# Patient Record
Sex: Female | Born: 1945
Health system: Southern US, Community
[De-identification: ages and names within clinical notes are randomized; demographics above are authoritative.]

## PROBLEM LIST (undated history)

## (undated) DIAGNOSIS — E7800 Pure hypercholesterolemia, unspecified: Secondary | ICD-10-CM

## (undated) HISTORY — DX: Pure hypercholesterolemia, unspecified: E78.00

## (undated) HISTORY — PX: APPENDECTOMY: SHX54

---

## 2013-08-31 HISTORY — PX: NECK SURGERY: SHX720

## 2014-07-11 DIAGNOSIS — Z8719 Personal history of other diseases of the digestive system: Secondary | ICD-10-CM

## 2014-07-11 HISTORY — DX: Personal history of other diseases of the digestive system: Z87.19

## 2015-05-29 HISTORY — PX: SPINE SURGERY: SHX786

## 2015-10-08 DIAGNOSIS — R5382 Chronic fatigue, unspecified: Secondary | ICD-10-CM

## 2015-10-08 DIAGNOSIS — I10 Essential (primary) hypertension: Secondary | ICD-10-CM

## 2015-10-08 DIAGNOSIS — E119 Type 2 diabetes mellitus without complications: Secondary | ICD-10-CM

## 2015-10-08 DIAGNOSIS — E559 Vitamin D deficiency, unspecified: Secondary | ICD-10-CM | POA: Insufficient documentation

## 2015-10-08 DIAGNOSIS — H409 Unspecified glaucoma: Secondary | ICD-10-CM

## 2015-10-08 DIAGNOSIS — F324 Major depressive disorder, single episode, in partial remission: Secondary | ICD-10-CM

## 2015-10-08 DIAGNOSIS — G4730 Sleep apnea, unspecified: Secondary | ICD-10-CM | POA: Insufficient documentation

## 2015-10-08 HISTORY — DX: Essential (primary) hypertension: I10

## 2015-10-08 HISTORY — DX: Major depressive disorder, single episode, in partial remission: F32.4

## 2015-10-08 HISTORY — DX: Unspecified glaucoma: H40.9

## 2015-10-08 HISTORY — DX: Vitamin D deficiency, unspecified: E55.9

## 2015-10-08 HISTORY — DX: Sleep apnea, unspecified: G47.30

## 2015-10-08 HISTORY — DX: Type 2 diabetes mellitus without complications: E11.9

## 2015-10-08 HISTORY — DX: Chronic fatigue, unspecified: R53.82

## 2016-10-23 DIAGNOSIS — R768 Other specified abnormal immunological findings in serum: Secondary | ICD-10-CM

## 2016-10-23 DIAGNOSIS — R002 Palpitations: Secondary | ICD-10-CM

## 2016-10-23 DIAGNOSIS — R9431 Abnormal electrocardiogram [ECG] [EKG]: Secondary | ICD-10-CM

## 2016-10-23 DIAGNOSIS — R7689 Other specified abnormal immunological findings in serum: Secondary | ICD-10-CM | POA: Insufficient documentation

## 2016-10-23 HISTORY — DX: Abnormal electrocardiogram (ECG) (EKG): R94.31

## 2016-10-23 HISTORY — DX: Other specified abnormal immunological findings in serum: R76.8

## 2016-10-23 HISTORY — DX: Palpitations: R00.2

## 2016-10-23 HISTORY — DX: Morbid (severe) obesity due to excess calories: E66.01

## 2016-11-10 DIAGNOSIS — E669 Obesity, unspecified: Secondary | ICD-10-CM

## 2016-11-10 HISTORY — DX: Obesity, unspecified: E66.9

## 2016-11-27 DIAGNOSIS — M170 Bilateral primary osteoarthritis of knee: Secondary | ICD-10-CM | POA: Insufficient documentation

## 2016-11-27 DIAGNOSIS — R700 Elevated erythrocyte sedimentation rate: Secondary | ICD-10-CM

## 2016-11-27 HISTORY — DX: Elevated erythrocyte sedimentation rate: R70.0

## 2016-11-27 HISTORY — DX: Bilateral primary osteoarthritis of knee: M17.0

## 2016-12-22 DIAGNOSIS — M25562 Pain in left knee: Secondary | ICD-10-CM

## 2016-12-22 HISTORY — DX: Pain in left knee: M25.562

## 2017-03-05 DIAGNOSIS — G4700 Insomnia, unspecified: Secondary | ICD-10-CM | POA: Insufficient documentation

## 2017-03-05 DIAGNOSIS — G4709 Other insomnia: Secondary | ICD-10-CM

## 2017-03-05 HISTORY — DX: Other insomnia: G47.09

## 2017-11-30 DIAGNOSIS — R339 Retention of urine, unspecified: Secondary | ICD-10-CM

## 2017-11-30 HISTORY — DX: Retention of urine, unspecified: R33.9

## 2018-12-14 ENCOUNTER — Telehealth: Payer: Self-pay | Admitting: Cardiology

## 2018-12-14 NOTE — Telephone Encounter (Signed)
Virtual Visit Pre-Appointment Phone Call    TELEPHONE CALL NOTE  Brenda NiemannCynthia Mcfarland has been deemed a candidate for a follow-up tele-health visit to limit community exposure during the Covid-19 pandemic. I spoke with the patient via phone to ensure availability of phone/video source, confirm preferred email & phone number, and discuss instructions and expectations.  I reminded Brenda NiemannCynthia Esbenshade to be prepared with any vital sign and/or heart rhythm information that could potentially be obtained via home monitoring, at the time of her visit. I reminded Brenda NiemannCynthia Gledhill to expect a phone call at the time of her visit if her visit.  Samella ParrDenise Wilson 12/14/2018 4:55 PM        FULL LENGTH CONSENT FOR TELE-HEALTH VISIT   I hereby voluntarily request, consent and authorize [facility] and its employed or contracted physicians, physician assistants, nurse practitioners or other licensed health care professionals (the Practitioner), to provide me with telemedicine health care services (the Services") as deemed necessary by the treating Practitioner. I acknowledge and consent to receive the Services by the Practitioner via telemedicine. I understand that the telemedicine visit will involve communicating with the Practitioner through live audiovisual communication technology and the disclosure of certain medical information by electronic transmission. I acknowledge that I have been given the opportunity to request an in-person assessment or other available alternative prior to the telemedicine visit and am voluntarily participating in the telemedicine visit.  I understand that I have the right to withhold or withdraw my consent to the use of telemedicine in the course of my care at any time, without affecting my right to future care or treatment, and that the Practitioner or I may terminate the telemedicine visit at any time. I understand that I have the right to inspect all information obtained and/or recorded  in the course of the telemedicine visit and may receive copies of available information for a reasonable fee.  I understand that some of the potential risks of receiving the Services via telemedicine include:   Delay or interruption in medical evaluation due to technological equipment failure or disruption;  Information transmitted may not be sufficient (e.g. poor resolution of images) to allow for appropriate medical decision making by the Practitioner; and/or   In rare instances, security protocols could fail, causing a breach of personal health information.  Furthermore, I acknowledge that it is my responsibility to provide information about my medical history, conditions and care that is complete and accurate to the best of my ability. I acknowledge that Practitioner's advice, recommendations, and/or decision may be based on factors not within their control, such as incomplete or inaccurate data provided by me or distortions of diagnostic images or specimens that may result from electronic transmissions. I understand that the practice of medicine is not an exact science and that Practitioner makes no warranties or guarantees regarding treatment outcomes. I acknowledge that I will receive a copy of this consent concurrently upon execution via email to the email address I last provided but may also request a printed copy by calling the office of [facility].    I understand that my insurance will be billed for this visit.   I have read or had this consent read to me.  I understand the contents of this consent, which adequately explains the benefits and risks of the Services being provided via telemedicine.   I have been provided ample opportunity to ask questions regarding this consent and the Services and have had my questions answered to my satisfaction.  I give my informed consent for the services to be provided through the use of telemedicine in my medical care  By participating in this  telemedicine visit I agree to the above.  YES      Cardiac Questionnaire:    Since your last visit or hospitalization:    1. Have you been having new or worsening chest pain? NO   2. Have you been having new or worsening shortness of breath? NO 3. Have you been having new or worsening leg swelling, wt gain, or increase in abdominal girth (pants fitting more tightly)? NO   4. Have you had any passing out spells? NO   *A YES to any of these questions would result in the appointment being kept. *If all the answers to these questions are NO, we should indicate that given the current situation regarding the worldwide coronarvirus pandemic, at the recommendation of the CDC, we are looking to limit gatherings in our waiting area, and thus will reschedule their appointment beyond four weeks from today.   _____________   COVID-19 Pre-Screening Questions:   Do you currently have a fever? NO (yes = cancel and refer to pcp for e-visit)  Have you recently travelled on a cruise, internationally, or to [place], [place], [place], [place], [place], or [place], [place] [employer]) ?NO(yes = cancel, stay home, monitor symptoms, and contact pcp or initiate e-visit if symptoms develop)  Have you been in contact with someone that is currently pending confirmation of Covid19 testing or has been confirmed to have the Covid19 virus?  NO (yes = cancel, stay home, away from tested individual, monitor symptoms, and contact pcp or initiate e-visit if symptoms develop)  Are you currently experiencing fatigue or cough? NO (yes = pt should be prepared to have a mask placed at the time of their visit).

## 2018-12-16 ENCOUNTER — Other Ambulatory Visit: Payer: Self-pay

## 2018-12-16 ENCOUNTER — Encounter: Payer: Self-pay | Admitting: Cardiology

## 2018-12-16 ENCOUNTER — Telehealth (INDEPENDENT_AMBULATORY_CARE_PROVIDER_SITE_OTHER): Payer: Medicare Other | Admitting: Cardiology

## 2018-12-16 DIAGNOSIS — R350 Frequency of micturition: Secondary | ICD-10-CM

## 2018-12-16 DIAGNOSIS — G4733 Obstructive sleep apnea (adult) (pediatric): Secondary | ICD-10-CM

## 2018-12-16 DIAGNOSIS — I10 Essential (primary) hypertension: Secondary | ICD-10-CM | POA: Diagnosis not present

## 2018-12-16 DIAGNOSIS — E119 Type 2 diabetes mellitus without complications: Secondary | ICD-10-CM | POA: Insufficient documentation

## 2018-12-16 HISTORY — DX: Obstructive sleep apnea (adult) (pediatric): G47.33

## 2018-12-16 HISTORY — DX: Frequency of micturition: R35.0

## 2018-12-16 HISTORY — DX: Type 2 diabetes mellitus without complications: E11.9

## 2018-12-16 NOTE — Progress Notes (Signed)
Virtual Visit via Video Note   This visit type was conducted due to national recommendations for restrictions regarding the COVID-19 Pandemic (e.g. social distancing) in an effort to limit this patient's exposure and mitigate transmission in our community.  Due to her co-morbid illnesses, this patient is at least at moderate risk for complications without adequate follow up.  This format is felt to be most appropriate for this patient at this time.  All issues noted in this document were discussed and addressed.  A limited physical exam was performed with this format.  Please refer to the patient's chart for her consent to telehealth for [facility].  Evaluation Performed:  Follow-up visit  This visit type was conducted due to national recommendations for restrictions regarding the COVID-19 Pandemic (e.g. social distancing).  This format is felt to be most appropriate for this patient at this time.  All issues noted in this document were discussed and addressed.  No physical exam was performed (except for noted visual exam findings with Video Visits).  Please refer to the patient's chart (MyChart message for video visits and phone note for telephone visits) for the patient's consent to telehealth for [facility]  Date:  12/16/2018  ID: Brenda Mcfarland, DOB 1946/07/05, MRN [id number]   Patient Location: [address] [place] [address]   Provider location:   [facility] [place] [facility]  PCP:  Laqueta Due., MD  Cardiologist:  Gypsy Balsam, MD     Chief Complaint: I have problem with my high blood pressure  History of Present Illness:    Brenda Mcfarland is a 73 y.o. female  who presents via audio/video conferencing for a telehealth visit today.  She has history of hypertension for years initially fairly easy controlled she was able to take some medication with some difficulties but blood pressure was controlled, however, lately blood pressure became difficult to control.  She  tried different kind of medication she was taking olmesartan with relatively good response however this medication started being difficult to obtain therefore she was switched to losartan and since that time she started having some issues she complained of having frequent urination and she thinks is related to losartan.  Then she was switched to lisinopril and similar situation continue she described to have frequent need to urinate.  That bothers her a lot she has to get up many times during the night cannot get good sleep.  She complained of feeling weak tired and exhausted and partially she thinks related to her blood pressure medications.  She reads a lot about this medication she read all side effects and she is worried about potential side effects of those medications.  She tried to do some essential oil and she said it works much better than medications. Had any heart trouble.  She used to be athletic however because of some surgeries that she suffer she cannot exercise on the regular basis now. Quit smoking many years ago.  Does have family history of premature coronary artery disease.   The patient does not have symptoms concerning for COVID-19 infection (fever, chills, cough, or new SHORTNESS OF BREATH).    Prior CV studies:   The following studies were reviewed today:       No past medical history on file.     No outpatient medications have been marked as taking for the 12/16/18 encounter (Telemedicine) with Georgeanna Lea, MD.      Family History: The patient's family history is not on  file.   ROS:   Please see the history of present illness.     All other systems reviewed and are negative.   Labs/Other Tests and Data Reviewed:     Recent Labs: No results found for requested labs within last 8760 hours.  Recent Lipid Panel No results found for: CHOL, TRIG, HDL, CHOLHDL, VLDL, LDLCALC, LDLDIRECT    Exam:    Vital Signs:  BP (!) 148/87   Pulse 92   Ht 5'  7" (1.702 m)   Wt 215 lb (97.5 kg)   BMI 33.67 kg/m     Wt Readings from Last 3 Encounters:  12/16/18 215 lb (97.5 kg)     Well nourished, well developed in no acute distress. On talking to her to the video link she is alert awake oriented x3 sitting in her living room no JVD no swelling of lower extremities quite happy to be able to talk to me.  Diagnosis for this visit:   1. Essential hypertension   2. Frequent urination   3. Obstructive sleep apnea   4. Type 2 diabetes mellitus without complication, without long-term current use of insulin (HCC)      ASSESSMENT & PLAN:    1.  Essential hypertension which appears to be somewhat difficult to control I suspect there was significant psychological factors there as well.  I told her simply to check her blood pressure for the next few days I asked her to do it 3 times a day.  I want her to sit down and relax and then check her blood pressure also told her not to check blood pressure first thing in the morning.  I we also spent a great deal of time talking about nonpharmacological ways to reduce her blood pressure.  We were talking about low-salt diet we talked about exercise we talked about weight loss with talking about taking care of her sleep apnea.  She is excited about that she is willing to try all those measures.  For now I asked her to to hold on lisinopril and she already stopped 2 days ago anyway check her blood pressure on regular basis and will continue this conversation at the beginning of next week. 2.  Frequent urination I doubt very much that this related to losartan or lisinopril however she is convinced that is what it is.  Again the plan will be to simply stop those medications and see how she does and see if she still have some problems. 3.  Obstructive sleep apnea she does not use mask she said she lost about 50 pounds in since that time she does not use mask however now she gained some of this weight back we will talk about  potentially starting back using CPAP mask she said the fact that she have to wake up so frequently during the night and urinate make her difficult to put the mask on.  Again this is discussion that we will continue 4.  Type 2 diabetes.  She is controlling this with essential oil. Difficult situation will try to help her the best we can.  I talked to her again next week.  COVID-19 Education: The signs and symptoms of COVID-19 were discussed with the patient and how to seek care for testing (follow up with PCP or arrange E-visit).  The importance of social distancing was discussed today.  Patient Risk:   After full review of this patients clinical status, I feel that they are at least moderate risk at  this time.  Time:   Today, I have spent 27 minutes with the patient with telehealth technology discussing pt health issues.  I spent reviewing her chart before the visit.  Visit was finished at 1:47 PM.    Medication Adjustments/Labs and Tests Ordered: Current medicines are reviewed at length with the patient today.  Concerns regarding medicines are outlined above.  No orders of the defined types were placed in this encounter.  Medication changes: No orders of the defined types were placed in this encounter.    Disposition: Follow-up next week  Signed, Georgeanna Lea, MD, [facility] 12/16/2018 3:06 PM    [facility]

## 2018-12-16 NOTE — Patient Instructions (Signed)
Medication Instructions:  Your physician recommends that you continue on your current medications as directed. Please refer to the Current Medication list given to you today.  If you need a refill on your cardiac medications before your next appointment, please call your pharmacy.   Lab work: None.  If you have labs (blood work) drawn today and your tests are completely normal, you will receive your results only by: Marland Kitchen MyChart Message (if you have MyChart) OR . A paper copy in the mail If you have any lab test that is abnormal or we need to change your treatment, we will call you to review the results.  Testing/Procedures: None.   Follow-Up: At [facility], you and your health needs are our priority.  As part of our continuing mission to provide you with exceptional heart care, we have created designated Provider Care Teams.  These Care Teams include your primary Cardiologist (physician) and Advanced Practice Providers (APPs -  Physician Assistants and Nurse Practitioners) who all work together to provide you with the care you need, when you need it. You will need a follow up appointment in 4 days.  Please call our office 2 months in advance to schedule this appointment.  You may see No primary care provider on file. or another member of our [employer] Provider Team in [place]: Norman Herrlich, MD . Belva Crome, MD  Any Other Special Instructions Will Be Listed Below (If Applicable).

## 2018-12-22 ENCOUNTER — Other Ambulatory Visit: Payer: Self-pay

## 2018-12-22 ENCOUNTER — Telehealth (INDEPENDENT_AMBULATORY_CARE_PROVIDER_SITE_OTHER): Payer: Medicare Other | Admitting: Cardiology

## 2018-12-22 ENCOUNTER — Encounter: Payer: Self-pay | Admitting: Cardiology

## 2018-12-22 VITALS — BP 153/87 | HR 94 | Wt 213.0 lb

## 2018-12-22 DIAGNOSIS — I10 Essential (primary) hypertension: Secondary | ICD-10-CM

## 2018-12-22 DIAGNOSIS — G4733 Obstructive sleep apnea (adult) (pediatric): Secondary | ICD-10-CM

## 2018-12-22 DIAGNOSIS — E119 Type 2 diabetes mellitus without complications: Secondary | ICD-10-CM

## 2018-12-22 DIAGNOSIS — R350 Frequency of micturition: Secondary | ICD-10-CM

## 2018-12-22 NOTE — Patient Instructions (Signed)
Medication Instructions: Your physician recommends that you continue on your current medications as directed. Please refer to the Current Medication list given to you today.  If you need a refill on your cardiac medications before your next appointment, please call your pharmacy.   Lab work: None If you have labs (blood work) drawn today and your tests are completely normal, you will receive your results only by: . MyChart Message (if you have MyChart) OR . A paper copy in the mail If you have any lab test that is abnormal or we need to change your treatment, we will call you to review the results.  Testing/Procedures: None  Follow-Up: At [facility], you and your health needs are our priority.  As part of our continuing mission to provide you with exceptional heart care, we have created designated Provider Care Teams.  These Care Teams include your primary Cardiologist (physician) and Advanced Practice Providers (APPs -  Physician Assistants and Nurse Practitioners) who all work together to provide you with the care you need, when you need it. You will need a follow up appointment in 2 weeks.   Any Other Special Instructions Will Be Listed Below (If Applicable).    

## 2018-12-22 NOTE — Progress Notes (Signed)
Virtual Visit via Video Note   This visit type was conducted due to national recommendations for restrictions regarding the COVID-19 Pandemic (e.g. social distancing) in an effort to limit this patient's exposure and mitigate transmission in our community.  Due to her co-morbid illnesses, this patient is at least at moderate risk for complications without adequate follow up.  This format is felt to be most appropriate for this patient at this time.  All issues noted in this document were discussed and addressed.  A limited physical exam was performed with this format.  Please refer to the patient's chart for her consent to telehealth for [facility].  Evaluation Performed:  Follow-up visit  This visit type was conducted due to national recommendations for restrictions regarding the COVID-19 Pandemic (e.g. social distancing).  This format is felt to be most appropriate for this patient at this time.  All issues noted in this document were discussed and addressed.  No physical exam was performed (except for noted visual exam findings with Video Visits).  Please refer to the patient's chart (MyChart message for video visits and phone note for telephone visits) for the patient's consent to telehealth for [facility].  Date:  12/22/2018  ID: Brenda Mcfarland, DOB December 18, 1945, MRN [id number]   Patient Location: [address] [place] [address]   Provider location:   [facility] [place] [facility]  PCP:  Laqueta Due., MD  Cardiologist:  Gypsy Balsam, MD     Chief Complaint: Doing fair  History of Present Illness:    Brenda Mcfarland is a 73 y.o. female  who presents via audio/video conferencing for a telehealth visit today.  Who was referred to me because of hypertension that appears to be difficult to control the issue however is much deeper I suspect there is some significant psychological factor here.  Last time what I did I asked her to stop lisinopril.  She told that lisinopril make  her to get up many times during the night and urinate she was convinced that this is what caused the trouble so I simply asked her to stop lisinopril and asked her to check her blood pressure.  She check her blood pressure on the regular basis and usual numbers are between 145 systolic 155.  1 time she got some strange mitigating her blood pressure was 109/98 I told her this is impossible number and I told her in the future if she has numbers like this she need to simply recheck it.  Last time we spent also a lot of time talking about nonpharmacological measures that can be used to reduce her blood pressure.  We were talking about low-salt we talked about exercises we talked about weight loss.  We also talked about relaxation technique.  She was very eager to try it.  And overall she said she feeling good however few days ago she ordered pizza and ate pizza for next 3 days since that time she is not feeling well.  She thinks this is infection of Candida that she had before that is acting up.  Complaining of being weak tired and exhausted.  Also initially when she stopped lisinopril she said she got very good nights and she could sleep all through the night with no difficulties she did not have to get up in the middle of night to urinate which confirmed her.  That her frequent urination was related to lisinopril.  Patient returned.  She thinks this is because of pizza as well  as the fact that she drank more fluids.  She also blamed the situation the fact that she does not use essential oils the way she should.  We beginning conversation about potentially starting some medication to reducing blood pressure.  She does not want to do it right now she said she will not stick more to the diet as well as using her essential oil to see if it helps with her blood pressure.  I am fine with this.  However I told her that we will contact again in about 2 weeks and continue to this conversation I am afraid at that time will have  to initiate some medication.   The patient does not have symptoms concerning for COVID-19 infection (fever, chills, cough, or new SHORTNESS OF BREATH).    Prior CV studies:   The following studies were reviewed today:       No past medical history on file.     Current Meds  Medication Sig  . lisinopril (ZESTRIL) 10 MG tablet Take 10 mg by mouth daily.  . temazepam (RESTORIL) 15 MG capsule Take 1 capsule by mouth at bedtime as needed for sleep.      Family History: The patient's family history is not on file.   ROS:   Please see the history of present illness.     All other systems reviewed and are negative.   Labs/Other Tests and Data Reviewed:     Recent Labs: No results found for requested labs within last 8760 hours.  Recent Lipid Panel No results found for: CHOL, TRIG, HDL, CHOLHDL, VLDL, LDLCALC, LDLDIRECT    Exam:    Vital Signs:  BP (!) 153/87   Pulse 94   Wt 213 lb (96.6 kg)   BMI 33.36 kg/m     Wt Readings from Last 3 Encounters:  12/22/18 213 lb (96.6 kg)  12/16/18 215 lb (97.5 kg)     Well nourished, well developed in no acute distress. Alert awake and at the time 3.  We talked to the video link.  She is happy to be able to talk to me she said she does not feel well today and she blames it on pizza that she ate.  There is no JVD there is no swelling of lower extremities  Diagnosis for this visit:   1. Essential hypertension   2. Type 2 diabetes mellitus without complication, without long-term current use of insulin (HCC)   3. Frequent urination   4. Obstructive sleep apnea      ASSESSMENT & PLAN:    1.  Essential hypertension: Discussion as above. 2.  Type 2 diabetes followed by primary care physician apparently lately better control. 3.  Frequent urination she thinks is related to lisinopril I am not sure if this is the reason for it.  Lisinopril has been discontinued and she said that she is feeling better from that aspect. 4.   Obstructive sleep apnea we had a very conversation about this and I will try to convince her that she will use or her CPAP mask the way she should.  COVID-19 Education: The signs and symptoms of COVID-19 were discussed with the patient and how to seek care for testing (follow up with PCP or arrange E-visit).  The importance of social distancing was discussed today.  Patient Risk:   After full review of this patients clinical status, I feel that they are at least moderate risk at this time.  Time:   Today, I have  spent 16 minutes with the patient with telehealth technology discussing pt health issues.  I spent 5 minutes reviewing her chart before the visit.  Visit was finished at 11:24 AM.    Medication Adjustments/Labs and Tests Ordered: Current medicines are reviewed at length with the patient today.  Concerns regarding medicines are outlined above.  No orders of the defined types were placed in this encounter.  Medication changes: No orders of the defined types were placed in this encounter.    Disposition: Follow-up in 2 weeks  Signed, Georgeanna Lea, MD, [facility] 12/22/2018 11:26 AM    [facility]

## 2019-01-06 ENCOUNTER — Other Ambulatory Visit: Payer: Self-pay

## 2019-01-06 ENCOUNTER — Encounter: Payer: Self-pay | Admitting: Cardiology

## 2019-01-06 ENCOUNTER — Telehealth (INDEPENDENT_AMBULATORY_CARE_PROVIDER_SITE_OTHER): Payer: Medicare Other | Admitting: Cardiology

## 2019-01-06 VITALS — BP 155/92 | HR 92

## 2019-01-06 DIAGNOSIS — I10 Essential (primary) hypertension: Secondary | ICD-10-CM | POA: Diagnosis not present

## 2019-01-06 DIAGNOSIS — R350 Frequency of micturition: Secondary | ICD-10-CM

## 2019-01-06 DIAGNOSIS — E119 Type 2 diabetes mellitus without complications: Secondary | ICD-10-CM | POA: Diagnosis not present

## 2019-01-06 MED ORDER — AMLODIPINE BESYLATE 2.5 MG PO TABS: 2.5000 mg | ORAL_TABLET | Freq: Every day | ORAL | 1 refills | Status: DC

## 2019-01-06 NOTE — Progress Notes (Signed)
Virtual Visit via Video Note   This visit type was conducted due to national recommendations for restrictions regarding the COVID-19 Pandemic (e.g. social distancing) in an effort to limit this patient's exposure and mitigate transmission in our community.  Due to her co-morbid illnesses, this patient is at least at moderate risk for complications without adequate follow up.  This format is felt to be most appropriate for this patient at this time.  All issues noted in this document were discussed and addressed.  A limited physical exam was performed with this format.  Please refer to the patient's chart for her consent to telehealth for [facility].  Evaluation Performed:  Follow-up visit  This visit type was conducted due to national recommendations for restrictions regarding the COVID-19 Pandemic (e.g. social distancing).  This format is felt to be most appropriate for this patient at this time.  All issues noted in this document were discussed and addressed.  No physical exam was performed (except for noted visual exam findings with Video Visits).  Please refer to the patient's chart (MyChart message for video visits and phone note for telephone visits) for the patient's consent to telehealth for [facility].  Date:  01/06/2019  ID: Brenda Mcfarland, DOB 30-May-1946, MRN [id number]   Patient Location: [address] [place] [address]   Provider location:   [facility] [place] [facility]  PCP:  Laqueta Due., MD  Cardiologist:  Gypsy Balsam, MD     Chief Complaint: I am weak and tired  History of Present Illness:    Brenda Mcfarland is a 73 y.o. female  who presents via audio/video conferencing for a telehealth visit today.  With hypertension which is difficult to control she does have difficult time to tolerating medications.  Denies having any chest pain, tightness, pressure, burning, squeezing in the chest just fatigue and tired.  She said she wakes up in the morning tired but  force herself to get up and work.  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  Yesterday she said she did not do much.  Still blood pressure is elevated systolic typically 150 there were a few times that she measured with her high blood pressure.  Clearly she needs to be on medications.  Finally she today agreed to take some medication I put her on Norvasc amlodipine 2.5 mg daily and see how she can tolerate it.   The patient does not have symptoms concerning for COVID-19 infection (fever, chills, cough, or new SHORTNESS OF BREATH).    Prior CV studies:   The following studies were reviewed today:       No past medical history on file.     Current Meds  Medication Sig  . lisinopril (ZESTRIL) 10 MG tablet Take 10 mg by mouth daily.  . temazepam (RESTORIL) 15 MG capsule Take 1 capsule by mouth at bedtime as needed for sleep.      Family History: The patient's family history is not on file.   ROS:   Please see the history of present illness.     All other systems reviewed and are negative.   Labs/Other Tests and Data Reviewed:     Recent Labs: No results found for requested labs within last 8760 hours.  Recent Lipid Panel No results found for: CHOL, TRIG, HDL, CHOLHDL, VLDL, LDLCALC, LDLDIRECT    Exam:    Vital Signs:  BP (!) 155/92   Pulse 92  Wt Readings from Last 3 Encounters:  12/22/18 213 lb (96.6 kg)  12/16/18 215 lb (97.5 kg)     Well nourished, well developed in no acute distress. Alert awake oriented x3 and again we had a long conversation over the video link.  I am worried that she may be somewhat depressed and that is why she got lack of energy.  She will be scheduled to have echocardiogram to assess left ventricular ejection fraction.  Diagnosis for this visit:   1. Essential hypertension   2. Type 2 diabetes mellitus without complication, without long-term current use of insulin (HCC)   3. Frequent urination       ASSESSMENT & PLAN:    1.  Essential hypertension she agreed to try small dose of amlodipine which will be only 2.5 mg daily. 2.  Fatigue and tiredness.  Echocardiogram will be done to assess left ventricular ejection fraction. 3.  Type 2 diabetes apparently stable. 4.  Frequent urination.  Doing much better from that point of view still have difficulty sleeping at night which is a chronic problem she does have chronic insomnia.  COVID-19 Education: The signs and symptoms of COVID-19 were discussed with the patient and how to seek care for testing (follow up with PCP or arrange E-visit).  The importance of social distancing was discussed today.  Patient Risk:   After full review of this patients clinical status, I feel that they are at least moderate risk at this time.  Time:   Today, I have spent 20 minutes with the patient with telehealth technology discussing pt health issues.  I spent 5 minutes reviewing her chart before the visit.  Visit was finished at 2:31 PM.    Medication Adjustments/Labs and Tests Ordered: Current medicines are reviewed at length with the patient today.  Concerns regarding medicines are outlined above.  No orders of the defined types were placed in this encounter.  Medication changes: No orders of the defined types were placed in this encounter.    Disposition: Follow-up in 3 weeks.  Will do echocardiogram  Signed, Georgeanna Leaobert J. Cooper Moroney, MD, [facility] 01/06/2019 2:31 PM    [facility]

## 2019-01-06 NOTE — Patient Instructions (Signed)
Medication Instructions:  Your physician has recommended you make the following change in your medication:   START: Amlodipine 2.5 mg daily  If you need a refill on your cardiac medications before your next appointment, please call your pharmacy.   Lab work: None. If you have labs (blood work) drawn today and your tests are completely normal, you will receive your results only by: Marland Kitchen MyChart Message (if you have MyChart) OR . A paper copy in the mail If you have any lab test that is abnormal or we need to change your treatment, we will call you to review the results.  Testing/Procedures: Your physician has requested that you have an echocardiogram. Echocardiography is a painless test that uses sound waves to create images of your heart. It provides your doctor with information about the size and shape of your heart and how well your heart's chambers and valves are working. This procedure takes approximately one hour. There are no restrictions for this procedure.    Follow-Up: At [facility], you and your health needs are our priority.  As part of our continuing mission to provide you with exceptional heart care, we have created designated Provider Care Teams.  These Care Teams include your primary Cardiologist (physician) and Advanced Practice Providers (APPs -  Physician Assistants and Nurse Practitioners) who all work together to provide you with the care you need, when you need it. You will need a follow up appointment in 3 weeks.  Please call our office 2 months in advance to schedule this appointment.  You may see No primary care provider on file. or another member of our [employer] Provider Team in [place]: Norman Herrlich, MD . Belva Crome, MD  Any Other Special Instructions Will Be Listed Below (If Applicable).  Amlodipine tablets What is this medicine? AMLODIPINE (am LOE di peen) is a calcium-channel blocker. It affects the amount of calcium found in your heart and  muscle cells. This relaxes your blood vessels, which can reduce the amount of work the heart has to do. This medicine is used to lower high blood pressure. It is also used to prevent chest pain. This medicine may be used for other purposes; ask your health care provider or pharmacist if you have questions. COMMON BRAND NAME(S): Norvasc What should I tell my health care provider before I take this medicine? They need to know if you have any of these conditions: -heart disease -liver disease -an unusual or allergic reaction to amlodipine, other medicines, foods, dyes, or preservatives -pregnant or trying to get pregnant -breast-feeding How should I use this medicine? Take this medicine by mouth with a glass of water. Follow the directions on the prescription label. You can take it with or without food. If it upsets your stomach, take it with food. Take your medicine at regular intervals. Do not take it more often than directed. Do not stop taking except on your doctor's advice. Talk to your pediatrician regarding the use of this medicine in children. While this drug may be prescribed for children as young as 6 years for selected conditions, precautions do apply. Patients over 69 years of age may have a stronger reaction and need a smaller dose. Overdosage: If you think you have taken too much of this medicine contact a poison control center or emergency room at once. NOTE: This medicine is only for you. Do not share this medicine with others. What if I miss a dose? If you miss a dose, take it as soon  as you can. If it is almost time for your next dose, take only that dose. Do not take double or extra doses. What may interact with this medicine? Do not take this medicine with any of the following medications: -tranylcypromine This medicine may also interact with the following medications: -clarithromycin -cyclosporine -diltiazem -itraconazole -simvastatin -tacrolimus This list may not  describe all possible interactions. Give your health care provider a list of all the medicines, herbs, non-prescription drugs, or dietary supplements you use. Also tell them if you smoke, drink alcohol, or use illegal drugs. Some items may interact with your medicine. What should I watch for while using this medicine? Visit your healthcare professional for regular checks on your progress. Check your blood pressure as directed. Ask your healthcare professional what your blood pressure should be and when you should contact him or her. Do not treat yourself for coughs, colds, or pain while you are using this medicine without asking your healthcare professional for advice. Some medicines may increase your blood pressure. You may get dizzy. Do not drive, use machinery, or do anything that needs mental alertness until you know how this medicine affects you. Do not stand or sit up quickly, especially if you are an older patient. This reduces the risk of dizzy or fainting spells. Avoid alcoholic drinks; they can make you dizzier. What side effects may I notice from receiving this medicine? Side effects that you should report to your doctor or health care professional as soon as possible: -allergic reactions like skin rash, itching or hives; swelling of the face, lips, or tongue -fast, irregular heartbeat -signs and symptoms of low blood pressure like dizziness; feeling faint or lightheaded, falls; unusually weak or tired -swelling of ankles, feet, hands Side effects that usually do not require medical attention (report these to your doctor or health care professional if they continue or are bothersome): -dry mouth -facial flushing -headache -stomach pain -tiredness This list may not describe all possible side effects. Call your doctor for medical advice about side effects. You may report side effects to FDA at 1-800-FDA-1088. Where should I keep my medicine? Keep out of the reach of children. Store at  room temperature between 59 and 86 degrees F (15 and 30 degrees C). Throw away any unused medicine after the expiration date. NOTE: This sheet is a summary. It may not cover all possible information. If you have questions about this medicine, talk to your doctor, pharmacist, or health care provider.  2019 Elsevier/Gold Standard (2018-03-11 15:07:10)   Echocardiogram An echocardiogram is a procedure that uses painless sound waves (ultrasound) to produce an image of the heart. Images from an echocardiogram can provide important information about:  Signs of coronary artery disease (CAD).  Aneurysm detection. An aneurysm is a weak or damaged part of an artery wall that bulges out from the normal force of blood pumping through the body.  Heart size and shape. Changes in the size or shape of the heart can be associated with certain conditions, including heart failure, aneurysm, and CAD.  Heart muscle function.  Heart valve function.  Signs of a past heart attack.  Fluid buildup around the heart.  Thickening of the heart muscle.  A tumor or infectious growth around the heart valves. Tell a health care provider about:  Any allergies you have.  All medicines you are taking, including vitamins, herbs, eye drops, creams, and over-the-counter medicines.  Any blood disorders you have.  Any surgeries you have had.  Any medical conditions  you have.  Whether you are pregnant or may be pregnant. What are the risks? Generally, this is a safe procedure. However, problems may occur, including:  Allergic reaction to dye (contrast) that may be used during the procedure. What happens before the procedure? No specific preparation is needed. You may eat and drink normally. What happens during the procedure?   An IV tube may be inserted into one of your veins.  You may receive contrast through this tube. A contrast is an injection that improves the quality of the pictures from your heart.   A gel will be applied to your chest.  A wand-like tool (transducer) will be moved over your chest. The gel will help to transmit the sound waves from the transducer.  The sound waves will harmlessly bounce off of your heart to allow the heart images to be captured in real-time motion. The images will be recorded on a computer. The procedure may vary among health care providers and hospitals. What happens after the procedure?  You may return to your normal, everyday life, including diet, activities, and medicines, unless your health care provider tells you not to do that. Summary  An echocardiogram is a procedure that uses painless sound waves (ultrasound) to produce an image of the heart.  Images from an echocardiogram can provide important information about the size and shape of your heart, heart muscle function, heart valve function, and fluid buildup around your heart.  You do not need to do anything to prepare before this procedure. You may eat and drink normally.  After the echocardiogram is completed, you may return to your normal, everyday life, unless your health care provider tells you not to do that. This information is not intended to replace advice given to you by your health care provider. Make sure you discuss any questions you have with your health care provider. Document Released: 08/14/2000 Document Revised: 09/19/2016 Document Reviewed: 09/19/2016 Elsevier Interactive Patient Education  2019 Elsevier Inc.   Amlodipine tablets What is this medicine? AMLODIPINE (am LOE di peen) is a calcium-channel blocker. It affects the amount of calcium found in your heart and muscle cells. This relaxes your blood vessels, which can reduce the amount of work the heart has to do. This medicine is used to lower high blood pressure. It is also used to prevent chest pain. This medicine may be used for other purposes; ask your health care provider or pharmacist if you have questions. COMMON  BRAND NAME(S): Norvasc What should I tell my health care provider before I take this medicine? They need to know if you have any of these conditions: -heart disease -liver disease -an unusual or allergic reaction to amlodipine, other medicines, foods, dyes, or preservatives -pregnant or trying to get pregnant -breast-feeding How should I use this medicine? Take this medicine by mouth with a glass of water. Follow the directions on the prescription label. You can take it with or without food. If it upsets your stomach, take it with food. Take your medicine at regular intervals. Do not take it more often than directed. Do not stop taking except on your doctor's advice. Talk to your pediatrician regarding the use of this medicine in children. While this drug may be prescribed for children as young as 6 years for selected conditions, precautions do apply. Patients over 74 years of age may have a stronger reaction and need a smaller dose. Overdosage: If you think you have taken too much of this medicine contact a poison control  center or emergency room at once. NOTE: This medicine is only for you. Do not share this medicine with others. What if I miss a dose? If you miss a dose, take it as soon as you can. If it is almost time for your next dose, take only that dose. Do not take double or extra doses. What may interact with this medicine? Do not take this medicine with any of the following medications: -tranylcypromine This medicine may also interact with the following medications: -clarithromycin -cyclosporine -diltiazem -itraconazole -simvastatin -tacrolimus This list may not describe all possible interactions. Give your health care provider a list of all the medicines, herbs, non-prescription drugs, or dietary supplements you use. Also tell them if you smoke, drink alcohol, or use illegal drugs. Some items may interact with your medicine. What should I watch for while using this medicine?  Visit your healthcare professional for regular checks on your progress. Check your blood pressure as directed. Ask your healthcare professional what your blood pressure should be and when you should contact him or her. Do not treat yourself for coughs, colds, or pain while you are using this medicine without asking your healthcare professional for advice. Some medicines may increase your blood pressure. You may get dizzy. Do not drive, use machinery, or do anything that needs mental alertness until you know how this medicine affects you. Do not stand or sit up quickly, especially if you are an older patient. This reduces the risk of dizzy or fainting spells. Avoid alcoholic drinks; they can make you dizzier. What side effects may I notice from receiving this medicine? Side effects that you should report to your doctor or health care professional as soon as possible: -allergic reactions like skin rash, itching or hives; swelling of the face, lips, or tongue -fast, irregular heartbeat -signs and symptoms of low blood pressure like dizziness; feeling faint or lightheaded, falls; unusually weak or tired -swelling of ankles, feet, hands Side effects that usually do not require medical attention (report these to your doctor or health care professional if they continue or are bothersome): -dry mouth -facial flushing -headache -stomach pain -tiredness This list may not describe all possible side effects. Call your doctor for medical advice about side effects. You may report side effects to FDA at 1-800-FDA-1088. Where should I keep my medicine? Keep out of the reach of children. Store at room temperature between 59 and 86 degrees F (15 and 30 degrees C). Throw away any unused medicine after the expiration date. NOTE: This sheet is a summary. It may not cover all possible information. If you have questions about this medicine, talk to your doctor, pharmacist, or health care provider.  2019  Elsevier/Gold Standard (2018-03-11 15:07:10)

## 2019-01-06 NOTE — Addendum Note (Signed)
Addended by: Lita Mains on: 01/06/2019 02:49 PM   Modules accepted: Orders

## 2019-01-10 ENCOUNTER — Ambulatory Visit: Payer: BC Managed Care – PPO | Admitting: Cardiology

## 2019-01-11 ENCOUNTER — Telehealth: Payer: Self-pay | Admitting: Cardiology

## 2019-01-11 NOTE — Telephone Encounter (Signed)
Patient reports feeling strange today. She feels dizzy, trembly on the inside, she can feel her heart beating in her face and  like she can't get a deep breath. Her neck and chest feel tight for 1 1/2  hours. She denies edema, BP now 143/89, HR 96, weight 217#. BP earlier today 151/90, HR 98.     She resumed norvasc 2.5mg  3 days ago, otherwise is on no other cardiac meds.     Recommended that she go to Urgent Care for EKG but she stated this might be anxiety.   pls advise, tx

## 2019-01-11 NOTE — Telephone Encounter (Signed)
Please stay on norvasc if she can. Try relaxation techniques, walk around, listen to music and see if that will continue. Please ask her to keep [place] updated

## 2019-01-11 NOTE — Telephone Encounter (Signed)
Phoned patient, informed that Dr. Bing Matter would like her to stay on the norvasc, he'd like her to try relaxation and stress reduction techniques as mentioned above. Patient agrees and will call us to update how she's doing as needed.

## 2019-01-11 NOTE — Telephone Encounter (Signed)
Brenda Mcfarland 1946-03-17 -- is not feeling well, feels shaky, unstable, chest pressure, and dizzy

## 2019-01-24 ENCOUNTER — Encounter: Payer: Self-pay | Admitting: Cardiology

## 2019-01-24 ENCOUNTER — Other Ambulatory Visit: Payer: Self-pay

## 2019-01-24 ENCOUNTER — Telehealth (INDEPENDENT_AMBULATORY_CARE_PROVIDER_SITE_OTHER): Payer: Medicare Other | Admitting: Cardiology

## 2019-01-24 VITALS — BP 157/81 | HR 88 | Wt 213.0 lb

## 2019-01-24 DIAGNOSIS — E119 Type 2 diabetes mellitus without complications: Secondary | ICD-10-CM | POA: Diagnosis not present

## 2019-01-24 DIAGNOSIS — I10 Essential (primary) hypertension: Secondary | ICD-10-CM | POA: Diagnosis not present

## 2019-01-24 DIAGNOSIS — N951 Menopausal and female climacteric states: Secondary | ICD-10-CM

## 2019-01-24 DIAGNOSIS — R350 Frequency of micturition: Secondary | ICD-10-CM

## 2019-01-24 DIAGNOSIS — R002 Palpitations: Secondary | ICD-10-CM

## 2019-01-24 MED ORDER — AMLODIPINE BESYLATE 5 MG PO TABS: 5.0000 mg | ORAL_TABLET | Freq: Every day | ORAL | 1 refills | Status: DC

## 2019-01-24 NOTE — Patient Instructions (Signed)
Medication Instructions:  Your physician has recommended you make the following change in your medication:   Increase: amlodipine to 5 mg daily   If you need a refill on your cardiac medications before your next appointment, please call your pharmacy.   Lab work: None.  If you have labs (blood work) drawn today and your tests are completely normal, you will receive your results only by: Marland Kitchen MyChart Message (if you have MyChart) OR . A paper copy in the mail If you have any lab test that is abnormal or we need to change your treatment, we will call you to review the results.  Testing/Procedures: Your physician has recommended that you wear a holter monitor. Holter monitors are medical devices that record the heart's electrical activity. Doctors most often use these monitors to diagnose arrhythmias. Arrhythmias are problems with the speed or rhythm of the heartbeat. The monitor is a small, portable device. You can wear one while you do your normal daily activities. This is usually used to diagnose what is causing palpitations/syncope (passing out). Wear for 7 days.    Follow-Up: At [facility], you and your health needs are our priority.  As part of our continuing mission to provide you with exceptional heart care, we have created designated Provider Care Teams.  These Care Teams include your primary Cardiologist (physician) and Advanced Practice Providers (APPs -  Physician Assistants and Nurse Practitioners) who all work together to provide you with the care you need, when you need it. You will need a follow up appointment in 1 months.  Please call our office 2 months in advance to schedule this appointment.  You may see No primary care provider on file. or another member of our [employer] Provider Team in [place]: Norman Herrlich, MD . Belva Crome, MD  Any Other Special Instructions Will Be Listed Below (If Applicable).

## 2019-01-24 NOTE — Progress Notes (Signed)
Virtual Visit via Video Note   This visit type was conducted due to national recommendations for restrictions regarding the COVID-19 Pandemic (e.g. social distancing) in an effort to limit this patient's exposure and mitigate transmission in our community.  Due to her co-morbid illnesses, this patient is at least at moderate risk for complications without adequate follow up.  This format is felt to be most appropriate for this patient at this time.  All issues noted in this document were discussed and addressed.  A limited physical exam was performed with this format.  Please refer to the patient's chart for her consent to telehealth for [facility].  Evaluation Performed:  Follow-up visit  This visit type was conducted due to national recommendations for restrictions regarding the COVID-19 Pandemic (e.g. social distancing).  This format is felt to be most appropriate for this patient at this time.  All issues noted in this document were discussed and addressed.  No physical exam was performed (except for noted visual exam findings with Video Visits).  Please refer to the patient's chart (MyChart message for video visits and phone note for telephone visits) for the patient's consent to telehealth for [facility].  Date:  01/24/2019  ID: Brenda Mcfarland, DOB 1946/02/24, MRN [id number]   Patient Location: [address] [place] [address]   Provider location:   [facility] [place] [facility]  PCP:  Laqueta Due., MD  Cardiologist:  Gypsy Balsam, MD     Chief Complaint: Cardiomegaly high blood pressure,  History of Present Illness:    Brenda Mcfarland is a 73 y.o. female  who presents via audio/video conferencing for a telehealth visit today.  She does have hypertension, obstructive sleep apnea, complain of being fatigued tired and exhausted.  She blames a lot of her symptoms on medication however likely I was able to perform 2.5 mg of amlodipine and she tolerated this well there is no  significant changes in her blood pressure and I told her after 2.5 mg amlodipine unlikely to have significant change in her blood pressure she understand this.  We had a long discussion today about a lot of issues with talk about healthy lifestyle need to exercise on a regular basis and she promised me that she start walking on a regular basis.  I told her to walk 10 minutes in the morning 10 minutes afternoon and gradually increase the distance and duration.  She also has significant increased dose of amlodipine.  We will go from 2.5-5.  We agree H need to keep good diet with low-salt diet she understands she will try to do that.  She also described to have episodes of hot flashes and she is questioning me about that I did have no good answer she said she got postmenopausal at age of 73 and never had a big problem with this sadly she still having those hot flashes.  I told her there is a role probably for her to see gynecologist she said she does not like to go to the doctor however she would like me to find somebody that I recommend.  We will try to investigate. Also describes situation that she will feel her heart speeding up with no particular reason I will ask her to wear monitor to clarify that.  She is also due to have echocardiogram  The patient does not have symptoms concerning for COVID-19 infection (fever, chills, cough, or new SHORTNESS OF BREATH).    Prior CV studies:   The  following studies were reviewed today:       No past medical history on file.     Current Meds  Medication Sig  . amLODipine (NORVASC) 2.5 MG tablet Take 1 tablet (2.5 mg total) by mouth daily.  Marland Kitchen. lisinopril (ZESTRIL) 10 MG tablet Take 10 mg by mouth daily.  . temazepam (RESTORIL) 15 MG capsule Take 1 capsule by mouth at bedtime as needed for sleep.      Family History: The patient's family history is not on file.   ROS:   Please see the history of present illness.     All other systems reviewed and  are negative.   Labs/Other Tests and Data Reviewed:     Recent Labs: No results found for requested labs within last 8760 hours.  Recent Lipid Panel No results found for: CHOL, TRIG, HDL, CHOLHDL, VLDL, LDLCALC, LDLDIRECT    Exam:    Vital Signs:  BP (!) 157/81   Pulse 88   Wt 213 lb (96.6 kg)   BMI 33.36 kg/m     Wt Readings from Last 3 Encounters:  01/24/19 213 lb (96.6 kg)  12/22/18 213 lb (96.6 kg)  12/16/18 215 lb (97.5 kg)     Well nourished, well developed in no acute distress. Alert awake oriented x3 long conversation through the video link.  She looks not in any distress she is with her dog named Corporate investment bankerCody.  Diagnosis for this visit:   1. Essential hypertension   2. Type 2 diabetes mellitus without complication, without long-term current use of insulin (HCC)   3. Frequent urination      ASSESSMENT & PLAN:    1.  Essential hypertension is not well controlled yet will increase dose of amlodipine from 2.5 to 5 mg daily.  We will continue checking her blood pressure. 2.  Diabetes followed by anti-medicine team 3.  Frequent elevation still describe that phenomenon. 4.  Hot flashes I recommend to see gynecologist. 5.  Palpitations we will schedule her to have monitor.  COVID-19 Education: The signs and symptoms of COVID-19 were discussed with the patient and how to seek care for testing (follow up with PCP or arrange E-visit).  The importance of social distancing was discussed today.  Patient Risk:   After full review of this patients clinical status, I feel that they are at least moderate risk at this time.  Time:   Today, I have spent 26 minutes with the patient with telehealth technology discussing pt health issues.  I spent 5 minutes reviewing her chart before the visit.  Visit was finished at 11:46 AM.    Medication Adjustments/Labs and Tests Ordered: Current medicines are reviewed at length with the patient today.  Concerns regarding medicines are  outlined above.  No orders of the defined types were placed in this encounter.  Medication changes: No orders of the defined types were placed in this encounter.    Disposition: Follow-up in 1 month  Signed, Georgeanna Leaobert J. Janean Eischen, MD, [facility] 01/24/2019 11:45 AM    [facility]

## 2019-01-31 ENCOUNTER — Ambulatory Visit (HOSPITAL_BASED_OUTPATIENT_CLINIC_OR_DEPARTMENT_OTHER)
Admission: RE | Admit: 2019-01-31 | Discharge: 2019-01-31 | Disposition: A | Discharge status: Home / Self Care | Payer: Medicare Other | Source: Ambulatory Visit | Attending: Cardiology | Admitting: Cardiology

## 2019-01-31 ENCOUNTER — Other Ambulatory Visit: Payer: Self-pay

## 2019-01-31 DIAGNOSIS — I10 Essential (primary) hypertension: Secondary | ICD-10-CM | POA: Insufficient documentation

## 2019-01-31 NOTE — Progress Notes (Signed)
*  PRELIMINARY RESULTS* 2D Echocardiogram has been performed.  Brenda Mcfarland 01/31/2019, 11:58 AM

## 2019-02-02 ENCOUNTER — Other Ambulatory Visit (INDEPENDENT_AMBULATORY_CARE_PROVIDER_SITE_OTHER): Payer: Medicare Other

## 2019-02-02 DIAGNOSIS — R002 Palpitations: Secondary | ICD-10-CM | POA: Diagnosis not present

## 2019-02-08 ENCOUNTER — Telehealth: Payer: Self-pay | Admitting: *Deleted

## 2019-02-08 NOTE — Telephone Encounter (Signed)
Pt called to find out how long to wear monitor. Let her know it is for 7 days. She says it is itchy and irritating and has been 6 or 7 days so I let her know to go ahead and mail it back.

## 2019-02-28 ENCOUNTER — Telehealth (INDEPENDENT_AMBULATORY_CARE_PROVIDER_SITE_OTHER): Payer: Medicare Other | Admitting: Cardiology

## 2019-02-28 ENCOUNTER — Other Ambulatory Visit: Payer: Self-pay

## 2019-02-28 ENCOUNTER — Encounter: Payer: Self-pay | Admitting: Cardiology

## 2019-02-28 VITALS — BP 130/87 | HR 85

## 2019-02-28 DIAGNOSIS — E119 Type 2 diabetes mellitus without complications: Secondary | ICD-10-CM | POA: Diagnosis not present

## 2019-02-28 DIAGNOSIS — I10 Essential (primary) hypertension: Secondary | ICD-10-CM | POA: Diagnosis not present

## 2019-02-28 DIAGNOSIS — G4733 Obstructive sleep apnea (adult) (pediatric): Secondary | ICD-10-CM | POA: Diagnosis not present

## 2019-02-28 MED ORDER — DILTIAZEM HCL ER COATED BEADS 120 MG PO CP24: 120.0000 mg | ORAL_CAPSULE | Freq: Every day | ORAL | 1 refills | Status: DC

## 2019-02-28 NOTE — Progress Notes (Signed)
Virtual Visit via Video Note   This visit type was conducted due to national recommendations for restrictions regarding the COVID-19 Pandemic (e.g. social distancing) in an effort to limit this patient's exposure and mitigate transmission in our community.  Due to her co-morbid illnesses, this patient is at least at moderate risk for complications without adequate follow up.  This format is felt to be most appropriate for this patient at this time.  All issues noted in this document were discussed and addressed.  A limited physical exam was performed with this format.  Please refer to the patient's chart for her consent to telehealth for [facility].  Evaluation Performed:  Follow-up visit  This visit type was conducted due to national recommendations for restrictions regarding the COVID-19 Pandemic (e.g. social distancing).  This format is felt to be most appropriate for this patient at this time.  All issues noted in this document were discussed and addressed.  No physical exam was performed (except for noted visual exam findings with Video Visits).  Please refer to the patient's chart (MyChart message for video visits and phone note for telephone visits) for the patient's consent to telehealth for [facility].  Date:  02/28/2019  ID: Brenda Mcfarland, DOB [birth year], MRN [id number]   Patient Location: [address] [place] [address]   Provider location:   [place] Office  PCP:  Karleen Hampshire., MD  Cardiologist:  Jenne Campus, MD     Chief Complaint: I am doing fine  History of Present Illness:    Brenda Mcfarland is a 73 y.o. female  who presents via audio/video conferencing for a telehealth visit today.  With hypertension which appears to be difficult to control because of intolerance to multiple medications.  Last time I put him very small dose of amlodipine only 2.5 mg daily just to check make sure she tolerated medication and everything was fine however blood  pressure was still elevated, therefore, we increased dose up to 5 mg daily I warned her about potential side effect of swelling of lower extremities she is unsure when she developed this and she decided to stop that medication.  She reports blood pressure in the morning 130/80 however in the afternoon 150-160/90.  I did echocardiogram try to see if there are any sequela of hypertension and sure enough she does have left ventricle hypertrophy with intraventricular septum and posterior wall measuring 1.4 cm.  Therefore I think we need to really control blood pressure better.  She also wear event recorder which showed some narrow complex tachycardia.  We talked again in length about nonconventional way to manage blood pressure that she wants to do.  She admits that she does not do much because of chronic back problem.  She agreed to try different calcium channel blocker, I will ask her to start taking Cardizem CD 120 to see if we can control narrow complex tachycardia as well as improve her blood pressure control.  She described to have some palpitations that she feels and hopefully with Cardizem will be able to control it better. Overall she is very happy and cheerful today.  Recently she participated in a wedding on her grandchild.  It was big event that she enjoyed this tremendously.   The patient does not have symptoms concerning for COVID-19 infection (fever, chills, cough, or new SHORTNESS OF BREATH).    Prior CV studies:   The following studies were reviewed today:  Echocardiogram done on 01/31/2019 showed:  1. The left ventricle has normal systolic function with an ejection fraction of 60-65%. The cavity size was normal. There is moderate concentric left ventricular hypertrophy. Left ventricular diastolic Doppler parameters are consistent with impaired  relaxation.  2. The right ventricle has normal systolic function. The cavity was normal. There is no increase in right ventricular wall thickness.   3. No evidence of mitral valve stenosis.  4. The aortic valve is tricuspid. No stenosis of the aortic valve.  5. The ascending aorta, aortic root and aortic arch are normal in size and structure.  Event monitor done on 02/18/2019 showed:  Conclusion:  Narrow complex tachycardias the longest episode lasting 32 seconds.  Asymptomatic    No past medical history on file.     Current Meds  Medication Sig  . temazepam (RESTORIL) 15 MG capsule Take 1 capsule by mouth at bedtime as needed for sleep.      Family History: The patient's family history is not on file.   ROS:   Please see the history of present illness.     All other systems reviewed and are negative.   Labs/Other Tests and Data Reviewed:     Recent Labs: No results found for requested labs within last 8760 hours.  Recent Lipid Panel No results found for: CHOL, TRIG, HDL, CHOLHDL, VLDL, LDLCALC, LDLDIRECT    Exam:    Vital Signs:  BP 130/87   Pulse 85     Wt Readings from Last 3 Encounters:  01/24/19 213 lb (96.6 kg)  12/22/18 213 lb (96.6 kg)  12/16/18 215 lb (97.5 kg)     Well nourished, well developed in no acute distress. Alert awake and attentive 3 quite cheerful on the phone we had a long conversation she was without any distress.  Diagnosis for this visit:   1. Essential hypertension   2. Type 2 diabetes mellitus without complication, without long-term current use of insulin (HCC)   3. Obstructive sleep apnea      ASSESSMENT & PLAN:    1.  Essential hypertension plan as outlined above we will try Cardizem 120 daily. 2.  Type 2 diabetes followed by anti-medicine team apparently stable. 3.  Obstructive sleep apnea.  Noted  COVID-19 Education: The signs and symptoms of COVID-19 were discussed with the patient and how to seek care for testing (follow up with PCP or arrange E-visit).  The importance of social distancing was discussed today.  Patient Risk:   After full review of this  patients clinical status, I feel that they are at least moderate risk at this time.  Time:   Today, I have spent 22 minutes with the patient with telehealth technology discussing pt health issues.  I spent 5 minutes reviewing her chart before the visit.  Visit was finished at 11 AM.    Medication Adjustments/Labs and Tests Ordered: Current medicines are reviewed at length with the patient today.  Concerns regarding medicines are outlined above.  No orders of the defined types were placed in this encounter.  Medication changes: No orders of the defined types were placed in this encounter.    Disposition: Follow-up in 6 weeks  Signed, Georgeanna Leaobert J. , MD, [facility] 02/28/2019 11:18 AM    [facility]

## 2019-02-28 NOTE — Patient Instructions (Signed)
Medication Instructions:  Your physician has recommended you make the following change in your medication:   Start: Cardizem 120 mg daily   If you need a refill on your cardiac medications before your next appointment, please call your pharmacy.   Lab work: None.  If you have labs (blood work) drawn today and your tests are completely normal, you will receive your results only by: Marland Kitchen MyChart Message (if you have MyChart) OR . A paper copy in the mail If you have any lab test that is abnormal or we need to change your treatment, we will call you to review the results.  Testing/Procedures: None.   Follow-Up: At [facility], you and your health needs are our priority.  As part of our continuing mission to provide you with exceptional heart care, we have created designated Provider Care Teams.  These Care Teams include your primary Cardiologist (physician) and Advanced Practice Providers (APPs -  Physician Assistants and Nurse Practitioners) who all work together to provide you with the care you need, when you need it. You will need a follow up appointment in 3 weeks.  Please call our office 2 months in advance to schedule this appointment.  You may see No primary care provider on file. or another member of our [employer] Provider Team in [place]: Shirlee More, MD . Jyl Heinz, MD  Any Other Special Instructions Will Be Listed Below (If Applicable).  Diltiazem tablets What is this medicine? DILTIAZEM (dil TYE a zem) is a calcium-channel blocker. It affects the amount of calcium found in your heart and muscle cells. This relaxes your blood vessels, which can reduce the amount of work the heart has to do. This medicine is used to treat chest pain caused by angina. This medicine may be used for other purposes; ask your health care provider or pharmacist if you have questions. COMMON BRAND NAME(S): Cardizem What should I tell my health care provider before I take this  medicine? They need to know if you have any of these conditions:  heart problems, low blood pressure, irregular heartbeat  liver disease  previous heart attack  an unusual or allergic reaction to diltiazem, other medicines, foods, dyes, or preservatives  pregnant or trying to get pregnant  breast-feeding How should I use this medicine? Take this medicine by mouth with a glass of water. Follow the directions on the prescription label. Do not cut, crush or chew this medicine. This medicine is usually taken before meals and at bedtime. Take your doses at regular intervals. Do not take your medicine more often then directed. Do not stop taking except on the advice of your doctor or health care professional. Talk to your pediatrician regarding the use of this medicine in children. Special care may be needed. Overdosage: If you think you have taken too much of this medicine contact a poison control center or emergency room at once. NOTE: This medicine is only for you. Do not share this medicine with others. What if I miss a dose? If you miss a dose, take it as soon as you can. If it is almost time for your next dose, take only that dose. Do not take double or extra doses. What may interact with this medicine? Do not take this medicine with any of the following:  cisapride  hawthorn  pimozide  ranolazine  red yeast rice This medicine may also interact with the following medications:  buspirone  carbamazepine  cimetidine  cyclosporine  digoxin  local anesthetics or  general anesthetics  lovastatin  medicines for anxiety or difficulty sleeping like midazolam and triazolam  medicines for high blood pressure or heart problems  quinidine  rifampin, rifabutin, or rifapentine This list may not describe all possible interactions. Give your health care provider a list of all the medicines, herbs, non-prescription drugs, or dietary supplements you use. Also tell them if you  smoke, drink alcohol, or use illegal drugs. Some items may interact with your medicine. What should I watch for while using this medicine? Check your blood pressure and pulse rate regularly. Ask your doctor or health care professional what your blood pressure and pulse rate should be and when you should contact him or her. You may feel dizzy or lightheaded. Do not drive, use machinery, or do anything that needs mental alertness until you know how this medicine affects you. To reduce the risk of dizzy or fainting spells, do not sit or stand up quickly, especially if you are an older patient. Alcohol can make you more dizzy or increase flushing and rapid heartbeats. Avoid alcoholic drinks. What side effects may I notice from receiving this medicine? Side effects that you should report to your doctor or health care professional as soon as possible:  allergic reactions like skin rash, itching or hives, swelling of the face, lips, or tongue  confusion, mental depression  feeling faint or lightheaded, falls  pinpoint red spots on the skin  redness, blistering, peeling or loosening of the skin, including inside the mouth  slow, irregular heartbeat  swelling of the ankles, feet  unusual bleeding or bruising Side effects that usually do not require medical attention (report to your doctor or health care professional if they continue or are bothersome):  change in sex drive or performance  constipation or diarrhea  flushing of the face  headache  nausea, vomiting  tired or weak  trouble sleeping This list may not describe all possible side effects. Call your doctor for medical advice about side effects. You may report side effects to FDA at 1-800-FDA-1088. Where should I keep my medicine? Keep out of the reach of children. Store at room temperature between 20 and 25 degrees C (68 and 77 degrees F). Protect from light. Keep container tightly closed. Throw away any unused medicine after  the expiration date. NOTE: This sheet is a summary. It may not cover all possible information. If you have questions about this medicine, talk to your doctor, pharmacist, or health care provider.  2020 Elsevier/Gold Standard (2013-07-31 10:54:31)

## 2019-03-07 ENCOUNTER — Telehealth: Payer: Self-pay | Admitting: *Deleted

## 2019-03-07 NOTE — Telephone Encounter (Signed)
Telephone call to patient. Left message to return call. 

## 2019-03-07 NOTE — Telephone Encounter (Signed)
-----   Message from Park Liter, MD sent at 03/07/2019  8:30 AM EDT ----- Asymptomatic SVT, will discuss during the visit

## 2019-04-07 ENCOUNTER — Ambulatory Visit: Payer: Medicare Other | Admitting: Cardiology

## 2019-04-17 ENCOUNTER — Ambulatory Visit: Payer: Medicare Other | Admitting: Cardiology

## 2019-05-08 ENCOUNTER — Telehealth: Payer: Self-pay | Admitting: Medical

## 2019-05-08 MED ORDER — AMLODIPINE BESYLATE 5 MG PO TABS: 5.0000 mg | ORAL_TABLET | Freq: Every day | ORAL | 3 refills | Status: DC

## 2019-05-08 NOTE — Telephone Encounter (Signed)
   Patient called the after hours line to report elevated blood pressure of 190/95 last night. She has been taking amlodipine 2.5mg  daily recently due to elevated blood pressures not responsive to homeopathic essential oil therapy. She took additional amlodipine 2.5mg  last night with improvement in blood pressure. This morning BP still elevated with SBP int he 160s. No complaints of blurry vision or stroke like symptoms, though she does reports a mild headache which has occurred intermittently for the past several weeks. Will increase her amlodipine to 5mg  daily. She reports that she has not been taking diltiazem and she was encouraged to avoid this medication while taking amlodipine. She will continue to keep a log of her blood pressures and notify the office if SBP is persistently above 130s in 1 week as additional medication adjustments may need to be made. Patient in agreement with the plan. She will contact the office to arrange an outpatient visit with Dr. Agustin Cree as her August 2020 appointment was cancelled.   Abigail Butts, PA-C 05/08/19; 12:11 PM

## 2019-08-07 ENCOUNTER — Telehealth: Payer: Self-pay | Admitting: Cardiology

## 2019-08-07 NOTE — Telephone Encounter (Signed)
Patient states her Amlodipine is 5mg  and she is still having issues. There is a script for her to pick up, can she start taking 10mg ? Please advise

## 2019-08-07 NOTE — Telephone Encounter (Signed)
Called patient. She reports that she is taking amlodipine 5 mg daily and for the past couple of days she has been checking her blood pressure and it stays around 160/90. She wants to know if this is ok or not or if she should increase her amlodipine. Patient informed I will check with Dr. Agustin Cree. During the call patient reports she hasn't been exercising as normal due to covid and has a headache today but that just came today. She denies any blurred vision.

## 2019-08-08 MED ORDER — AMLODIPINE BESYLATE 10 MG PO TABS: 10.0000 mg | ORAL_TABLET | Freq: Every day | ORAL | 2 refills | Status: DC

## 2019-08-08 NOTE — Telephone Encounter (Signed)
Called patient informed her to increase amlodipine to 10 mg daily per Dr. Agustin Cree. She verbally understood. No further questions.

## 2019-08-08 NOTE — Telephone Encounter (Signed)
Increase amlodipine 10 mg po qd

## 2019-08-21 ENCOUNTER — Telehealth: Payer: Self-pay | Admitting: *Deleted

## 2019-08-21 ENCOUNTER — Other Ambulatory Visit: Payer: Self-pay | Admitting: Cardiology

## 2019-08-21 DIAGNOSIS — I10 Essential (primary) hypertension: Secondary | ICD-10-CM

## 2019-08-21 NOTE — Telephone Encounter (Signed)
*  STAT* If patient is at the pharmacy, call can be transferred to refill team.   1. Which medications need to be refilled? (please list name of each medication and dose if known) Amlodipine 5 mg  2. Which pharmacy/location (including street and city if local pharmacy) is medication to be sent to?[facility]  3. Do they need a 30 day or 90 day supply? 90   Pt had really bad ankle swelling with the 10 mg Amlodipine and so wants to go back on the 5 mg. Would it be okay to break the 10 mg in half until she can get the 5 mg refilled again. Please advise, pt wants 90 day supply please

## 2019-08-22 MED ORDER — AMLODIPINE BESYLATE 5 MG PO TABS: 5.0000 mg | ORAL_TABLET | Freq: Every day | ORAL | 2 refills | Status: DC

## 2019-08-22 NOTE — Telephone Encounter (Signed)
This is a well-known side effects of amlodipine.  I recommend to check Chem-7 if Chem-7 is fine we may add some diuretic.  Continue 5 mg of amlodipine for now.  And please ask her what the blood pressures

## 2019-08-22 NOTE — Telephone Encounter (Signed)
Called patient. She reports that we recently increased amlodipine to 10 mg daily, but when we did she had a lot of swelling in her ankles. She denied shortness of breath and she began taking some of her old 5 mg tablets again and the swelling has went down. She wants to be prescribed a 5 mg tablet again and go back to that dose if that's ok. Will check with Dr. Agustin Cree.

## 2019-08-22 NOTE — Telephone Encounter (Signed)
Called patient informed her to continue amlodipine 5 mg daily, advised her we will check labs and decide on starting a diuretic. She doesn't know if she will need diuretic or not because she said since decreasing the amlodipine for the past two days the swelling is better. But she will still get labs and we will see. Patient gave me her bp reading today of 145/85, she will continue to monitor. No further questions at this time.

## 2019-09-05 LAB — BASIC METABOLIC PANEL
BUN/Creatinine Ratio: 22 (ref 12–28)
BUN: 17 mg/dL (ref 8–27)
CO2: 22 mmol/L (ref 20–29)
Calcium: 9 mg/dL (ref 8.7–10.3)
Chloride: 104 mmol/L (ref 96–106)
Creatinine, Ser: 0.77 mg/dL (ref 0.57–1.00)
GFR calc Af Amer: 88 mL/min/{1.73_m2} (ref >59)
GFR calc non Af Amer: 76 mL/min/{1.73_m2} (ref >59)
Glucose: 121 mg/dL — ABNORMAL HIGH (ref 65–99)
Potassium: 4 mmol/L (ref 3.5–5.2)
Sodium: 140 mmol/L (ref 134–144)

## 2019-09-07 ENCOUNTER — Telehealth: Payer: Self-pay

## 2019-09-07 MED ORDER — AMLODIPINE BESYLATE 5 MG PO TABS: 5.0000 mg | ORAL_TABLET | Freq: Every day | ORAL | 1 refills | Status: DC

## 2019-09-07 MED ORDER — HYDROCHLOROTHIAZIDE 25 MG PO TABS: 25.0000 mg | ORAL_TABLET | Freq: Every day | ORAL | 1 refills | Status: DC

## 2019-09-07 NOTE — Telephone Encounter (Signed)
Called patient back informed her to start hydrochlorothiazide 25 mg daily per Dr. Bing Matter. She was also advised to come back in 1 week for lab work. She verbally understood, no further questions.

## 2019-09-07 NOTE — Telephone Encounter (Signed)
Please see previous telephone encounter.

## 2019-09-07 NOTE — Telephone Encounter (Signed)
Called patient. Her lab results are back. Will inform Dr. Sanjuana Mae that patient reported her blood pressure was 170/95 this morning and 164/84 later. She is still taking amlodipine daily but wants to know if he wants to add something else. She has been taking 10 mg of amlodipine the past couple of days due to her blood pressure be high but she is unsure to continue since that dose is what caused her swelling initially. Will consult with Dr. Bing Matter. She also wants to know if he advises her to drive and go out due to her blood pressure she didn't know if he would think she should stay home.

## 2019-09-07 NOTE — Addendum Note (Signed)
Addended by: Lita Mains on: 09/07/2019 05:14 PM   Modules accepted: Orders

## 2019-09-07 NOTE — Telephone Encounter (Signed)
Called pt to give her lab results, pt said her blood pressure is high and she is not happy taking her amlodipine. She would like to speak with the nurse and see what she needs to do next. Please advise. Thank you.

## 2019-09-07 NOTE — Telephone Encounter (Signed)
Add HCTZ 25 po qd Chem7 in 1 week

## 2019-10-24 DIAGNOSIS — M542 Cervicalgia: Secondary | ICD-10-CM

## 2019-10-24 DIAGNOSIS — R7301 Impaired fasting glucose: Secondary | ICD-10-CM

## 2019-10-24 HISTORY — DX: Impaired fasting glucose: R73.01

## 2019-10-24 HISTORY — DX: Cervicalgia: M54.2

## 2019-11-04 ENCOUNTER — Other Ambulatory Visit: Payer: Self-pay | Admitting: Cardiology

## 2019-12-15 ENCOUNTER — Other Ambulatory Visit: Payer: Self-pay | Admitting: Cardiology

## 2020-01-12 ENCOUNTER — Telehealth: Payer: Self-pay | Admitting: Cardiology

## 2020-01-12 NOTE — Telephone Encounter (Signed)
New message   Patient has side effects with this medication amLODipine (NORVASC) 5 MG tablet(Expired). Patient states that she has swelling, negative thoughts , severe muscle cramps. Please advise.

## 2020-01-12 NOTE — Telephone Encounter (Signed)
Spoke to the patient just now. She let me know that her swelling has been mostly in her ankles and her muscle cramps are in her legs bilaterally. She said that the negative thoughts she has been having only started after she started taking this medication. She denies any thought of harming herself or others at this time.  She also states that she has been on the Internet researching this medication and she now realizes that "It is one of the top four worst medications that you can take". She would like to stop this medication and be started on something else at this time.   I let her know that I would route this to Dr. Bing Matter for further advise and recommendations.    Encouraged patient to call back with any questions or concerns.

## 2020-01-15 NOTE — Telephone Encounter (Signed)
I have no idea where she got information that amlodipine is one of the fourth worse medication ???!!!.  Clearly Internet is not a source of reliable information.  Please tell her all that I am taking amlodipine for my high blood pressure and I be taking this medication for last 15 years without any side effects.  So if this is such a horrible medication how can I take it.   Anyway regardless of stop amlodipine and schedule her to have a follow-up appointment so we can discuss this.

## 2020-01-15 NOTE — Telephone Encounter (Signed)
Spoke to the patient just now. I let her know Dr. Pearson Forster recommendations. She states that she does not want to schedule a follow up visit at this time due to personal issues that are going on in her life right now but she will call our office when she is ready to schedule. She also states that for now she will continue to take the amlodipine.   Encouraged patient to call back with any questions or concerns.

## 2020-01-17 ENCOUNTER — Telehealth: Payer: Self-pay | Admitting: Cardiology

## 2020-01-17 NOTE — Telephone Encounter (Signed)
Called patient. She would like to be seen due to trying to stop her amlodipine and it causing her blood pressure to go up to 170/76. But she still feels terrible on the medication with swelling, dizziness, and palpitations. Patient scheduled with Dr. Servando Salina due to no openings in [place] with Dr. Bing Matter. Patient advised to continue medication for now to prevent increase in blood pressure. Patient also advised to call us if things changed or got worse. Patient verbally understood no further questions.

## 2020-01-17 NOTE — Telephone Encounter (Signed)
New Message:     Pt called to be seen next week by Dr Bing Matter. She is having side effects from her Amlodipine. The first appt Dr Bing Matter in [facility] is  02-12-20 and Rosalita Levan it is 02-15-20. She says she needs to be seen next week please.

## 2020-01-18 ENCOUNTER — Other Ambulatory Visit: Payer: Self-pay

## 2020-01-18 DIAGNOSIS — M4712 Other spondylosis with myelopathy, cervical region: Secondary | ICD-10-CM

## 2020-01-18 DIAGNOSIS — F32A Depression, unspecified: Secondary | ICD-10-CM

## 2020-01-18 DIAGNOSIS — M1990 Unspecified osteoarthritis, unspecified site: Secondary | ICD-10-CM

## 2020-01-18 DIAGNOSIS — R413 Other amnesia: Secondary | ICD-10-CM

## 2020-01-18 HISTORY — DX: Other amnesia: R41.3

## 2020-01-18 HISTORY — DX: Other spondylosis with myelopathy, cervical region: M47.12

## 2020-01-18 HISTORY — DX: Depression, unspecified: F32.A

## 2020-01-18 HISTORY — DX: Unspecified osteoarthritis, unspecified site: M19.90

## 2020-01-22 ENCOUNTER — Encounter: Payer: Self-pay | Admitting: Cardiology

## 2020-01-22 ENCOUNTER — Other Ambulatory Visit: Payer: Self-pay

## 2020-01-22 ENCOUNTER — Ambulatory Visit (INDEPENDENT_AMBULATORY_CARE_PROVIDER_SITE_OTHER): Payer: Medicare HMO | Admitting: Cardiology

## 2020-01-22 VITALS — BP 164/92 | HR 98 | Ht 67.0 in | Wt 239.0 lb

## 2020-01-22 DIAGNOSIS — I10 Essential (primary) hypertension: Secondary | ICD-10-CM

## 2020-01-22 DIAGNOSIS — I471 Supraventricular tachycardia, unspecified: Secondary | ICD-10-CM | POA: Insufficient documentation

## 2020-01-22 MED ORDER — DILTIAZEM HCL ER COATED BEADS 120 MG PO CP24
120.0000 mg | ORAL_CAPSULE | Freq: Every day | ORAL | 3 refills | Status: DC
Start: 2020-01-22 — End: 2020-03-13

## 2020-01-22 NOTE — Patient Instructions (Signed)
Medication Instructions:  Your physician has recommended you make the following change in your medication:  1-STOP amlodipine  2-START Cardizem 120 mg by mouth daily.  *If you need a refill on your cardiac medications before your next appointment, please call your pharmacy*  Lab Work: If you have labs (blood work) drawn today and your tests are completely normal, you will receive your results only by: Marland Kitchen MyChart Message (if you have MyChart) OR . A paper copy in the mail If you have any lab test that is abnormal or we need to change your treatment, we will call you to review the results.  Testing/Procedures: None ordered today.  Follow-Up: At [facility], you and your health needs are our priority.  As part of our continuing mission to provide you with exceptional heart care, we have created designated Provider Care Teams.  These Care Teams include your primary Cardiologist (physician) and Advanced Practice Providers (APPs -  Physician Assistants and Nurse Practitioners) who all work together to provide you with the care you need, when you need it.  We recommend signing up for the patient portal called "MyChart".  Sign up information is provided on this After Visit Summary.  MyChart is used to connect with patients for Virtual Visits (Telemedicine).  Patients are able to view lab/test results, encounter notes, upcoming appointments, etc.  Non-urgent messages can be sent to your provider as well.   To learn more about what you can do with MyChart, go to [email or web address].    Your next appointment:   2 month(s)  The format for your next appointment:   In Person  Provider:   Thomasene Ripple, DO

## 2020-01-22 NOTE — Progress Notes (Signed)
Cardiology Office Note:    Date:  01/22/2020   ID:  Brenda Mcfarland, DOB August 15, 1946, MRN [id number]  PCP:  Karleen Hampshire., MD  Cardiologist:  No primary care provider on file.  Electrophysiologist:  None   Referring MD: Karleen Hampshire., MD   I have been having trouble with my blood pressue  History of Present Illness:    Brenda Mcfarland is a 74 y.o. female with a hx of hypertension, diabetes, hyperlipidemia, presents today to be evaluated for increase in blood pressure and potation's.  Patient tells me that she had been taking amlodipine 5 mg daily that seem to have been helping.  But recently her blood pressure has been in the 492E systolics.  She notes that of recent she also has been experiencing significant palpitations.  She did call our office and was asked to come in to be evaluated.  Past Medical History:  Diagnosis Date  . Abnormal EKG 10/23/2016  . Arthritis 01/18/2020  . Chronic fatigue 10/08/2015  . Depression 01/18/2020  . Diabetes mellitus ([place]) 10/08/2015   Formatting of this note might be different from the original. Overview:  last Hgb A1C 7.2/no blood sugars at home Formatting of this note might be different from the original. last Hgb A1C 7.2/no blood sugars at home  . ESR raised 11/27/2016  . Frequent urination 12/16/2018  . Glaucoma 10/08/2015  . History of pancreatitis 07/11/2014  . 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/23/2016  . Hypertension 10/08/2015  . IFG (impaired fasting glucose) 10/24/2019  . Incomplete emptying of bladder 11/30/2017  . Knee pain, left 12/22/2016  . Loss of memory 01/18/2020  . Major depressive disorder in partial remission ([place]) 10/08/2015  . Morbid obesity ([place]) 10/23/2016  . Obesity (BMI 30-39.9) 11/10/2016  . Obstructive sleep apnea 12/16/2018  . Osteoarthritis of both knees 11/27/2016  . Other insomnia 03/05/2017  . Palpitations 10/23/2016  . Positive ANA (antinuclear antibody) 10/23/2016  . Sleep apnea 10/08/2015   Formatting of this note might be different from the  original. Overview:  does not use CPAP Formatting of this note might be different from the original. does not use CPAP  . Sore neck 10/24/2019  . Spondylosis of cervical spine with myelopathy 01/18/2020  . Type 2 diabetes mellitus without complication, without long-term current use of insulin ([place]) 12/16/2018  . Vitamin D deficiency 10/08/2015    History reviewed. No pertinent surgical history.  Current Medications: Current Meds  Medication Sig  . latanoprost (XALATAN) 0.005 % ophthalmic solution Place 1 drop into both eyes at bedtime.  . Multiple Vitamin (ONE DAILY) tablet Take 1 tablet by mouth daily.  . temazepam (RESTORIL) 15 MG capsule Take 1-2 capsules by mouth at bedtime as needed. For sleep  . [DISCONTINUED] amLODipine (NORVASC) 5 MG tablet Take 1 tablet (5 mg total) by mouth daily.     Allergies:   Aripiprazole, Carvedilol, Paroxetine hcl, Tizanidine, Tramadol, and Zolpidem   Social History   Socioeconomic History  . Marital status: Unknown    Spouse name: Not on file  . Number of children: Not on file  . Years of education: Not on file  . Highest education level: Not on file  Occupational History  . Not on file  Tobacco Use  . Smoking status: Former Research scientist (life sciences)  . Smokeless tobacco: Never Used  Substance and Sexual Activity  . Alcohol use: Not Currently  . Drug use: Not Currently  . Sexual activity: Not on file  Other Topics Concern  . Not on file  Social History Narrative  . Not on file   Social Determinants of Health   Financial Resource Strain:   . Difficulty of Paying Living Expenses:   Food Insecurity:   . Worried About Charity fundraiser in the Last Year:   . Arboriculturist in the Last Year:   Transportation Needs:   . Film/video editor (Medical):   Marland Kitchen Lack of Transportation (Non-Medical):   Physical Activity:   . Days of Exercise per Week:   . Minutes of Exercise per Session:   Stress:   . Feeling of Stress :   Social Connections:   . Frequency  of Communication with Friends and Family:   . Frequency of Social Gatherings with Friends and Family:   . Attends Religious Services:   . Active Member of Clubs or Organizations:   . Attends Archivist Meetings:   Marland Kitchen Marital Status:      Family History: The patient's family history is not on file.  ROS:   Review of Systems  Constitution: Negative for decreased appetite, fever and weight gain.  HENT: Negative for congestion, ear discharge, hoarse voice and sore throat.   Eyes: Negative for discharge, redness, vision loss in right eye and visual halos.  Cardiovascular: Report palpitations and leg swelling.  Negative for chest pain, dyspnea on exertion, orthopnea.  Respiratory: Negative for cough, hemoptysis, shortness of breath and snoring.   Endocrine: Negative for heat intolerance and polyphagia.  Hematologic/Lymphatic: Negative for bleeding problem. Does not bruise/bleed easily.  Skin: Negative for flushing, nail changes, rash and suspicious lesions.  Musculoskeletal: Negative for arthritis, joint pain, muscle cramps, myalgias, neck pain and stiffness.  Gastrointestinal: Negative for abdominal pain, bowel incontinence, diarrhea and excessive appetite.  Genitourinary: Negative for decreased libido, genital sores and incomplete emptying.  Neurological: Negative for brief paralysis, focal weakness, headaches and loss of balance.  Psychiatric/Behavioral: Negative for altered mental status, depression and suicidal ideas.  Allergic/Immunologic: Negative for HIV exposure and persistent infections.    EKGs/Labs/Other Studies Reviewed:    The following studies were reviewed today:   EKG:  The ekg ordered today demonstrates sinus rhythm, heart rate 98 bpm poor R wave progression in the precordial leads, cannot rule anterior infarction of age indeterminate.  Echocardiogram done on 01/31/2019 showed: 1. The left ventricle has normal systolic function with an ejection fraction of  60-65%. The cavity size was normal. There is moderate concentric left ventricular hypertrophy. Left ventricular diastolic Doppler parameters are consistent with impaired  relaxation. 2. The right ventricle has normal systolic function. The cavity was normal. There is no increase in right ventricular wall thickness. 3. No evidence of mitral valve stenosis. 4. The aortic valve is tricuspid. No stenosis of the aortic valve. 5. The ascending aorta, aortic root and aortic arch are normal in size and structure.  Event monitor done on 02/18/2019 showed:  Conclusion: Narrow complex tachycardias the longest episode lasting 32 seconds. Asymptomatic     Recent Labs: 09/04/2019: BUN 17; Creatinine, Ser 0.77; Potassium 4.0; Sodium 140  Recent Lipid Panel No results found for: CHOL, TRIG, HDL, CHOLHDL, VLDL, LDLCALC, LDLDIRECT  Physical Exam:    VS:  BP (!) 164/92   Pulse 98   Ht 5' 7" (1.702 m)   Wt 239 lb (108.4 kg)   SpO2 96%   BMI 37.43 kg/m     Wt Readings from Last 3 Encounters:  01/22/20 239 lb (108.4 kg)  01/24/19 213 lb (96.6 kg)  12/22/18 213  lb (96.6 kg)     GEN: Well nourished, well developed in no acute distress HEENT: Normal NECK: No JVD; No carotid bruits LYMPHATICS: No lymphadenopathy CARDIAC: S1S2 noted,RRR, no murmurs, rubs, gallops RESPIRATORY:  Clear to auscultation without rales, wheezing or rhonchi  ABDOMEN: Soft, non-tender, non-distended, +bowel sounds, no guarding. EXTREMITIES: No edema, No cyanosis, no clubbing MUSCULOSKELETAL:  No deformity  SKIN: Warm and dry NEUROLOGIC:  Alert and oriented x 3, non-focal PSYCHIATRIC:  Normal affect, good insight  ASSESSMENT:    1. Essential hypertension   2. Paroxysmal SVT (supraventricular tachycardia) (HCC)    PLAN:    1.  I have discussed with the patient in great detail.  At this time we will going to stop her amlodipine.  And start Cardizem 120 mg daily.  She tells me she had not been able to try  this medication before.  She had questions about side effects of this medication which was also discussed with her and all her questions were answered to her satisfaction.  2.  Palpitations-she has a history of paroxysmal SVT on the monitor that she wore in June 2020.  I am hoping that the addition of Cardizem 120 mg will help with her symptoms.  The patient is in agreement with the above plan. The patient left the office in stable condition.  The patient will follow up in 2 months   Medication Adjustments/Labs and Tests Ordered: Current medicines are reviewed at length with the patient today.  Concerns regarding medicines are outlined above.  Orders Placed This Encounter  Procedures  . EKG 12-Lead   Meds ordered this encounter  Medications  . diltiazem (CARDIZEM CD) 120 MG 24 hr capsule    Sig: Take 1 capsule (120 mg total) by mouth daily.    Dispense:  90 capsule    Refill:  3    Patient Instructions  Medication Instructions:  Your physician has recommended you make the following change in your medication:  1-STOP amlodipine  2-START Cardizem 120 mg by mouth daily.  *If you need a refill on your cardiac medications before your next appointment, please call your pharmacy*  Lab Work: If you have labs (blood work) drawn today and your tests are completely normal, you will receive your results only by: Marland Kitchen MyChart Message (if you have MyChart) OR . A paper copy in the mail If you have any lab test that is abnormal or we need to change your treatment, we will call you to review the results.  Testing/Procedures: None ordered today.  Follow-Up: At [facility], you and your health needs are our priority.  As part of our continuing mission to provide you with exceptional heart care, we have created designated Provider Care Teams.  These Care Teams include your primary Cardiologist (physician) and Advanced Practice Providers (APPs -  Physician Assistants and Nurse Practitioners) who  all work together to provide you with the care you need, when you need it.  We recommend signing up for the patient portal called "MyChart".  Sign up information is provided on this After Visit Summary.  MyChart is used to connect with patients for Virtual Visits (Telemedicine).  Patients are able to view lab/test results, encounter notes, upcoming appointments, etc.  Non-urgent messages can be sent to your provider as well.   To learn more about what you can do with MyChart, go to [email or web address].    Your next appointment:   2 month(s)  The format for your next appointment:   In Person  Provider:   Berniece Salines, DO       Adopting a Healthy Lifestyle.  Know what a healthy weight is for you (roughly BMI <25) and aim to maintain this   Aim for 7+ servings of fruits and vegetables daily   65-80+ fluid ounces of water or unsweet tea for healthy kidneys   Limit to max 1 drink of alcohol per day; avoid smoking/tobacco   Limit animal fats in diet for cholesterol and heart health - choose grass fed whenever available   Avoid highly processed foods, and foods high in saturated/trans fats   Aim for low stress - take time to unwind and care for your mental health   Aim for 150 min of moderate intensity exercise weekly for heart health, and weights twice weekly for bone health   Aim for 7-9 hours of sleep daily   When it comes to diets, agreement about the perfect plan isnt easy to find, even among the experts. Experts at the [employer] developed an idea known as the Healthy Eating Plate. Just imagine a plate divided into logical, healthy portions.   The emphasis is on diet quality:   Load up on vegetables and fruits - one-half of your plate: Aim for color and variety, and remember that potatoes dont count.   Go for whole grains - one-quarter of your plate: Whole wheat, barley, wheat berries, quinoa, oats, brown rice, and foods made with them. If you  want pasta, go with whole wheat pasta.   Protein power - one-quarter of your plate: Fish, chicken, beans, and nuts are all healthy, versatile protein sources. Limit red meat.   The diet, however, does go beyond the plate, offering a few other suggestions.   Use healthy plant oils, such as olive, canola, soy, corn, sunflower and peanut. Check the labels, and avoid partially hydrogenated oil, which have unhealthy trans fats.   If youre thirsty, drink water. Coffee and tea are good in moderation, but skip sugary drinks and limit milk and dairy products to one or two daily servings.   The type of carbohydrate in the diet is more important than the amount. Some sources of carbohydrates, such as vegetables, fruits, whole grains, and beans-are healthier than others.   Finally, stay active  Signed, Berniece Salines, DO  01/22/2020 9:01 AM    [place]

## 2020-02-15 ENCOUNTER — Other Ambulatory Visit: Payer: Self-pay | Admitting: Cardiology

## 2020-03-11 ENCOUNTER — Telehealth: Payer: Self-pay | Admitting: Cardiology

## 2020-03-11 NOTE — Telephone Encounter (Signed)
Called patient back. She reports that she was told to stop amlodipine in may and start cardizem. This made her feel bad so she stopped to cardizem and went back to the amlodipine. She continues to have ankle swelling that she has always had, palpitations, abnormal breathing at times (feeling like she has to take a deep breath), and 10 days ago she had a vertigo episode that made her feel so bad she was having to lay in the bed for days. This happened twice. She also reports feeling nervous and trembly. She thinks it is from the amlodipine but isn't sure what she should do. I advised patient to go to the emergency department if one of these episodes return, furthermore I will consult with Dr. Servando Salina for further recommendation. Patient aware we will call her back tomorrow.

## 2020-03-11 NOTE — Telephone Encounter (Signed)
Pt c/o medication issue:  1. Name of Medication: Amlodipine  2. How are you currently taking this medication (dosage and times per day)? 5mg , takes it every day, at the same time.   3. Are you having a reaction (difficulty breathing--STAT)? No  4. What is your medication issue? Was more than just dizziness, she said it was vertigo. She states she had to go to bed for 3 days due to the severity, it had happened 2 different times within a month. She states she could not function. Swelling in ankles. She said she can feel her heart beating all the time.      Patient also wanted to advise that she has changed pharmacies, no longer uses [facility].  [facility] [address], [place], [place] [id number]

## 2020-03-12 NOTE — Telephone Encounter (Signed)
Patient returning [employer] phone call

## 2020-03-12 NOTE — Telephone Encounter (Signed)
She is not supposed to be on amlodipine at all.  I suggest she stop the amlodipine.  See if she can move up her follow-up appointment from July 23 to this week or next week.

## 2020-03-12 NOTE — Telephone Encounter (Signed)
Called patient back informed her per Dr. Servando Salina to stop amlodipine and she was scheduled for a appointment with Dr. Servando Salina tomorrow. No further questions at this timee.

## 2020-03-12 NOTE — Telephone Encounter (Signed)
Called patient back she wondered if we could just change medications over the phone instead of an appointment I advised her with her complaints it is best she be seen and that it is probable that lab work would need to be completed before changing medications anyway. She was asking if Dr. Bing Matter could see her sooner in [place] I let her know that he is not in [place] for the next couple weeks. She verbally understood no further questions.

## 2020-03-13 ENCOUNTER — Ambulatory Visit: Payer: Medicare HMO | Admitting: Cardiology

## 2020-03-13 ENCOUNTER — Other Ambulatory Visit: Payer: Self-pay

## 2020-03-13 ENCOUNTER — Other Ambulatory Visit: Payer: Self-pay | Admitting: Cardiology

## 2020-03-13 ENCOUNTER — Encounter: Payer: Self-pay | Admitting: Cardiology

## 2020-03-13 VITALS — BP 150/92 | HR 80 | Ht 67.0 in | Wt 244.0 lb

## 2020-03-13 DIAGNOSIS — I471 Supraventricular tachycardia: Secondary | ICD-10-CM

## 2020-03-13 DIAGNOSIS — I10 Essential (primary) hypertension: Secondary | ICD-10-CM | POA: Diagnosis not present

## 2020-03-13 MED ORDER — PROPRANOLOL HCL 20 MG PO TABS: 20.0000 mg | ORAL_TABLET | Freq: Two times a day (BID) | ORAL | 3 refills | Status: DC

## 2020-03-13 NOTE — Patient Instructions (Signed)
Medication Instructions:  Your physician has recommended you make the following change in your medication:   Start Propranolol 20 mg twice daily.  *If you need a refill on your cardiac medications before your next appointment, please call your pharmacy*   Lab Work: None ordered If you have labs (blood work) drawn today and your tests are completely normal, you will receive your results only by: . MyChart Message (if you have MyChart) OR . A paper copy in the mail If you have any lab test that is abnormal or we need to change your treatment, we will call you to review the results.   Testing/Procedures: None ordered   Follow-Up: At [facility], you and your health needs are our priority.  As part of our continuing mission to provide you with exceptional heart care, we have created designated Provider Care Teams.  These Care Teams include your primary Cardiologist (physician) and Advanced Practice Providers (APPs -  Physician Assistants and Nurse Practitioners) who all work together to provide you with the care you need, when you need it.  We recommend signing up for the patient portal called "MyChart".  Sign up information is provided on this After Visit Summary.  MyChart is used to connect with patients for Virtual Visits (Telemedicine).  Patients are able to view lab/test results, encounter notes, upcoming appointments, etc.  Non-urgent messages can be sent to your provider as well.   To learn more about what you can do with MyChart, go to [email or web address].    Your next appointment:   1 month(s)  The format for your next appointment:   In Person  Provider:   Kardie Tobb, DO   Other Instructions Propranolol Extended-Release Capsules What is this medicine? PROPRANOLOL (proe PRAN oh lole) is a beta blocker. It decreases the amount of work your heart has to do and helps your heart beat regularly. It treats high blood pressure and/or prevent chest pain (also called  angina). This medicine may be used for other purposes; ask your health care provider or pharmacist if you have questions. COMMON BRAND NAME(S): Inderal LA, Inderal XL, InnoPran XL What should I tell my health care provider before I take this medicine? They need to know if you have any of these conditions:  circulation problems, or blood vessel disease  diabetes  history of heart attack or heart disease, vasospastic angina  kidney disease  liver disease  lung or breathing disease, like asthma or emphysema  pheochromocytoma  slow heart rate  thyroid disease  an unusual or allergic reaction to propranolol, other beta-blockers, medicines, foods, dyes, or preservatives  pregnant or trying to get pregnant  breast-feeding How should I use this medicine? Take this drug by mouth. Take it as directed on the prescription label at the same time every day. Do not cut, crush or chew this drug. Swallow the capsules whole. You can take it with or without food. If it upsets your stomach, take it with food. Keep taking it unless your health care provider tells you to stop. Talk to your health care provider about the use of this drug in children. Special care may be needed. Overdosage: If you think you have taken too much of this medicine contact a poison control center or emergency room at once. NOTE: This medicine is only for you. Do not share this medicine with others. What if I miss a dose? If you miss a dose, take it as soon as you can. If it is almost time for your   next dose, take only that dose. Do not take double or extra doses. What may interact with this medicine? Do not take this medicine with any of the following medications:  feverfew  phenothiazines like chlorpromazine, mesoridazine, prochlorperazine, thioridazine This medicine may also interact with the following medications:  aluminum hydroxide gel  antipyrine  antiviral medicines for HIV or AIDS  barbiturates like  phenobarbital  certain medicines for blood pressure, heart disease, irregular heart beat  cimetidine  ciprofloxacin  diazepam  fluconazole  haloperidol  isoniazid  medicines for cholesterol like cholestyramine or colestipol  medicines for mental depression  medicines for migraine headache like almotriptan, eletriptan, frovatriptan, naratriptan, rizatriptan, sumatriptan, zolmitriptan  NSAIDs, medicines for pain and inflammation, like ibuprofen or naproxen  phenytoin  rifampin  teniposide  theophylline  thyroid medicines  tolbutamide  warfarin  zileuton This list may not describe all possible interactions. Give your health care provider a list of all the medicines, herbs, non-prescription drugs, or dietary supplements you use. Also tell them if you smoke, drink alcohol, or use illegal drugs. Some items may interact with your medicine. What should I watch for while using this medicine? Visit your doctor or health care professional for regular check ups. Contact your doctor right away if your symptoms worsen. Check your blood pressure and pulse rate regularly. Ask your health care professional what your blood pressure and pulse rate should be, and when you should contact them. Do not stop taking this medicine suddenly. This could lead to serious heart-related effects. You may get drowsy or dizzy. Do not drive, use machinery, or do anything that needs mental alertness until you know how this drug affects you. Do not stand or sit up quickly, especially if you are an older patient. This reduces the risk of dizzy or fainting spells. Alcohol can make you more drowsy and dizzy. Avoid alcoholic drinks. This medicine may increase blood sugar. Ask your healthcare provider if changes in diet or medicines are needed if you have diabetes. Do not treat yourself for coughs, colds, or pain while you are taking this medicine without asking your doctor or health care professional for advice.  Some ingredients may increase your blood pressure. What side effects may I notice from receiving this medicine? Side effects that you should report to your doctor or health care professional as soon as possible:  allergic reactions like skin rash, itching or hives, swelling of the face, lips, or tongue  breathing problems  cold hands or feet  difficulty sleeping, nightmares  dry peeling skin  hallucinations  muscle cramps or weakness   signs and symptoms of high blood sugar such as being more thirsty or hungry or having to urinate more than normal. You may also feel very tired or have blurry vision.  slow heart rate  swelling of the legs and ankles  vomiting Side effects that usually do not require medical attention (report to your doctor or health care professional if they continue or are bothersome):  change in sex drive or performance  diarrhea  dry sore eyes  hair loss  nausea  weak or tired This list may not describe all possible side effects. Call your doctor for medical advice about side effects. You may report side effects to FDA at 1-800-FDA-1088. Where should I keep my medicine? Keep out of the reach of children and pets. Store at room temperature between 15 and 30 degrees C (59 and 86 degrees F). Protect from light and moisture. Keep the container tightly closed. Avoid   exposure to extreme heat. Do not freeze. Throw away any unused drug after the expiration date. NOTE: This sheet is a summary. It may not cover all possible information. If you have questions about this medicine, talk to your doctor, pharmacist, or health care provider.  2020 Elsevier/Gold Standard (2019-03-27 16:23:26)   

## 2020-03-13 NOTE — Progress Notes (Signed)
Cardiology Office Note:    Date:  03/13/2020   ID:  Brenda Mcfarland, DOB 1945-10-09, MRN [id number]  PCP:  Karleen Hampshire., MD  Cardiologist:  Berniece Salines, DO  Electrophysiologist:  None   Referring MD: Karleen Hampshire., MD   " I am still having high blood pressure but I had been feeling with the medication recently on"  History of Present Illness:    Brenda Mcfarland is a 74 y.o. female with a hx of hypertension, diabetes, hyperlipidemia presented today for follow-up visit. Last saw the patient 09/24/2019 at that time we changed her amlodipine to Cardizem giving her palpitations as well as her high blood pressure.  The patient contacted the office sinus tach she was experiencing dizziness on the Cardizem therefore she stopped this medication went back to the amlodipine and also started to feel dizziness.  Asked the patient to come to the office to be seen to discuss options for blood pressure giving her multiple allergies.  Today she tells me that her blood pressure at home is averaging in the 233A to 076A systolic at home.  Past Medical History:  Diagnosis Date  . Abnormal EKG 10/23/2016  . Arthritis 01/18/2020  . Chronic fatigue 10/08/2015  . Depression 01/18/2020  . Diabetes mellitus ([place]) 10/08/2015   Formatting of this note might be different from the original. Overview:  last Hgb A1C 7.2/no blood sugars at home Formatting of this note might be different from the original. last Hgb A1C 7.2/no blood sugars at home  . ESR raised 11/27/2016  . Frequent urination 12/16/2018  . Glaucoma 10/08/2015  . History of pancreatitis 07/11/2014  . 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/23/2016  . Hypertension 10/08/2015  . IFG (impaired fasting glucose) 10/24/2019  . Incomplete emptying of bladder 11/30/2017  . Knee pain, left 12/22/2016  . Loss of memory 01/18/2020  . Major depressive disorder in partial remission ([place]) 10/08/2015  . Morbid obesity ([place]) 10/23/2016  . Obesity (BMI 30-39.9) 11/10/2016  . Obstructive sleep apnea  12/16/2018  . Osteoarthritis of both knees 11/27/2016  . Other insomnia 03/05/2017  . Palpitations 10/23/2016  . Positive ANA (antinuclear antibody) 10/23/2016  . Sleep apnea 10/08/2015   Formatting of this note might be different from the original. Overview:  does not use CPAP Formatting of this note might be different from the original. does not use CPAP  . Sore neck 10/24/2019  . Spondylosis of cervical spine with myelopathy 01/18/2020  . Type 2 diabetes mellitus without complication, without long-term current use of insulin ([place]) 12/16/2018  . Vitamin D deficiency 10/08/2015    No past surgical history on file.  Current Medications: Current Meds  Medication Sig  . latanoprost (XALATAN) 0.005 % ophthalmic solution Place 1 drop into both eyes at bedtime.  . Multiple Vitamin (ONE DAILY) tablet Take 1 tablet by mouth daily.  . nitroGLYCERIN (NITROSTAT) 0.4 MG SL tablet Place under the tongue.  . temazepam (RESTORIL) 15 MG capsule Take 1-2 capsules by mouth at bedtime as needed. For sleep     Allergies:   Amlodipine, Aripiprazole, Carvedilol, Paroxetine hcl, Tizanidine, Tramadol, and Zolpidem   Social History   Socioeconomic History  . Marital status: Unknown    Spouse name: Not on file  . Number of children: Not on file  . Years of education: Not on file  . Highest education level: Not on file  Occupational History  . Not on file  Tobacco Use  . Smoking status: Former Research scientist (life sciences)  . Smokeless tobacco: Never Used  Substance and Sexual Activity  . Alcohol use: Not Currently  . Drug use: Not Currently  . Sexual activity: Not on file  Other Topics Concern  . Not on file  Social History Narrative  . Not on file   Social Determinants of Health   Financial Resource Strain:   . Difficulty of Paying Living Expenses:   Food Insecurity:   . Worried About Charity fundraiser in the Last Year:   . Arboriculturist in the Last Year:   Transportation Needs:   . Film/video editor (Medical):    Marland Kitchen Lack of Transportation (Non-Medical):   Physical Activity:   . Days of Exercise per Week:   . Minutes of Exercise per Session:   Stress:   . Feeling of Stress :   Social Connections:   . Frequency of Communication with Friends and Family:   . Frequency of Social Gatherings with Friends and Family:   . Attends Religious Services:   . Active Member of Clubs or Organizations:   . Attends Archivist Meetings:   Marland Kitchen Marital Status:      Family History: The patient's family history is not on file.  ROS:   Review of Systems  Constitution: Negative for decreased appetite, fever and weight gain.  HENT: Negative for congestion, ear discharge, hoarse voice and sore throat.   Eyes: Negative for discharge, redness, vision loss in right eye and visual halos.  Cardiovascular: Negative for chest pain, dyspnea on exertion, leg swelling, orthopnea and palpitations.  Respiratory: Negative for cough, hemoptysis, shortness of breath and snoring.   Endocrine: Negative for heat intolerance and polyphagia.  Hematologic/Lymphatic: Negative for bleeding problem. Does not bruise/bleed easily.  Skin: Negative for flushing, nail changes, rash and suspicious lesions.  Musculoskeletal: Negative for arthritis, joint pain, muscle cramps, myalgias, neck pain and stiffness.  Gastrointestinal: Negative for abdominal pain, bowel incontinence, diarrhea and excessive appetite.  Genitourinary: Negative for decreased libido, genital sores and incomplete emptying.  Neurological: Negative for brief paralysis, focal weakness, headaches and loss of balance.  Psychiatric/Behavioral: Negative for altered mental status, depression and suicidal ideas.  Allergic/Immunologic: Negative for HIV exposure and persistent infections.    EKGs/Labs/Other Studies Reviewed:    The following studies were reviewed today:   EKG: None today  Recent Labs: 09/04/2019: BUN 17; Creatinine, Ser 0.77; Potassium 4.0; Sodium 140    Recent Lipid Panel No results found for: CHOL, TRIG, HDL, CHOLHDL, VLDL, LDLCALC, LDLDIRECT  Physical Exam:    VS:  BP (!) 150/92 (BP Location: Right Arm, Patient Position: Sitting, Cuff Size: Large)   Pulse 80   Ht 5' 7"  (1.702 m)   Wt 244 lb (110.7 kg)   SpO2 93%   BMI 38.22 kg/m     Wt Readings from Last 3 Encounters:  03/13/20 244 lb (110.7 kg)  01/22/20 239 lb (108.4 kg)  01/24/19 213 lb (96.6 kg)     GEN: Well nourished, well developed in no acute distress HEENT: Normal NECK: No JVD; No carotid bruits LYMPHATICS: No lymphadenopathy CARDIAC: S1S2 noted,RRR, no murmurs, rubs, gallops RESPIRATORY:  Clear to auscultation without rales, wheezing or rhonchi  ABDOMEN: Soft, non-tender, non-distended, +bowel sounds, no guarding. EXTREMITIES: No edema, No cyanosis, no clubbing MUSCULOSKELETAL:  No deformity  SKIN: Warm and dry NEUROLOGIC:  Alert and oriented x 3, non-focal PSYCHIATRIC:  Normal affect, good insight  ASSESSMENT:    1. Essential hypertension   2. Paroxysmal SVT (supraventricular tachycardia) (HCC)    PLAN:  Her blood pressure is elevated here in the office today.  At home she tells me that her cuff is giving her systolics of 829F on average.  I would like the patient blood pressure to be at least less than 621 mmHg systolic, and 90 mmHg diastolic.  We discussed multiple medications she tells me that she is allergic to Coreg, she cannot take lisinopril as well as losartan and recently was not able to tolerate amlodipine and diltiazem.  Of all the choices we discussed shared decision propanolol will be started 20 mg twice daily.  I will see the patient in 1 month.  The patient is in agreement with the above plan. The patient left the office in stable condition.  The patient will follow up in in 1 month or sooner if needed.   Medication Adjustments/Labs and Tests Ordered: Current medicines are reviewed at length with the patient today.  Concerns regarding  medicines are outlined above.  No orders of the defined types were placed in this encounter.  No orders of the defined types were placed in this encounter.   There are no Patient Instructions on file for this visit.   Adopting a Healthy Lifestyle.  Know what a healthy weight is for you (roughly BMI <25) and aim to maintain this   Aim for 7+ servings of fruits and vegetables daily   65-80+ fluid ounces of water or unsweet tea for healthy kidneys   Limit to max 1 drink of alcohol per day; avoid smoking/tobacco   Limit animal fats in diet for cholesterol and heart health - choose grass fed whenever available   Avoid highly processed foods, and foods high in saturated/trans fats   Aim for low stress - take time to unwind and care for your mental health   Aim for 150 min of moderate intensity exercise weekly for heart health, and weights twice weekly for bone health   Aim for 7-9 hours of sleep daily   When it comes to diets, agreement about the perfect plan isnt easy to find, even among the experts. Experts at the [place] developed an idea known as the Healthy Eating Plate. Just imagine a plate divided into logical, healthy portions.   The emphasis is on diet quality:   Load up on vegetables and fruits - one-half of your plate: Aim for color and variety, and remember that potatoes dont count.   Go for whole grains - one-quarter of your plate: Whole wheat, barley, wheat berries, quinoa, oats, brown rice, and foods made with them. If you want pasta, go with whole wheat pasta.   Protein power - one-quarter of your plate: Fish, chicken, beans, and nuts are all healthy, versatile protein sources. Limit red meat.   The diet, however, does go beyond the plate, offering a few other suggestions.   Use healthy plant oils, such as olive, canola, soy, corn, sunflower and peanut. Check the labels, and avoid partially hydrogenated oil, which have unhealthy trans  fats.   If youre thirsty, drink water. Coffee and tea are good in moderation, but skip sugary drinks and limit milk and dairy products to one or two daily servings.   The type of carbohydrate in the diet is more important than the amount. Some sources of carbohydrates, such as vegetables, fruits, whole grains, and beans-are healthier than others.   Finally, stay active  Signed, Berniece Salines, DO  03/13/2020 3:32 PM    [facility]

## 2020-03-13 NOTE — Addendum Note (Signed)
Addended by: Eleonore Chiquito on: 03/13/2020 03:36 PM   Modules accepted: Orders

## 2020-03-22 ENCOUNTER — Ambulatory Visit: Payer: Medicare HMO | Admitting: Cardiology

## 2020-04-09 ENCOUNTER — Telehealth: Payer: Self-pay | Admitting: Cardiology

## 2020-04-09 NOTE — Telephone Encounter (Signed)
Called patient and informed her that per Dr. Servando Salina she can take tylenol/acetaminophen. Patient is asking for something stronger she suggested hydrocodone. I advised her if she needs anything stronger for headaches and back pain she should reach out to pcp.    After this discussion she then asked me about her blood pressure and how high is too high. She would like some parameters which I advised her that I would check with Dr. Servando Salina on that. I did encourage her to keep a log of blood pressures if she was concerned and share them with [place].

## 2020-04-09 NOTE — Telephone Encounter (Signed)
We will like for her target to be less than 130/80 mmHg.

## 2020-04-09 NOTE — Telephone Encounter (Signed)
She should be able to take acetaminophen.

## 2020-04-09 NOTE — Telephone Encounter (Signed)
Pt c/o medication issue:  1. Name of Medication: Propranolol  2. How are you currently taking this medication (dosage and times per day)? 2 times a day  3. Are you having a reaction (difficulty breathing--STAT)?no  4. What is your medication issue?  Having headaches and fatigued- she wants to know what can she take for the headache along with the Propranolol

## 2020-04-10 NOTE — Telephone Encounter (Signed)
Called patient. Informed her per Dr. Servando Salina of the target blood pressure. She will continue to keep log and bring to appointment with her next week. If she needs [place] before she will call. No further questions.

## 2020-04-17 ENCOUNTER — Ambulatory Visit: Payer: Medicare HMO | Admitting: Cardiology

## 2020-04-17 ENCOUNTER — Other Ambulatory Visit: Payer: Self-pay

## 2020-04-17 ENCOUNTER — Encounter: Payer: Self-pay | Admitting: Cardiology

## 2020-04-17 VITALS — BP 160/94 | HR 80 | Ht 67.0 in | Wt 241.0 lb

## 2020-04-17 DIAGNOSIS — E119 Type 2 diabetes mellitus without complications: Secondary | ICD-10-CM | POA: Diagnosis not present

## 2020-04-17 DIAGNOSIS — G4733 Obstructive sleep apnea (adult) (pediatric): Secondary | ICD-10-CM

## 2020-04-17 DIAGNOSIS — I10 Essential (primary) hypertension: Secondary | ICD-10-CM

## 2020-04-17 DIAGNOSIS — I471 Supraventricular tachycardia: Secondary | ICD-10-CM

## 2020-04-17 DIAGNOSIS — E669 Obesity, unspecified: Secondary | ICD-10-CM

## 2020-04-17 MED ORDER — HYDRALAZINE HCL 25 MG PO TABS
25.0000 mg | ORAL_TABLET | Freq: Three times a day (TID) | ORAL | 2 refills | Status: DC
Start: 2020-04-17 — End: 2020-04-23

## 2020-04-17 NOTE — Patient Instructions (Signed)
Medication Instructions:  Your physician has recommended you make the following change in your medication:   Stop Propranolol Start Hydralazine 25 mg every 8 hours.  *If you need a refill on your cardiac medications before your next appointment, please call your pharmacy*   Lab Work: None ordered If you have labs (blood work) drawn today and your tests are completely normal, you will receive your results only by: Marland Kitchen MyChart Message (if you have MyChart) OR . A paper copy in the mail If you have any lab test that is abnormal or we need to change your treatment, we will call you to review the results.   Testing/Procedures: None ordered   Follow-Up: At [facility], you and your health needs are our priority.  As part of our continuing mission to provide you with exceptional heart care, we have created designated Provider Care Teams.  These Care Teams include your primary Cardiologist (physician) and Advanced Practice Providers (APPs -  Physician Assistants and Nurse Practitioners) who all work together to provide you with the care you need, when you need it.  We recommend signing up for the patient portal called "MyChart".  Sign up information is provided on this After Visit Summary.  MyChart is used to connect with patients for Virtual Visits (Telemedicine).  Patients are able to view lab/test results, encounter notes, upcoming appointments, etc.  Non-urgent messages can be sent to your provider as well.   To learn more about what you can do with MyChart, go to [email or web address].    Your next appointment:   3 month(s)  The format for your next appointment:   In Person  Provider:   Thomasene Ripple, DO   Other Instructions Hydralazine tablets What is this medicine? HYDRALAZINE (hye DRAL a zeen) is a type of vasodilator. It relaxes blood vessels, increasing the blood and oxygen supply to your heart. This medicine is used to treat high blood pressure. This medicine may be used  for other purposes; ask your health care provider or pharmacist if you have questions. COMMON BRAND NAME(S): Apresoline What should I tell my health care provider before I take this medicine? They need to know if you have any of these conditions:  blood vessel disease  heart disease including angina or history of heart attack  kidney or liver disease  systemic lupus erythematosus (SLE)  an unusual or allergic reaction to hydralazine, tartrazine dye, other medicines, foods, dyes, or preservatives  pregnant or trying to get pregnant  breast-feeding How should I use this medicine? Take this medicine by mouth with a glass of water. Follow the directions on the prescription label. Take your doses at regular intervals. Do not take your medicine more often than directed. Do not stop taking except on the advice of your doctor or health care professional. Talk to your pediatrician regarding the use of this medicine in children. Special care may be needed. While this drug may be prescribed for children for selected conditions, precautions do apply. Overdosage: If you think you have taken too much of this medicine contact a poison control center or emergency room at once. NOTE: This medicine is only for you. Do not share this medicine with others. What if I miss a dose? If you miss a dose, take it as soon as you can. If it is almost time for your next dose, take only that dose. Do not take double or extra doses. What may interact with this medicine?  medicines for high blood pressure  medicines for  mental depression This list may not describe all possible interactions. Give your health care provider a list of all the medicines, herbs, non-prescription drugs, or dietary supplements you use. Also tell them if you smoke, drink alcohol, or use illegal drugs. Some items may interact with your medicine. What should I watch for while using this medicine? Visit your doctor or health care professional  for regular checks on your progress. Check your blood pressure and pulse rate regularly. Ask your doctor or health care professional what your blood pressure and pulse rate should be and when you should contact him or her. You may get drowsy or dizzy. Do not drive, use machinery, or do anything that needs mental alertness until you know how this medicine affects you. Do not stand or sit up quickly, especially if you are an older patient. This reduces the risk of dizzy or fainting spells. Alcohol may interfere with the effect of this medicine. Avoid alcoholic drinks. Do not treat yourself for coughs, colds, or pain while you are taking this medicine without asking your doctor or health care professional for advice. Some ingredients may increase your blood pressure. What side effects may I notice from receiving this medicine? Side effects that you should report to your doctor or health care professional as soon as possible:  chest pain, or fast or irregular heartbeat  fever, chills, or sore throat  numbness or tingling in the hands or feet  shortness of breath  skin rash, redness, blisters or itching  stiff or swollen joints  sudden weight gain  swelling of the feet or legs  swollen lymph glands  unusual weakness Side effects that usually do not require medical attention (report to your doctor or health care professional if they continue or are bothersome):  diarrhea, or constipation  headache  loss of appetite  nausea, vomiting This list may not describe all possible side effects. Call your doctor for medical advice about side effects. You may report side effects to FDA at 1-800-FDA-1088. Where should I keep my medicine? Keep out of the reach of children. Store at room temperature between 15 and 30 degrees C (59 and 86 degrees F). Throw away any unused medicine after the expiration date. NOTE: This sheet is a summary. It may not cover all possible information. If you have  questions about this medicine, talk to your doctor, pharmacist, or health care provider.  2020 Elsevier/Gold Standard (2007-12-30 15:44:58)

## 2020-04-17 NOTE — Progress Notes (Signed)
Cardiology Office Note:    Date:  04/17/2020   ID:  Brenda Mcfarland, DOB [birth year], MRN [id number]  PCP:  Karleen Hampshire., MD  Cardiologist:  Berniece Salines, DO  Electrophysiologist:  None   Referring MD: Karleen Hampshire., MD   Chief Complaint  Patient presents with  . Follow-up   History of Present Illness:    Brenda Mcfarland is a 74 y.o. female with a hx of hypertension, diabetes, hyperlipidemia presents today for follow-up visit.  I saw the patient 09/24/2019 at that time we changed her amlodipine to Cardizem giving her palpitations as well as her high blood pressure.  The patient contacted the office sinus tach she was experiencing dizziness on the Cardizem therefore she stopped this medication went back to the amlodipine and also started to feel dizziness.  I saw the patient on March 13, 2020 at that time she did not want to be on the Cardizem or back on amlodipine.  We discussed all of her medication she was allergic to both lisinopril as well as losartan and Coreg.  I therefore started patient on propanolol 20 mg twice a day.  Today she tells me that the propranolol has made her significantly fatigue and is giving her a lot of headache.  Past Medical History:  Diagnosis Date  . Abnormal EKG 10/23/2016  . Arthritis 01/18/2020  . Chronic fatigue 10/08/2015  . Depression 01/18/2020  . Diabetes mellitus ([place]) 10/08/2015   Formatting of this note might be different from the original. Overview:  last Hgb A1C 7.2/no blood sugars at home Formatting of this note might be different from the original. last Hgb A1C 7.2/no blood sugars at home  . ESR raised 11/27/2016  . Frequent urination 12/16/2018  . Glaucoma 10/08/2015  . History of pancreatitis 07/11/2014  . 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/23/2016  . Hypertension 10/08/2015  . IFG (impaired fasting glucose) 10/24/2019  . Incomplete emptying of bladder 11/30/2017  . Knee pain, left 12/22/2016  . Loss of memory 01/18/2020  . Major depressive disorder in partial  remission ([place]) 10/08/2015  . Morbid obesity ([place]) 10/23/2016  . Obesity (BMI 30-39.9) 11/10/2016  . Obstructive sleep apnea 12/16/2018  . Osteoarthritis of both knees 11/27/2016  . Other insomnia 03/05/2017  . Palpitations 10/23/2016  . Positive ANA (antinuclear antibody) 10/23/2016  . Sleep apnea 10/08/2015   Formatting of this note might be different from the original. Overview:  does not use CPAP Formatting of this note might be different from the original. does not use CPAP  . Sore neck 10/24/2019  . Spondylosis of cervical spine with myelopathy 01/18/2020  . Type 2 diabetes mellitus without complication, without long-term current use of insulin ([place]) 12/16/2018  . Vitamin D deficiency 10/08/2015    Past Surgical History:  Procedure Laterality Date  . APPENDECTOMY     age 70  . CESAREAN SECTION     X 3  . SPINE SURGERY  05/29/2015    Current Medications: Current Meds  Medication Sig  . latanoprost (XALATAN) 0.005 % ophthalmic solution Place 1 drop into both eyes at bedtime.  . Multiple Vitamin (ONE DAILY) tablet Take 1 tablet by mouth daily.  . nitroGLYCERIN (NITROSTAT) 0.4 MG SL tablet Place under the tongue.  . temazepam (RESTORIL) 15 MG capsule Take 1-2 capsules by mouth at bedtime as needed. For sleep  . [DISCONTINUED] propranolol (INDERAL) 20 MG tablet Take 1 tablet (20 mg total) by mouth 2 (two) times daily.     Allergies:   Amlodipine, Aripiprazole,  Carvedilol, Paroxetine hcl, Tizanidine, Tramadol, and Zolpidem   Social History   Socioeconomic History  . Marital status: Unknown    Spouse name: Not on file  . Number of children: Not on file  . Years of education: Not on file  . Highest education level: Not on file  Occupational History  . Not on file  Tobacco Use  . Smoking status: Former Research scientist (life sciences)  . Smokeless tobacco: Never Used  Substance and Sexual Activity  . Alcohol use: Not Currently  . Drug use: Not Currently  . Sexual activity: Not on file  Other Topics Concern    . Not on file  Social History Narrative  . Not on file   Social Determinants of Health   Financial Resource Strain:   . Difficulty of Paying Living Expenses:   Food Insecurity:   . Worried About Charity fundraiser in the Last Year:   . Arboriculturist in the Last Year:   Transportation Needs:   . Film/video editor (Medical):   Marland Kitchen Lack of Transportation (Non-Medical):   Physical Activity:   . Days of Exercise per Week:   . Minutes of Exercise per Session:   Stress:   . Feeling of Stress :   Social Connections:   . Frequency of Communication with Friends and Family:   . Frequency of Social Gatherings with Friends and Family:   . Attends Religious Services:   . Active Member of Clubs or Organizations:   . Attends Archivist Meetings:   Marland Kitchen Marital Status:      Family History: The patient's family history includes Diabetes in her mother; Heart disease in her father; Multiple sclerosis in her mother.  ROS:   Review of Systems  Constitution: Negative for decreased appetite, fever and weight gain.  HENT: Negative for congestion, ear discharge, hoarse voice and sore throat.   Eyes: Negative for discharge, redness, vision loss in right eye and visual halos.  Cardiovascular: Negative for chest pain, dyspnea on exertion, leg swelling, orthopnea and palpitations.  Respiratory: Negative for cough, hemoptysis, shortness of breath and snoring.   Endocrine: Negative for heat intolerance and polyphagia.  Hematologic/Lymphatic: Negative for bleeding problem. Does not bruise/bleed easily.  Skin: Negative for flushing, nail changes, rash and suspicious lesions.  Musculoskeletal: Negative for arthritis, joint pain, muscle cramps, myalgias, neck pain and stiffness.  Gastrointestinal: Negative for abdominal pain, bowel incontinence, diarrhea and excessive appetite.  Genitourinary: Negative for decreased libido, genital sores and incomplete emptying.  Neurological: Negative for  brief paralysis, focal weakness, headaches and loss of balance.  Psychiatric/Behavioral: Negative for altered mental status, depression and suicidal ideas.  Allergic/Immunologic: Negative for HIV exposure and persistent infections.    EKGs/Labs/Other Studies Reviewed:    The following studies were reviewed today:   EKG:  The ekg ordered today demonstrates   Recent Labs: 09/04/2019: BUN 17; Creatinine, Ser 0.77; Potassium 4.0; Sodium 140  Recent Lipid Panel No results found for: CHOL, TRIG, HDL, CHOLHDL, VLDL, LDLCALC, LDLDIRECT  Physical Exam:    VS:  BP (!) 160/94 (BP Location: Left Arm, Patient Position: Sitting, Cuff Size: Large)   Pulse 80   Ht 5' 7"  (1.702 m)   Wt 241 lb (109.3 kg)   SpO2 93%   BMI 37.75 kg/m     Wt Readings from Last 3 Encounters:  04/17/20 241 lb (109.3 kg)  03/13/20 244 lb (110.7 kg)  01/22/20 239 lb (108.4 kg)     GEN: Well nourished, well  developed in no acute distress HEENT: Normal NECK: No JVD; No carotid bruits LYMPHATICS: No lymphadenopathy CARDIAC: S1S2 noted,RRR, no murmurs, rubs, gallops RESPIRATORY:  Clear to auscultation without rales, wheezing or rhonchi  ABDOMEN: Soft, non-tender, non-distended, +bowel sounds, no guarding. EXTREMITIES: No edema, No cyanosis, no clubbing MUSCULOSKELETAL:  No deformity  SKIN: Warm and dry NEUROLOGIC:  Alert and oriented x 3, non-focal PSYCHIATRIC:  Normal affect, good insight  ASSESSMENT:    1. Essential hypertension   2. Paroxysmal SVT (supraventricular tachycardia) ([place])   3. Obstructive sleep apnea   4. Type 2 diabetes mellitus without complication, without long-term current use of insulin ([facility])   5. Obesity (BMI 30-39.9)    PLAN:     Her blood pressure is elevated in the office today.  I did sit with the patient explained to her that we have not had a chance to control her blood pressure medically.  She tells me she prefers natural oils for her blood pressure.  She has to be taken off  propanolol given the fact that is making her significantly fatigue as well as worsening headaches.  At this point I have talked with patient about starting hydralazine 25 mg every 8 hours.  She is agreeable to proceed with starting this medication.  We talked about potential side effects all of her questions has been answered.  So now the problem is she does have paroxysmal SVT which her symptoms will not be improved on hydralazine.  We will continue to monitor  So far she is intolerant to Coreg, lisinopril, losartan, amlodipine, diltiazem, and propanolol.  If she tolerates the hydralazine and her blood pressure does not improve she may need to be on additional hypertensive medication-I would 1 add Aldactone 12.5 mg daily.  Diabetes mellitus per PCP.  Obesity-the patient understands the need to lose weight with diet and exercise. We have discussed specific strategies for this.  OSA continue current management.  The patient is in agreement with the above plan. The patient left the office in stable condition.  The patient will follow up in 3 months or sooner if needed.   Medication Adjustments/Labs and Tests Ordered: Current medicines are reviewed at length with the patient today.  Concerns regarding medicines are outlined above.  No orders of the defined types were placed in this encounter.  Meds ordered this encounter  Medications  . hydrALAZINE (APRESOLINE) 25 MG tablet    Sig: Take 1 tablet (25 mg total) by mouth 3 (three) times daily.    Dispense:  90 tablet    Refill:  2    Patient Instructions  Medication Instructions:  Your physician has recommended you make the following change in your medication:   Stop Propranolol Start Hydralazine 25 mg every 8 hours.  *If you need a refill on your cardiac medications before your next appointment, please call your pharmacy*   Lab Work: None ordered If you have labs (blood work) drawn today and your tests are completely normal, you  will receive your results only by: Marland Kitchen MyChart Message (if you have MyChart) OR . A paper copy in the mail If you have any lab test that is abnormal or we need to change your treatment, we will call you to review the results.   Testing/Procedures: None ordered   Follow-Up: At [facility], you and your health needs are our priority.  As part of our continuing mission to provide you with exceptional heart care, we have created designated Provider Care Teams.  These  Care Teams include your primary Cardiologist (physician) and Advanced Practice Providers (APPs -  Physician Assistants and Nurse Practitioners) who all work together to provide you with the care you need, when you need it.  We recommend signing up for the patient portal called "MyChart".  Sign up information is provided on this After Visit Summary.  MyChart is used to connect with patients for Virtual Visits (Telemedicine).  Patients are able to view lab/test results, encounter notes, upcoming appointments, etc.  Non-urgent messages can be sent to your provider as well.   To learn more about what you can do with MyChart, go to [email or web address].    Your next appointment:   3 month(s)  The format for your next appointment:   In Person  Provider:   Berniece Salines, DO   Other Instructions Hydralazine tablets What is this medicine? HYDRALAZINE (hye DRAL a zeen) is a type of vasodilator. It relaxes blood vessels, increasing the blood and oxygen supply to your heart. This medicine is used to treat high blood pressure. This medicine may be used for other purposes; ask your health care provider or pharmacist if you have questions. COMMON BRAND NAME(S): Apresoline What should I tell my health care provider before I take this medicine? They need to know if you have any of these conditions:  blood vessel disease  heart disease including angina or history of heart attack  kidney or liver disease  systemic lupus erythematosus  (SLE)  an unusual or allergic reaction to hydralazine, tartrazine dye, other medicines, foods, dyes, or preservatives  pregnant or trying to get pregnant  breast-feeding How should I use this medicine? Take this medicine by mouth with a glass of water. Follow the directions on the prescription label. Take your doses at regular intervals. Do not take your medicine more often than directed. Do not stop taking except on the advice of your doctor or health care professional. Talk to your pediatrician regarding the use of this medicine in children. Special care may be needed. While this drug may be prescribed for children for selected conditions, precautions do apply. Overdosage: If you think you have taken too much of this medicine contact a poison control center or emergency room at once. NOTE: This medicine is only for you. Do not share this medicine with others. What if I miss a dose? If you miss a dose, take it as soon as you can. If it is almost time for your next dose, take only that dose. Do not take double or extra doses. What may interact with this medicine?  medicines for high blood pressure  medicines for mental depression This list may not describe all possible interactions. Give your health care provider a list of all the medicines, herbs, non-prescription drugs, or dietary supplements you use. Also tell them if you smoke, drink alcohol, or use illegal drugs. Some items may interact with your medicine. What should I watch for while using this medicine? Visit your doctor or health care professional for regular checks on your progress. Check your blood pressure and pulse rate regularly. Ask your doctor or health care professional what your blood pressure and pulse rate should be and when you should contact him or her. You may get drowsy or dizzy. Do not drive, use machinery, or do anything that needs mental alertness until you know how this medicine affects you. Do not stand or sit up  quickly, especially if you are an older patient. This reduces the risk of dizzy or fainting spells.  Alcohol may interfere with the effect of this medicine. Avoid alcoholic drinks. Do not treat yourself for coughs, colds, or pain while you are taking this medicine without asking your doctor or health care professional for advice. Some ingredients may increase your blood pressure. What side effects may I notice from receiving this medicine? Side effects that you should report to your doctor or health care professional as soon as possible:  chest pain, or fast or irregular heartbeat  fever, chills, or sore throat  numbness or tingling in the hands or feet  shortness of breath  skin rash, redness, blisters or itching  stiff or swollen joints  sudden weight gain  swelling of the feet or legs  swollen lymph glands  unusual weakness Side effects that usually do not require medical attention (report to your doctor or health care professional if they continue or are bothersome):  diarrhea, or constipation  headache  loss of appetite  nausea, vomiting This list may not describe all possible side effects. Call your doctor for medical advice about side effects. You may report side effects to FDA at 1-800-FDA-1088. Where should I keep my medicine? Keep out of the reach of children. Store at room temperature between 15 and 30 degrees C (59 and 86 degrees F). Throw away any unused medicine after the expiration date. NOTE: This sheet is a summary. It may not cover all possible information. If you have questions about this medicine, talk to your doctor, pharmacist, or health care provider.  2020 Elsevier/Gold Standard (2007-12-30 15:44:58)      Adopting a Healthy Lifestyle.  Know what a healthy weight is for you (roughly BMI <25) and aim to maintain this   Aim for 7+ servings of fruits and vegetables daily   65-80+ fluid ounces of water or unsweet tea for healthy kidneys   Limit to  max 1 drink of alcohol per day; avoid smoking/tobacco   Limit animal fats in diet for cholesterol and heart health - choose grass fed whenever available   Avoid highly processed foods, and foods high in saturated/trans fats   Aim for low stress - take time to unwind and care for your mental health   Aim for 150 min of moderate intensity exercise weekly for heart health, and weights twice weekly for bone health   Aim for 7-9 hours of sleep daily   When it comes to diets, agreement about the perfect plan isnt easy to find, even among the experts. Experts at the [place] developed an idea known as the Healthy Eating Plate. Just imagine a plate divided into logical, healthy portions.   The emphasis is on diet quality:   Load up on vegetables and fruits - one-half of your plate: Aim for color and variety, and remember that potatoes dont count.   Go for whole grains - one-quarter of your plate: Whole wheat, barley, wheat berries, quinoa, oats, brown rice, and foods made with them. If you want pasta, go with whole wheat pasta.   Protein power - one-quarter of your plate: Fish, chicken, beans, and nuts are all healthy, versatile protein sources. Limit red meat.   The diet, however, does go beyond the plate, offering a few other suggestions.   Use healthy plant oils, such as olive, canola, soy, corn, sunflower and peanut. Check the labels, and avoid partially hydrogenated oil, which have unhealthy trans fats.   If youre thirsty, drink water. Coffee and tea are good in moderation, but skip sugary drinks  and limit milk and dairy products to one or two daily servings.   The type of carbohydrate in the diet is more important than the amount. Some sources of carbohydrates, such as vegetables, fruits, whole grains, and beans-are healthier than others.   Finally, stay active  Signed, Berniece Salines, DO  04/17/2020 4:08 PM    [facility]

## 2020-04-19 ENCOUNTER — Telehealth: Payer: Self-pay | Admitting: Cardiology

## 2020-04-19 ENCOUNTER — Ambulatory Visit: Payer: Medicare HMO | Admitting: Cardiology

## 2020-04-19 NOTE — Telephone Encounter (Signed)
Please tell her that she can go to taking the medicine every 8 hours now.

## 2020-04-19 NOTE — Telephone Encounter (Signed)
Pt c/o medication issue:  1. Name of Medication: hydrALAZINE (APRESOLINE) 25 MG tablet  2. How are you currently taking this medication (dosage and times per day)? 1 in the morning and 1 at night  3. Are you having a reaction (difficulty breathing--STAT)? Yes  4. What is your medication issue? Patient states that Dr. Servando Salina told her to take 2 tablets for the first two days and then take 3 after the 2nd day. She states that she is having extreme fatigue and headaches. Her BP is running high today, 149/80 this morning and 169/90 right now. She wants to know if she should take one now and then take another one tonight. Please advise.

## 2020-04-19 NOTE — Telephone Encounter (Signed)
How do you advise?

## 2020-04-19 NOTE — Telephone Encounter (Signed)
Discussion regarding Hydralazine. Pt advised to take 25 mg every 8 hours and to take her BP 1-2 hours after the medication to obtain an accurate reading of her BP with medication. Pt verbalized understanding and had no additional questions.

## 2020-04-23 ENCOUNTER — Telehealth: Payer: Self-pay | Admitting: Cardiology

## 2020-04-23 MED ORDER — FELODIPINE ER 2.5 MG PO TB24: 2.5000 mg | ORAL_TABLET | Freq: Every day | ORAL | 3 refills | Status: DC

## 2020-04-23 NOTE — Telephone Encounter (Signed)
I would propose to start taking Tylenol for headache and also try to relax and see if that helps with the blood pressure as well as headache.  Obviously we cannot use any medication look like.

## 2020-04-23 NOTE — Telephone Encounter (Signed)
Follow Up:     Pt said after she took her blood pressure medicine, her blood pressure is still 180/88. She said  Blood pressure was taken by the wrist monitor,. She said her blood pressure might be higher.

## 2020-04-23 NOTE — Telephone Encounter (Signed)
That would be absolutely fine with me.

## 2020-04-23 NOTE — Telephone Encounter (Signed)
Spoke to the patient just now and she let me know that she has already taken Tylenol today and has been resting all day. She states that she does not know what to do at this time. She is very frustrated because she states that the hydralazine is not working to control her blood pressure. She is requesting something else to take for her blood pressure. She states that she can not take anything that will cause her to use the bathroom frequently due to her job circumstances.

## 2020-04-23 NOTE — Addendum Note (Signed)
Addended by: Delorse Limber I on: 04/23/2020 02:43 PM   Modules accepted: Orders

## 2020-04-23 NOTE — Telephone Encounter (Signed)
Spoke to the patient just now and let her know that Dr. Bing Matter recommended that she start felodipine 2.5 mg daily. She verbalizes understanding and thanks me for the call back.    Encouraged patient to call back with any questions or concerns.

## 2020-04-23 NOTE — Telephone Encounter (Signed)
Pt c/o medication issue:  1. Name of Medication: hydrALAZINE (APRESOLINE) 25 MG tablet  2. How are you currently taking this medication (dosage and times per day)? 3x daily  3. Are you having a reaction (difficulty breathing--STAT)? Severe headaches, difficulty thinking clearly   4. What is your medication issue? Pt said she is having severe headaches. She wanted to know if there is anything she can take to alleviate her headaches.  She does not want to take this medication at all anymore because her BP is still not coming down. She also experienced a period of time yesterday when she could not think clearly and that was unusual for her.  The patient does not want to see Dr. Servando Salina any longer. She wants to go back to seeing Dr. Bing Matter

## 2020-04-26 NOTE — Telephone Encounter (Signed)
Pt c/o BP issue: STAT if pt c/o blurred vision, one-sided weakness or slurred speech  1. What are your last 5 BP readings?    04/24/20: 10:00 am 179/90 04/25/20: 10:00 am 158/90, 5:00 pm 159/88 04/26/20: 8:30 am 182/89    2. Are you having any other symptoms (ex. Dizziness, headache, blurred vision, passed out)? Mild headache, Pt feels anxious because her BP is high  3. What is your BP issue? Pt feels like she needs to be on a medication she takes 2x daily or a stronger dose. She does not like how high her BP is with just one dose of the medication

## 2020-04-26 NOTE — Telephone Encounter (Signed)
LVM for return call. 

## 2020-04-29 NOTE — Telephone Encounter (Signed)
Follow up:     Patient has started a new medication and her BP 181/89 then she takes the medication it gose 179/90, then 179/79 before medications. Patient also called on Friday and states no one called her back. Patient is upset. Please call patient.

## 2020-04-29 NOTE — Telephone Encounter (Signed)
First of all she must get blood pressure check on the shoulder arm right hand and wrist. Blood pressure monitored for wrist and notoriously known to over exaggerate blood pressure. Also she required few days for medication to really start working so get new blood pressure monitor and keep checking blood pressure

## 2020-04-29 NOTE — Telephone Encounter (Signed)
Called patient. She was very upset that her called wasn't responded to on Friday. She can't use a arm cuff as Dr. Kirtland Bouchard advised because she can't get on. She is worried that her blood pressure is too high 180/89 when she wakes up. She feels her current medicine isn't working. Spoke to Dr. Bing Matter he wants to increase felodipine to 2.5 mg twice daily if this doesn't she will let us know.

## 2020-07-29 ENCOUNTER — Ambulatory Visit: Payer: Medicare HMO | Admitting: Cardiology

## 2020-09-13 DIAGNOSIS — K529 Noninfective gastroenteritis and colitis, unspecified: Secondary | ICD-10-CM | POA: Diagnosis not present

## 2020-09-13 DIAGNOSIS — I10 Essential (primary) hypertension: Secondary | ICD-10-CM | POA: Diagnosis not present

## 2020-09-13 DIAGNOSIS — R69 Illness, unspecified: Secondary | ICD-10-CM | POA: Diagnosis not present

## 2020-09-13 DIAGNOSIS — R1013 Epigastric pain: Secondary | ICD-10-CM | POA: Diagnosis not present

## 2020-09-13 DIAGNOSIS — R918 Other nonspecific abnormal finding of lung field: Secondary | ICD-10-CM | POA: Diagnosis not present

## 2020-09-13 DIAGNOSIS — U071 COVID-19: Secondary | ICD-10-CM | POA: Diagnosis not present

## 2020-09-13 DIAGNOSIS — Z79899 Other long term (current) drug therapy: Secondary | ICD-10-CM | POA: Diagnosis not present

## 2020-09-13 DIAGNOSIS — A0839 Other viral enteritis: Secondary | ICD-10-CM | POA: Diagnosis not present

## 2020-09-13 DIAGNOSIS — R1084 Generalized abdominal pain: Secondary | ICD-10-CM | POA: Diagnosis not present

## 2020-09-30 DIAGNOSIS — E559 Vitamin D deficiency, unspecified: Secondary | ICD-10-CM | POA: Diagnosis not present

## 2020-09-30 DIAGNOSIS — E119 Type 2 diabetes mellitus without complications: Secondary | ICD-10-CM | POA: Diagnosis not present

## 2020-09-30 DIAGNOSIS — M170 Bilateral primary osteoarthritis of knee: Secondary | ICD-10-CM | POA: Diagnosis not present

## 2020-09-30 DIAGNOSIS — I10 Essential (primary) hypertension: Secondary | ICD-10-CM | POA: Diagnosis not present

## 2020-09-30 DIAGNOSIS — I471 Supraventricular tachycardia: Secondary | ICD-10-CM | POA: Diagnosis not present

## 2020-09-30 DIAGNOSIS — G4709 Other insomnia: Secondary | ICD-10-CM | POA: Diagnosis not present

## 2020-09-30 DIAGNOSIS — R69 Illness, unspecified: Secondary | ICD-10-CM | POA: Diagnosis not present

## 2020-09-30 DIAGNOSIS — U071 COVID-19: Secondary | ICD-10-CM | POA: Diagnosis not present

## 2020-10-21 DIAGNOSIS — R700 Elevated erythrocyte sedimentation rate: Secondary | ICD-10-CM | POA: Diagnosis not present

## 2020-10-21 DIAGNOSIS — E119 Type 2 diabetes mellitus without complications: Secondary | ICD-10-CM | POA: Diagnosis not present

## 2020-10-21 DIAGNOSIS — E559 Vitamin D deficiency, unspecified: Secondary | ICD-10-CM | POA: Diagnosis not present

## 2020-10-21 DIAGNOSIS — M170 Bilateral primary osteoarthritis of knee: Secondary | ICD-10-CM | POA: Diagnosis not present

## 2020-10-21 DIAGNOSIS — G4733 Obstructive sleep apnea (adult) (pediatric): Secondary | ICD-10-CM | POA: Diagnosis not present

## 2020-10-21 DIAGNOSIS — I10 Essential (primary) hypertension: Secondary | ICD-10-CM | POA: Diagnosis not present

## 2020-10-21 DIAGNOSIS — R69 Illness, unspecified: Secondary | ICD-10-CM | POA: Diagnosis not present

## 2020-10-21 DIAGNOSIS — Z8616 Personal history of COVID-19: Secondary | ICD-10-CM | POA: Diagnosis not present

## 2020-10-21 DIAGNOSIS — I471 Supraventricular tachycardia: Secondary | ICD-10-CM | POA: Diagnosis not present

## 2020-11-18 ENCOUNTER — Encounter: Payer: Self-pay | Admitting: Nurse Practitioner

## 2020-11-18 ENCOUNTER — Ambulatory Visit (INDEPENDENT_AMBULATORY_CARE_PROVIDER_SITE_OTHER): Payer: Medicare HMO | Admitting: Nurse Practitioner

## 2020-11-18 ENCOUNTER — Other Ambulatory Visit: Payer: Self-pay

## 2020-11-18 VITALS — BP 110/80 | HR 86 | Temp 98.2°F | Ht 67.0 in | Wt 227.6 lb

## 2020-11-18 DIAGNOSIS — N3281 Overactive bladder: Secondary | ICD-10-CM

## 2020-11-18 DIAGNOSIS — G4733 Obstructive sleep apnea (adult) (pediatric): Secondary | ICD-10-CM | POA: Diagnosis not present

## 2020-11-18 DIAGNOSIS — R413 Other amnesia: Secondary | ICD-10-CM | POA: Diagnosis not present

## 2020-11-18 DIAGNOSIS — E119 Type 2 diabetes mellitus without complications: Secondary | ICD-10-CM

## 2020-11-18 DIAGNOSIS — G4700 Insomnia, unspecified: Secondary | ICD-10-CM

## 2020-11-18 MED ORDER — TRAZODONE HCL 50 MG PO TABS: 50.0000 mg | ORAL_TABLET | Freq: Every day | ORAL | 3 refills | Status: DC

## 2020-11-18 MED ORDER — MIRABEGRON ER 25 MG PO TB24: 25.0000 mg | ORAL_TABLET | Freq: Every day | ORAL | 1 refills | Status: DC

## 2020-11-18 NOTE — Patient Instructions (Addendum)
Cut back temazepam starting tonight- 1/2 dose for 7 nights Start trazodone 25 mg (1/2 tablet) at bedtime for 1 week then increase to 50 mg (whole tablet) at bedtime  Continue melatonin    To use CPAP   4-6 weeks for routine follow up.

## 2020-11-18 NOTE — Progress Notes (Signed)
Careteam: Patient Care Team: Lauree Chandler, NP as PCP - General (Geriatric Medicine) Berniece Salines, DO as PCP - Cardiology (Cardiology) Murlean Iba, MD as Referring Physician (Orthopedic Surgery) Park Liter, MD as Consulting Physician (Cardiology)  PLACE OF SERVICE:  [place] Directive information Does Patient Have a Medical Advance Directive?: Yes, Does patient want to make changes to medical advance directive?: No - Patient declined  Allergies  Allergen Reactions  . Amlodipine Swelling  . Aripiprazole Other (See Comments)    Tongue twisted, lips curled up Tongue twisted, lips curled up   . Carvedilol Other (See Comments)    Nightmares Nightmares   . Paroxetine Hcl Other (See Comments)    . .   . Tizanidine Other (See Comments)    Syncope Syncope   . Tramadol Other (See Comments)    Unknown  . Zolpidem Other (See Comments)    Sleepwalking Sleepwalking     Chief Complaint  Patient presents with  . Establish Care    New patient to establish care. Patient states that since having COVID she he pericarditis. She has confused thinking, clumsiness, feel shaky inside. She also states that her heart starts pounding. She has had vertigo for a few weeks now. She is also having difficulty walking. Patient would also like to have spot in scalp looked at for psoriasis.Patient has anxiety at times that she hasn't had before.     HPI: Patient is a 75 y.o. female in office today to establish care.   Rarely will get headache-uses ibuprofen.   Insomnia- using temazepam and melatonin to help with sleep. Does not feel like this helps her with sleep.  Takes 1 tablet at bedtime. Years ago tried medication but unsure what this was. Is willing to change or try something different.   COVID in January. Since then simple things that she has been able to do for years she does not remember how to do them, not remember the process.  Lives alone.   Has to concentrate more.  Prior to [place] she was highly functioning but has had trouble since.  More mental Having a hard time finding words.  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   Diabetes- has eaten more sugar recently a1c 6.5 on last labs.   Hyperlipidemia- LDL   OSA- not using CPAP    Review of Systems:  Review of Systems  Constitutional: Positive for malaise/fatigue. Negative for chills, fever and weight loss.  HENT: Positive for tinnitus.   Respiratory: Negative for cough, sputum production and shortness of breath.   Cardiovascular: Negative for chest pain, palpitations and leg swelling.  Gastrointestinal: Negative for abdominal pain, constipation, diarrhea and heartburn.  Genitourinary: Positive for frequency (getting up multiple times at night). Negative for dysuria and urgency.  Musculoskeletal: Positive for back pain. Negative for falls, joint pain and myalgias.  Skin: Positive for itching and rash.  Neurological: Positive for dizziness, weakness and headaches.  Endo/Heme/Allergies: Positive for environmental allergies.  Psychiatric/Behavioral: Positive for depression and memory loss. The patient is nervous/anxious and has insomnia.     Past Medical History:  Diagnosis Date  . Abnormal EKG 10/23/2016  . Arthritis 01/18/2020  . Chronic fatigue 10/08/2015  . Depression 01/18/2020  . Diabetes mellitus ([place]) 10/08/2015   Formatting of this note might be different from the original. Overview:  last Hgb A1C 7.2/no blood sugars at home Formatting of this note might be  different from the original. last Hgb A1C 7.2/no blood sugars at home  . ESR raised 11/27/2016  . Frequent urination 12/16/2018  . Glaucoma 10/08/2015  . High cholesterol   . History of pancreatitis 07/11/2014  . 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/23/2016  . Hypertension 10/08/2015  . IFG (impaired fasting glucose) 10/24/2019  . Incomplete emptying of bladder 11/30/2017  . Knee  pain, left 12/22/2016  . Loss of memory 01/18/2020  . Major depressive disorder in partial remission ([place]) 10/08/2015  . Morbid obesity ([place]) 10/23/2016  . Obesity (BMI 30-39.9) 11/10/2016  . Obstructive sleep apnea 12/16/2018  . Osteoarthritis of both knees 11/27/2016  . Other insomnia 03/05/2017  . Palpitations 10/23/2016  . Positive ANA (antinuclear antibody) 10/23/2016  . Sleep apnea 10/08/2015   Formatting of this note might be different from the original. Overview:  does not use CPAP Formatting of this note might be different from the original. does not use CPAP  . Sore neck 10/24/2019  . Spondylosis of cervical spine with myelopathy 01/18/2020  . Type 2 diabetes mellitus without complication, without long-term current use of insulin ([place]) 12/16/2018  . Vitamin D deficiency 10/08/2015   Past Surgical History:  Procedure Laterality Date  . APPENDECTOMY     age 62  . CESAREAN SECTION     X 3  . NECK SURGERY  2015  . SPINE SURGERY  05/29/2015   Social History:   reports that she has quit smoking. Her smoking use included cigarettes. She has a 10.00 pack-year smoking history. She has never used smokeless tobacco. She reports previous alcohol use. She reports previous drug use.  Family History  Problem Relation Age of Onset  . Diabetes Mother   . Multiple sclerosis Mother   . Heart disease Father   . Heart Problems Father   . Migraines Daughter   . Thyroid disease Daughter   . Migraines Daughter     Medications: Patient's Medications  New Prescriptions   No medications on file  Previous Medications   IBUPROFEN PO    Take by mouth as needed.   LATANOPROST (XALATAN) 0.005 % OPHTHALMIC SOLUTION    Place 1 drop into both eyes at bedtime.   MELATONIN 5 MG CAPS    Take by mouth at bedtime.   MULTIPLE VITAMIN (ONE DAILY) TABLET    Take 1 tablet by mouth daily.   NITROGLYCERIN (NITROSTAT) 0.4 MG SL TABLET    Place under the tongue.   OVER THE COUNTER MEDICATION    daily. Essential Oils    TEMAZEPAM (RESTORIL) 15 MG CAPSULE    Take 1-2 capsules by mouth at bedtime as needed. For sleep  Modified Medications   No medications on file  Discontinued Medications   FELODIPINE (PLENDIL) 2.5 MG 24 HR TABLET    Take 1 tablet (2.5 mg total) by mouth daily.    Physical Exam:  Vitals:   11/18/20 1349  BP: 110/80  Pulse: 86  Temp: 98.2 F (36.8 C)  TempSrc: Temporal  SpO2: 98%  Weight: 227 lb 9.6 oz (103.2 kg)  Height: _0  (1.702 m)   Body mass index is 35.65 kg/m. Wt Readings from Last 3 Encounters:  11/18/20 227 lb 9.6 oz (103.2 kg)  04/17/20 241 lb (109.3 kg)  03/13/20 244 lb (110.7 kg)    Physical Exam Constitutional:      General: She is not in acute distress.    Appearance: She is well-developed. She is not diaphoretic.  HENT:     Head: Normocephalic  and atraumatic.     Mouth/Throat:     Pharynx: No oropharyngeal exudate.  Eyes:     Conjunctiva/sclera: Conjunctivae normal.     Pupils: Pupils are equal, round, and reactive to light.  Cardiovascular:     Rate and Rhythm: Normal rate and regular rhythm.     Heart sounds: Normal heart sounds.  Pulmonary:     Effort: Pulmonary effort is normal.     Breath sounds: Normal breath sounds.  Abdominal:     General: Bowel sounds are normal.     Palpations: Abdomen is soft.  Musculoskeletal:        General: No tenderness.     Cervical back: Normal range of motion and neck supple.  Skin:    General: Skin is warm and dry.  Neurological:     Mental Status: She is alert and oriented to person, place, and time.  Psychiatric:        Mood and Affect: Mood normal.        Behavior: Behavior normal.     Labs reviewed: Basic Metabolic Panel: No results for input(s): NA, K, CL, CO2, GLUCOSE, BUN, CREATININE, CALCIUM, MG, PHOS, TSH in the last 8760 hours. Liver Function Tests: No results for input(s): AST, ALT, ALKPHOS, BILITOT, PROT, ALBUMIN in the last 8760 hours. No results for input(s): LIPASE, AMYLASE in the last  8760 hours. No results for input(s): AMMONIA in the last 8760 hours. CBC: No results for input(s): WBC, NEUTROABS, HGB, HCT, MCV, PLT in the last 8760 hours. Lipid Panel: No results for input(s): CHOL, HDL, LDLCALC, TRIG, CHOLHDL, LDLDIRECT in the last 8760 hours. TSH: No results for input(s): TSH in the last 8760 hours. A1C: No results found for: HGBA1C   Assessment/Plan 1. Type 2 diabetes mellitus without complication, without long-term current use of insulin (HCC) Diet controlled but trending up. Encouraged dietary compliance, routine foot care/monitoring and to keep up with diabetic eye exams through ophthalmology   2. Memory loss -worsening memory and fog since COVID and starting temazepam.  -will have her wean off temazepam to see if symptoms improve -she plans to have a work up with neurologist and will defer imaging to them - Vitamin B12 - RPR  3. Overactive bladder -frequently getting up at night to go to the bathroom. Effecting sleep.  - mirabegron ER (MYRBETRIQ) 25 MG TB24 tablet; Take 1 tablet (25 mg total) by mouth daily.  Dispense: 30 tablet; Refill: 1  4. Insomnia, unspecified type -has issues with sleep therefore she was prescribed temazepam however since then has had issues with gait, confusion and difficulty walking. Will wean at this time and start trazodone daily for sleep.  - traZODone (DESYREL) 50 MG tablet; Take 1 tablet (50 mg total) by mouth at bedtime.  Dispense: 30 tablet; Refill: 3  5. OSA (obstructive sleep apnea) Encouraged to use CPAP but unable to tolerate.   There are other unrelated non-urgent complaints,and the amount of time I've already spent with her, time does not permit me to address these routine issues at today's visit. I've requested another appointment to review these additional issues.  Next appt: 4 weeks.  Carlos American. [place], [facility] [phone]

## 2020-11-19 LAB — VITAMIN B12: Vitamin B-12: 560 pg/mL (ref 200–1100)

## 2020-11-19 LAB — RPR: RPR Ser Ql: NONREACTIVE

## 2020-11-27 ENCOUNTER — Telehealth: Payer: Self-pay | Admitting: *Deleted

## 2020-11-27 DIAGNOSIS — R410 Disorientation, unspecified: Secondary | ICD-10-CM | POA: Diagnosis not present

## 2020-11-27 NOTE — Telephone Encounter (Signed)
Patient called and stated that she saw you and was given Trazodone 5mg . Patient stated that they are still not sleeping and wants to know if it would be ok to Increase it to 2 tablets at bedtime to make 10mg .   Please Advise.

## 2020-11-27 NOTE — Telephone Encounter (Signed)
Would like her to stay with the 50 mg at this time, is she still taking the melatonin as well?

## 2020-11-29 ENCOUNTER — Telehealth: Payer: Self-pay

## 2020-11-29 MED ORDER — TRAZODONE HCL 100 MG PO TABS: 100.0000 mg | ORAL_TABLET | Freq: Every day | ORAL | 1 refills | Status: DC

## 2020-11-29 NOTE — Telephone Encounter (Signed)
I called patient back to inform her that Brenda Mcfarland had said it ws fine for her to start taking 100 mg at bedtime and she wanted to know that when she runs out early because of taking medication doubled will she be able to get the refill

## 2020-11-29 NOTE — Telephone Encounter (Signed)
Yes okay to increase trazodone to 100 mg by mouth at bedtime to help with sleep.

## 2020-11-29 NOTE — Telephone Encounter (Signed)
Please send in new Rx to pharmacy for trazodone 100 mg by mouth at bedtime

## 2020-11-29 NOTE — Telephone Encounter (Signed)
Patient called and ask if she can start taking 2 Trazodone tablets instead of 1 as the one is not helping and she is not sleeping and she has tried to take melatonin along with the trazodone but still no rest and she says she really needs to sleep she says you can call her or send response by My Chart    Please Advise

## 2020-12-02 NOTE — Telephone Encounter (Signed)
Forwarded to Clinical Intake.  

## 2020-12-02 NOTE — Telephone Encounter (Signed)
Taking care of per Abbey Chatters, NP

## 2020-12-10 ENCOUNTER — Telehealth: Payer: Self-pay

## 2020-12-10 ENCOUNTER — Other Ambulatory Visit: Payer: Self-pay | Admitting: Nurse Practitioner

## 2020-12-10 NOTE — Telephone Encounter (Signed)
Patient called and states that her medication "Trazodone 100mg " needs to be sent to [facility]. Because she's no longer using [facility] in . Patient states that 50mg  of Trazodone isn't working for her. Message routed to PCP [employer], , NP . Please Advise.

## 2020-12-25 DIAGNOSIS — H04123 Dry eye syndrome of bilateral lacrimal glands: Secondary | ICD-10-CM | POA: Diagnosis not present

## 2020-12-25 DIAGNOSIS — H40053 Ocular hypertension, bilateral: Secondary | ICD-10-CM | POA: Diagnosis not present

## 2021-01-10 NOTE — Telephone Encounter (Signed)
Refer to medication list. Medication already sent into pharmacy by PCP Janyth Contes Janene Harvey, NP

## 2021-02-20 ENCOUNTER — Other Ambulatory Visit: Payer: Self-pay | Admitting: *Deleted

## 2021-02-20 MED ORDER — TRAZODONE HCL 100 MG PO TABS: ORAL_TABLET | ORAL | 0 refills | Status: DC

## 2021-02-20 NOTE — Telephone Encounter (Signed)
Received refill Request from [facility].  Patient needs an appointment before anymore future refills.

## 2021-03-12 ENCOUNTER — Other Ambulatory Visit: Payer: Self-pay

## 2021-03-12 ENCOUNTER — Ambulatory Visit (INDEPENDENT_AMBULATORY_CARE_PROVIDER_SITE_OTHER): Payer: Medicare HMO | Admitting: Nurse Practitioner

## 2021-03-12 ENCOUNTER — Encounter: Payer: Self-pay | Admitting: Nurse Practitioner

## 2021-03-12 VITALS — BP 160/90 | HR 80 | Temp 97.0°F | Ht 67.0 in | Wt 232.6 lb

## 2021-03-12 DIAGNOSIS — E785 Hyperlipidemia, unspecified: Secondary | ICD-10-CM

## 2021-03-12 DIAGNOSIS — E119 Type 2 diabetes mellitus without complications: Secondary | ICD-10-CM

## 2021-03-12 DIAGNOSIS — G4700 Insomnia, unspecified: Secondary | ICD-10-CM | POA: Diagnosis not present

## 2021-03-12 DIAGNOSIS — L219 Seborrheic dermatitis, unspecified: Secondary | ICD-10-CM | POA: Diagnosis not present

## 2021-03-12 DIAGNOSIS — N3281 Overactive bladder: Secondary | ICD-10-CM

## 2021-03-12 MED ORDER — TRAZODONE HCL 100 MG PO TABS: ORAL_TABLET | ORAL | 1 refills | Status: DC

## 2021-03-12 MED ORDER — KETOCONAZOLE 2 % EX SHAM
1.0000 | MEDICATED_SHAMPOO | CUTANEOUS | 0 refills | Status: DC
Start: 2021-03-13 — End: 2021-04-15

## 2021-03-12 MED ORDER — TRAZODONE HCL 150 MG PO TABS: ORAL_TABLET | ORAL | 1 refills | Status: DC

## 2021-03-12 MED ORDER — MIRABEGRON ER 50 MG PO TB24: 50.0000 mg | ORAL_TABLET | Freq: Every day | ORAL | 1 refills | Status: DC

## 2021-03-12 MED ORDER — MIRABEGRON ER 50 MG PO TB24
50.0000 mg | ORAL_TABLET | Freq: Every day | ORAL | 1 refills | Status: DC
Start: 2021-03-12 — End: 2021-03-12

## 2021-03-12 NOTE — Progress Notes (Signed)
Careteam: Patient Care Team: Lauree Chandler, NP as PCP - General (Geriatric Medicine) Berniece Salines, DO as PCP - Cardiology (Cardiology) Murlean Iba, MD as Referring Physician (Orthopedic Surgery) Park Liter, MD as Consulting Physician (Cardiology)  PLACE OF SERVICE:  [facility]  Advanced Directive information    Allergies  Allergen Reactions   Amlodipine Swelling   Aripiprazole Other (See Comments)    Tongue twisted, lips curled up Tongue twisted, lips curled up    Carvedilol Other (See Comments)    Nightmares Nightmares    Paroxetine Hcl Other (See Comments)    . Marland Kitchen    Tizanidine Other (See Comments)    Syncope Syncope    Tramadol Other (See Comments)    Unknown   Zolpidem Other (See Comments)    Sleepwalking Sleepwalking     Chief Complaint  Patient presents with   Medical Management of Chronic Issues    Follow up.Patient has several issues she would like to discuss. Patient would like to discuss medication refill. Patient having frequent urination with no other symptoms.   Health Maintenance    COVID vaccine, Foot exam, eye exam, urine microalbumin, Hep C screening, Zoster vaccine, Dexa scan and pneumonia vaccine    HPI: Patient is a 75 y.o. female for follow up.   Insomnia- taking trazodone for sleep. Taking melatonin.  She is not able to get to sleep and then the next hour she is awake.  She is waking up to go to the bathroom.  Has been to urologist but did not offer any additional intervention.  She does not want to take any medication that could possibly effect memory   Had some issues with dizziness/vertigo/lightheadedness thought to be related to trazodone.  Taking magnesium supplement as well.    Overactive bladder- ongoing. No pain. Been going on for years. Went to urologist and did not offer anything other than medication.   Feels like confused thinking is due to lack of sleep.   Unable to tolerate CPAP due to  increase in urination.   Stressed due to being later.   Reports she has lines in her fingers and they are getting worse.   Scalp is red and itching.   White mole on her head that she medicates but then comes back a friend just had a lesion removed and it was a skin cancer.   Hyperlipidemia- was on lipitor over 10 years ago but had side effects.    Review of Systems:  Review of Systems  Constitutional:  Negative for chills, fever and weight loss.  HENT:  Negative for tinnitus.   Respiratory:  Negative for cough, sputum production and shortness of breath.   Cardiovascular:  Negative for chest pain, palpitations and leg swelling.  Gastrointestinal:  Negative for abdominal pain, constipation, diarrhea and heartburn.  Genitourinary:  Positive for frequency and urgency. Negative for dysuria.  Musculoskeletal:  Negative for back pain, falls, joint pain and myalgias.  Skin:  Positive for itching and rash.  Neurological:  Positive for dizziness. Negative for headaches.  Psychiatric/Behavioral:  Positive for memory loss. Negative for depression. The patient has insomnia.    Past Medical History:  Diagnosis Date   Abnormal EKG 10/23/2016   Arthritis 01/18/2020   Chronic fatigue 10/08/2015   Depression 01/18/2020   Diabetes mellitus ([place]) 10/08/2015   Formatting of this note might be different from the original. Overview:  last Hgb A1C 7.2/no blood sugars at home Formatting of this note might be different  from the original. last Hgb A1C 7.2/no blood sugars at home   ESR raised 11/27/2016   Frequent urination 12/16/2018   Glaucoma 10/08/2015   High cholesterol    History of pancreatitis 07/11/2014   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/23/2016   Hypertension 10/08/2015   IFG (impaired fasting glucose) 10/24/2019   Incomplete emptying of bladder 11/30/2017   Knee pain, left 12/22/2016   Loss of memory 01/18/2020   Major depressive disorder in partial remission ([place]) 10/08/2015   Morbid obesity ([place]) 10/23/2016   Obesity  (BMI 30-39.9) 11/10/2016   Obstructive sleep apnea 12/16/2018   Osteoarthritis of both knees 11/27/2016   Other insomnia 03/05/2017   Palpitations 10/23/2016   Positive ANA (antinuclear antibody) 10/23/2016   Sleep apnea 10/08/2015   Formatting of this note might be different from the original. Overview:  does not use CPAP Formatting of this note might be different from the original. does not use CPAP   Sore neck 10/24/2019   Spondylosis of cervical spine with myelopathy 01/18/2020   Type 2 diabetes mellitus without complication, without long-term current use of insulin ([place]) 12/16/2018   Vitamin D deficiency 10/08/2015   Past Surgical History:  Procedure Laterality Date   APPENDECTOMY     age 89   CESAREAN SECTION     X [place]  2015   SPINE SURGERY  05/29/2015   Social History:   reports that she has quit smoking. Her smoking use included cigarettes. She has a 10.00 pack-year smoking history. She has never used smokeless tobacco. She reports previous alcohol use. She reports previous drug use.  Family History  Problem Relation Age of Onset   Diabetes Mother    Multiple sclerosis Mother    Heart disease Father    Heart Problems Father    Migraines Daughter    Thyroid disease Daughter    Migraines Daughter     Medications: Patient's Medications  New Prescriptions   No medications on file  Previous Medications   LATANOPROST (XALATAN) 0.005 % OPHTHALMIC SOLUTION    Place 1 drop into both eyes at bedtime.   MELATONIN 5 MG CAPS    Take by mouth at bedtime.   MIRABEGRON ER (MYRBETRIQ) 25 MG TB24 TABLET    Take 1 tablet (25 mg total) by mouth daily.   OVER THE COUNTER MEDICATION    daily. Essential Oils   TEMAZEPAM (RESTORIL) 15 MG CAPSULE    Take 1 capsule by mouth at bedtime as needed. For sleep   TRAZODONE (DESYREL) 100 MG TABLET    Take one tablet by mouth once daily at bedtime. Needs an appointment before anymore future Refills.  Modified Medications   No medications on file   Discontinued Medications   No medications on file    Physical Exam:  Vitals:   03/12/21 1525  BP: (!) 160/90  Pulse: 80  Temp: (!) 97 F (36.1 C)  TempSrc: Temporal  Weight: 232 lb 9.6 oz (105.5 kg)  Height: 5' 7"  (1.702 m)   Body mass index is 36.43 kg/m. Wt Readings from Last 3 Encounters:  03/12/21 232 lb 9.6 oz (105.5 kg)  11/18/20 227 lb 9.6 oz (103.2 kg)  04/17/20 241 lb (109.3 kg)    Physical Exam Constitutional:      General: She is not in acute distress.    Appearance: She is well-developed. She is not diaphoretic.  HENT:     Head: Normocephalic and atraumatic.     Comments: Red scalp  Mouth/Throat:     Pharynx: No oropharyngeal exudate.  Eyes:     Conjunctiva/sclera: Conjunctivae normal.     Pupils: Pupils are equal, round, and reactive to light.  Cardiovascular:     Rate and Rhythm: Normal rate and regular rhythm.     Heart sounds: Normal heart sounds.  Pulmonary:     Effort: Pulmonary effort is normal.     Breath sounds: Normal breath sounds.  Abdominal:     General: Bowel sounds are normal.     Palpations: Abdomen is soft.  Musculoskeletal:     Cervical back: Normal range of motion and neck supple.     Right lower leg: No edema.     Left lower leg: No edema.  Skin:    General: Skin is warm and dry.  Neurological:     Mental Status: She is alert.  Psychiatric:        Mood and Affect: Mood normal.    Labs reviewed: Basic Metabolic Panel: No results for input(s): NA, K, CL, CO2, GLUCOSE, BUN, CREATININE, CALCIUM, MG, PHOS, TSH in the last 8760 hours. Liver Function Tests: No results for input(s): AST, ALT, ALKPHOS, BILITOT, PROT, ALBUMIN in the last 8760 hours. No results for input(s): LIPASE, AMYLASE in the last 8760 hours. No results for input(s): AMMONIA in the last 8760 hours. CBC: No results for input(s): WBC, NEUTROABS, HGB, HCT, MCV, PLT in the last 8760 hours. Lipid Panel: No results for input(s): CHOL, HDL, LDLCALC, TRIG,  CHOLHDL, LDLDIRECT in the last 8760 hours. TSH: No results for input(s): TSH in the last 8760 hours. A1C: No results found for: HGBA1C   Assessment/Plan 1. Type 2 diabetes mellitus without complication, without long-term current use of insulin (HCC) -Encouraged dietary compliance, routine foot care/monitoring and to keep up with diabetic eye exams through ophthalmology  - CBC with Differential/Platelet - CMP with eGFR([facility]) - Hemoglobin A1c  2. Overactive bladder -ongoing, interfering with quality of life and sleep.  - Ambulatory referral to Urology - mirabegron ER (MYRBETRIQ) 50 MG TB24 tablet; Take 1 tablet (50 mg total) by mouth daily.  Dispense: 30 tablet; Refill: 1  3. Insomnia, unspecified type -continues to have issues, handout given. Will also increase trazodone at this time.  - traZODone (DESYREL) 150 MG tablet; Take one tablet by mouth once daily at bedtime. Needs an appointment before anymore future Refills.  Dispense: 30 tablet; Refill: 1  4. Hyperlipidemia LDL goal <70 -recommended medication based on last LDL, she is not wanting to do this and will work on dietary modifications.  5. Seborrheic dermatitis of scalp - ketoconazole (NIZORAL) 2 % shampoo; Apply 1 application topically 2 (two) times a week.  Dispense: 120 mL; Refill: 0    Next appt: 3 months. Carlos American. [place], [facility] [phone]

## 2021-03-12 NOTE — Patient Instructions (Addendum)
To start heart healthy diet, avoid sweets and to control the amount of carbohydrates you eat (starchy foods such as flour, bread, and potatoes), avoid high fat dairy (like ice cream, whole milk, cheeses), can buy a heart healthy cook book from the american heart association to help with meal planning.    Heart-Healthy Eating Plan Many factors influence your heart (coronary) health, including eating and exercise habits. Coronary risk increases with abnormal blood fat (lipid) levels. Heart-healthy meal planning includes limiting unhealthy fats,increasing healthy fats, and making other diet and lifestyle changes. What are tips for following this plan? Cooking Cook foods using methods other than frying. Baking, boiling, grilling, and broiling are all good options. Other ways to reduce fat include: Removing the skin from poultry. Removing all visible fats from meats. Steaming vegetables in water or broth. Meal planning  At meals, imagine dividing your plate into fourths: Fill one-half of your plate with vegetables and green salads. Fill one-fourth of your plate with whole grains. Fill one-fourth of your plate with lean protein foods. Eat 4-5 servings of vegetables per day. One serving equals 1 cup raw or cooked vegetable, or 2 cups raw leafy greens. Eat 4-5 servings of fruit per day. One serving equals 1 medium whole fruit,  cup dried fruit,  cup fresh, frozen, or canned fruit, or  cup 100% fruit juice. Eat more foods that contain soluble fiber. Examples include apples, broccoli, carrots, beans, peas, and barley. Aim to get 25-30 g of fiber per day. Increase your consumption of legumes, nuts, and seeds to 4-5 servings per week. One serving of dried beans or legumes equals  cup cooked, 1 serving of nuts is  cup, and 1 serving of seeds equals 1 tablespoon.  Fats Choose healthy fats more often. Choose monounsaturated and polyunsaturated fats, such as olive and canola oils, flaxseeds,  walnuts, almonds, and seeds. Eat more omega-3 fats. Choose salmon, mackerel, sardines, tuna, flaxseed oil, and ground flaxseeds. Aim to eat fish at least 2 times each week. Check food labels carefully to identify foods with trans fats or high amounts of saturated fat. Limit saturated fats. These are found in animal products, such as meats, butter, and cream. Plant sources of saturated fats include palm oil, palm kernel oil, and coconut oil. Avoid foods with partially hydrogenated oils in them. These contain trans fats. Examples are stick margarine, some tub margarines, cookies, crackers, and other baked goods. Avoid fried foods. General information Eat more home-cooked food and less restaurant, buffet, and fast food. Limit or avoid alcohol. Limit foods that are high in starch and sugar. Lose weight if you are overweight. Losing just 5-10% of your body weight can help your overall health and prevent diseases such as diabetes and heart disease. Monitor your salt (sodium) intake, especially if you have high blood pressure. Talk with your health care provider about your sodium intake. Try to incorporate more vegetarian meals weekly. What foods can I eat? Fruits All fresh, canned (in natural juice), or frozen fruits. Vegetables Fresh or frozen vegetables (raw, steamed, roasted, or grilled). Green salads. Grains Most grains. Choose whole wheat and whole grains most of the time. Rice andpasta, including brown rice and pastas made with whole wheat. Meats and other proteins Lean, well-trimmed beef, veal, pork, and lamb. Chicken and [place] without skin. All fish and shellfish. Wild duck, rabbit, pheasant, and venison. Egg whites or low-cholesterol egg substitutes. Dried beans, peas, lentils, and tofu. Seedsand most nuts. Dairy Low-fat or nonfat cheeses, including ricotta  and mozzarella. Skim or 1% milk (liquid, powdered, or evaporated). Buttermilk made with low-fat milk. Nonfat orlow-fat yogurt. Fats  and oils Non-hydrogenated (trans-free) margarines. Vegetable oils, including soybean, sesame, sunflower, olive, peanut, safflower, corn, canola, and cottonseed. Salad dressings or mayonnaisemade with a vegetable oil. Beverages Water (mineral or sparkling). Coffee and tea. Diet carbonated beverages. Sweets and desserts Sherbet, gelatin, and fruit ice. Small amounts of dark chocolate. Limit all sweets and desserts. Seasonings and condiments All seasonings and condiments. The items listed above may not be a complete list of foods and beverages you can eat. Contact a dietitian for more options. What foods are not recommended? Fruits Canned fruit in heavy syrup. Fruit in cream or butter sauce. Fried fruit. Limitcoconut. Vegetables Vegetables cooked in cheese, cream, or butter sauce. Fried vegetables. Grains Breads made with saturated or trans fats, oils, or whole milk. Croissants. Sweet rolls. Donuts. High-fat crackers,such as cheese crackers. Meats and other proteins Fatty meats, such as hot dogs, ribs, sausage, bacon, rib-eye roast or steak. High-fat deli meats, such as salami and bologna. Caviar. Domestic duck andgoose. Organ meats, such as liver. Dairy Cream, sour cream, cream cheese, and creamed cottage cheese. Whole milk cheeses. Whole or 2% milk (liquid, evaporated, or condensed). Whole buttermilk.Cream sauce or high-fat cheese sauce. Whole-milk yogurt. Fats and oils Meat fat, or shortening. Cocoa butter, hydrogenated oils, palm oil, coconut oil, palm kernel oil. Solid fats and shortenings, including bacon fat, salt pork, lard, and butter. Nondairy cream substitutes. Salad dressings with cheeseor sour cream. Beverages Regular sodas and any drinks with added sugar. Sweets and desserts Frosting. Pudding. Cookies. Cakes. Pies. Milk chocolate or white chocolate.Buttered syrups. Full-fat ice cream or ice cream drinks. The items listed above may not be a complete list of foods and beverages to  avoid. Contact a dietitian for more information. Summary Heart-healthy meal planning includes limiting unhealthy fats, increasing healthy fats, and making other diet and lifestyle changes. Lose weight if you are overweight. Losing just 5-10% of your body weight can help your overall health and prevent diseases such as diabetes and heart disease. Focus on eating a balance of foods, including fruits and vegetables, low-fat or nonfat dairy, lean protein, nuts and legumes, whole grains, and heart-healthy oils and fats. This information is not intended to replace advice given to you by your health care provider. Make sure you discuss any questions you have with your healthcare provider. Document Revised: 09/24/2017 Document Reviewed: 09/24/2017 Elsevier Patient Education  2022 [employer]  

## 2021-03-13 LAB — CBC WITH DIFFERENTIAL/PLATELET
Absolute Monocytes: 546 cells/uL (ref 200–950)
Basophils Absolute: 18 cells/uL (ref 0–200)
Basophils Relative: 0.3 %
Eosinophils Absolute: 132 cells/uL (ref 15–500)
Eosinophils Relative: 2.2 %
HCT: 40.4 % (ref 35.0–45.0)
Hemoglobin: 13.4 g/dL (ref 11.7–15.5)
Lymphs Abs: 2004 cells/uL (ref 850–3900)
MCH: 31.3 pg (ref 27.0–33.0)
MCHC: 33.2 g/dL (ref 32.0–36.0)
MCV: 94.4 fL (ref 80.0–100.0)
MPV: 10.2 fL (ref 7.5–12.5)
Monocytes Relative: 9.1 %
Neutro Abs: 3300 cells/uL (ref 1500–7800)
Neutrophils Relative %: 55 %
Platelets: 244 10*3/uL (ref 140–400)
RBC: 4.28 10*6/uL (ref 3.80–5.10)
RDW: 12.4 % (ref 11.0–15.0)
Total Lymphocyte: 33.4 %
WBC: 6 10*3/uL (ref 3.8–10.8)

## 2021-03-13 LAB — COMPLETE METABOLIC PANEL WITH GFR
AG Ratio: 1.9 (calc) (ref 1.0–2.5)
ALT: 17 U/L (ref 6–29)
AST: 15 U/L (ref 10–35)
Albumin: 4.4 g/dL (ref 3.6–5.1)
Alkaline phosphatase (APISO): 54 U/L (ref 37–153)
BUN: 19 mg/dL (ref 7–25)
CO2: 26 mmol/L (ref 20–32)
Calcium: 9.1 mg/dL (ref 8.6–10.4)
Chloride: 103 mmol/L (ref 98–110)
Creat: 0.74 mg/dL (ref 0.60–1.00)
Globulin: 2.3 g/dL (calc) (ref 1.9–3.7)
Glucose, Bld: 126 mg/dL (ref 65–139)
Potassium: 3.9 mmol/L (ref 3.5–5.3)
Sodium: 139 mmol/L (ref 135–146)
Total Bilirubin: 0.3 mg/dL (ref 0.2–1.2)
Total Protein: 6.7 g/dL (ref 6.1–8.1)
eGFR: 84 mL/min/{1.73_m2} (ref >=60)

## 2021-03-13 LAB — HEMOGLOBIN A1C
Hgb A1c MFr Bld: 5.9 % of total Hgb — ABNORMAL HIGH (ref <5.7)
Mean Plasma Glucose: 123 mg/dL
eAG (mmol/L): 6.8 mmol/L

## 2021-03-14 ENCOUNTER — Telehealth: Payer: Self-pay | Admitting: *Deleted

## 2021-03-14 NOTE — Telephone Encounter (Signed)
Patient notified and agreed.  

## 2021-03-14 NOTE — Telephone Encounter (Signed)
We can occasionally give samples but will not be able to give them routinely. I am glad it is helping!

## 2021-03-14 NOTE — Telephone Encounter (Signed)
Patient called and stated that she saw Shanda Bumps and was given Myrebetriq. Stated that this medication has helped tremendously. Stated that she is sleeping now at night.   Stated that she went to the pharmacy to get it filled and it is going to cost her $100 oop with insurance and $600 without. Stated that she cannot afford the $100/month but would love to continue it because it is working.   Patient is wondering if we can give her samples.   Please Advise.

## 2021-03-27 ENCOUNTER — Telehealth (INDEPENDENT_AMBULATORY_CARE_PROVIDER_SITE_OTHER): Payer: Medicare HMO | Admitting: Nurse Practitioner

## 2021-03-27 ENCOUNTER — Telehealth: Payer: Self-pay

## 2021-03-27 ENCOUNTER — Other Ambulatory Visit: Payer: Self-pay

## 2021-03-27 DIAGNOSIS — U071 COVID-19: Secondary | ICD-10-CM

## 2021-03-27 NOTE — Progress Notes (Signed)
Careteam: Patient Care Team: Lauree Chandler, NP as PCP - General (Geriatric Medicine) Berniece Salines, DO as PCP - Cardiology (Cardiology) Murlean Iba, MD as Referring Physician (Orthopedic Surgery) Park Liter, MD as Consulting Physician (Cardiology)  Advanced Directive information    Allergies  Allergen Reactions   Amlodipine Swelling   Aripiprazole Other (See Comments)    Tongue twisted, lips curled up Tongue twisted, lips curled up    Carvedilol Other (See Comments)    Nightmares Nightmares    Paroxetine Hcl Other (See Comments)    . Marland Kitchen    Tizanidine Other (See Comments)    Syncope Syncope    Tramadol Other (See Comments)    Unknown   Zolpidem Other (See Comments)    Sleepwalking Sleepwalking     Chief Complaint  Patient presents with   Acute Visit    Patient tested positive for COVID on 03/27/2021. Patient is having fever,() bad sore throat, bad headache, vomiting and diarrhea. No shortness of breath. Symptoms started Tuesday. [place] daughter tested positive fo COVID. She was with her on Sunday.minor congestion. Patient experiencung weakness.     HPI: Patient is a 75 y.o. female for positive COVID. She did not take the COVID vaccine this is her 2nd time with COVID.  Fever 103 last night. Was unable to take tylenol due to her stomach being upset.  Headache and sore throat was severe last night but today somewhat better.  Nose has been running.   She was able to eat some today without nausea.  She is sipping on a electrolyte drink.   Unsure what temperature is now. No shortness of breath.   Review of Systems:  Review of Systems  Constitutional:  Positive for chills, fever and malaise/fatigue. Negative for weight loss.  HENT:  Positive for congestion, sinus pain and sore throat.   Respiratory:  Positive for cough. Negative for sputum production, shortness of breath and wheezing.   Cardiovascular:  Negative for chest pain and  palpitations.  Gastrointestinal:  Negative for abdominal pain, constipation, diarrhea and heartburn.  Genitourinary:  Negative for dysuria, frequency and urgency.  Skin: Negative.   Neurological:  Positive for speech change and headaches. Negative for dizziness.   Past Medical History:  Diagnosis Date   Abnormal EKG 10/23/2016   Arthritis 01/18/2020   Chronic fatigue 10/08/2015   Depression 01/18/2020   Diabetes mellitus ([place]) 10/08/2015   Formatting of this note might be different from the original. Overview:  last Hgb A1C 7.2/no blood sugars at home Formatting of this note might be different from the original. last Hgb A1C 7.2/no blood sugars at home   ESR raised 11/27/2016   Frequent urination 12/16/2018   Glaucoma 10/08/2015   High cholesterol    History of pancreatitis 07/11/2014   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/23/2016   Hypertension 10/08/2015   IFG (impaired fasting glucose) 10/24/2019   Incomplete emptying of bladder 11/30/2017   Knee pain, left 12/22/2016   Loss of memory 01/18/2020   Major depressive disorder in partial remission ([place]) 10/08/2015   Morbid obesity ([place]) 10/23/2016   Obesity (BMI 30-39.9) 11/10/2016   Obstructive sleep apnea 12/16/2018   Osteoarthritis of both knees 11/27/2016   Other insomnia 03/05/2017   Palpitations 10/23/2016   Positive ANA (antinuclear antibody) 10/23/2016   Sleep apnea 10/08/2015   Formatting of this note might be different from the original. Overview:  does not use CPAP Formatting of this note might be different from the original. does not use  CPAP   Sore neck 10/24/2019   Spondylosis of cervical spine with myelopathy 01/18/2020   Type 2 diabetes mellitus without complication, without long-term current use of insulin ([place]) 12/16/2018   Vitamin D deficiency 10/08/2015   Past Surgical History:  Procedure Laterality Date   APPENDECTOMY     age 13   CESAREAN SECTION     X 3   NECK SURGERY  2015   SPINE SURGERY  05/29/2015   Social History:   reports that she has  quit smoking. Her smoking use included cigarettes. She has a 10.00 pack-year smoking history. She has never used smokeless tobacco. She reports previous alcohol use. She reports previous drug use.  Family History  Problem Relation Age of Onset   Diabetes Mother    Multiple sclerosis Mother    Heart disease Father    Heart Problems Father    Migraines Daughter    Thyroid disease Daughter    Migraines Daughter     Medications: Patient's Medications  New Prescriptions   No medications on file  Previous Medications   KETOCONAZOLE (NIZORAL) 2 % SHAMPOO    Apply 1 application topically 2 (two) times a week.   LATANOPROST (XALATAN) 0.005 % OPHTHALMIC SOLUTION    Place 1 drop into both eyes at bedtime.   MELATONIN 5 MG CAPS    Take by mouth at bedtime.   MIRABEGRON ER (MYRBETRIQ) 50 MG TB24 TABLET    Take 1 tablet (50 mg total) by mouth daily.   OVER THE COUNTER MEDICATION    daily. Essential Oils   TEMAZEPAM (RESTORIL) 15 MG CAPSULE    Take 1 capsule by mouth at bedtime as needed. For sleep   TRAZODONE (DESYREL) 150 MG TABLET    Take one tablet by mouth once daily at bedtime. Needs an appointment before anymore future Refills.  Modified Medications   No medications on file  Discontinued Medications   No medications on file    Physical Exam:  There were no vitals filed for this visit. There is no height or weight on file to calculate BMI. Wt Readings from Last 3 Encounters:  03/12/21 232 lb 9.6 oz (105.5 kg)  11/18/20 227 lb 9.6 oz (103.2 kg)  04/17/20 241 lb (109.3 kg)    Physical Exam Constitutional:      Appearance: Normal appearance.  Pulmonary:     Effort: Pulmonary effort is normal.  Neurological:     Mental Status: She is alert and oriented to person, place, and time. Mental status is at baseline.  Psychiatric:        Mood and Affect: Mood normal.   limited due to virtual visit   Labs reviewed: Basic Metabolic Panel: Recent Labs    03/12/21 1601  NA 139  K 3.9   CL 103  CO2 26  GLUCOSE 126  BUN 19  CREATININE 0.74  CALCIUM 9.1   Liver Function Tests: Recent Labs    03/12/21 1601  AST 15  ALT 17  BILITOT 0.3  PROT 6.7   No results for input(s): LIPASE, AMYLASE in the last 8760 hours. No results for input(s): AMMONIA in the last 8760 hours. CBC: Recent Labs    03/12/21 1601  WBC 6.0  NEUTROABS 3,300  HGB 13.4  HCT 40.4  MCV 94.4  PLT 244   Lipid Panel: No results for input(s): CHOL, HDL, LDLCALC, TRIG, CHOLHDL, LDLDIRECT in the last 8760 hours. TSH: No results for input(s): TSH in the last 8760 hours. A1C: Lab  Results  Component Value Date   HGBA1C 5.9 (H) 03/12/2021     Assessment/Plan 1. COVID-19 -encouraged hydration.  -recommended to take Vit C 1000 mg twice daily, Vit D 5000 units daily, zinc 50 mg daily for 10 days -to do deep breathing exercises  -nasal wash daily and nasal saline PRN -mucinex DM twice daily as needed for chest congestion and cough.  May use tylenol 325 mg 2 tablets every 6 hours as needed aches and pains or sore throat -education provided on when to follow up and when to seek immediate medication attention through the ED.  -discussed treatment with paxlovid but she declines due to EUA and with GI symptoms already.   Carlos American. Harle Battiest  [facility] & Adult Medicine [phone]    Virtual Visit via Deloris Ping  I connected with patient on 03/27/21 at  2:45 PM EDT by video visit and verified that I am speaking with the correct person using two identifiers.  Location: Patient: home Provider: [facility]   I discussed the limitations, risks, security and privacy concerns of performing an evaluation and management service by telephone and the availability of in person appointments. I also discussed with the patient that there may be a patient responsible charge related to this service. The patient expressed understanding and agreed to proceed.   I discussed the assessment and  treatment plan with the patient. The patient was provided an opportunity to ask questions and all were answered. The patient agreed with the plan and demonstrated an understanding of the instructions.   The patient was advised to call back or seek an in-person evaluation if the symptoms worsen or if the condition fails to improve as anticipated.  I provided 20 minutes of non-face-to-face time during this encounter.  Carlos American. Harle Battiest Avs printed and mailed

## 2021-03-27 NOTE — Progress Notes (Signed)
This service is provided via telemedicine  No vital signs collected/recorded due to the encounter was a telemedicine visit.   Location of patient (ex: home, work):  Home  Patient consents to a telephone visit:  Yes, see encounter dated 03/27/2021  Location of the provider (ex: office, home):  [facility]  Name of any referring provider:  N/A  Names of all persons participating in the telemedicine service and their role in the encounter:  Abbey Chatters, Nurse Practitioner, Elveria Royals, CMA, and patient.   Time spent on call:  9 minutes with medical assistant

## 2021-03-27 NOTE — Telephone Encounter (Signed)
[name], Brenda Mcfarland are scheduled for a virtual visit with your provider today.    Just as we do with appointments in the office, we must obtain your consent to participate.  Your consent will be active for this visit and any virtual visit you may have with one of our providers in the next 365 days.    If you have a MyChart account, I can also send a copy of this consent to you electronically.  All virtual visits are billed to your insurance company just like a traditional visit in the office.  As this is a virtual visit, video technology does not allow for your provider to perform a traditional examination.  This may limit your provider's ability to fully assess your condition.  If your provider identifies any concerns that need to be evaluated in person or the need to arrange testing such as labs, EKG, etc, we will make arrangements to do so.    Although advances in technology are sophisticated, we cannot ensure that it will always work on either your end or our end.  If the connection with a video visit is poor, we may have to switch to a telephone visit.  With either a video or telephone visit, we are not always able to ensure that we have a secure connection.   I need to obtain your verbal consent now.   Are you willing to proceed with your visit today?   Brenda Mcfarland has provided verbal consent on 03/27/2021 for a virtual visit (video or telephone).   Elveria Royals, CMA 03/27/2021  3:11 PM

## 2021-03-28 ENCOUNTER — Telehealth: Payer: Self-pay | Admitting: *Deleted

## 2021-03-28 NOTE — Telephone Encounter (Signed)
Recommend her use mucinex DM by mouth twice daily with full glass of water- this is OTC

## 2021-03-28 NOTE — Telephone Encounter (Signed)
Patient notified and agreed.  

## 2021-03-28 NOTE — Telephone Encounter (Signed)
Patient called and stated that she had a video visit yesterday with Shanda Bumps for Positive Covid.   Stated that she has a cough that will not stop and wants to know if you would call her something into [facility] for this.   Please Advise.

## 2021-04-15 ENCOUNTER — Other Ambulatory Visit: Payer: Self-pay

## 2021-04-15 ENCOUNTER — Telehealth (INDEPENDENT_AMBULATORY_CARE_PROVIDER_SITE_OTHER): Payer: Medicare HMO | Admitting: Nurse Practitioner

## 2021-04-15 DIAGNOSIS — M542 Cervicalgia: Secondary | ICD-10-CM

## 2021-04-15 DIAGNOSIS — E119 Type 2 diabetes mellitus without complications: Secondary | ICD-10-CM

## 2021-04-15 DIAGNOSIS — B372 Candidiasis of skin and nail: Secondary | ICD-10-CM

## 2021-04-15 MED ORDER — NYSTATIN 100000 UNIT/GM EX CREA: 1.0000 "application " | TOPICAL_CREAM | Freq: Two times a day (BID) | CUTANEOUS | 0 refills | Status: DC

## 2021-04-15 NOTE — Patient Instructions (Signed)
Decrease sugar.  Increase water intake.

## 2021-04-15 NOTE — Progress Notes (Signed)
This service is provided via telemedicine  No vital signs collected/recorded due to the encounter was a telemedicine visit.   Location of patient (ex: home, work):  Home  Patient consents to a telephone visit:  Yes, see enocunter dated 03/27/2021  Location of the provider (ex: office, home):  [facility]  Name of any referring provider:  N/A  Names of all persons participating in the telemedicine service and their role in the encounter:  Abbey Chatters, Nurse Practitioner, Elveria Royals, CMA, and patient.   Time spent on call:  7 minutes with medical assistant

## 2021-04-15 NOTE — Progress Notes (Signed)
Careteam: Patient Care Team: Lauree Chandler, NP as PCP - General (Geriatric Medicine) Berniece Salines, DO as PCP - Cardiology (Cardiology) Murlean Iba, MD as Referring Physician (Orthopedic Surgery) Park Liter, MD as Consulting Physician (Cardiology)  Advanced Directive information    Allergies  Allergen Reactions   Amlodipine Swelling   Aripiprazole Other (See Comments)    Tongue twisted, lips curled up Tongue twisted, lips curled up    Carvedilol Other (See Comments)    Nightmares Nightmares    Paroxetine Hcl Other (See Comments)    . Marland Kitchen    Tizanidine Other (See Comments)    Syncope Syncope    Tramadol Other (See Comments)    Unknown   Zolpidem Other (See Comments)    Sleepwalking Sleepwalking     Chief Complaint  Patient presents with   Acute Visit    Patient would like referral for neck and shoulder discomfort.  Patient has had pain for years. Has gone chiropractor. She also did shots for the pain.No injury. History of spinal surgery. Patient has not taking anything for the discomfort.      HPI: Patient is a 75 y.o. female due to neck and shoulder discomfort.  She feels like a lot of her pain was caused by her chiropractor. Does not plan on going back to him.  Hx of cervical spine surgery with pain radiating into shoulder.  She has had shot in the past for her shoulder when she had pain raising her arm.  Pt reports her daughter has seen a specialist that has helped her with her neck pain.  Unsure when she was last evaluated for neck pain.  Does not trust a lot of providers with her medical needs.  Pain bilateral neck that radiates more on the right side down to her shoulder.  Has been using head and ice. Has a massager that she places on her neck that has really helped.  Uses a cream that has been helping and an all natural capsule.   She has had a major problems with yeast over the years. She has had to maintain a strict diet  with decreasing sugars and no yeast. She feels like yeast has "come back in" hard for her to get around and move around due to the yeast.  Dentist gave her ketoconazole   Review of Systems:  Review of Systems  Constitutional:  Positive for malaise/fatigue. Negative for chills, fever and weight loss.  Respiratory:  Negative for cough, sputum production and shortness of breath.   Cardiovascular:  Negative for chest pain, palpitations and leg swelling.  Gastrointestinal:  Negative for abdominal pain, constipation, diarrhea and heartburn.  Genitourinary:  Negative for dysuria, frequency and urgency.  Musculoskeletal:  Positive for myalgias and neck pain. Negative for back pain, falls and joint pain.  Skin: Negative.   Neurological:  Positive for headaches (occasional). Negative for dizziness and weakness.   Past Medical History:  Diagnosis Date   Abnormal EKG 10/23/2016   Arthritis 01/18/2020   Chronic fatigue 10/08/2015   Depression 01/18/2020   Diabetes mellitus ([place]) 10/08/2015   Formatting of this note might be different from the original. Overview:  last Hgb A1C 7.2/no blood sugars at home Formatting of this note might be different from the original. last Hgb A1C 7.2/no blood sugars at home   ESR raised 11/27/2016   Frequent urination 12/16/2018   Glaucoma 10/08/2015   High cholesterol    History of pancreatitis 07/11/2014   HSV-2 seropositive  10/23/2016   Hypertension 10/08/2015   IFG (impaired fasting glucose) 10/24/2019   Incomplete emptying of bladder 11/30/2017   Knee pain, left 12/22/2016   Loss of memory 01/18/2020   Major depressive disorder in partial remission ([place]) 10/08/2015   Morbid obesity ([place]) 10/23/2016   Obesity (BMI 30-39.9) 11/10/2016   Obstructive sleep apnea 12/16/2018   Osteoarthritis of both knees 11/27/2016   Other insomnia 03/05/2017   Palpitations 10/23/2016   Positive ANA (antinuclear antibody) 10/23/2016   Sleep apnea 10/08/2015   Formatting of this note might be different  from the original. Overview:  does not use CPAP Formatting of this note might be different from the original. does not use CPAP   Sore neck 10/24/2019   Spondylosis of cervical spine with myelopathy 01/18/2020   Type 2 diabetes mellitus without complication, without long-term current use of insulin ([place]) 12/16/2018   Vitamin D deficiency 10/08/2015   Past Surgical History:  Procedure Laterality Date   APPENDECTOMY     age 53   CESAREAN SECTION     X [place]  2015   SPINE SURGERY  05/29/2015   Social History:   reports that she has quit smoking. Her smoking use included cigarettes. She has a 10.00 pack-year smoking history. She has never used smokeless tobacco. She reports that she does not currently use alcohol. She reports that she does not currently use drugs.  Family History  Problem Relation Age of Onset   Diabetes Mother    Multiple sclerosis Mother    Heart disease Father    Heart Problems Father    Migraines Daughter    Thyroid disease Daughter    Migraines Daughter     Medications: Patient's Medications  New Prescriptions   No medications on file  Previous Medications   LATANOPROST (XALATAN) 0.005 % OPHTHALMIC SOLUTION    Place 1 drop into both eyes at bedtime.   MAGNESIUM GLYCINATE PO    Take 350 mg by mouth.   MELATONIN 5 MG TABS    Take by mouth at bedtime.   MIRABEGRON ER (MYRBETRIQ) 50 MG TB24 TABLET    Take 1 tablet (50 mg total) by mouth daily.   OVER THE COUNTER MEDICATION    daily. Essential Oils   TRAZODONE (DESYREL) 150 MG TABLET    Take one tablet by mouth once daily at bedtime. Needs an appointment before anymore future Refills.  Modified Medications   No medications on file  Discontinued Medications   KETOCONAZOLE (NIZORAL) 2 % SHAMPOO    Apply 1 application topically 2 (two) times a week.    Physical Exam:  There were no vitals filed for this visit. There is no height or weight on file to calculate BMI. Wt Readings from Last 3 Encounters:   03/12/21 232 lb 9.6 oz (105.5 kg)  11/18/20 227 lb 9.6 oz (103.2 kg)  04/17/20 241 lb (109.3 kg)     Labs reviewed: Basic Metabolic Panel: Recent Labs    03/12/21 1601  NA 139  K 3.9  CL 103  CO2 26  GLUCOSE 126  BUN 19  CREATININE 0.74  CALCIUM 9.1   Liver Function Tests: Recent Labs    03/12/21 1601  AST 15  ALT 17  BILITOT 0.3  PROT 6.7   No results for input(s): LIPASE, AMYLASE in the last 8760 hours. No results for input(s): AMMONIA in the last 8760 hours. CBC: Recent Labs    03/12/21 1601  WBC 6.0  NEUTROABS  3,300  HGB 13.4  HCT 40.4  MCV 94.4  PLT 244   Lipid Panel: No results for input(s): CHOL, HDL, LDLCALC, TRIG, CHOLHDL, LDLDIRECT in the last 8760 hours. TSH: No results for input(s): TSH in the last 8760 hours. A1C: Lab Results  Component Value Date   HGBA1C 5.9 (H) 03/12/2021     Assessment/Plan 1. Neck pain Ongoing, would like referral to PT Reports her daughter is going to someone that has helped her but unsure who this is- she will ask and notify our office.  -discussed further imaging but would like to hold off at this time.  - Ambulatory referral to Physical Therapy  2. Yeast infection of the skin Recurrent yeast to the breat and groin area. To stop destin and use nystatin - nystatin cream (MYCOSTATIN); Apply 1 application topically 2 (two) times daily.  Dispense: 30 g; Refill: 0  3. Type 2 diabetes mellitus without complication, without long-term current use of insulin (HCC) -has been eating more sugar at this time. Dietary modifications discussed.   Carlos American. Harle Battiest  [facility] & Adult Medicine [phone]    Virtual Visit via mychart video  I connected with patient on 04/15/21 at 11:30 AM EDT by video and verified that I am speaking with the correct person using two identifiers.  Location: Patient: home Provider: [facility]   I discussed the limitations, risks, security and privacy concerns of  performing an evaluation and management service by telephone and the availability of in person appointments. I also discussed with the patient that there may be a patient responsible charge related to this service. The patient expressed understanding and agreed to proceed.   I discussed the assessment and treatment plan with the patient. The patient was provided an opportunity to ask questions and all were answered. The patient agreed with the plan and demonstrated an understanding of the instructions.   The patient was advised to call back or seek an in-person evaluation if the symptoms worsen or if the condition fails to improve as anticipated.  I provided 25 minutes of non-face-to-face time during this encounter.  Carlos American. Harle Battiest Avs printed and mailed

## 2021-04-29 ENCOUNTER — Telehealth: Payer: Self-pay | Admitting: Nurse Practitioner

## 2021-04-29 NOTE — Telephone Encounter (Signed)
Patient called back and stated that she DOES NOT need a refill on her Trazadone at this time.   Also, wanted to speak with Misty Stanley regarding referral, Transferred call to [place].

## 2021-04-29 NOTE — Telephone Encounter (Signed)
Pt called wanting 2 things:  Refill on trazadone  Referral to [facility] PT in [place]  [phone]   f [phone]  Thanks, Rosezella [place]

## 2021-04-29 NOTE — Telephone Encounter (Signed)
Pt referral was placed 04/15/21

## 2021-05-01 NOTE — Telephone Encounter (Signed)
Please have her come into the office for an in office visit for evaluation of leg and hip pain.

## 2021-05-02 ENCOUNTER — Encounter: Payer: Self-pay | Admitting: Nurse Practitioner

## 2021-05-02 ENCOUNTER — Other Ambulatory Visit: Payer: Self-pay

## 2021-05-02 ENCOUNTER — Ambulatory Visit (INDEPENDENT_AMBULATORY_CARE_PROVIDER_SITE_OTHER): Payer: Medicare HMO | Admitting: Nurse Practitioner

## 2021-05-02 VITALS — BP 140/90 | HR 77 | Temp 97.5°F | Resp 18 | Ht 67.0 in | Wt 234.6 lb

## 2021-05-02 DIAGNOSIS — M5432 Sciatica, left side: Secondary | ICD-10-CM | POA: Diagnosis not present

## 2021-05-02 NOTE — Patient Instructions (Addendum)
Aleve 1 tablet twice daily with food for 1 week.  Tylenol 500 mg 1-2 tablets every 8 hours as needed for pain if aleve does not help.

## 2021-05-02 NOTE — Progress Notes (Signed)
Careteam: Patient Care Team: Lauree Chandler, NP as PCP - General (Geriatric Medicine) Berniece Salines, DO as PCP - Cardiology (Cardiology) Murlean Iba, MD as Referring Physician (Orthopedic Surgery) Park Liter, MD as Consulting Physician (Cardiology)  PLACE OF SERVICE:  [place] Directive information Does Patient Have a Medical Advance Directive?: No, Would patient like information on creating a medical advance directive?: No - Patient declined  Allergies  Allergen Reactions   Amlodipine Swelling   Aripiprazole Other (See Comments)    Tongue twisted, lips curled up Tongue twisted, lips curled up    Carvedilol Other (See Comments)    Nightmares Nightmares    Paroxetine Hcl Other (See Comments)    . Marland Kitchen    Tizanidine Other (See Comments)    Syncope Syncope    Tramadol Other (See Comments)    Unknown   Zolpidem Other (See Comments)    Sleepwalking Sleepwalking     Chief Complaint  Patient presents with   Acute Visit    Patient complains of leg, back, and hip pain. Mainly on the left side.     HPI: Patient is a 75 y.o. female requesting PT Reports she has tingling into leg on left side.  Reports serious pain to left side. Reports he went to chiropractor and feels like things got worse. Reports daughter suggested PT.  Uncomfortable when she is sitting. Hard to get up get things done when she is sitting. Over the last few days has been very challenging.  Sciatica since her 30s  Review of Systems:  Review of Systems  Constitutional:  Negative for chills and fever.  Gastrointestinal:  Negative for abdominal pain, constipation and diarrhea.  Genitourinary:  Negative for dysuria, frequency and urgency.  Musculoskeletal:  Positive for back pain.  Neurological:  Positive for tingling.   Past Medical History:  Diagnosis Date   Abnormal EKG 10/23/2016   Arthritis 01/18/2020   Chronic fatigue 10/08/2015   Depression 01/18/2020    Diabetes mellitus ([place]) 10/08/2015   Formatting of this note might be different from the original. Overview:  last Hgb A1C 7.2/no blood sugars at home Formatting of this note might be different from the original. last Hgb A1C 7.2/no blood sugars at home   ESR raised 11/27/2016   Frequent urination 12/16/2018   Glaucoma 10/08/2015   High cholesterol    History of pancreatitis 07/11/2014   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/23/2016   Hypertension 10/08/2015   IFG (impaired fasting glucose) 10/24/2019   Incomplete emptying of bladder 11/30/2017   Knee pain, left 12/22/2016   Loss of memory 01/18/2020   Major depressive disorder in partial remission ([place]) 10/08/2015   Morbid obesity ([place]) 10/23/2016   Obesity (BMI 30-39.9) 11/10/2016   Obstructive sleep apnea 12/16/2018   Osteoarthritis of both knees 11/27/2016   Other insomnia 03/05/2017   Palpitations 10/23/2016   Positive ANA (antinuclear antibody) 10/23/2016   Sleep apnea 10/08/2015   Formatting of this note might be different from the original. Overview:  does not use CPAP Formatting of this note might be different from the original. does not use CPAP   Sore neck 10/24/2019   Spondylosis of cervical spine with myelopathy 01/18/2020   Type 2 diabetes mellitus without complication, without long-term current use of insulin ([place]) 12/16/2018   Vitamin D deficiency 10/08/2015   Past Surgical History:  Procedure Laterality Date   APPENDECTOMY     age 89   CESAREAN SECTION     X 3  NECK SURGERY  2015   SPINE SURGERY  05/29/2015   Social History:   reports that she has quit smoking. Her smoking use included cigarettes. She has a 10.00 pack-year smoking history. She has never used smokeless tobacco. She reports that she does not currently use alcohol. She reports that she does not currently use drugs.  Family History  Problem Relation Age of Onset   Diabetes Mother    Multiple sclerosis Mother    Heart disease Father    Heart Problems Father    Migraines Daughter     Thyroid disease Daughter    Migraines Daughter     Medications: Patient's Medications  New Prescriptions   No medications on file  Previous Medications   LATANOPROST (XALATAN) 0.005 % OPHTHALMIC SOLUTION    Place 1 drop into both eyes at bedtime.   MAGNESIUM GLYCINATE PO    Take 350 mg by mouth.   MELATONIN 5 MG TABS    Take by mouth at bedtime.   MIRABEGRON ER (MYRBETRIQ) 50 MG TB24 TABLET    Take 1 tablet (50 mg total) by mouth daily.   NYSTATIN CREAM (MYCOSTATIN)    Apply 1 application topically 2 (two) times daily.   OVER THE COUNTER MEDICATION    daily. Essential Oils   TRAZODONE (DESYREL) 150 MG TABLET    Take one tablet by mouth once daily at bedtime. Needs an appointment before anymore future Refills.  Modified Medications   No medications on file  Discontinued Medications   No medications on file    Physical Exam:  Vitals:   05/02/21 1434  BP: 140/90  Pulse: 77  Resp: 18  Temp: (!) 97.5 F (36.4 C)  SpO2: 97%  Weight: 234 lb 9.6 oz (106.4 kg)  Height: 5' 7"  (1.702 m)   Body mass index is 36.74 kg/m. Wt Readings from Last 3 Encounters:  05/02/21 234 lb 9.6 oz (106.4 kg)  03/12/21 232 lb 9.6 oz (105.5 kg)  11/18/20 227 lb 9.6 oz (103.2 kg)    Physical Exam Constitutional:      General: She is not in acute distress.    Appearance: She is well-developed. She is not diaphoretic.  HENT:     Head: Normocephalic and atraumatic.     Mouth/Throat:     Pharynx: No oropharyngeal exudate.  Eyes:     Conjunctiva/sclera: Conjunctivae normal.     Pupils: Pupils are equal, round, and reactive to light.  Cardiovascular:     Rate and Rhythm: Normal rate and regular rhythm.     Heart sounds: Normal heart sounds.  Pulmonary:     Effort: Pulmonary effort is normal.     Breath sounds: Normal breath sounds.  Abdominal:     General: Bowel sounds are normal.     Palpations: Abdomen is soft.  Musculoskeletal:     Cervical back: Normal range of motion and neck supple.      Lumbar back: Tenderness present.       Back:     Right lower leg: No edema.     Left lower leg: No edema.  Skin:    General: Skin is warm and dry.  Neurological:     Mental Status: She is alert.  Psychiatric:        Mood and Affect: Mood normal.    Labs reviewed: Basic Metabolic Panel: Recent Labs    03/12/21 1601  NA 139  K 3.9  CL 103  CO2 26  GLUCOSE 126  BUN 19  CREATININE 0.74  CALCIUM 9.1   Liver Function Tests: Recent Labs    03/12/21 1601  AST 15  ALT 17  BILITOT 0.3  PROT 6.7   No results for input(s): LIPASE, AMYLASE in the last 8760 hours. No results for input(s): AMMONIA in the last 8760 hours. CBC: Recent Labs    03/12/21 1601  WBC 6.0  NEUTROABS 3,300  HGB 13.4  HCT 40.4  MCV 94.4  PLT 244   Lipid Panel: No results for input(s): CHOL, HDL, LDLCALC, TRIG, CHOLHDL, LDLDIRECT in the last 8760 hours. TSH: No results for input(s): TSH in the last 8760 hours. A1C: Lab Results  Component Value Date   HGBA1C 5.9 (H) 03/12/2021     Assessment/Plan 1. Sciatica, left side Aleve 1 tablet twice daily with food for 1 week then stop Tylenol 500 mg 1-2 tablets every 8 hours as needed for pain if aleve does not help.  - Ambulatory referral to Physical Therapy - DG Lumbar Spine Complete; Future -return precautions given.   Carlos American. [place] [phone]

## 2021-06-03 ENCOUNTER — Other Ambulatory Visit: Payer: Self-pay | Admitting: *Deleted

## 2021-06-03 ENCOUNTER — Encounter: Payer: Self-pay | Admitting: Physical Therapy

## 2021-06-03 ENCOUNTER — Ambulatory Visit: Payer: Medicare HMO | Attending: Nurse Practitioner | Admitting: Physical Therapy

## 2021-06-03 ENCOUNTER — Other Ambulatory Visit: Payer: Self-pay

## 2021-06-03 DIAGNOSIS — M6281 Muscle weakness (generalized): Secondary | ICD-10-CM | POA: Insufficient documentation

## 2021-06-03 DIAGNOSIS — M5442 Lumbago with sciatica, left side: Secondary | ICD-10-CM | POA: Diagnosis not present

## 2021-06-03 DIAGNOSIS — R252 Cramp and spasm: Secondary | ICD-10-CM | POA: Diagnosis not present

## 2021-06-03 DIAGNOSIS — R2689 Other abnormalities of gait and mobility: Secondary | ICD-10-CM | POA: Insufficient documentation

## 2021-06-03 DIAGNOSIS — G8929 Other chronic pain: Secondary | ICD-10-CM | POA: Insufficient documentation

## 2021-06-03 DIAGNOSIS — G4700 Insomnia, unspecified: Secondary | ICD-10-CM

## 2021-06-03 MED ORDER — TRAZODONE HCL 150 MG PO TABS: ORAL_TABLET | ORAL | 1 refills | Status: DC

## 2021-06-03 NOTE — Telephone Encounter (Signed)
[facility] requested refill.  

## 2021-06-03 NOTE — Therapy (Signed)
[facility] [address]  [place], [place], [address] Phone: [phone]   Fax:  [phone]  Physical Therapy Evaluation  Patient Details  Name: Brenda Mcfarland MRN: [id number] Date of Birth: 1945-09-12 Referring Provider (PT): Sherrie Mustache   Encounter Date: 06/03/2021   PT End of Session - 06/03/21 1208     Visit Number 1    Number of Visits 12    Date for PT Re-Evaluation 07/15/21    Authorization Type Medicare +    Progress Note Due on Visit 10    PT Start Time 1105    PT Stop Time 1150    PT Time Calculation (min) 45 min    Activity Tolerance Patient tolerated treatment well;Patient limited by pain    Behavior During Therapy [facility] for tasks assessed/performed             Past Medical History:  Diagnosis Date   Abnormal EKG 10/23/2016   Arthritis 01/18/2020   Chronic fatigue 10/08/2015   Depression 01/18/2020   Diabetes mellitus ([place]) 10/08/2015   Formatting of this note might be different from the original. Overview:  last Hgb A1C 7.2/no blood sugars at home Formatting of this note might be different from the original. last Hgb A1C 7.2/no blood sugars at home   ESR raised 11/27/2016   Frequent urination 12/16/2018   Glaucoma 10/08/2015   High cholesterol    History of pancreatitis 07/11/2014   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/23/2016   Hypertension 10/08/2015   IFG (impaired fasting glucose) 10/24/2019   Incomplete emptying of bladder 11/30/2017   Knee pain, left 12/22/2016   Loss of memory 01/18/2020   Major depressive disorder in partial remission ([place]) 10/08/2015   Morbid obesity ([place]) 10/23/2016   Obesity (BMI 30-39.9) 11/10/2016   Obstructive sleep apnea 12/16/2018   Osteoarthritis of both knees 11/27/2016   Other insomnia 03/05/2017   Palpitations 10/23/2016   Positive ANA (antinuclear antibody) 10/23/2016   Sleep apnea 10/08/2015   Formatting of this note might be different from the original. Overview:  does not use CPAP  Formatting of this note might be different from the original. does not use CPAP   Sore neck 10/24/2019   Spondylosis of cervical spine with myelopathy 01/18/2020   Type 2 diabetes mellitus without complication, without long-term current use of insulin ([place]) 12/16/2018   Vitamin D deficiency 10/08/2015    Past Surgical History:  Procedure Laterality Date   APPENDECTOMY     age 61   CESAREAN SECTION     X 3   NECK SURGERY  2015   SPINE SURGERY  05/29/2015    There were no vitals filed for this visit.    Subjective Assessment - 06/03/21 1121     Subjective Pt reports she has had back pain for years and years, but used to have good chiropractor and was doing well, but had to go to another chiropractor but felt he was making her worse so stopped going.  S tripped on the steps last week and fell up the stairs which flared up her back.    Pertinent History chronic low back pain, neck pain, previous neck surgery, recent COVID infections, brain fog, memory impairments, T2DM, OA bil knees, glaucoma    Limitations Walking;Lifting;Standing;House hold activities    How long can you sit comfortably? as long as chair is comfortable chair    How long can you stand comfortably? 5 minutes    How long can you walk  comfortably? down driveway > 50 feet    Diagnostic tests x-rays with chiropracter    Patient Stated Goals be coordinated, stand up straight, walk around the block, work on balance    Currently in Pain? Yes    Pain Score 5    8/10 if lays on L side   Pain Location Back    Pain Orientation Lower    Pain Descriptors / Indicators Aching;Sharp    Pain Type Acute pain;Chronic pain    Pain Radiating Towards radiates down L side usually to knee, but has discomfort in her calf as well.  Also has pain go into groin    Pain Onset Other (comment)   years   Pain Frequency Constant    Aggravating Factors  laying on left side, standing and walking    Pain Relieving Factors sitting    Effect of Pain on  Daily Activities has to shop places that have a cart                [facility] PT Assessment - 06/03/21 0001       Assessment   Medical Diagnosis L sided sciatica    Referring Provider (PT) Sherrie Mustache    Onset Date/Surgical Date --   chronic, years   Next MD Visit none scheduled    Prior Therapy PT in 2017 for back      Precautions   Precautions None      Restrictions   Weight Bearing Restrictions No      Balance Screen   Has the patient fallen in the past 6 months Yes    How many times? 1    Has the patient had a decrease in activity level because of a fear of falling?  Yes    Is the patient reluctant to leave their home because of a fear of falling?  Yes      [place] residence    Living Arrangements Alone    Type of [place] to enter    Entrance Stairs-Number of Steps 3    Entrance Stairs-Rails Right;Left;Can reach both    [place] Two level   2 steps down to bedroom   [place] - 2 wheels;[place] - single point      Prior Function   Level of Independence Independent    Vocation Retired    Leisure involved in church, sewing quilts, dinner with friends      Cognition   Overall Cognitive Status No family/caregiver present to determine baseline cognitive functioning      Observation/Other Assessments   Observations enters independently without AD, no apparent distress    Focus on Therapeutic Outcomes (FOTO)  lumbar 45, 53 predicted at discharge after 12 visits.      Posture/Postural Control   Posture/Postural Control Postural limitations    Postural Limitations Rounded Shoulders;Forward head;Decreased lumbar lordosis      ROM / Strength   AROM / PROM / Strength AROM;Strength      AROM   Overall AROM  Deficits    AROM Assessment Site Lumbar    Lumbar Flexion WNL, increased pain L side    Lumbar Extension decreased but relieves pain    Lumbar - Right Side Bend WNL    Lumbar - Left  Side Bend WNL    Lumbar - Right Rotation WNL, tightness L side    Lumbar - Left Rotation WNL      Strength  Overall Strength Deficits    Overall Strength Comments tested in sitting    Strength Assessment Site Hip;Knee;Ankle    Right/Left Hip Right;Left    Right Hip Flexion 4/5    Right Hip ABduction 5/5    Right Hip ADduction 5/5    Left Hip Flexion 4/5    Left Hip ABduction 5/5    Left Hip ADduction 5/5    Right/Left Knee Right;Left    Right Knee Extension 5/5    Left Knee Extension 5/5    Right/Left Ankle Right;Left    Right Ankle Dorsiflexion 5/5    Left Ankle Dorsiflexion 5/5      Flexibility   Soft Tissue Assessment /Muscle Length yes    Hamstrings tightness bil, L>R    Quadriceps tightness bil with prone knee bend    Piriformis tight L      Palpation   Spinal mobility decreased lordotic curvature    SI assessment  tenderness L SIJ    Palpation comment increased tightness throughout lumbopelvic musculature on R (piriformis, glute med, QL, lumbar paraspinals)      Special Tests   Other special tests leg length discrepency LLE shorter than RLE both supine and sitting      Transfers   Five time sit to stand comments  18 seconds with hands on thighs                        Objective measurements completed on examination: See above findings.       [place] [facility] PT Treatment/Exercise - 06/03/21 0001       Lumbar Exercises: Standing   Other Standing Lumbar Exercises standing repeated extension with wall x 10      Lumbar Exercises: Supine   Other Supine Lumbar Exercises figure 4 piriformis stretch      Lumbar Exercises: Prone   Other Prone Lumbar Exercises hamstring curls x 10 bil    Other Prone Lumbar Exercises progressing from prone, to prone on elbows                     PT Education - 06/03/21 1207     Education Details findings, POC, initial HEP    Person(s) Educated Patient    Methods Explanation;Demonstration;Handout;Verbal  cues    Comprehension Verbalized understanding;Returned demonstration;Need further instruction              PT Short Term Goals - 06/03/21 1219       PT SHORT TERM GOAL #1   Title Ind with initial HEP    Time 2    Period Weeks    Status New    Target Date 06/17/21      PT SHORT TERM GOAL #2   Title Complete DGI/Berg to check for balance impairments.    Time 2    Period Weeks    Status New    Target Date 06/17/21               PT Long Term Goals - 06/03/21 1220       PT LONG TERM GOAL #1   Title Ind with progressed HEP to improve outcomes.    Time 6    Period Weeks    Status New    Target Date 07/15/21      PT LONG TERM GOAL #2   Title Improve walking tolerance to 1000' without limitation from back/leg pain    Baseline <50'    Time 6  Period Weeks    Status New    Target Date 07/15/21      PT LONG TERM GOAL #3   Title Demonstrate low fall risk on DGI/Berg with goal TBD    Time 6    Period Weeks    Status New    Target Date 07/15/21      PT LONG TERM GOAL #4   Title Pt. will report 75% improvement in low back pain.    Time 6    Period Weeks    Status New    Target Date 07/15/21                    Plan - 06/03/21 1209     Clinical Impression Statement Ahmoni Edge is a 75 year old female referred for L sided sciatica.  She reports this is a chronic problem but worsened recently after a fall.  She reports brain fog and balance difficulties following recent COVID infection.  Due to time contraints unable to assess balance today but will next session.  She demonstrates low back pain, L SIJ dysfunction, possible leg length discrepency/adaptive shorting of lumbopelvic musculature and decresed hip strength.  She demonstrated extension preference but then reported increased pain end of session in L groin.  Given initial HEP for extension based exercises but cautioned to perform to tolerance, not push through pain, stop if symptoms increase and  alert PT next session.  She would benefit from skilled physical therapy to decrease low back pain and improve activity/standing/walking tolerance.    Personal Factors and Comorbidities Age;Comorbidity 3+;Other    Comorbidities history neck pain/surgery, chronic LBP, recent COVID infection with brain fog, glaucoma, obesity, bil knee OA, T2DM.    Examination-Activity Limitations Bed Mobility;Stand;Sleep;Locomotion Level;Carry;Bend    Examination-Participation Restrictions Church;Community Activity;Shop;Cleaning    Stability/Clinical Decision Making Evolving/Moderate complexity    Clinical Decision Making Moderate    Rehab Potential Good    PT Frequency 2x / week    PT Duration 6 weeks    PT Treatment/Interventions ADLs/Self Care Home Management;Cryotherapy;Electrical Stimulation;Iontophoresis 56m/ml Dexamethasone;Moist Heat;Traction;Ultrasound;Gait training;Stair training;Functional mobility training;Therapeutic activities;Therapeutic exercise;Balance training;Neuromuscular re-education;Patient/family education;Manual techniques;Passive range of motion;Dry needling;Taping;Joint Manipulations;Spinal Manipulations    PT Next Visit Plan DGI/Berg, progress HEP, manual to low back, modalities PRN    PT Home Exercise Plan Access Code: [email or web address]   Consulted and Agree with Plan of Care Patient             Patient will benefit from skilled therapeutic intervention in order to improve the following deficits and impairments:  Decreased activity tolerance, Decreased endurance, Decreased range of motion, Decreased strength, Increased fascial restricitons, Improper body mechanics, Pain, Postural dysfunction, Impaired flexibility, Increased muscle spasms, Decreased balance, Decreased coordination, Decreased safety awareness  Visit Diagnosis: Chronic left-sided low back pain with left-sided sciatica  Other abnormalities of gait and mobility  Muscle weakness (generalized)  Cramp and spasm     Problem  List Patient Active Problem List   Diagnosis Date Noted   Paroxysmal SVT (supraventricular tachycardia) ([place] 01/22/2020   Arthritis 01/18/2020   Depression 01/18/2020   Loss of memory 01/18/2020   Spondylosis of cervical spine with myelopathy 01/18/2020   IFG (impaired fasting glucose) 10/24/2019   Sore neck 10/24/2019   Frequent urination 12/16/2018   Obstructive sleep apnea 12/16/2018   Type 2 diabetes mellitus without complication, without long-term current use of insulin ([place] 12/16/2018   Incomplete emptying of bladder 11/30/2017   Other insomnia 03/05/2017   Knee pain,  left 12/22/2016   ESR raised 11/27/2016   Osteoarthritis of both knees 11/27/2016   Obesity (BMI 30-39.9) 11/10/2016   Abnormal EKG 10/23/2016   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/23/2016   Morbid obesity ([place]) 10/23/2016   Palpitations 10/23/2016   Positive ANA (antinuclear antibody) 10/23/2016   Hypertension 10/08/2015   Chronic fatigue 10/08/2015   Diabetes mellitus ([place]) 10/08/2015   Glaucoma 10/08/2015   Major depressive disorder in partial remission ([place]) 10/08/2015   Vitamin D deficiency 10/08/2015   Sleep apnea 10/08/2015   History of pancreatitis 07/11/2014    Rennie Natter, PT, DPT 06/03/2021, 12:32 PM  [facility] [address]  [place], [place], [address] Phone: [phone]   Fax:  [phone]  Name: Cherre Kothari MRN: [id number] Date of Birth: December 28, 1945

## 2021-06-03 NOTE — Patient Instructions (Signed)
Access Code: [email or web address] URL: [email or web address] Date: 06/03/2021 Prepared by: Harrie Foreman  Exercises Standing Lumbar Extension at Wall - Forearms - 1 x daily - 7 x weekly - 2 sets - 10 reps Prone Press Up - 1 x daily - 7 x weekly - 2 sets - 10 reps Prone Knee Flexion - 1 x daily - 7 x weekly - 2 sets - 10 reps Supine Figure 4 Piriformis Stretch - 1 x daily - 7 x weekly - 1 sets - 3 reps - 15 sec hold

## 2021-06-06 ENCOUNTER — Encounter: Payer: Self-pay | Admitting: Physical Therapy

## 2021-06-06 ENCOUNTER — Ambulatory Visit: Payer: Medicare HMO | Admitting: Physical Therapy

## 2021-06-06 ENCOUNTER — Other Ambulatory Visit: Payer: Self-pay

## 2021-06-06 DIAGNOSIS — R252 Cramp and spasm: Secondary | ICD-10-CM | POA: Diagnosis not present

## 2021-06-06 DIAGNOSIS — M6281 Muscle weakness (generalized): Secondary | ICD-10-CM

## 2021-06-06 DIAGNOSIS — R2689 Other abnormalities of gait and mobility: Secondary | ICD-10-CM | POA: Diagnosis not present

## 2021-06-06 DIAGNOSIS — M5442 Lumbago with sciatica, left side: Secondary | ICD-10-CM | POA: Diagnosis not present

## 2021-06-06 DIAGNOSIS — G8929 Other chronic pain: Secondary | ICD-10-CM | POA: Diagnosis not present

## 2021-06-06 NOTE — Patient Instructions (Signed)
Access Code: [email or web address] URL: [email or web address] Date: 06/06/2021 Prepared by: Harrie Foreman  Exercises Supine Lower Trunk Rotation - 1 x daily - 7 x weekly - 2 sets - 10 reps Supine Posterior Pelvic Tilt - 1 x daily - 7 x weekly - 2 sets - 10 reps - 5 sec hold Seated Flexion Stretch with Swiss Ball - 1 x daily - 7 x weekly - 1 sets - 10 reps Seated Trunk Rotation - Arms Crossed - 1 x daily - 7 x weekly - 2 sets - 10 reps

## 2021-06-06 NOTE — Therapy (Signed)
[facility] [address]  [place], [place], [address] Phone: [phone]   Fax:  [phone]  Physical Therapy Treatment  Patient Details  Name: Brenda Mcfarland MRN: [id number] Date of Birth: 1946/04/17 Referring Provider (PT): Sherrie Mustache   Encounter Date: 06/06/2021   PT End of Session - 06/06/21 1204     Visit Number 2    Number of Visits 12    Date for PT Re-Evaluation 07/15/21    Authorization Type Medicare + State Health    Progress Note Due on Visit 10    PT Start Time 1105    PT Stop Time 1145    PT Time Calculation (min) 40 min    Activity Tolerance Patient tolerated treatment well    Behavior During Therapy [facility] for tasks assessed/performed             Past Medical History:  Diagnosis Date   Abnormal EKG 10/23/2016   Arthritis 01/18/2020   Chronic fatigue 10/08/2015   Depression 01/18/2020   Diabetes mellitus ([place]) 10/08/2015   Formatting of this note might be different from the original. Overview:  last Hgb A1C 7.2/no blood sugars at home Formatting of this note might be different from the original. last Hgb A1C 7.2/no blood sugars at home   ESR raised 11/27/2016   Frequent urination 12/16/2018   Glaucoma 10/08/2015   High cholesterol    History of pancreatitis 07/11/2014   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/23/2016   Hypertension 10/08/2015   IFG (impaired fasting glucose) 10/24/2019   Incomplete emptying of bladder 11/30/2017   Knee pain, left 12/22/2016   Loss of memory 01/18/2020   Major depressive disorder in partial remission ([place]) 10/08/2015   Morbid obesity ([place]) 10/23/2016   Obesity (BMI 30-39.9) 11/10/2016   Obstructive sleep apnea 12/16/2018   Osteoarthritis of both knees 11/27/2016   Other insomnia 03/05/2017   Palpitations 10/23/2016   Positive ANA (antinuclear antibody) 10/23/2016   Sleep apnea 10/08/2015   Formatting of this note might be different from the original. Overview:  does not use CPAP Formatting of  this note might be different from the original. does not use CPAP   Sore neck 10/24/2019   Spondylosis of cervical spine with myelopathy 01/18/2020   Type 2 diabetes mellitus without complication, without long-term current use of insulin ([place]) 12/16/2018   Vitamin D deficiency 10/08/2015    Past Surgical History:  Procedure Laterality Date   APPENDECTOMY     age 18   CESAREAN SECTION     X 3   NECK SURGERY  2015   SPINE SURGERY  05/29/2015    There were no vitals filed for this visit.   Subjective Assessment - 06/06/21 1111     Subjective Patient reports she had excrutiating pain following PT evaluation.  Concerned that PT is going to cause her too much pain and make her worse.    Pertinent History chronic low back pain, neck pain, previous neck surgery, recent COVID infections, brain fog, memory impairments, T2DM, OA bil knees, glaucoma    Limitations Walking;Lifting;Standing;House hold activities    How long can you sit comfortably? as long as chair is comfortable chair    How long can you stand comfortably? 5 minutes    How long can you walk comfortably? down driveway > 50 feet    Diagnostic tests x-rays with chiropracter    Patient Stated Goals be coordinated, stand up straight, walk around the block, work on balance  Currently in Pain? Yes    Pain Score 1     Pain Location Back    Pain Orientation Lower    Pain Onset Other (comment)   years                              [facility] Adult PT Treatment/Exercise - 06/06/21 0001       Lumbar Exercises: Aerobic   Nustep L5 x 6 min      Lumbar Exercises: Standing   Other Standing Lumbar Exercises standing side glides x 10 bil, standing lumbar extensions x 10, reported increased pain after so discontinued      Lumbar Exercises: Seated   Other Seated Lumbar Exercises seated arm raises with weight shift x 10, seated leg lifts x 10 with weight shift, focusing side of preference.  Upper trunk rotations seated side  of preference.    Other Seated Lumbar Exercises flexion stretch with swiss ball, decreased pain.      Lumbar Exercises: Supine   Pelvic Tilt 10 reps    Pelvic Tilt Limitations cues for TrA contraction    Other Supine Lumbar Exercises lower trunk rotations x 10 side of preference                     PT Education - 06/06/21 1212     Education Details HEP updated Access Code: [email or web address]    Person(s) Educated Patient    Methods Explanation;Demonstration;Handout;Verbal cues    Comprehension Verbalized understanding;Returned demonstration;Need further instruction              PT Short Term Goals - 06/06/21 1211       PT SHORT TERM GOAL #1   Title Ind with initial HEP    Time 2    Period Weeks    Status On-going   06/06/21 - did not do due to pain, new exercises given today   Target Date 06/17/21      PT SHORT TERM GOAL #2   Title Complete DGI/Berg to check for balance impairments.    Time 2    Period Weeks    Status On-going    Target Date 06/17/21               PT Long Term Goals - 06/03/21 1220       PT LONG TERM GOAL #1   Title Ind with progressed HEP to improve outcomes.    Time 6    Period Weeks    Status New    Target Date 07/15/21      PT LONG TERM GOAL #2   Title Improve walking tolerance to 1000' without limitation from back/leg pain    Baseline <50'    Time 6    Period Weeks    Status New    Target Date 07/15/21      PT LONG TERM GOAL #3   Title Demonstrate low fall risk on DGI/Berg with goal TBD    Time 6    Period Weeks    Status New    Target Date 07/15/21      PT LONG TERM GOAL #4   Title Pt. will report 75% improvement in low back pain.    Time 6    Period Weeks    Status New    Target Date 07/15/21                   Plan -  06/06/21 1205     Clinical Impression Statement Patient participated in exercise today, but initially very concerned due to complaint of "excrutiating" pain following initial evalution,  although today has subsided and reports minimal pain.  She is very focused on pain and reported increased sharp pain after ~ 10 reps of any exercise, but tolerance improved when focused on performing exercises to side of preference then switching sides.  She has a balance disk at home, so discussed sitting on this while performing exercises to encourage weight shifting for lumbopelvic activation.  She would benefit from continued skilled therapy.    Personal Factors and Comorbidities Age;Comorbidity 3+;Other    Comorbidities history neck pain/surgery, chronic LBP, recent COVID infection with brain fog, glaucoma, obesity, bil knee OA, T2DM.    Examination-Activity Limitations Bed Mobility;Stand;Sleep;Locomotion Level;Carry;Bend    Examination-Participation Restrictions Church;Community Activity;Shop;Cleaning    Stability/Clinical Decision Making Evolving/Moderate complexity    Rehab Potential Good    PT Frequency 2x / week    PT Duration 6 weeks    PT Treatment/Interventions ADLs/Self Care Home Management;Cryotherapy;Electrical Stimulation;Iontophoresis 74m/ml Dexamethasone;Moist Heat;Traction;Ultrasound;Gait training;Stair training;Functional mobility training;Therapeutic activities;Therapeutic exercise;Balance training;Neuromuscular re-education;Patient/family education;Manual techniques;Passive range of motion;Dry needling;Taping;Joint Manipulations;Spinal Manipulations    PT Next Visit Plan DGI/Berg, progress HEP, manual to low back, modalities PRN    PT Home Exercise Plan Access Code: [email or web address]   Consulted and Agree with Plan of Care Patient             Patient will benefit from skilled therapeutic intervention in order to improve the following deficits and impairments:  Decreased activity tolerance, Decreased endurance, Decreased range of motion, Decreased strength, Increased fascial restricitons, Improper body mechanics, Pain, Postural dysfunction, Impaired flexibility, Increased muscle  spasms, Decreased balance, Decreased coordination, Decreased safety awareness  Visit Diagnosis: Chronic left-sided low back pain with left-sided sciatica  Other abnormalities of gait and mobility  Muscle weakness (generalized)  Cramp and spasm     Problem List Patient Active Problem List   Diagnosis Date Noted   Paroxysmal SVT (supraventricular tachycardia) (HCC) 01/22/2020   Arthritis 01/18/2020   Depression 01/18/2020   Loss of memory 01/18/2020   Spondylosis of cervical spine with myelopathy 01/18/2020   IFG (impaired fasting glucose) 10/24/2019   Sore neck 10/24/2019   Frequent urination 12/16/2018   Obstructive sleep apnea 12/16/2018   Type 2 diabetes mellitus without complication, without long-term current use of insulin ([place] 12/16/2018   Incomplete emptying of bladder 11/30/2017   Other insomnia 03/05/2017   Knee pain, left 12/22/2016   ESR raised 11/27/2016   Osteoarthritis of both knees 11/27/2016   Obesity (BMI 30-39.9) 11/10/2016   Abnormal EKG 10/23/2016   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/23/2016   Morbid obesity ([place] 10/23/2016   Palpitations 10/23/2016   Positive ANA (antinuclear antibody) 10/23/2016   Hypertension 10/08/2015   Chronic fatigue 10/08/2015   Diabetes mellitus ([place] 10/08/2015   Glaucoma 10/08/2015   Major depressive disorder in partial remission ([place] 10/08/2015   Vitamin D deficiency 10/08/2015   Sleep apnea 10/08/2015   History of pancreatitis 07/11/2014    ERennie Natter PT, DPT 06/06/2021, 12:13 PM  [facility] [address] [place] [address]Phone: [phone]  Fax:  [phone] Name: CLeeanne ButtersMRN: [id number] of Birth: [birth year]

## 2021-06-10 ENCOUNTER — Ambulatory Visit: Payer: Medicare HMO

## 2021-06-10 ENCOUNTER — Other Ambulatory Visit: Payer: Self-pay

## 2021-06-10 DIAGNOSIS — R2689 Other abnormalities of gait and mobility: Secondary | ICD-10-CM

## 2021-06-10 DIAGNOSIS — G8929 Other chronic pain: Secondary | ICD-10-CM

## 2021-06-10 DIAGNOSIS — M6281 Muscle weakness (generalized): Secondary | ICD-10-CM

## 2021-06-10 DIAGNOSIS — M5442 Lumbago with sciatica, left side: Secondary | ICD-10-CM | POA: Diagnosis not present

## 2021-06-10 DIAGNOSIS — R252 Cramp and spasm: Secondary | ICD-10-CM | POA: Diagnosis not present

## 2021-06-10 NOTE — Therapy (Signed)
[facility] [address]  [place], [place], [address] Phone: [phone]   Fax:  [phone]  Physical Therapy Treatment  Patient Details  Name: Brenda Mcfarland MRN: [id number] Date of Birth: Apr 04, 1946 Referring Provider (PT): Sherrie Mustache   Encounter Date: 06/10/2021   PT End of Session - 06/10/21 1201     Visit Number 3    Number of Visits 12    Date for PT Re-Evaluation 07/15/21    Authorization Type Medicare + State Health    Progress Note Due on Visit 10    PT Start Time 1103    PT Stop Time 1145    PT Time Calculation (min) 42 min    Activity Tolerance Patient tolerated treatment well    Behavior During Therapy [facility] for tasks assessed/performed             Past Medical History:  Diagnosis Date   Abnormal EKG 10/23/2016   Arthritis 01/18/2020   Chronic fatigue 10/08/2015   Depression 01/18/2020   Diabetes mellitus ([place]) 10/08/2015   Formatting of this note might be different from the original. Overview:  last Hgb A1C 7.2/no blood sugars at home Formatting of this note might be different from the original. last Hgb A1C 7.2/no blood sugars at home   ESR raised 11/27/2016   Frequent urination 12/16/2018   Glaucoma 10/08/2015   High cholesterol    History of pancreatitis 07/11/2014   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/23/2016   Hypertension 10/08/2015   IFG (impaired fasting glucose) 10/24/2019   Incomplete emptying of bladder 11/30/2017   Knee pain, left 12/22/2016   Loss of memory 01/18/2020   Major depressive disorder in partial remission ([place]) 10/08/2015   Morbid obesity ([place]) 10/23/2016   Obesity (BMI 30-39.9) 11/10/2016   Obstructive sleep apnea 12/16/2018   Osteoarthritis of both knees 11/27/2016   Other insomnia 03/05/2017   Palpitations 10/23/2016   Positive ANA (antinuclear antibody) 10/23/2016   Sleep apnea 10/08/2015   Formatting of this note might be different from the original. Overview:  does not use CPAP Formatting  of this note might be different from the original. does not use CPAP   Sore neck 10/24/2019   Spondylosis of cervical spine with myelopathy 01/18/2020   Type 2 diabetes mellitus without complication, without long-term current use of insulin ([place]) 12/16/2018   Vitamin D deficiency 10/08/2015    Past Surgical History:  Procedure Laterality Date   APPENDECTOMY     age 59   CESAREAN SECTION     X 3   NECK SURGERY  2015   SPINE SURGERY  05/29/2015    There were no vitals filed for this visit.   Subjective Assessment - 06/10/21 1114     Subjective Pt reports that she is feeling no pain today.    Pertinent History chronic low back pain, neck pain, previous neck surgery, recent COVID infections, brain fog, memory impairments, T2DM, OA bil knees, glaucoma    Diagnostic tests x-rays with chiropracter    Patient Stated Goals be coordinated, stand up straight, walk around the block, work on balance    Currently in Pain? No/denies                               [facility] Adult PT Treatment/Exercise - 06/10/21 0001       Exercises   Exercises Lumbar      Lumbar Exercises: Stretches  Single Knee to Chest Stretch Right;Left;2 reps;30 seconds    Single Knee to Chest Stretch Limitations supine    Lower Trunk Rotation Limitations 10x3"    Pelvic Tilt 10 reps    Figure 4 Stretch 30 seconds;Supine;With overpressure;2 reps    Figure 4 Stretch Limitations Bilat    Other Lumbar Stretch Exercise 3 way prayer stretch with pball 10x3"      Lumbar Exercises: Aerobic   Recumbent Bike L2x48mn      Lumbar Exercises: Seated   Other Seated Lumbar Exercises ab set with orange pball 10x3"                       PT Short Term Goals - 06/06/21 1211       PT SHORT TERM GOAL #1   Title Ind with initial HEP    Time 2    Period Weeks    Status On-going   06/06/21 - did not do due to pain, new exercises given today   Target Date 06/17/21      PT SHORT TERM GOAL #2   Title  Complete DGI/Berg to check for balance impairments.    Time 2    Period Weeks    Status On-going    Target Date 06/17/21               PT Long Term Goals - 06/03/21 1220       PT LONG TERM GOAL #1   Title Ind with progressed HEP to improve outcomes.    Time 6    Period Weeks    Status New    Target Date 07/15/21      PT LONG TERM GOAL #2   Title Improve walking tolerance to 1000' without limitation from back/leg pain    Baseline <50'    Time 6    Period Weeks    Status New    Target Date 07/15/21      PT LONG TERM GOAL #3   Title Demonstrate low fall risk on DGI/Berg with goal TBD    Time 6    Period Weeks    Status New    Target Date 07/15/21      PT LONG TERM GOAL #4   Title Pt. will report 75% improvement in low back pain.    Time 6    Period Weeks    Status New    Target Date 07/15/21                   Plan - 06/10/21 1202     Clinical Impression Statement Pt responded fairly to the treatment today. Noted more soft tissue restrictions with R hip ER during piriformis stretch and with trunk rotation. Focused mostly on stretches today to increase tissue extensibility of the low back musculature. Incorporated pelvic tilts today and much instruction required for proper motions with both directions. She was w/o any complaints post session.    Personal Factors and Comorbidities Comorbidity 3+;Other    Comorbidities history neck pain/surgery, chronic LBP, recent COVID infection with brain fog, glaucoma, obesity, bil knee OA, T2DM.    Examination-Activity Limitations Bed Mobility;Stand;Sleep;Locomotion Level;Carry;Bend    Examination-Participation Restrictions Church;Community Activity;Shop;Cleaning    Stability/Clinical Decision Making Evolving/Moderate complexity    Rehab Potential Good    PT Frequency 2x / week    PT Duration 6 weeks    PT Treatment/Interventions ADLs/Self Care Home Management;Cryotherapy;Electrical Stimulation;Iontophoresis 470mml  Dexamethasone;Moist Heat;Traction;Ultrasound;Gait training;Stair training;Functional mobility training;Therapeutic activities;Therapeutic exercise;Balance  training;Neuromuscular re-education;Patient/family education;Manual techniques;Passive range of motion;Dry needling;Taping;Joint Manipulations;Spinal Manipulations    PT Next Visit Plan DGI/Berg, progress HEP, manual to low back, modalities PRN    PT Home Exercise Plan Access Code: [email or web address]    Consulted and Agree with Plan of Care Patient             Patient will benefit from skilled therapeutic intervention in order to improve the following deficits and impairments:  Decreased activity tolerance, Decreased endurance, Decreased range of motion, Decreased strength, Increased fascial restricitons, Improper body mechanics, Pain, Postural dysfunction, Impaired flexibility, Increased muscle spasms, Decreased balance, Decreased coordination, Decreased safety awareness  Visit Diagnosis: Chronic left-sided low back pain with left-sided sciatica  Other abnormalities of gait and mobility  Muscle weakness (generalized)  Cramp and spasm     Problem List Patient Active Problem List   Diagnosis Date Noted   Paroxysmal SVT (supraventricular tachycardia) (HCC) 01/22/2020   Arthritis 01/18/2020   Depression 01/18/2020   Loss of memory 01/18/2020   Spondylosis of cervical spine with myelopathy 01/18/2020   IFG (impaired fasting glucose) 10/24/2019   Sore neck 10/24/2019   Frequent urination 12/16/2018   Obstructive sleep apnea 12/16/2018   Type 2 diabetes mellitus without complication, without long-term current use of insulin ([place]) 12/16/2018   Incomplete emptying of bladder 11/30/2017   Other insomnia 03/05/2017   Knee pain, left 12/22/2016   ESR raised 11/27/2016   Osteoarthritis of both knees 11/27/2016   Obesity (BMI 30-39.9) 11/10/2016   Abnormal EKG 10/23/2016   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/23/2016   Morbid obesity ([place]) 10/23/2016    Palpitations 10/23/2016   Positive ANA (antinuclear antibody) 10/23/2016   Hypertension 10/08/2015   Chronic fatigue 10/08/2015   Diabetes mellitus ([place]) 10/08/2015   Glaucoma 10/08/2015   Major depressive disorder in partial remission ([place]) 10/08/2015   Vitamin D deficiency 10/08/2015   Sleep apnea 10/08/2015   History of pancreatitis 07/11/2014    Artist Pais, PTA 06/10/2021, 12:09 PM  [facility] [address]  [place], [place], [address] Phone: [phone]   Fax:  [phone]  Name: Vestal Crandall MRN: [id number] Date of Birth: [birth year]

## 2021-06-12 ENCOUNTER — Telehealth (INDEPENDENT_AMBULATORY_CARE_PROVIDER_SITE_OTHER): Payer: Medicare HMO | Admitting: Nurse Practitioner

## 2021-06-12 ENCOUNTER — Other Ambulatory Visit: Payer: Self-pay

## 2021-06-12 DIAGNOSIS — J302 Other seasonal allergic rhinitis: Secondary | ICD-10-CM | POA: Diagnosis not present

## 2021-06-12 DIAGNOSIS — I10 Essential (primary) hypertension: Secondary | ICD-10-CM

## 2021-06-12 MED ORDER — LOSARTAN POTASSIUM 25 MG PO TABS: 25.0000 mg | ORAL_TABLET | Freq: Every day | ORAL | 0 refills | Status: DC

## 2021-06-12 NOTE — Patient Instructions (Signed)
Can use generic brand zyrtec 10 mg by mouth daily (do not use decongestant this will raise your blood pressure)   Start losartan 25 mg by mouth daily- take blood pressure 1 hour after

## 2021-06-12 NOTE — Progress Notes (Signed)
This service is provided via telemedicine  No vital signs collected/recorded due to the encounter was a telemedicine visit.   Location of patient (ex: home, work):  Home  Patient consents to a telephone visit:  Yes, see enounter dated 03/27/2021  Location of the provider (ex: office, home):  [employer]  Name of any referring provider:  N/A  Names of all persons participating in the telemedicine service and their role in the encounter:  Abbey Chatters, Nurse Practitioner, Elveria Royals, CMA, and patient.   Time spent on call:  9 minutes with medical assistant

## 2021-06-12 NOTE — Progress Notes (Signed)
Careteam: Patient Care Team: Lauree Chandler, NP as PCP - General (Geriatric Medicine) Berniece Salines, DO as PCP - Cardiology (Cardiology) Murlean Iba, MD as Referring Physician (Orthopedic Surgery) Park Liter, MD as Consulting Physician (Cardiology)  Advanced Directive information    Allergies  Allergen Reactions   Amlodipine Swelling   Aripiprazole Other (See Comments)    Tongue twisted, lips curled up Tongue twisted, lips curled up    Carvedilol Other (See Comments)    Nightmares Nightmares    Paroxetine Hcl Other (See Comments)    . Marland Kitchen    Tizanidine Other (See Comments)    Syncope Syncope    Tramadol Other (See Comments)    Unknown   Zolpidem Other (See Comments)    Sleepwalking Sleepwalking     Chief Complaint  Patient presents with   Acute Visit    Patient complains of elevated blood pressure and headaches. Patient states she doesn't think headaches are from blood pressure. Patient has had blood pressure readings of 178/86 (right now). Last night 18/075. Blood pressure like this for about a week. She has been trying natural things for blood pressure.Headaches not like the ones she's had in the past when BP high. Headaches more in the sinus area. Blood pressure retaken 162/82     HPI: Patient is a 75 y.o. female due to elevated blood pressure.  Reports over the last week. Sbp in the 170s. She had not been taking bp because she was doing well. Reports she has gained 5 lbs since she joined another church. Eating what other ppl have cooked. She has been eating with group on a regular basis.  HR in the 70s  Reports she has headaches due to elevated blood pressure but was a different type of headache.   Reports headaches now across forehead and has tenderness in her checks. Feels like more sinus related. Uses a heat pack and that will relieve the tension in head.   Reports she has taken lisinopril in the past and did not do well on  it.  Review of Systems:  Review of Systems  Constitutional:  Negative for chills and fever.  HENT:  Positive for congestion.   Eyes:        Itchy eyes  Respiratory:  Negative for shortness of breath.   Cardiovascular:  Negative for chest pain and palpitations.  Neurological:  Positive for headaches.  Endo/Heme/Allergies:  Positive for environmental allergies.   Past Medical History:  Diagnosis Date   Abnormal EKG 10/23/2016   Arthritis 01/18/2020   Chronic fatigue 10/08/2015   Depression 01/18/2020   Diabetes mellitus ([place]) 10/08/2015   Formatting of this note might be different from the original. Overview:  last Hgb A1C 7.2/no blood sugars at home Formatting of this note might be different from the original. last Hgb A1C 7.2/no blood sugars at home   ESR raised 11/27/2016   Frequent urination 12/16/2018   Glaucoma 10/08/2015   High cholesterol    History of pancreatitis 07/11/2014   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/23/2016   Hypertension 10/08/2015   IFG (impaired fasting glucose) 10/24/2019   Incomplete emptying of bladder 11/30/2017   Knee pain, left 12/22/2016   Loss of memory 01/18/2020   Major depressive disorder in partial remission ([place]) 10/08/2015   Morbid obesity ([place]) 10/23/2016   Obesity (BMI 30-39.9) 11/10/2016   Obstructive sleep apnea 12/16/2018   Osteoarthritis of both knees 11/27/2016   Other insomnia 03/05/2017   Palpitations 10/23/2016   Positive ANA (  antinuclear antibody) 10/23/2016   Sleep apnea 10/08/2015   Formatting of this note might be different from the original. Overview:  does not use CPAP Formatting of this note might be different from the original. does not use CPAP   Sore neck 10/24/2019   Spondylosis of cervical spine with myelopathy 01/18/2020   Type 2 diabetes mellitus without complication, without long-term current use of insulin ([place]) 12/16/2018   Vitamin D deficiency 10/08/2015   Past Surgical History:  Procedure Laterality Date   APPENDECTOMY     age 49   CESAREAN SECTION      X [place]  2015   SPINE SURGERY  05/29/2015   Social History:   reports that she has quit smoking. Her smoking use included cigarettes. She has a 10.00 pack-year smoking history. She has never used smokeless tobacco. She reports that she does not currently use alcohol. She reports that she does not currently use drugs.  Family History  Problem Relation Age of Onset   Diabetes Mother    Multiple sclerosis Mother    Heart disease Father    Heart Problems Father    Migraines Daughter    Thyroid disease Daughter    Migraines Daughter     Medications: Patient's Medications  New Prescriptions   No medications on file  Previous Medications   LATANOPROST (XALATAN) 0.005 % OPHTHALMIC SOLUTION    Place 1 drop into both eyes at bedtime.   MAGNESIUM GLYCINATE PO    Take 350 mg by mouth.   MELATONIN 5 MG TABS    Take by mouth at bedtime. 1-2 tablets   NYSTATIN CREAM (MYCOSTATIN)    Apply 1 application topically 2 (two) times daily.   OVER THE COUNTER MEDICATION    daily. Essential Oils   TRAZODONE (DESYREL) 150 MG TABLET    Take one tablet by mouth once daily at bedtime.  Modified Medications   No medications on file  Discontinued Medications   MIRABEGRON ER (MYRBETRIQ) 50 MG TB24 TABLET    Take 1 tablet (50 mg total) by mouth daily.    Physical Exam:  There were no vitals filed for this visit. There is no height or weight on file to calculate BMI. Wt Readings from Last 3 Encounters:  05/02/21 234 lb 9.6 oz (106.4 kg)  03/12/21 232 lb 9.6 oz (105.5 kg)  11/18/20 227 lb 9.6 oz (103.2 kg)      Labs reviewed: Basic Metabolic Panel: Recent Labs    03/12/21 1601  NA 139  K 3.9  CL 103  CO2 26  GLUCOSE 126  BUN 19  CREATININE 0.74  CALCIUM 9.1   Liver Function Tests: Recent Labs    03/12/21 1601  AST 15  ALT 17  BILITOT 0.3  PROT 6.7   No results for input(s): LIPASE, AMYLASE in the last 8760 hours. No results for input(s): AMMONIA in the last 8760  hours. CBC: Recent Labs    03/12/21 1601  WBC 6.0  NEUTROABS 3,300  HGB 13.4  HCT 40.4  MCV 94.4  PLT 244   Lipid Panel: No results for input(s): CHOL, HDL, LDLCALC, TRIG, CHOLHDL, LDLDIRECT in the last 8760 hours. TSH: No results for input(s): TSH in the last 8760 hours. A1C: Lab Results  Component Value Date   HGBA1C 5.9 (H) 03/12/2021     Assessment/Plan 1. Primary hypertension Not at goal. Encouraged dietary modifications. Will also start losartan 25 mg by mouth daily. To continue to monitor bp  at home goal <140/90 - losartan (COZAAR) 25 MG tablet; Take 1 tablet (25 mg total) by mouth daily.  Dispense: 90 tablet; Refill: 0  2. Seasonal allergies -to add zyrtec 10 mg by mouth daily, avoid decongestants as they will increase bp   Next appt: 07/04/2021 Carlos American. Harle Battiest  [facility] [phone]    Virtual Visit via mychart video  I connected with patient on 06/12/21 at 10:00 AM EDT by video and verified that I am speaking with the correct person using two identifiers.  Location: Patient: home Provider: [facility]   I discussed the limitations, risks, security and privacy concerns of performing an evaluation and management service by telephone and the availability of in person appointments. I also discussed with the patient that there may be a patient responsible charge related to this service. The patient expressed understanding and agreed to proceed.   I discussed the assessment and treatment plan with the patient. The patient was provided an opportunity to ask questions and all were answered. The patient agreed with the plan and demonstrated an understanding of the instructions.   The patient was advised to call back or seek an in-person evaluation if the symptoms worsen or if the condition fails to improve as anticipated.  I provided 17 minutes of non-face-to-face time during this encounter.  Carlos American. Harle Battiest Avs  printed and mailed

## 2021-06-13 ENCOUNTER — Encounter: Payer: Self-pay | Admitting: Physical Therapy

## 2021-06-13 ENCOUNTER — Telehealth: Payer: Self-pay | Admitting: *Deleted

## 2021-06-13 NOTE — Telephone Encounter (Signed)
Patient called and wanted to know if she can take Flonase for her allergies instead of Zyrtec. Wants to know if she can use Zyrtec everyday or when she has symptoms. States it is expensive $30/box.    Wants to know which one is more effective for acute care.  Symptoms are Having Headaches and nose congestion.   Please Advise.

## 2021-06-13 NOTE — Telephone Encounter (Signed)
Patient notified and agreed.  

## 2021-06-13 NOTE — Telephone Encounter (Signed)
She can use the generic zyrtec  But she can also use flonase as well.

## 2021-06-17 ENCOUNTER — Other Ambulatory Visit: Payer: Self-pay

## 2021-06-17 ENCOUNTER — Ambulatory Visit: Payer: Medicare HMO | Admitting: Physical Therapy

## 2021-06-17 DIAGNOSIS — M5442 Lumbago with sciatica, left side: Secondary | ICD-10-CM | POA: Diagnosis not present

## 2021-06-17 DIAGNOSIS — G8929 Other chronic pain: Secondary | ICD-10-CM

## 2021-06-17 DIAGNOSIS — R2689 Other abnormalities of gait and mobility: Secondary | ICD-10-CM | POA: Diagnosis not present

## 2021-06-17 DIAGNOSIS — R252 Cramp and spasm: Secondary | ICD-10-CM

## 2021-06-17 DIAGNOSIS — M6281 Muscle weakness (generalized): Secondary | ICD-10-CM | POA: Diagnosis not present

## 2021-06-17 NOTE — Therapy (Signed)
[facility] [address]  [place], [place], [address] Phone: [phone]   Fax:  [phone]  Physical Therapy Treatment  Patient Details  Name: Brenda Mcfarland MRN: [id number] Date of Birth: 05-28-1946 Referring Provider (PT): Sherrie Mustache   Encounter Date: 06/17/2021   PT End of Session - 06/17/21 1153     Visit Number 4    Number of Visits 12    Date for PT Re-Evaluation 07/15/21    Authorization Type Medicare + State Health    Progress Note Due on Visit 10    PT Start Time 1105    PT Stop Time 1147    PT Time Calculation (min) 42 min    Activity Tolerance Patient tolerated treatment well    Behavior During Therapy [facility] for tasks assessed/performed             Past Medical History:  Diagnosis Date   Abnormal EKG 10/23/2016   Arthritis 01/18/2020   Chronic fatigue 10/08/2015   Depression 01/18/2020   Diabetes mellitus ([place]) 10/08/2015   Formatting of this note might be different from the original. Overview:  last Hgb A1C 7.2/no blood sugars at home Formatting of this note might be different from the original. last Hgb A1C 7.2/no blood sugars at home   ESR raised 11/27/2016   Frequent urination 12/16/2018   Glaucoma 10/08/2015   High cholesterol    History of pancreatitis 07/11/2014   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/23/2016   Hypertension 10/08/2015   IFG (impaired fasting glucose) 10/24/2019   Incomplete emptying of bladder 11/30/2017   Knee pain, left 12/22/2016   Loss of memory 01/18/2020   Major depressive disorder in partial remission ([place]) 10/08/2015   Morbid obesity ([place]) 10/23/2016   Obesity (BMI 30-39.9) 11/10/2016   Obstructive sleep apnea 12/16/2018   Osteoarthritis of both knees 11/27/2016   Other insomnia 03/05/2017   Palpitations 10/23/2016   Positive ANA (antinuclear antibody) 10/23/2016   Sleep apnea 10/08/2015   Formatting of this note might be different from the original. Overview:  does not use CPAP Formatting  of this note might be different from the original. does not use CPAP   Sore neck 10/24/2019   Spondylosis of cervical spine with myelopathy 01/18/2020   Type 2 diabetes mellitus without complication, without long-term current use of insulin ([place]) 12/16/2018   Vitamin D deficiency 10/08/2015    Past Surgical History:  Procedure Laterality Date   APPENDECTOMY     age 75   CESAREAN SECTION     X 3   NECK SURGERY  2015   SPINE SURGERY  05/29/2015    There were no vitals filed for this visit.   Subjective Assessment - 06/17/21 1103     Subjective Patient reports no pain today coming in.  She went to the beach this weekend and reports not eating well, so feeling weaker than usual.  No falls reported.    Pertinent History chronic low back pain, neck pain, previous neck surgery, recent COVID infections, brain fog, memory impairments, T2DM, OA bil knees, glaucoma    Diagnostic tests x-rays with chiropracter    Patient Stated Goals be coordinated, stand up straight, walk around the block, work on balance    Currently in Pain? No/denies                               [facility] Adult PT Treatment/Exercise - 06/17/21 0001  Balance   Balance Assessed Yes      Standardized Balance Assessment   Standardized Balance Assessment Dynamic Gait Index      Dynamic Gait Index   Level Surface Mild Impairment    Change in Gait Speed Mild Impairment    Gait with Horizontal Head Turns Moderate Impairment    Gait with Vertical Head Turns Moderate Impairment    Gait and Pivot Turn Normal    Step Over Obstacle Normal    Step Around Obstacles Normal    Steps Mild Impairment    Total Score 17      Lumbar Exercises: Aerobic   Tread Mill x 5 min 0.8 mph, VC for heel strike   reported feeling of dizziness afterwards     Lumbar Exercises: Seated   Other Seated Lumbar Exercises foward flexion with green therapy ball to relax back after standing exercises.                  Balance Exercises - 06/17/21 0001       Balance Exercises: Standing   Standing Eyes Opened Head turns;Foam/compliant surface;Limitations    Standing Eyes Opened Limitations In corner for safety, EO x 30 sec, head nods x 10, head turns x 10  repeated on airex.  minimal sway noted.    Standing Eyes Closed Head turns;Foam/compliant surface;Limitations    Standing Eyes Closed Limitations In corner for safety, EO x 30 sec, head nods x 10, head turns x 10.  Repeated on airex.  Needed tactile cues to decrease sway x 2    Other Standing Exercises church pews x 20, retro step x 10 bil                  PT Short Term Goals - 06/17/21 1157       PT SHORT TERM GOAL #1   Title Ind with initial HEP    Time 2    Period Weeks    Status On-going   06/06/21 - did not do due to pain, new exercises given today   Target Date 06/17/21      PT SHORT TERM GOAL #2   Title Complete DGI/Berg to check for balance impairments.    Time 2    Period Weeks    Status Achieved   DGI 17/24   Target Date 06/17/21               PT Long Term Goals - 06/17/21 1157       PT LONG TERM GOAL #1   Title Ind with progressed HEP to improve outcomes.    Time 6    Period Weeks    Status On-going   06/17/21- progressed   Target Date 07/15/21      PT LONG TERM GOAL #2   Title Improve walking tolerance to 1000' without limitation from back/leg pain    Baseline <50'    Time 6    Period Weeks    Status On-going   06/17/21- completed 5 min on treadmill   Target Date 07/15/21      PT LONG TERM GOAL #3   Title Patient will improve score on DGI to >19/24 to decrease risk of falls.    Baseline 17/24 - high risk for falls.    Time 6    Period Weeks    Status On-going    Target Date 07/15/21      PT LONG TERM GOAL #4   Title Pt. will report 75% improvement in  low back pain.    Time 6    Period Weeks    Status On-going   06/17/21- no report of pain today                  Plan - 06/17/21 1154      Clinical Impression Statement Patient reports feeling tired and weak today, but otherwise no pain reported.  She participated in balance testing today, scoring 17/24 on DGI indicating high risk of falls in community dwelling adults.  She also reported some dizziness after walking on treadmill, and had difficulty with heel/toe gait while on treadmill, needing a lot of cuing.  Otherwise able to participate in exercises with minimal rest breaks.  Progressed HEP to incorporate weight shifting and head movements for balance. She would benefit from continued skilled therapy.    Personal Factors and Comorbidities Comorbidity 3+;Other    Comorbidities history neck pain/surgery, chronic LBP, recent COVID infection with brain fog, glaucoma, obesity, bil knee OA, T2DM.    Examination-Activity Limitations Bed Mobility;Stand;Sleep;Locomotion Level;Carry;Bend    Examination-Participation Restrictions Church;Community Activity;Shop;Cleaning    Stability/Clinical Decision Making Evolving/Moderate complexity    Rehab Potential Good    PT Frequency 2x / week    PT Duration 6 weeks    PT Treatment/Interventions ADLs/Self Care Home Management;Cryotherapy;Electrical Stimulation;Iontophoresis 55m/ml Dexamethasone;Moist Heat;Traction;Ultrasound;Gait training;Stair training;Functional mobility training;Therapeutic activities;Therapeutic exercise;Balance training;Neuromuscular re-education;Patient/family education;Manual techniques;Passive range of motion;Dry needling;Taping;Joint Manipulations;Spinal Manipulations    PT Next Visit Plan LE strengthening for core/LE/balance, manual to low back, modalities PRN    PT Home Exercise Plan Access Code: [email or web address]    [id number] Agree with Plan of Care Patient             Patient will benefit from skilled therapeutic intervention in order to improve the following deficits and impairments:  Decreased activity tolerance, Decreased endurance, Decreased range of motion,  Decreased strength, Increased fascial restricitons, Improper body mechanics, Pain, Postural dysfunction, Impaired flexibility, Increased muscle spasms, Decreased balance, Decreased coordination, Decreased safety awareness  Visit Diagnosis: Chronic left-sided low back pain with left-sided sciatica  Other abnormalities of gait and mobility  Muscle weakness (generalized)  Cramp and spasm     Problem List Patient Active Problem List   Diagnosis Date Noted   Paroxysmal SVT (supraventricular tachycardia) ([place] 01/22/2020   Arthritis 01/18/2020   Depression 01/18/2020   Loss of memory 01/18/2020   Spondylosis of cervical spine with myelopathy 01/18/2020   IFG (impaired fasting glucose) 10/24/2019   Sore neck 10/24/2019   Frequent urination 12/16/2018   Obstructive sleep apnea 12/16/2018   Type 2 diabetes mellitus without complication, without long-term current use of insulin ([place] 12/16/2018   Incomplete emptying of bladder 11/30/2017   Other insomnia 03/05/2017   Knee pain, left 12/22/2016   ESR raised 11/27/2016   Osteoarthritis of both knees 11/27/2016   Obesity (BMI 30-39.9) 11/10/2016   Abnormal EKG 10/23/2016   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/23/2016   Morbid obesity ([place] 10/23/2016   Palpitations 10/23/2016   Positive ANA (antinuclear antibody) 10/23/2016   Hypertension 10/08/2015   Chronic fatigue 10/08/2015   Diabetes mellitus ([place] 10/08/2015   Glaucoma 10/08/2015   Major depressive disorder in partial remission ([place] 10/08/2015   Vitamin D deficiency 10/08/2015   Sleep apnea 10/08/2015   History of pancreatitis 07/11/2014    ERennie Natter PT, DPT 06/17/2021, 12:04 PM  [facility] [address] [place] [address]Phone: [phone]  Fax:  [phone]  Name: Sivan Cuello MRN: [id number] Date of Birth: [birth year]

## 2021-06-18 ENCOUNTER — Telehealth: Payer: Self-pay

## 2021-06-18 NOTE — Telephone Encounter (Signed)
Patient called ans stated that she is taking losartan and it seems to help a little bit. She states that she has been taking two tablets daily one in the morning and one in the evening. The lowest her bp has been with taking two tablets is 160/70. She would like to know if she needs a stronger dose or what you would like her to do.  Message routed to Abbey Chatters, NP

## 2021-06-19 NOTE — Telephone Encounter (Signed)
Lets have her start taking the 2 tablets together and taking blood pressure 1 hour afterwards.  She will need in office follow up if blood pressure does not improve to <140/90.

## 2021-06-19 NOTE — Telephone Encounter (Addendum)
I spoke with patient and informed her to take the tablets together and to check bp one hour after taking medication. Patient stated that she contacted her primary care provider [facility], and asked her to call in 50 mg of the losartan for her, because she was afraid we we not going to get back in touch with her soon. She would also like ti have zyrtec called in because she just found out that it is covered by her insurance. She said that she needs just basic zyrtec for allergies. She states that the she really doesn't go to [facility] because she found [place].

## 2021-06-19 NOTE — Telephone Encounter (Signed)
Noted, please let patient know that she should only be seeing 1 primary care provider otherwise insurance may not reimburse. Also this can cause complications with medications, labs, etcs.  We will be happy to continue seeing her but we are considered primary care as well.

## 2021-06-20 ENCOUNTER — Ambulatory Visit: Payer: Medicare HMO

## 2021-06-20 ENCOUNTER — Other Ambulatory Visit: Payer: Self-pay

## 2021-06-20 DIAGNOSIS — G8929 Other chronic pain: Secondary | ICD-10-CM

## 2021-06-20 DIAGNOSIS — R252 Cramp and spasm: Secondary | ICD-10-CM

## 2021-06-20 DIAGNOSIS — R2689 Other abnormalities of gait and mobility: Secondary | ICD-10-CM | POA: Diagnosis not present

## 2021-06-20 DIAGNOSIS — M6281 Muscle weakness (generalized): Secondary | ICD-10-CM

## 2021-06-20 DIAGNOSIS — M5442 Lumbago with sciatica, left side: Secondary | ICD-10-CM | POA: Diagnosis not present

## 2021-06-20 NOTE — Therapy (Signed)
[facility] [place] [address]  [place], [place], [address] Phone: [phone]   Fax:  [phone]  Physical Therapy Treatment  Patient Details  Name: Brenda Mcfarland MRN: [id number] Date of Birth: 1946-02-08 Referring Provider (PT): Sherrie Mustache   Encounter Date: 06/20/2021   PT End of Session - 06/20/21 1205     Visit Number 5    Number of Visits 12    Date for PT Re-Evaluation 07/15/21    Authorization Type Medicare + State Health    Progress Note Due on Visit 10    PT Start Time 1103    PT Stop Time 1144    PT Time Calculation (min) 41 min    Activity Tolerance Patient tolerated treatment well    Behavior During Therapy [facility] for tasks assessed/performed             Past Medical History:  Diagnosis Date   Abnormal EKG 10/23/2016   Arthritis 01/18/2020   Chronic fatigue 10/08/2015   Depression 01/18/2020   Diabetes mellitus ([place]) 10/08/2015   Formatting of this note might be different from the original. Overview:  last Hgb A1C 7.2/no blood sugars at home Formatting of this note might be different from the original. last Hgb A1C 7.2/no blood sugars at home   ESR raised 11/27/2016   Frequent urination 12/16/2018   Glaucoma 10/08/2015   High cholesterol    History of pancreatitis 07/11/2014   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/23/2016   Hypertension 10/08/2015   IFG (impaired fasting glucose) 10/24/2019   Incomplete emptying of bladder 11/30/2017   Knee pain, left 12/22/2016   Loss of memory 01/18/2020   Major depressive disorder in partial remission ([place]) 10/08/2015   Morbid obesity ([place]) 10/23/2016   Obesity (BMI 30-39.9) 11/10/2016   Obstructive sleep apnea 12/16/2018   Osteoarthritis of both knees 11/27/2016   Other insomnia 03/05/2017   Palpitations 10/23/2016   Positive ANA (antinuclear antibody) 10/23/2016   Sleep apnea 10/08/2015   Formatting of this note might be different from the original. Overview:  does not use CPAP Formatting  of this note might be different from the original. does not use CPAP   Sore neck 10/24/2019   Spondylosis of cervical spine with myelopathy 01/18/2020   Type 2 diabetes mellitus without complication, without long-term current use of insulin ([place]) 12/16/2018   Vitamin D deficiency 10/08/2015    Past Surgical History:  Procedure Laterality Date   APPENDECTOMY     age 17   CESAREAN SECTION     X 3   NECK SURGERY  2015   SPINE SURGERY  05/29/2015    There were no vitals filed for this visit.   Subjective Assessment - 06/20/21 1110     Subjective Pt reports that she is doing good and making progress.    Pertinent History chronic low back pain, neck pain, previous neck surgery, recent COVID infections, brain fog, memory impairments, T2DM, OA bil knees, glaucoma    Diagnostic tests x-rays with chiropracter    Patient Stated Goals be coordinated, stand up straight, walk around the block, work on balance    Currently in Pain? No/denies                               [facility] Adult PT Treatment/Exercise - 06/20/21 0001       Exercises   Exercises Lumbar;Knee/Hip      Lumbar Exercises: Stretches  Active Hamstring Stretch Right;Left;2 reps;30 seconds    Active Hamstring Stretch Limitations seated    Piriformis Stretch Right;Left;2 reps;30 seconds    Piriformis Stretch Limitations KTOS seated    Figure 4 Stretch 30 seconds;Seated;With overpressure    Figure 4 Stretch Limitations B    Other Lumbar Stretch Exercise upper trunk rotation 10x2"      Lumbar Exercises: Aerobic   Recumbent Bike L2x65mn    Nustep L4x415m      Lumbar Exercises: Seated   Other Seated Lumbar Exercises shoulder squeezes 10x3"                 Balance Exercises - 06/20/21 0001       Balance Exercises: Standing   Standing Eyes Closed Narrow base of support (BOS);Solid surface;3 reps;10 secs    SLS Eyes open;Solid surface;2 reps;10 secs    Marching Solid surface;Static;Upper extremity  assist 1;10 reps                PT Education - 06/20/21 1217     Education Details HEP update: Access Code: [email or web address] on ways to improve HEP compliance throughout the day    Person(s) Educated Patient    Methods Explanation;Demonstration;Handout    Comprehension Verbalized understanding;Returned demonstration              PT Short Term Goals - 06/17/21 1157       PT SHORT TERM GOAL #1   Title Ind with initial HEP    Time 2    Period Weeks    Status On-going   06/06/21 - did not do due to pain, new exercises given today   Target Date 06/17/21      PT SHORT TERM GOAL #2   Title Complete DGI/Berg to check for balance impairments.    Time 2    Period Weeks    Status Achieved   DGI 17/24   Target Date 06/17/21               PT Long Term Goals - 06/17/21 1157       PT LONG TERM GOAL #1   Title Ind with progressed HEP to improve outcomes.    Time 6    Period Weeks    Status On-going   06/17/21- progressed   Target Date 07/15/21      PT LONG TERM GOAL #2   Title Improve walking tolerance to 1000' without limitation from back/leg pain    Baseline <50'    Time 6    Period Weeks    Status On-going   06/17/21- completed 5 min on treadmill   Target Date 07/15/21      PT LONG TERM GOAL #3   Title Patient will improve score on DGI to >19/24 to decrease risk of falls.    Baseline 17/24 - high risk for falls.    Time 6    Period Weeks    Status On-going    Target Date 07/15/21      PT LONG TERM GOAL #4   Title Pt. will report 75% improvement in low back pain.    Time 6    Period Weeks    Status On-going   06/17/21- no report of pain today                  Plan - 06/20/21 1218     Clinical Impression Statement Reviewed ways to improve HEP compliance with patient today to maximize results. Added seated stretches and standing  marches to HEP for her convinience during ADLs. Required modification from figure stretch to KTOS piriformis stretch  for comfort. Tightness noted in both HS but more L side tightness. She showed slight instability with the static balance exercises but good upright posture. Pt w/o any complaints post session, good response.    Personal Factors and Comorbidities Comorbidity 3+;Other    Comorbidities history neck pain/surgery, chronic LBP, recent COVID infection with brain fog, glaucoma, obesity, bil knee OA, T2DM.    PT Frequency 2x / week    PT Duration 6 weeks    PT Treatment/Interventions ADLs/Self Care Home Management;Cryotherapy;Electrical Stimulation;Iontophoresis 34m/ml Dexamethasone;Moist Heat;Traction;Ultrasound;Gait training;Stair training;Functional mobility training;Therapeutic activities;Therapeutic exercise;Balance training;Neuromuscular re-education;Patient/family education;Manual techniques;Passive range of motion;Dry needling;Taping;Joint Manipulations;Spinal Manipulations    PT Next Visit Plan LE strengthening for core/LE/balance, manual to low back, modalities PRN    PT Home Exercise Plan Access Code: [email or web address] Access Code: [email or web address](10/21)    Consulted and Agree with Plan of Care Patient             Patient will benefit from skilled therapeutic intervention in order to improve the following deficits and impairments:  Decreased activity tolerance, Decreased endurance, Decreased range of motion, Decreased strength, Increased fascial restricitons, Improper body mechanics, Pain, Postural dysfunction, Impaired flexibility, Increased muscle spasms, Decreased balance, Decreased coordination, Decreased safety awareness  Visit Diagnosis: Chronic left-sided low back pain with left-sided sciatica  Other abnormalities of gait and mobility  Muscle weakness (generalized)  Cramp and spasm     Problem List Patient Active Problem List   Diagnosis Date Noted   Paroxysmal SVT (supraventricular tachycardia) ([place] 01/22/2020   Arthritis 01/18/2020   Depression 01/18/2020   Loss of memory 01/18/2020    Spondylosis of cervical spine with myelopathy 01/18/2020   IFG (impaired fasting glucose) 10/24/2019   Sore neck 10/24/2019   Frequent urination 12/16/2018   Obstructive sleep apnea 12/16/2018   Type 2 diabetes mellitus without complication, without long-term current use of insulin ([place] 12/16/2018   Incomplete emptying of bladder 11/30/2017   Other insomnia 03/05/2017   Knee pain, left 12/22/2016   ESR raised 11/27/2016   Osteoarthritis of both knees 11/27/2016   Obesity (BMI 30-39.9) 11/10/2016   Abnormal EKG 10/23/2016   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/23/2016   Morbid obesity ([place] 10/23/2016   Palpitations 10/23/2016   Positive ANA (antinuclear antibody) 10/23/2016   Hypertension 10/08/2015   Chronic fatigue 10/08/2015   Diabetes mellitus ([place] 10/08/2015   Glaucoma 10/08/2015   Major depressive disorder in partial remission ([place] 10/08/2015   Vitamin D deficiency 10/08/2015   Sleep apnea 10/08/2015   History of pancreatitis 07/11/2014    BArtist Pais PTA 06/20/2021, 12:24 PM  [facility] [address] [place] [address]Phone: [phone]  Fax:  [phone] Name: Brenda GordenMRN: [id number] of Birth: 5Jul 04, 1947

## 2021-06-24 ENCOUNTER — Ambulatory Visit: Payer: Medicare HMO | Admitting: Physical Therapy

## 2021-06-24 ENCOUNTER — Encounter: Payer: Self-pay | Admitting: Physical Therapy

## 2021-06-24 ENCOUNTER — Other Ambulatory Visit: Payer: Self-pay

## 2021-06-24 DIAGNOSIS — M5442 Lumbago with sciatica, left side: Secondary | ICD-10-CM | POA: Diagnosis not present

## 2021-06-24 DIAGNOSIS — M6281 Muscle weakness (generalized): Secondary | ICD-10-CM

## 2021-06-24 DIAGNOSIS — R252 Cramp and spasm: Secondary | ICD-10-CM

## 2021-06-24 DIAGNOSIS — G8929 Other chronic pain: Secondary | ICD-10-CM | POA: Diagnosis not present

## 2021-06-24 DIAGNOSIS — R2689 Other abnormalities of gait and mobility: Secondary | ICD-10-CM | POA: Diagnosis not present

## 2021-06-24 NOTE — Patient Instructions (Signed)
Access Code: [email or web address] URL: [email or web address] Date: 06/24/2021 Prepared by: Harrie Foreman  Exercises Heel Raises with Counter Support - 1 x daily - 7 x weekly - 2 sets - 10 reps Mini Squat with Counter Support - 1 x daily - 7 x weekly - 2 sets - 10 reps Standing Hip Abduction with Counter Support - 1 x daily - 7 x weekly - 2 sets - 10 reps Standing Hip Extension with Counter Support - 1 x daily - 7 x weekly - 2 sets - 10 reps

## 2021-06-24 NOTE — Therapy (Signed)
[facility] [address]  [address] [place], [place], [address] Phone: [phone]   Fax:  [phone]  Physical Therapy Treatment  Patient Details  Name: Brenda Mcfarland MRN: [id number] Date of Birth: 09/02/1945 Referring Provider (PT): Eubanks, Jessica   Encounter Date: 06/24/2021   PT End of Session - 06/24/21 1107     Visit Number 6    Number of Visits 12    Date for PT Re-Evaluation 07/15/21    Authorization Type Medicare + State Health    Progress Note Due on Visit 10    PT Start Time 1102    PT Stop Time 1145    PT Time Calculation (min) 43 min    Activity Tolerance Patient tolerated treatment well    Behavior During Therapy WFL for tasks assessed/performed             Past Medical History:  Diagnosis Date   Abnormal EKG 10/23/2016   Arthritis 01/18/2020   Chronic fatigue 10/08/2015   Depression 01/18/2020   Diabetes mellitus ([facility]) 10/08/2015   Formatting of this note might be different from the original. Overview:  last Hgb A1C 7.2/no blood sugars at home Formatting of this note might be different from the original. last Hgb A1C 7.2/no blood sugars at home   ESR raised 11/27/2016   Frequent urination 12/16/2018   Glaucoma 10/08/2015   High cholesterol    History of pancreatitis 07/11/2014   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/23/2016   Hypertension 10/08/2015   IFG (impaired fasting glucose) 10/24/2019   Incomplete emptying of bladder 11/30/2017   Knee pain, left 12/22/2016   Loss of memory 01/18/2020   Major depressive disorder in partial remission ([facility]) 10/08/2015   Morbid obesity ([facility]) 10/23/2016   Obesity (BMI 30-39.9) 11/10/2016   Obstructive sleep apnea 12/16/2018   Osteoarthritis of both knees 11/27/2016   Other insomnia 03/05/2017   Palpitations 10/23/2016   Positive ANA (antinuclear antibody) 10/23/2016   Sleep apnea 10/08/2015   Formatting of this note might be different from the original. Overview:  does not use CPAP Formatting  of this note might be different from the original. does not use CPAP   Sore neck 10/24/2019   Spondylosis of cervical spine with myelopathy 01/18/2020   Type 2 diabetes mellitus without complication, without long-term current use of insulin ([facility]) 12/16/2018   Vitamin D deficiency 10/08/2015    Past Surgical History:  Procedure Laterality Date   APPENDECTOMY     age 9   CESAREAN SECTION     X 3   NECK SURGERY  2015   SPINE SURGERY  05/29/2015    There were no vitals filed for this visit.   Subjective Assessment - 06/24/21 1105     Subjective Patient reports starting new BP medication.  Reported some soreness after last session, but otherwise doing well.  Admits not being consistent with HEP, will try to do better.    Pertinent History chronic low back pain, neck pain, previous neck surgery, recent COVID infections, brain fog, memory impairments, T2DM, OA bil knees, glaucoma    Diagnostic tests x-rays with chiropracter    Patient Stated Goals be coordinated, stand up straight, walk around the block, work on balance    Currently in Pain? No/denies                               [facility] Adult PT Treatment/Exercise - 06/24/21 0001         Exercises   Exercises Lumbar;Knee/Hip      Lumbar Exercises: Stretches   Lower Trunk Rotation 5 reps    Lower Trunk Rotation Limitations bil    Piriformis Stretch Right;1 rep;30 seconds    Piriformis Stretch Limitations supine KTOS      Lumbar Exercises: Aerobic   Nustep L5 x 6 min      Lumbar Exercises: Standing   Heel Raises 10 reps    Heel Raises Limitations at counter for support    Functional Squats 10 reps    Functional Squats Limitations at counter for support, VC and demo      Lumbar Exercises: Seated   Other Seated Lumbar Exercises foward flexion with ball x 10, also side to side for stretch      Lumbar Exercises: Supine   Bridge 10 reps    Bridge Limitations with TrA contraction    Straight Leg Raise 10 reps     Straight Leg Raises Limitations bil with TrA contraction      Knee/Hip Exercises: Standing   Hip Abduction Stengthening;Both;2 sets;5 reps    Abduction Limitations at counter for support    Hip Extension Stengthening;Both;10 reps    Extension Limitations at counter for support                     PT Education - 06/24/21 1127     Education Details HEP update Access Code: [email or web address]    Person(s) Educated Patient    Methods Explanation;Demonstration;Verbal cues;Handout    Comprehension Verbalized understanding;Returned demonstration              PT Short Term Goals - 06/17/21 1157       PT SHORT TERM GOAL #1   Title Ind with initial HEP    Time 2    Period Weeks    Status On-going   06/06/21 - did not do due to pain, new exercises given today   Target Date 06/17/21      PT SHORT TERM GOAL #2   Title Complete DGI/Berg to check for balance impairments.    Time 2    Period Weeks    Status Achieved   DGI 17/24   Target Date 06/17/21               PT Long Term Goals - 06/24/21 1150       PT LONG TERM GOAL #1   Title Ind with progressed HEP to improve outcomes.    Time 6    Period Weeks    Status On-going   06/17/21- progressed 10/25- progressed adding standing counter exercises.     PT LONG TERM GOAL #2   Title Improve walking tolerance to 1000' without limitation from back/leg pain    Baseline <50'    Time 6    Period Weeks    Status On-going   06/17/21- completed 5 min on treadmill     PT LONG TERM GOAL #3   Title Patient will improve score on DGI to >19/24 to decrease risk of falls.    Baseline 17/24 - high risk for falls.    Time 6    Period Weeks    Status On-going      PT LONG TERM GOAL #4   Title Pt. will report 75% improvement in low back pain.    Time 6    Period Weeks    Status On-going   06/17/21- no report of pain today  06/24/21- no pain today.                    Plan - 06/24/21 1147     Clinical Impression  Statement Focus of today's skilled intervention was continuing to progress HEP for core/LE strengthening, and education throughout on how to incorporate exercises into routine, spread throughout the day to both improve compliance and tolerance.  She was able to do counter exercises today reporting some discomfort through the low back with hip extension, but easily calmed with foward flexion stretch afterwards.  She declined offer of MHP at end of session, reporting no pain.  She would benefit from continued skilled therapy.    Personal Factors and Comorbidities Comorbidity 3+;Other    Comorbidities history neck pain/surgery, chronic LBP, recent COVID infection with brain fog, glaucoma, obesity, bil knee OA, T2DM.    PT Frequency 2x / week    PT Duration 6 weeks    PT Treatment/Interventions ADLs/Self Care Home Management;Cryotherapy;Electrical Stimulation;Iontophoresis 66m/ml Dexamethasone;Moist Heat;Traction;Ultrasound;Gait training;Stair training;Functional mobility training;Therapeutic activities;Therapeutic exercise;Balance training;Neuromuscular re-education;Patient/family education;Manual techniques;Passive range of motion;Dry needling;Taping;Joint Manipulations;Spinal Manipulations    PT Next Visit Plan LE strengthening for core/LE/balance, manual to low back, modalities PRN    PT Home Exercise Plan Access Code: [email or web address] Access Code: [email or web address](10/21)    Consulted and Agree with Plan of Care Patient             Patient will benefit from skilled therapeutic intervention in order to improve the following deficits and impairments:  Decreased activity tolerance, Decreased endurance, Decreased range of motion, Decreased strength, Increased fascial restricitons, Improper body mechanics, Pain, Postural dysfunction, Impaired flexibility, Increased muscle spasms, Decreased balance, Decreased coordination, Decreased safety awareness  Visit Diagnosis: Chronic left-sided low back pain with left-sided  sciatica  Other abnormalities of gait and mobility  Muscle weakness (generalized)  Cramp and spasm     Problem List Patient Active Problem List   Diagnosis Date Noted   Paroxysmal SVT (supraventricular tachycardia) ([place] 01/22/2020   Arthritis 01/18/2020   Depression 01/18/2020   Loss of memory 01/18/2020   Spondylosis of cervical spine with myelopathy 01/18/2020   IFG (impaired fasting glucose) 10/24/2019   Sore neck 10/24/2019   Frequent urination 12/16/2018   Obstructive sleep apnea 12/16/2018   Type 2 diabetes mellitus without complication, without long-term current use of insulin ([place] 12/16/2018   Incomplete emptying of bladder 11/30/2017   Other insomnia 03/05/2017   Knee pain, left 12/22/2016   ESR raised 11/27/2016   Osteoarthritis of both knees 11/27/2016   Obesity (BMI 30-39.9) 11/10/2016   Abnormal EKG 10/23/2016   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/23/2016   Morbid obesity ([place] 10/23/2016   Palpitations 10/23/2016   Positive ANA (antinuclear antibody) 10/23/2016   Hypertension 10/08/2015   Chronic fatigue 10/08/2015   Diabetes mellitus ([place] 10/08/2015   Glaucoma 10/08/2015   Major depressive disorder in partial remission ([place] 10/08/2015   Vitamin D deficiency 10/08/2015   Sleep apnea 10/08/2015   History of pancreatitis 07/11/2014    ERennie Natter PT, DPT 06/24/2021, 11:51 AM  [facility] [place] [address]Phone: [phone]  Fax:  [phone] Name: CClemmie BuelnaMRN: [id number] of Birth: 5February 22, 1947

## 2021-06-27 ENCOUNTER — Other Ambulatory Visit: Payer: Self-pay

## 2021-06-27 ENCOUNTER — Ambulatory Visit: Payer: Medicare HMO

## 2021-06-27 DIAGNOSIS — R2689 Other abnormalities of gait and mobility: Secondary | ICD-10-CM

## 2021-06-27 DIAGNOSIS — R252 Cramp and spasm: Secondary | ICD-10-CM

## 2021-06-27 DIAGNOSIS — M5442 Lumbago with sciatica, left side: Secondary | ICD-10-CM | POA: Diagnosis not present

## 2021-06-27 DIAGNOSIS — M6281 Muscle weakness (generalized): Secondary | ICD-10-CM | POA: Diagnosis not present

## 2021-06-27 DIAGNOSIS — G8929 Other chronic pain: Secondary | ICD-10-CM

## 2021-06-27 NOTE — Therapy (Signed)
[facility] [address]  [place], [place], [address] Phone: [phone]   Fax:  [phone]  Physical Therapy Treatment  Patient Details  Name: Brenda Mcfarland MRN: [id number] Date of Birth: 1946/07/18 Referring Provider (PT): Sherrie Mustache   Encounter Date: 06/27/2021   PT End of Session - 06/27/21 1156     Visit Number 7    Number of Visits 12    Date for PT Re-Evaluation 07/15/21    Authorization Type Medicare + State Health    Progress Note Due on Visit 10    PT Start Time 1113   pt late   PT Stop Time 1145    PT Time Calculation (min) 32 min    Activity Tolerance Patient tolerated treatment well    Behavior During Therapy [facility] for tasks assessed/performed             Past Medical History:  Diagnosis Date   Abnormal EKG 10/23/2016   Arthritis 01/18/2020   Chronic fatigue 10/08/2015   Depression 01/18/2020   Diabetes mellitus ([place]) 10/08/2015   Formatting of this note might be different from the original. Overview:  last Hgb A1C 7.2/no blood sugars at home Formatting of this note might be different from the original. last Hgb A1C 7.2/no blood sugars at home   ESR raised 11/27/2016   Frequent urination 12/16/2018   Glaucoma 10/08/2015   High cholesterol    History of pancreatitis 07/11/2014   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/23/2016   Hypertension 10/08/2015   IFG (impaired fasting glucose) 10/24/2019   Incomplete emptying of bladder 11/30/2017   Knee pain, left 12/22/2016   Loss of memory 01/18/2020   Major depressive disorder in partial remission ([place]) 10/08/2015   Morbid obesity ([place]) 10/23/2016   Obesity (BMI 30-39.9) 11/10/2016   Obstructive sleep apnea 12/16/2018   Osteoarthritis of both knees 11/27/2016   Other insomnia 03/05/2017   Palpitations 10/23/2016   Positive ANA (antinuclear antibody) 10/23/2016   Sleep apnea 10/08/2015   Formatting of this note might be different from the original. Overview:  does not use CPAP  Formatting of this note might be different from the original. does not use CPAP   Sore neck 10/24/2019   Spondylosis of cervical spine with myelopathy 01/18/2020   Type 2 diabetes mellitus without complication, without long-term current use of insulin ([place]) 12/16/2018   Vitamin D deficiency 10/08/2015    Past Surgical History:  Procedure Laterality Date   APPENDECTOMY     age 75   CESAREAN SECTION     X 3   NECK SURGERY  2015   SPINE SURGERY  05/29/2015    There were no vitals filed for this visit.   Subjective Assessment - 06/27/21 1116     Subjective Pt doing well, the postural exercises have been helping.    Pertinent History chronic low back pain, neck pain, previous neck surgery, recent COVID infections, brain fog, memory impairments, T2DM, OA bil knees, glaucoma    Diagnostic tests x-rays with chiropracter    Patient Stated Goals be coordinated, stand up straight, walk around the block, work on balance    Currently in Pain? No/denies                               [facility] Adult PT Treatment/Exercise - 06/27/21 0001       Ambulation/Gait   Ambulation/Gait Yes    Ambulation Distance (Feet) 130  Feet    Gait Pattern Step-through pattern;Decreased stance time - left;Decreased step length - right;Poor foot clearance - left      Lumbar Exercises: Stretches   Other Lumbar Stretch Exercise corner pec stretch for posture 3x15      Lumbar Exercises: Aerobic   Nustep L5 x 6 min      Lumbar Exercises: Seated   Other Seated Lumbar Exercises rows and extension 2x10 red TB                 Balance Exercises - 06/27/21 0001       Balance Exercises: Standing   Standing Eyes Closed Narrow base of support (BOS);Solid surface;2 reps;30 secs    Standing Eyes Closed Limitations additional 2x30" wih head turns                PT Education - 06/27/21 1155     Education Details HEP update: Access Code: [email or web address]    Person(s) Educated Patient    Methods  Explanation;Demonstration;Handout    Comprehension Verbalized understanding;Returned demonstration              PT Short Term Goals - 06/27/21 1156       PT SHORT TERM GOAL #1   Title Ind with initial HEP    Time 2    Period Weeks    Status Achieved   06/27/21   Target Date 06/17/21      PT SHORT TERM GOAL #2   Title Complete DGI/Berg to check for balance impairments.    Time 2    Period Weeks    Status Achieved   DGI 17/24   Target Date 06/17/21               PT Long Term Goals - 06/24/21 1150       PT LONG TERM GOAL #1   Title Ind with progressed HEP to improve outcomes.    Time 6    Period Weeks    Status On-going   06/17/21- progressed 10/25- progressed adding standing counter exercises.     PT LONG TERM GOAL #2   Title Improve walking tolerance to 1000' without limitation from back/leg pain    Baseline <50'    Time 6    Period Weeks    Status On-going   06/17/21- completed 5 min on treadmill     PT LONG TERM GOAL #3   Title Patient will improve score on DGI to >19/24 to decrease risk of falls.    Baseline 17/24 - high risk for falls.    Time 6    Period Weeks    Status On-going      PT LONG TERM GOAL #4   Title Pt. will report 75% improvement in low back pain.    Time 6    Period Weeks    Status On-going   06/17/21- no report of pain today  06/24/21- no pain today.                  Plan - 06/27/21 1158     Clinical Impression Statement Pt arrived late session today. Updated HEP with postural exercises to improve strength in periscaps to improve upright posture. Cues needed to remain upright and keeping shoulders tucked with rows. Adjustments required with corner pec stretch for comfort and to target the pecs. Followed exercises with gait training after work on posture, cues needed for improved step through pattern. Pt responded well.    Personal Factors and Comorbidities Comorbidity  3+;Other    Comorbidities history neck pain/surgery,  chronic LBP, recent COVID infection with brain fog, glaucoma, obesity, bil knee OA, T2DM.    PT Frequency 2x / week    PT Duration 6 weeks    PT Treatment/Interventions ADLs/Self Care Home Management;Cryotherapy;Electrical Stimulation;Iontophoresis 52m/ml Dexamethasone;Moist Heat;Traction;Ultrasound;Gait training;Stair training;Functional mobility training;Therapeutic activities;Therapeutic exercise;Balance training;Neuromuscular re-education;Patient/family education;Manual techniques;Passive range of motion;Dry needling;Taping;Joint Manipulations;Spinal Manipulations    PT Next Visit Plan LE strengthening for core/LE/balance, manual to low back, modalities PRN    PT Home Exercise Plan Access Code: [email or web address] Access Code: [email or web address](10/21)    Consulted and Agree with Plan of Care Patient             Patient will benefit from skilled therapeutic intervention in order to improve the following deficits and impairments:  Decreased activity tolerance, Decreased endurance, Decreased range of motion, Decreased strength, Increased fascial restricitons, Improper body mechanics, Pain, Postural dysfunction, Impaired flexibility, Increased muscle spasms, Decreased balance, Decreased coordination, Decreased safety awareness  Visit Diagnosis: Chronic left-sided low back pain with left-sided sciatica  Other abnormalities of gait and mobility  Muscle weakness (generalized)  Cramp and spasm     Problem List Patient Active Problem List   Diagnosis Date Noted   Paroxysmal SVT (supraventricular tachycardia) ([place] 01/22/2020   Arthritis 01/18/2020   Depression 01/18/2020   Loss of memory 01/18/2020   Spondylosis of cervical spine with myelopathy 01/18/2020   IFG (impaired fasting glucose) 10/24/2019   Sore neck 10/24/2019   Frequent urination 12/16/2018   Obstructive sleep apnea 12/16/2018   Type 2 diabetes mellitus without complication, without long-term current use of insulin ([place] 12/16/2018    Incomplete emptying of bladder 11/30/2017   Other insomnia 03/05/2017   Knee pain, left 12/22/2016   ESR raised 11/27/2016   Osteoarthritis of both knees 11/27/2016   Obesity (BMI 30-39.9) 11/10/2016   Abnormal EKG 10/23/2016   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/23/2016   Morbid obesity ([place] 10/23/2016   Palpitations 10/23/2016   Positive ANA (antinuclear antibody) 10/23/2016   Hypertension 10/08/2015   Chronic fatigue 10/08/2015   Diabetes mellitus ([place] 10/08/2015   Glaucoma 10/08/2015   Major depressive disorder in partial remission ([place] 10/08/2015   Vitamin D deficiency 10/08/2015   Sleep apnea 10/08/2015   History of pancreatitis 07/11/2014    BArtist Pais PTA 06/27/2021, 12:17 PM  [facility] [address] [place] [address]Phone: [phone]  Fax:  [phone] Name: CArkie TagliaferroMRN: [id number] of Birth: [birth year]

## 2021-07-01 ENCOUNTER — Other Ambulatory Visit: Payer: Self-pay

## 2021-07-01 ENCOUNTER — Encounter: Payer: Self-pay | Admitting: Physical Therapy

## 2021-07-01 ENCOUNTER — Ambulatory Visit: Payer: Medicare HMO | Attending: Nurse Practitioner | Admitting: Physical Therapy

## 2021-07-01 DIAGNOSIS — R252 Cramp and spasm: Secondary | ICD-10-CM | POA: Insufficient documentation

## 2021-07-01 DIAGNOSIS — R2689 Other abnormalities of gait and mobility: Secondary | ICD-10-CM | POA: Insufficient documentation

## 2021-07-01 DIAGNOSIS — M5442 Lumbago with sciatica, left side: Secondary | ICD-10-CM | POA: Diagnosis not present

## 2021-07-01 DIAGNOSIS — G8929 Other chronic pain: Secondary | ICD-10-CM | POA: Diagnosis not present

## 2021-07-01 DIAGNOSIS — M6281 Muscle weakness (generalized): Secondary | ICD-10-CM | POA: Diagnosis not present

## 2021-07-01 NOTE — Patient Instructions (Signed)
Access Code: [email or web address] URL: [email or web address] Date: 07/01/2021 Prepared by: Harrie Foreman  Exercises Clamshell - 1 x daily - 7 x weekly - 2 sets - 10 reps Sidelying Thoracic Rotation with Open Book - 1 x daily - 7 x weekly - 2 sets - 10 reps Prone Knee Flexion - 1 x daily - 7 x weekly - 2 sets - 10 reps

## 2021-07-01 NOTE — Therapy (Signed)
[facility] [address]  [place], [place], [address] Phone: [phone]   Fax:  [phone]  Physical Therapy Treatment  Patient Details  Name: Brenda Mcfarland MRN: [id number] Date of Birth: 12/01/1945 Referring Provider (PT): Sherrie Mustache   Encounter Date: 07/01/2021   PT End of Session - 07/01/21 1152     Visit Number 8    Number of Visits 12    Date for PT Re-Evaluation 07/15/21    Authorization Type Medicare + State Health    Progress Note Due on Visit 10    PT Start Time 1101    PT Stop Time 1141    PT Time Calculation (min) 40 min    Activity Tolerance Patient tolerated treatment well    Behavior During Therapy [facility] for tasks assessed/performed             Past Medical History:  Diagnosis Date   Abnormal EKG 10/23/2016   Arthritis 01/18/2020   Chronic fatigue 10/08/2015   Depression 01/18/2020   Diabetes mellitus ([place]) 10/08/2015   Formatting of this note might be different from the original. Overview:  last Hgb A1C 7.2/no blood sugars at home Formatting of this note might be different from the original. last Hgb A1C 7.2/no blood sugars at home   ESR raised 11/27/2016   Frequent urination 12/16/2018   Glaucoma 10/08/2015   High cholesterol    History of pancreatitis 07/11/2014   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/23/2016   Hypertension 10/08/2015   IFG (impaired fasting glucose) 10/24/2019   Incomplete emptying of bladder 11/30/2017   Knee pain, left 12/22/2016   Loss of memory 01/18/2020   Major depressive disorder in partial remission ([place]) 10/08/2015   Morbid obesity ([place]) 10/23/2016   Obesity (BMI 30-39.9) 11/10/2016   Obstructive sleep apnea 12/16/2018   Osteoarthritis of both knees 11/27/2016   Other insomnia 03/05/2017   Palpitations 10/23/2016   Positive ANA (antinuclear antibody) 10/23/2016   Sleep apnea 10/08/2015   Formatting of this note might be different from the original. Overview:  does not use CPAP Formatting of  this note might be different from the original. does not use CPAP   Sore neck 10/24/2019   Spondylosis of cervical spine with myelopathy 01/18/2020   Type 2 diabetes mellitus without complication, without long-term current use of insulin ([place]) 12/16/2018   Vitamin D deficiency 10/08/2015    Past Surgical History:  Procedure Laterality Date   APPENDECTOMY     age 37   CESAREAN SECTION     X 3   NECK SURGERY  2015   SPINE SURGERY  05/29/2015    There were no vitals filed for this visit.   Subjective Assessment - 07/01/21 1113     Subjective Patient reports she still has pain down left side of body starting in L side low back/hip.  Bad this weekend, but better today.    Pertinent History chronic low back pain, neck pain, previous neck surgery, recent COVID infections, brain fog, memory impairments, T2DM, OA bil knees, glaucoma    Diagnostic tests x-rays with chiropracter    Patient Stated Goals be coordinated, stand up straight, walk around the block, work on balance    Currently in Pain? Yes    Pain Score 2     Pain Location Back    Pain Orientation Lower;Left    Pain Descriptors / Indicators Aching  [facility] Adult PT Treatment/Exercise - 07/01/21 0001       Exercises   Exercises Lumbar;Knee/Hip      Lumbar Exercises: Stretches   Other Lumbar Stretch Exercise open book stretchs x 10 bil      Lumbar Exercises: Aerobic   Nustep L5 x 6 min      Lumbar Exercises: Seated   Other Seated Lumbar Exercises seated stretch with swiss ball foward and to side      Lumbar Exercises: Sidelying   Clam Both;20 reps    Clam Limitations cues for technique      Lumbar Exercises: Prone   Other Prone Lumbar Exercises prone knee bends 2 x 10 bil      Manual Therapy   Manual Therapy Soft tissue mobilization;Myofascial release;Joint mobilization    Manual therapy comments to decrease muscle spasm and pain    Joint Mobilization L UPA mobs to  lumbar spine grade 2-3    Soft tissue mobilization IASTM with foam roller to L gluts, QL, piriformis    Myofascial Release MFR to L QL                     PT Education - 07/01/21 1157     Education Details HEP update: Access Code: [email or web address]    Person(s) Educated Patient    Methods Explanation;Demonstration;Verbal cues;Handout    Comprehension Verbalized understanding;Returned demonstration              PT Short Term Goals - 06/27/21 1156       PT SHORT TERM GOAL #1   Title Ind with initial HEP    Time 2    Period Weeks    Status Achieved   06/27/21   Target Date 06/17/21      PT SHORT TERM GOAL #2   Title Complete DGI/Berg to check for balance impairments.    Time 2    Period Weeks    Status Achieved   DGI 17/24   Target Date 06/17/21               PT Long Term Goals - 07/01/21 1156       PT LONG TERM GOAL #1   Title Ind with progressed HEP to improve outcomes.    Time 6    Period Weeks    Status On-going   06/17/21- progressed 10/25- progressed adding standing counter exercises.     PT LONG TERM GOAL #2   Title Improve walking tolerance to 1000' without limitation from back/leg pain    Baseline <50'    Time 6    Period Weeks    Status On-going   06/17/21- completed 5 min on treadmill     PT LONG TERM GOAL #3   Title Patient will improve score on DGI to >19/24 to decrease risk of falls.    Baseline 17/24 - high risk for falls.    Time 6    Period Weeks    Status On-going      PT LONG TERM GOAL #4   Title Pt. will report 75% improvement in low back pain.    Time 6    Period Weeks    Status On-going   06/17/21- no report of pain today  06/24/21- no pain today.                  Plan - 07/01/21 1152     Clinical Impression Statement Patient reports overall improvement and ability to stand longer now, also  paying more attention to posture.  She reports good compliance with HEP.  Today focused on hip strengthening and rotational  exercises to decrease complaint L sided LB/hip pain, and manual therapy to area to decrease muscle spasm and pain.  Responded very positively to open book stretch today.  She would benefit from continued skilled therapy.    Personal Factors and Comorbidities Comorbidity 3+;Other    Comorbidities history neck pain/surgery, chronic LBP, recent COVID infection with brain fog, glaucoma, obesity, bil knee OA, T2DM.    PT Frequency 2x / week    PT Duration 6 weeks    PT Treatment/Interventions ADLs/Self Care Home Management;Cryotherapy;Electrical Stimulation;Iontophoresis 45m/ml Dexamethasone;Moist Heat;Traction;Ultrasound;Gait training;Stair training;Functional mobility training;Therapeutic activities;Therapeutic exercise;Balance training;Neuromuscular re-education;Patient/family education;Manual techniques;Passive range of motion;Dry needling;Taping;Joint Manipulations;Spinal Manipulations    PT Next Visit Plan LE strengthening for core/LE/balance, manual to low back, modalities PRN    PT Home Exercise Plan Access Code: [email or web address] Access Code: [email or web address](10/21)    Consulted and Agree with Plan of Care Patient             Patient will benefit from skilled therapeutic intervention in order to improve the following deficits and impairments:  Decreased activity tolerance, Decreased endurance, Decreased range of motion, Decreased strength, Increased fascial restricitons, Improper body mechanics, Pain, Postural dysfunction, Impaired flexibility, Increased muscle spasms, Decreased balance, Decreased coordination, Decreased safety awareness  Visit Diagnosis: Chronic left-sided low back pain with left-sided sciatica  Other abnormalities of gait and mobility  Muscle weakness (generalized)  Cramp and spasm     Problem List Patient Active Problem List   Diagnosis Date Noted   Paroxysmal SVT (supraventricular tachycardia) ([place] 01/22/2020   Arthritis 01/18/2020   Depression 01/18/2020   Loss of  memory 01/18/2020   Spondylosis of cervical spine with myelopathy 01/18/2020   IFG (impaired fasting glucose) 10/24/2019   Sore neck 10/24/2019   Frequent urination 12/16/2018   Obstructive sleep apnea 12/16/2018   Type 2 diabetes mellitus without complication, without long-term current use of insulin ([place] 12/16/2018   Incomplete emptying of bladder 11/30/2017   Other insomnia 03/05/2017   Knee pain, left 12/22/2016   ESR raised 11/27/2016   Osteoarthritis of both knees 11/27/2016   Obesity (BMI 30-39.9) 11/10/2016   Abnormal EKG 10/23/2016   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/23/2016   Morbid obesity ([place] 10/23/2016   Palpitations 10/23/2016   Positive ANA (antinuclear antibody) 10/23/2016   Hypertension 10/08/2015   Chronic fatigue 10/08/2015   Diabetes mellitus ([place] 10/08/2015   Glaucoma 10/08/2015   Major depressive disorder in partial remission ([place] 10/08/2015   Vitamin D deficiency 10/08/2015   Sleep apnea 10/08/2015   History of pancreatitis 07/11/2014    ERennie Natter PT, DPT 07/01/2021, 11:58 AM  [facility] [place] [address]Phone: [phone]  Fax:  [phone] Name: CCade OlberdingMRN: [id number] of Birth: 5Aug 05, 1947

## 2021-07-03 ENCOUNTER — Encounter: Payer: Self-pay | Admitting: Nurse Practitioner

## 2021-07-03 ENCOUNTER — Other Ambulatory Visit: Payer: Self-pay

## 2021-07-03 ENCOUNTER — Ambulatory Visit (INDEPENDENT_AMBULATORY_CARE_PROVIDER_SITE_OTHER): Payer: Medicare HMO | Admitting: Nurse Practitioner

## 2021-07-03 DIAGNOSIS — Z0000 Encounter for general adult medical examination without abnormal findings: Secondary | ICD-10-CM

## 2021-07-03 NOTE — Patient Instructions (Signed)
Ms. Ortner , Thank you for taking time to come for your Medicare Wellness Visit. I appreciate your ongoing commitment to your health goals. Please review the following plan we discussed and let me know if I can assist you in the future.   Screening recommendations/referrals: Colonoscopy aged out Mammogram aged out Bone Density declined at this time.  Recommended yearly ophthalmology/optometry visit for glaucoma screening and checkup Recommended yearly dental visit for hygiene and checkup  Vaccinations: Influenza vaccine declined Pneumococcal vaccine declined Tdap vaccine up to date Shingles vaccine declined vaccine    Advanced directives: recommend to bring to office to place on file.   Conditions/risks identified: advance age, obesity, hyperlipidemia, hypertension, family hx of premature cardiac disease.   Next appointment: yearly.    Preventive Care 15 Years and Older, Female Preventive care refers to lifestyle choices and visits with your health care provider that can promote health and wellness. What does preventive care include? A yearly physical exam. This is also called an annual well check. Dental exams once or twice a year. Routine eye exams. Ask your health care provider how often you should have your eyes checked. Personal lifestyle choices, including: Daily care of your teeth and gums. Regular physical activity. Eating a healthy diet. Avoiding tobacco and drug use. Limiting alcohol use. Practicing safe sex. Taking low-dose aspirin every day. Taking vitamin and mineral supplements as recommended by your health care provider. What happens during an annual well check? The services and screenings done by your health care provider during your annual well check will depend on your age, overall health, lifestyle risk factors, and family history of disease. Counseling  Your health care provider may ask you questions about your: Alcohol use. Tobacco use. Drug  use. Emotional well-being. Home and relationship well-being. Sexual activity. Eating habits. History of falls. Memory and ability to understand (cognition). Work and work Astronomer. Reproductive health. Screening  You may have the following tests or measurements: Height, weight, and BMI. Blood pressure. Lipid and cholesterol levels. These may be checked every 5 years, or more frequently if you are over 64 years old. Skin check. Lung cancer screening. You may have this screening every year starting at age 105 if you have a 30-pack-year history of smoking and currently smoke or have quit within the past 15 years. Fecal occult blood test (FOBT) of the stool. You may have this test every year starting at age 25. Flexible sigmoidoscopy or colonoscopy. You may have a sigmoidoscopy every 5 years or a colonoscopy every 10 years starting at age 18. Hepatitis C blood test. Hepatitis B blood test. Sexually transmitted disease (STD) testing. Diabetes screening. This is done by checking your blood sugar (glucose) after you have not eaten for a while (fasting). You may have this done every 1-3 years. Bone density scan. This is done to screen for osteoporosis. You may have this done starting at age 110. Mammogram. This may be done every 1-2 years. Talk to your health care provider about how often you should have regular mammograms. Talk with your health care provider about your test results, treatment options, and if necessary, the need for more tests. Vaccines  Your health care provider may recommend certain vaccines, such as: Influenza vaccine. This is recommended every year. Tetanus, diphtheria, and acellular pertussis (Tdap, Td) vaccine. You may need a Td booster every 10 years. Zoster vaccine. You may need this after age 29. Pneumococcal 13-valent conjugate (PCV13) vaccine. One dose is recommended after age 34. Pneumococcal polysaccharide (PPSV23) vaccine.  One dose is recommended after age  9. Talk to your health care provider about which screenings and vaccines you need and how often you need them. This information is not intended to replace advice given to you by your health care provider. Make sure you discuss any questions you have with your health care provider. Document Released: 09/13/2015 Document Revised: 05/06/2016 Document Reviewed: 06/18/2015 Elsevier Interactive Patient Education  2017 [place] Prevention in the Home Falls can cause injuries. They can happen to people of all ages. There are many things you can do to make your home safe and to help prevent falls. What can I do on the outside of my home? Regularly fix the edges of walkways and driveways and fix any cracks. Remove anything that might make you trip as you walk through a door, such as a raised step or threshold. Trim any bushes or trees on the path to your home. Use bright outdoor lighting. Clear any walking paths of anything that might make someone trip, such as rocks or tools. Regularly check to see if handrails are loose or broken. Make sure that both sides of any steps have handrails. Any raised decks and porches should have guardrails on the edges. Have any leaves, snow, or ice cleared regularly. Use sand or salt on walking paths during winter. Clean up any spills in your garage right away. This includes oil or grease spills. What can I do in the bathroom? Use night lights. Install grab bars by the toilet and in the tub and shower. Do not use towel bars as grab bars. Use non-skid mats or decals in the tub or shower. If you need to sit down in the shower, use a plastic, non-slip stool. Keep the floor dry. Clean up any water that spills on the floor as soon as it happens. Remove soap buildup in the tub or shower regularly. Attach bath mats securely with double-sided non-slip rug tape. Do not have throw rugs and other things on the floor that can make you trip. What can I do in the  bedroom? Use night lights. Make sure that you have a light by your bed that is easy to reach. Do not use any sheets or blankets that are too big for your bed. They should not hang down onto the floor. Have a firm chair that has side arms. You can use this for support while you get dressed. Do not have throw rugs and other things on the floor that can make you trip. What can I do in the kitchen? Clean up any spills right away. Avoid walking on wet floors. Keep items that you use a lot in easy-to-reach places. If you need to reach something above you, use a strong step stool that has a grab bar. Keep electrical cords out of the way. Do not use floor polish or wax that makes floors slippery. If you must use wax, use non-skid floor wax. Do not have throw rugs and other things on the floor that can make you trip. What can I do with my stairs? Do not leave any items on the stairs. Make sure that there are handrails on both sides of the stairs and use them. Fix handrails that are broken or loose. Make sure that handrails are as long as the stairways. Check any carpeting to make sure that it is firmly attached to the stairs. Fix any carpet that is loose or worn. Avoid having throw rugs at the top or bottom of  the stairs. If you do have throw rugs, attach them to the floor with carpet tape. Make sure that you have a light switch at the top of the stairs and the bottom of the stairs. If you do not have them, ask someone to add them for you. What else can I do to help prevent falls? Wear shoes that: Do not have high heels. Have rubber bottoms. Are comfortable and fit you well. Are closed at the toe. Do not wear sandals. If you use a stepladder: Make sure that it is fully opened. Do not climb a closed stepladder. Make sure that both sides of the stepladder are locked into place. Ask someone to hold it for you, if possible. Clearly mark and make sure that you can see: Any grab bars or  handrails. First and last steps. Where the edge of each step is. Use tools that help you move around (mobility aids) if they are needed. These include: Canes. Walkers. Scooters. Crutches. Turn on the lights when you go into a dark area. Replace any light bulbs as soon as they burn out. Set up your furniture so you have a clear path. Avoid moving your furniture around. If any of your floors are uneven, fix them. If there are any pets around you, be aware of where they are. Review your medicines with your doctor. Some medicines can make you feel dizzy. This can increase your chance of falling. Ask your doctor what other things that you can do to help prevent falls. This information is not intended to replace advice given to you by your health care provider. Make sure you discuss any questions you have with your health care provider. Document Released: 06/13/2009 Document Revised: 01/23/2016 Document Reviewed: 09/21/2014 Elsevier Interactive Patient Education  2017 [employer].

## 2021-07-03 NOTE — Progress Notes (Signed)
Subjective:   Brenda Mcfarland is a 75 y.o. female who presents for Medicare Annual (Subsequent) preventive examination.  Review of Systems     Cardiac Risk Factors include: sedentary lifestyle;obesity (BMI >30kg/m2);family history of premature cardiovascular disease;advanced age (>46mn, >>22women);hypertension;dyslipidemia     Objective:    Today's Vitals   07/03/21 1628  PainSc: 4    There is no height or weight on file to calculate BMI.  Advanced Directives 07/03/2021 06/03/2021 05/02/2021 11/18/2020  Does Patient Have a Medical Advance Directive? Yes Yes No Yes  Type of AParamedicof [place]o [place] will [place] will - -  Does patient want to make changes to medical advance directive? No - Patient declined - - No - Patient declined  Copy of HBalmin Chart? No - copy requested No - copy requested - -  Would patient like information on creating a medical advance directive? - - No - Patient declined -    Current Medications (verified) Outpatient Encounter Medications as of 07/03/2021  Medication Sig   latanoprost (XALATAN) 0.005 % ophthalmic solution Place 1 drop into both eyes at bedtime.   losartan (COZAAR) 50 MG tablet Take 1 tablet by mouth daily.   MAGNESIUM GLYCINATE PO Take 350 mg by mouth.   melatonin 5 MG TABS Take by mouth at bedtime. 1-2 tablets   OVER THE COUNTER MEDICATION daily. Essential Oils   traZODone (DESYREL) 150 MG tablet Take one tablet by mouth once daily at bedtime.   [DISCONTINUED] losartan (COZAAR) 25 MG tablet Take 1 tablet (25 mg total) by mouth daily.   nystatin cream (MYCOSTATIN) Apply 1 application topically 2 (two) times daily. (Patient not taking: Reported on 07/03/2021)   No facility-administered encounter medications on file as of 07/03/2021.    Allergies (verified) Amlodipine, Aripiprazole, Carvedilol, Paroxetine hcl, Tizanidine, Tramadol, Zolpidem, and Zyrtec [cetirizine  hcl]   History: Past Medical History:  Diagnosis Date   Abnormal EKG 10/23/2016   Arthritis 01/18/2020   Chronic fatigue 10/08/2015   Depression 01/18/2020   Diabetes mellitus ([place] 10/08/2015   Formatting of this note might be different from the original. Overview:  last Hgb A1C 7.2/no blood sugars at home Formatting of this note might be different from the original. last Hgb A1C 7.2/no blood sugars at home   ESR raised 11/27/2016   Frequent urination 12/16/2018   Glaucoma 10/08/2015   High cholesterol    History of pancreatitis 07/11/2014   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/23/2016   Hypertension 10/08/2015   IFG (impaired fasting glucose) 10/24/2019   Incomplete emptying of bladder 11/30/2017   Knee pain, left 12/22/2016   Loss of memory 01/18/2020   Major depressive disorder in partial remission ([place] 10/08/2015   Morbid obesity ([place] 10/23/2016   Obesity (BMI 30-39.9) 11/10/2016   Obstructive sleep apnea 12/16/2018   Osteoarthritis of both knees 11/27/2016   Other insomnia 03/05/2017   Palpitations 10/23/2016   Positive ANA (antinuclear antibody) 10/23/2016   Sleep apnea 10/08/2015   Formatting of this note might be different from the original. Overview:  does not use CPAP Formatting of this note might be different from the original. does not use CPAP   Sore neck 10/24/2019   Spondylosis of cervical spine with myelopathy 01/18/2020   Type 2 diabetes mellitus without complication, without long-term current use of insulin ([place] 12/16/2018   Vitamin D deficiency 10/08/2015   Past Surgical History:  Procedure Laterality Date   APPENDECTOMY     age  [place]  2015   SPINE SURGERY  05/29/2015   Family History  Problem Relation Age of Onset   Diabetes Mother    Multiple sclerosis Mother    Heart disease Father    Heart Problems Father    Migraines Daughter    Thyroid disease Daughter    Migraines Daughter    Social History   Socioeconomic History   Marital status: Unknown     Spouse name: Not on file   Number of children: Not on file   Years of education: Not on file   Highest education level: Not on file  Occupational History   Not on file  Tobacco Use   Smoking status: Former    Packs/day: 1.00    Years: 10.00    Pack years: 10.00    Types: Cigarettes   Smokeless tobacco: Never  Vaping Use   Vaping Use: Never used  Substance and Sexual Activity   Alcohol use: Not Currently   Drug use: Not Currently   Sexual activity: Not on file  Other Topics Concern   Not on file  Social History Narrative   Tobacco use, amount per day now: No   Past tobacco use, amount per day: 10 years. , 1 pack daily.   How many years did you use tobacco: 10 years   Alcohol use (drinks per week): None.   Diet: Simple- Eggs, Sausage, Toast, Chicken/Beef Salad or Vegetables.    Do you drink/eat things with caffeine: No   Marital status:   Divorced                               What year were you married? 1968   Do you live in a house, apartment, assisted living, condo, trailer, etc.? House.   Is it one or more stories? One   How many persons live in your home? Self.   Do you have pets in your home?( please list) [place]    Highest Level of education completed? Undergrad.    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   Do you exercise? No                                 Type and how often?   Do you have a living will? No   Do you have a DNR form? Yes                                  If not, do you want to discuss one? No   Do you have signed POA/HPOA forms? Yes                       If so, please bring to you appointment      Do you have any difficulty bathing or dressing yourself? Yes   Do you have any difficulty preparing food or eating? Yes   Do you have any difficulty managing your medications? No   Do you have any difficulty managing your finances? No   Do you have any difficulty affording your medications? No   Social Determinants of  Radio broadcast assistant Strain: Not on file  Food  Insecurity: Not on file  Transportation Needs: Not on file  Physical Activity: Not on file  Stress: Not on file  Social Connections: Not on file    Tobacco Counseling Counseling given: Not Answered   Clinical Intake:  Pre-visit preparation completed: Yes  Pain : 0-10 Pain Score: 4  Pain Type: Chronic pain Pain Location: Neck Pain Orientation: Right Pain Radiating Towards: shoulders Pain Descriptors / Indicators: Sore Pain Onset: More than a month ago Pain Frequency: Intermittent Pain Relieving Factors: heat and exercise,  Pain Relieving Factors: heat and exercise,  BMI - recorded: 36 Diabetes: No  How often do you need to have someone help you when you read instructions, pamphlets, or other written materials from your doctor or pharmacy?: 1 - Never  Diabetic?no         Activities of Daily Living In your present state of health, do you have any difficulty performing the following activities: 07/03/2021  Hearing? Y  Vision? N  Difficulty concentrating or making decisions? Y  Comment at times remembering things  Walking or climbing stairs? N  Dressing or bathing? N  Doing errands, shopping? N  Preparing Food and eating ? N  Using the Toilet? N  In the past six months, have you accidently leaked urine? N  Do you have problems with loss of bowel control? N  Managing your Medications? N  Managing your Finances? N  Housekeeping or managing your Housekeeping? N  Some recent data might be hidden    Patient Care Team: Lauree Chandler, NP as PCP - General (Geriatric Medicine) Berniece Salines, DO as PCP - Cardiology (Cardiology) Murlean Iba, MD as Referring Physician (Orthopedic Surgery) Park Liter, MD as Consulting Physician (Cardiology)  Indicate any recent Medical Services you may have received from other than [facility] providers in the past year (date may be approximate).      Assessment:   This is a routine wellness examination for Brenda Mcfarland.  Hearing/Vision screen Hearing Screening - Comments:: Patient has some hearing problems. Has tried Hearing aids,she has been to audiologist. Hearing aids didn't seem to help. Vision Screening - Comments:: No problems with vision. Patient had eye exam within past year. Patient sees Dr. Luvenia Starch  Dietary issues and exercise activities discussed: Current Exercise Habits: Structured exercise class, Type of exercise: calisthenics, Time (Minutes): 30, Frequency (Times/Week): 2, Weekly Exercise (Minutes/Week): 60   Goals Addressed   None    Depression Screen PHQ 2/9 Scores 07/03/2021  PHQ - 2 Score 0    Fall Risk Fall Risk  07/03/2021 05/02/2021  Falls in the past year? 0 0  Number falls in past yr: 0 0  Injury with Fall? 0 0  Risk for fall due to : No Fall Risks No Fall Risks  Follow up Falls evaluation completed Falls evaluation completed    [place]:  Any stairs in or around the home? Yes  If so, are there any without handrails? No  Home free of loose throw rugs in walkways, pet beds, electrical cords, etc? Yes  Adequate lighting in your home to reduce risk of falls? Yes   ASSISTIVE DEVICES UTILIZED TO PREVENT FALLS:  Life alert? No  Use of a cane, walker or w/c? No  Grab bars in the bathroom? Yes  Shower chair or bench in shower? No  Elevated toilet seat or a handicapped toilet? Yes   TIMED UP AND GO:  Was the test performed? No .    Cognitive  Function:     6CIT Screen 07/03/2021  What Year? 0 points  What month? 0 points  What time? 0 points  Count back from 20 0 points  Months in reverse 0 points  Repeat phrase 0 points  Total Score 0    Immunizations Immunization History  Administered Date(s) Administered   Tdap 06/30/2018    TDAP status: Up to date  Flu Vaccine status: Declined, Education has been provided regarding the importance of this vaccine  but patient still declined. Advised may receive this vaccine at local pharmacy or Health Dept. Aware to provide a copy of the vaccination record if obtained from local pharmacy or Health Dept. Verbalized acceptance and understanding.  Pneumococcal vaccine status: Declined,  Education has been provided regarding the importance of this vaccine but patient still declined. Advised may receive this vaccine at local pharmacy or Health Dept. Aware to provide a copy of the vaccination record if obtained from local pharmacy or Health Dept. Verbalized acceptance and understanding.   Covid-19 vaccine status: Declined, Education has been provided regarding the importance of this vaccine but patient still declined. Advised may receive this vaccine at local pharmacy or Health Dept.or vaccine clinic. Aware to provide a copy of the vaccination record if obtained from local pharmacy or Health Dept. Verbalized acceptance and understanding.  Qualifies for Shingles Vaccine? Yes   Zostavax completed No   Shingrix Completed?: No.    Education has been provided regarding the importance of this vaccine. Patient has been advised to call insurance company to determine out of pocket expense if they have not yet received this vaccine. Advised may also receive vaccine at local pharmacy or Health Dept. Verbalized acceptance and understanding.  Screening Tests Health Maintenance  Topic Date Due   FOOT EXAM  Never done   OPHTHALMOLOGY EXAM  Never done   Hepatitis C Screening  Never done   Pneumonia Vaccine 92+ Years old (1 - PCV) 07/03/2022 (Originally 01/19/1952)   INFLUENZA VACCINE  07/03/2022 (Originally 03/31/2021)   DEXA SCAN  07/03/2022 (Originally 01/18/2010)   COVID-19 Vaccine (1) 07/03/2022 (Originally 07/21/1946)   Zoster Vaccines- Shingrix (1 of 2) 07/03/2022 (Originally 01/19/1995)   HEMOGLOBIN A1C  09/12/2021   TETANUS/TDAP  06/30/2028   HPV VACCINES  Aged Out    Health Maintenance  Health Maintenance Due   Topic Date Due   FOOT EXAM  Never done   OPHTHALMOLOGY EXAM  Never done   Hepatitis C Screening  Never done    Colorectal cancer screening: No longer required.   Mammogram status: No longer required due to aged.  Declined   Lung Cancer Screening: (Low Dose CT Chest recommended if Age 44-80 years, 30 pack-year currently smoking OR have quit w/in 15years.) does not qualify.   Lung Cancer Screening Referral: na  Additional Screening:  Hepatitis C Screening: does qualify; Complete with next blood work.   Vision Screening: Recommended annual ophthalmology exams for early detection of glaucoma and other disorders of the eye. Is the patient up to date with their annual eye exam?  Yes  Who is the provider or what is the name of the office in which the patient attends annual eye exams? [facility] If pt is not established with a provider, would they like to be referred to a provider to establish care? No .   Dental Screening: Recommended annual dental exams for proper oral hygiene  Community Resource Referral / Chronic Care Management: CRR required this visit?  No   CCM required this visit?  No      Plan:     I have personally reviewed and noted the following in the patient's chart:   Medical and social history Use of alcohol, tobacco or illicit drugs  Current medications and supplements including opioid prescriptions.  Functional ability and status Nutritional status Physical activity Advanced directives List of other physicians Hospitalizations, surgeries, and ER visits in previous 12 months Vitals Screenings to include cognitive, depression, and falls Referrals and appointments  In addition, I have reviewed and discussed with patient certain preventive protocols, quality metrics, and best practice recommendations. A written personalized care plan for preventive services as well as general preventive health recommendations were provided to patient.     Lauree Chandler,  NP   07/03/2021    Virtual Visit via Telephone Note  I connected withNAME@ on 07/03/21 at  4:15 PM EDT by telephone and verified that I am speaking with the correct person using two identifiers.  Location: Patient: home Provider: [facility].    I discussed the limitations, risks, security and privacy concerns of performing an evaluation and management service by telephone and the availability of in person appointments. I also discussed with the patient that there may be a patient responsible charge related to this service. The patient expressed understanding and agreed to proceed.   I discussed the assessment and treatment plan with the patient. The patient was provided an opportunity to ask questions and all were answered. The patient agreed with the plan and demonstrated an understanding of the instructions.   The patient was advised to call back or seek an in-person evaluation if the symptoms worsen or if the condition fails to improve as anticipated.  I provided 15 minutes of non-face-to-face time during this encounter.  Carlos American. Harle Battiest Avs printed and mailed

## 2021-07-03 NOTE — Progress Notes (Signed)
This service is provided via telemedicine  No vital signs collected/recorded due to the encounter was a telemedicine visit.   Location of patient (ex: home, work):  Home  Patient consents to a telephone visit:  Yes, see encounter dated 03/27/2021  Location of the provider (ex: office, home):  [facility]  Name of any referring provider:  N/A  Names of all persons participating in the telemedicine service and their role in the encounter:  Abbey Chatters, Nurse Practitioner, Elveria Royals, CMA, and patient.   Time spent on call:  12 minutes with medical assistant

## 2021-07-04 ENCOUNTER — Encounter: Payer: Self-pay | Admitting: Nurse Practitioner

## 2021-07-04 ENCOUNTER — Encounter: Payer: Medicare HMO | Admitting: Nurse Practitioner

## 2021-07-07 ENCOUNTER — Encounter: Payer: Self-pay | Admitting: Physical Therapy

## 2021-07-07 ENCOUNTER — Ambulatory Visit: Payer: Medicare HMO | Admitting: Physical Therapy

## 2021-07-07 ENCOUNTER — Other Ambulatory Visit: Payer: Self-pay

## 2021-07-07 DIAGNOSIS — R2689 Other abnormalities of gait and mobility: Secondary | ICD-10-CM

## 2021-07-07 DIAGNOSIS — M5442 Lumbago with sciatica, left side: Secondary | ICD-10-CM

## 2021-07-07 DIAGNOSIS — M6281 Muscle weakness (generalized): Secondary | ICD-10-CM | POA: Diagnosis not present

## 2021-07-07 DIAGNOSIS — R252 Cramp and spasm: Secondary | ICD-10-CM

## 2021-07-07 DIAGNOSIS — G8929 Other chronic pain: Secondary | ICD-10-CM | POA: Diagnosis not present

## 2021-07-07 NOTE — Therapy (Signed)
[facility] [address]  [place], [place], [address] Phone: [phone]   Fax:  [phone]  Physical Therapy Treatment  Patient Details  Name: Brenda Mcfarland MRN: [id number] Date of Birth: 03/13/1946 Referring Provider (PT): Sherrie Mustache   Encounter Date: 07/07/2021   PT End of Session - 07/07/21 1622     Visit Number 9    Number of Visits 12    Date for PT Re-Evaluation 07/15/21    Authorization Type Medicare + State Health    Progress Note Due on Visit 10    PT Start Time 1619    PT Stop Time 1704    PT Time Calculation (min) 45 min    Activity Tolerance Patient tolerated treatment well    Behavior During Therapy [facility] for tasks assessed/performed             Past Medical History:  Diagnosis Date   Abnormal EKG 10/23/2016   Arthritis 01/18/2020   Chronic fatigue 10/08/2015   Depression 01/18/2020   Diabetes mellitus ([place]) 10/08/2015   Formatting of this note might be different from the original. Overview:  last Hgb A1C 7.2/no blood sugars at home Formatting of this note might be different from the original. last Hgb A1C 7.2/no blood sugars at home   ESR raised 11/27/2016   Frequent urination 12/16/2018   Glaucoma 10/08/2015   High cholesterol    History of pancreatitis 07/11/2014   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/23/2016   Hypertension 10/08/2015   IFG (impaired fasting glucose) 10/24/2019   Incomplete emptying of bladder 11/30/2017   Knee pain, left 12/22/2016   Loss of memory 01/18/2020   Major depressive disorder in partial remission ([place]) 10/08/2015   Morbid obesity ([place]) 10/23/2016   Obesity (BMI 30-39.9) 11/10/2016   Obstructive sleep apnea 12/16/2018   Osteoarthritis of both knees 11/27/2016   Other insomnia 03/05/2017   Palpitations 10/23/2016   Positive ANA (antinuclear antibody) 10/23/2016   Sleep apnea 10/08/2015   Formatting of this note might be different from the original. Overview:  does not use CPAP Formatting of  this note might be different from the original. does not use CPAP   Sore neck 10/24/2019   Spondylosis of cervical spine with myelopathy 01/18/2020   Type 2 diabetes mellitus without complication, without long-term current use of insulin ([place]) 12/16/2018   Vitamin D deficiency 10/08/2015    Past Surgical History:  Procedure Laterality Date   APPENDECTOMY     age 75   CESAREAN SECTION     X 3   NECK SURGERY  2015   SPINE SURGERY  05/29/2015    There were no vitals filed for this visit.   Subjective Assessment - 07/07/21 1621     Subjective Patient reports she not doing so well today.  Reports neck and shoulder pain today, yesterday back was a problem.  Missed seeing her PCP last week when mixed up vitamins and sleeping pill.    Pertinent History chronic low back pain, neck pain, previous neck surgery, recent COVID infections, brain fog, memory impairments, T2DM, OA bil knees, glaucoma    Diagnostic tests x-rays with chiropracter    Patient Stated Goals be coordinated, stand up straight, walk around the block, work on balance    Currently in Pain? Yes    Pain Score 3     Pain Location Neck    Pain Orientation Right  [place] Adult PT Treatment/Exercise - 07/07/21 0001       Self-Care   Self-Care Other Self-Care Comments    Other Self-Care Comments  education on pain neuroscience, including illustrations from "Why do I hurt? Workbook.      Exercises   Exercises Lumbar;Knee/Hip      Lumbar Exercises: Aerobic   Nustep L5 x 6 min      Lumbar Exercises: Standing   Heel Raises 20 reps    Row Strengthening;10 reps;Theraband    Theraband Level (Row) Level 2 (Red)    Shoulder Extension Strengthening;Both;15 reps    Theraband Level (Shoulder Extension) Level 2 (Red)    Other Standing Lumbar Exercises standing hip extension 2 x 5 bil      Shoulder Exercises: Seated   Row Strengthening;Both;10 reps    Theraband Level (Shoulder Row)  Level 2 (Red)    Horizontal ABduction Strengthening;10 reps    Theraband Level (Shoulder Horizontal ABduction) Level 2 (Red)    Other Seated Exercises seated bicep curls x 10 RTB    Other Seated Exercises yellow theraputty squeezes, pulls, and thumb pinch (searching for pennies)                     PT Education - 07/07/21 1713     Education Details HEP update Access Code: [email or web address] Issued new RTB with tubes for hands to decrease hand pain, yellow theraputty for hands.  Also educated on pain neuroscience.    Person(s) Educated Patient    Methods Explanation;Demonstration;Verbal cues;Handout    Comprehension Verbalized understanding;Returned demonstration              PT Short Term Goals - 06/27/21 1156       PT SHORT TERM GOAL #1   Title Ind with initial HEP    Time 2    Period Weeks    Status Achieved   06/27/21   Target Date 06/17/21      PT SHORT TERM GOAL #2   Title Complete DGI/Berg to check for balance impairments.    Time 2    Period Weeks    Status Achieved   DGI 17/24   Target Date 06/17/21               PT Long Term Goals - 07/01/21 1156       PT LONG TERM GOAL #1   Title Ind with progressed HEP to improve outcomes.    Time 6    Period Weeks    Status On-going   06/17/21- progressed 10/25- progressed adding standing counter exercises.     PT LONG TERM GOAL #2   Title Improve walking tolerance to 1000' without limitation from back/leg pain    Baseline <50'    Time 6    Period Weeks    Status On-going   06/17/21- completed 5 min on treadmill     PT LONG TERM GOAL #3   Title Patient will improve score on DGI to >19/24 to decrease risk of falls.    Baseline 17/24 - high risk for falls.    Time 6    Period Weeks    Status On-going      PT LONG TERM GOAL #4   Title Pt. will report 75% improvement in low back pain.    Time 6    Period Weeks    Status On-going   06/17/21- no report of pain today  06/24/21- no pain today.  Plan - 07/07/21 1720     Clinical Impression Statement Patient reporting having an off day today, very focused on pain and discomfort.  She was given theraputty to help with complaint of hand/thumb pain, and modified theraband to avoid gripping bands too hard with exercise.  Also sat when worked theraputty and educated on pain neuroscience.  She took a picture of the cover of the pain workbook to purchase off of [employer] [employer].  Given different exercises for shoulder/back/core strengthening today, and encouraged to continue to exercise as toleated in pair free ROM.  Pt. would benefit from continued skilled therapy.    Personal Factors and Comorbidities Comorbidity 3+;Other    Comorbidities history neck pain/surgery, chronic LBP, recent COVID infection with brain fog, glaucoma, obesity, bil knee OA, T2DM.    PT Frequency 2x / week    PT Duration 6 weeks    PT Treatment/Interventions ADLs/Self Care Home Management;Cryotherapy;Electrical Stimulation;Iontophoresis 71m/ml Dexamethasone;Moist Heat;Traction;Ultrasound;Gait training;Stair training;Functional mobility training;Therapeutic activities;Therapeutic exercise;Balance training;Neuromuscular re-education;Patient/family education;Manual techniques;Passive range of motion;Dry needling;Taping;Joint Manipulations;Spinal Manipulations    PT Next Visit Plan LE strengthening for core/LE/balance, manual to low back, modalities PRN    PT Home Exercise Plan Access Code: [email or web address] Access Code: [email or web address](10/[email or web address])    Consulted and Agree with Plan of Care Patient             Patient will benefit from skilled therapeutic intervention in order to improve the following deficits and impairments:  Decreased activity tolerance, Decreased endurance, Decreased range of motion, Decreased strength, Increased fascial restricitons, Improper body mechanics, Pain, Postural dysfunction, Impaired flexibility, Increased muscle spasms, Decreased balance, Decreased  coordination, Decreased safety awareness  Visit Diagnosis: Chronic left-sided low back pain with left-sided sciatica  Other abnormalities of gait and mobility  Muscle weakness (generalized)  Cramp and spasm     Problem List Patient Active Problem List   Diagnosis Date Noted   Paroxysmal SVT (supraventricular tachycardia) ([place] 01/22/2020   Arthritis 01/18/2020   Depression 01/18/2020   Loss of memory 01/18/2020   Spondylosis of cervical spine with myelopathy 01/18/2020   IFG (impaired fasting glucose) 10/24/2019   Sore neck 10/24/2019   Frequent urination 12/16/2018   Obstructive sleep apnea 12/16/2018   Type 2 diabetes mellitus without complication, without long-term current use of insulin ([place] 12/16/2018   Incomplete emptying of bladder 11/30/2017   Other insomnia 03/05/2017   Knee pain, left 12/22/2016   ESR raised 11/27/2016   Osteoarthritis of both knees 11/27/2016   Obesity (BMI 30-39.9) 11/10/2016   Abnormal EKG 10/23/2016   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/23/2016   Morbid obesity ([place] 10/23/2016   Palpitations 10/23/2016   Positive ANA (antinuclear antibody) 10/23/2016   Hypertension 10/08/2015   Chronic fatigue 10/08/2015   Diabetes mellitus ([place] 10/08/2015   Glaucoma 10/08/2015   Major depressive disorder in partial remission ([place] 10/08/2015   Vitamin D deficiency 10/08/2015   Sleep apnea 10/08/2015   History of pancreatitis 07/11/2014    ERennie Natter PT, DPT 07/07/2021, 5:32 PM  [facility] [address] [place] [address]Phone: [phone]  Fax:  [phone] Name: CKyley SolowMRN: [id number] of Birth: 5March 03, 1947

## 2021-07-07 NOTE — Patient Instructions (Signed)
Access Code: [email or web address] URL: [email or web address] Date: 07/07/2021 Prepared by: Harrie Foreman  Exercises Seated Shoulder Row with Resistance Anchored at Feet - 1 x daily - 7 x weekly - 2 sets - 10 reps Seated Shoulder Extension with Resistance - 1 x daily - 7 x weekly - 2 sets - 10 reps Seated Shoulder Abduction with Resistance - 1 x daily - 7 x weekly - 2 sets - 10 reps Seated Shoulder Row with Anchored Resistance - 1 x daily - 7 x weekly - 2 sets - 10 reps Putty Squeezes - 1 x daily - 7 x weekly - 1 sets Seated Bilateral AROM Pectoralis Stretch - 1 x daily - 7 x weekly - 1 sets - 10 reps

## 2021-07-08 ENCOUNTER — Ambulatory Visit: Payer: Medicare HMO | Admitting: Family

## 2021-07-09 ENCOUNTER — Encounter: Payer: Self-pay | Admitting: Physical Therapy

## 2021-07-09 ENCOUNTER — Ambulatory Visit: Payer: Medicare HMO | Admitting: Physical Therapy

## 2021-07-09 ENCOUNTER — Other Ambulatory Visit: Payer: Self-pay

## 2021-07-09 DIAGNOSIS — G8929 Other chronic pain: Secondary | ICD-10-CM

## 2021-07-09 DIAGNOSIS — R252 Cramp and spasm: Secondary | ICD-10-CM

## 2021-07-09 DIAGNOSIS — M6281 Muscle weakness (generalized): Secondary | ICD-10-CM

## 2021-07-09 DIAGNOSIS — R2689 Other abnormalities of gait and mobility: Secondary | ICD-10-CM

## 2021-07-09 DIAGNOSIS — M5442 Lumbago with sciatica, left side: Secondary | ICD-10-CM | POA: Diagnosis not present

## 2021-07-09 NOTE — Therapy (Addendum)
[facility] [address]  [place], [place], [address] Phone: [phone]   Fax:  [phone]  Physical Therapy Treatment/Progress Note  Progress Note Reporting Period 06/03/2021 to 07/09/2021  See note below for Objective Data and Assessment of Progress/Goals.     Patient Details  Name: Brenda Mcfarland MRN: [id number] Date of Birth: Mar 24, 1946 Referring Provider (PT): Sherrie Mustache   Encounter Date: 07/09/2021   PT End of Session - 07/09/21 0947     Visit Number 10    Number of Visits 12    Date for PT Re-Evaluation 07/15/21    Authorization Type Medicare + State Health    Progress Note Due on Visit 10    PT Start Time 0932    PT Stop Time 1015    PT Time Calculation (min) 43 min    Activity Tolerance Patient tolerated treatment well    Behavior During Therapy [facility] for tasks assessed/performed             Past Medical History:  Diagnosis Date   Abnormal EKG 10/23/2016   Arthritis 01/18/2020   Chronic fatigue 10/08/2015   Depression 01/18/2020   Diabetes mellitus ([place]) 10/08/2015   Formatting of this note might be different from the original. Overview:  last Hgb A1C 7.2/no blood sugars at home Formatting of this note might be different from the original. last Hgb A1C 7.2/no blood sugars at home   ESR raised 11/27/2016   Frequent urination 12/16/2018   Glaucoma 10/08/2015   High cholesterol    History of pancreatitis 07/11/2014   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/23/2016   Hypertension 10/08/2015   IFG (impaired fasting glucose) 10/24/2019   Incomplete emptying of bladder 11/30/2017   Knee pain, left 12/22/2016   Loss of memory 01/18/2020   Major depressive disorder in partial remission ([place]) 10/08/2015   Morbid obesity ([place]) 10/23/2016   Obesity (BMI 30-39.9) 11/10/2016   Obstructive sleep apnea 12/16/2018   Osteoarthritis of both knees 11/27/2016   Other insomnia 03/05/2017   Palpitations 10/23/2016   Positive ANA (antinuclear  antibody) 10/23/2016   Sleep apnea 10/08/2015   Formatting of this note might be different from the original. Overview:  does not use CPAP Formatting of this note might be different from the original. does not use CPAP   Sore neck 10/24/2019   Spondylosis of cervical spine with myelopathy 01/18/2020   Type 2 diabetes mellitus without complication, without long-term current use of insulin ([place]) 12/16/2018   Vitamin D deficiency 10/08/2015    Past Surgical History:  Procedure Laterality Date   APPENDECTOMY     age 53   CESAREAN SECTION     X 3   NECK SURGERY  2015   SPINE SURGERY  05/29/2015    There were no vitals filed for this visit.   Subjective Assessment - 07/09/21 0938     Subjective Reports feeling "physically challenged" this morning, and having some knee pain.    Pertinent History chronic low back pain, neck pain, previous neck surgery, recent COVID infections, brain fog, memory impairments, T2DM, OA bil knees, glaucoma    Diagnostic tests x-rays with chiropracter    Patient Stated Goals be coordinated, stand up straight, walk around the block, work on balance    Currently in Pain? No/denies                [facility] PT Assessment - 07/09/21 0001       Assessment   Medical Diagnosis L  sided sciatica    Referring Provider (PT) Sherrie Mustache    Next MD Visit 07/11/2021      Balance   Balance Assessed Yes      Standardized Balance Assessment   Standardized Balance Assessment Dynamic Gait Index      Dynamic Gait Index   Level Surface Normal    Change in Gait Speed Normal    Gait with Horizontal Head Turns Normal    Gait with Vertical Head Turns Normal    Gait and Pivot Turn Normal    Step Over Obstacle Mild Impairment    Step Around Obstacles Mild Impairment    Steps Mild Impairment    Total Score 21                           [facility] Adult PT Treatment/Exercise - 07/09/21 0001       Self-Care   Self-Care Other Self-Care Comments    Other  Self-Care Comments  education on pain neuroscience      Exercises   Exercises Lumbar;Knee/Hip      Lumbar Exercises: Aerobic   Nustep L5 x 8 min      Lumbar Exercises: Seated   Other Seated Lumbar Exercises pelvic tilts and weightshifts on balance disk for core strengthening.      Manual Therapy   Manual Therapy Soft tissue mobilization;Myofascial release;Joint mobilization    Manual therapy comments to decrease muscle spasm and pain    Joint Mobilization L UPA mobs to lumbar spine grade 2-3    Soft tissue mobilization IASTM with foam roller to L gluts, QL, piriformis    Myofascial Release MFR to L QL                       PT Short Term Goals - 06/27/21 1156       PT SHORT TERM GOAL #1   Title Ind with initial HEP    Time 2    Period Weeks    Status Achieved   06/27/21   Target Date 06/17/21      PT SHORT TERM GOAL #2   Title Complete DGI/Berg to check for balance impairments.    Time 2    Period Weeks    Status Achieved   DGI 17/24   Target Date 06/17/21               PT Long Term Goals - 07/09/21 0948       PT LONG TERM GOAL #1   Title Ind with progressed HEP to improve outcomes.    Time 6    Period Weeks    Status On-going   06/17/21- progressed 10/25- progressed adding standing counter exercises.  07/09/21- met for current     PT LONG TERM GOAL #2   Title Improve walking tolerance to 1000' without limitation from back/leg pain    Baseline <50'    Time 6    Period Weeks    Status On-going   06/17/21- completed 5 min on treadmill  07/09/2021- able to walk 600' today before complaining of back/L hip pain     PT LONG TERM GOAL #3   Title Patient will improve score on DGI to >19/24 to decrease risk of falls.    Baseline 17/24 - high risk for falls.    Time 6    Period Weeks    Status Achieved   11/9- 21/24     PT LONG  TERM GOAL #4   Title Pt. will report 75% improvement in low back pain.    Time 6    Period Weeks    Status On-going    06/17/21- no report of pain today  06/24/21- no pain today.  11/9- reports overall improvement but still had bad days, 20% improvement                  Plan - 07/09/21 1025     Clinical Impression Statement Patient reporting having another off day today, and more difficulty with pain yesterday.  Today she complains about feeling "swimmy-headed."  She has f/u with her PCP on Friday.  She is making good progress towards all goals, reporting 20% improvement in L sciatica overall, and demonstrates decreased risk of falls demonstrated by DGI of 21/24 today (from 17/24), meeting LTG #3.  She was able to walk 600' feet today before reporting pain starting in L buttock, but after manual therapy reported no pain.  She would benefit from skilled physical therapy.    Personal Factors and Comorbidities Comorbidity 3+;Other    Comorbidities history neck pain/surgery, chronic LBP, recent COVID infection with brain fog, glaucoma, obesity, bil knee OA, T2DM.    PT Frequency 2x / week    PT Duration 6 weeks    PT Treatment/Interventions ADLs/Self Care Home Management;Cryotherapy;Electrical Stimulation;Iontophoresis 67m/ml Dexamethasone;Moist Heat;Traction;Ultrasound;Gait training;Stair training;Functional mobility training;Therapeutic activities;Therapeutic exercise;Balance training;Neuromuscular re-education;Patient/family education;Manual techniques;Passive range of motion;Dry needling;Taping;Joint Manipulations;Spinal Manipulations    PT Next Visit Plan LE strengthening for core/LE/balance, manual to low back, modalities PRN    PT Home Exercise Plan Access Code: [email or web address] Access Code: [email or web address](10/21)    Consulted and Agree with Plan of Care Patient             Patient will benefit from skilled therapeutic intervention in order to improve the following deficits and impairments:  Decreased activity tolerance, Decreased endurance, Decreased range of motion, Decreased strength, Increased fascial  restricitons, Improper body mechanics, Pain, Postural dysfunction, Impaired flexibility, Increased muscle spasms, Decreased balance, Decreased coordination, Decreased safety awareness  Visit Diagnosis: Chronic left-sided low back pain with left-sided sciatica  Other abnormalities of gait and mobility  Muscle weakness (generalized)  Cramp and spasm     Problem List Patient Active Problem List   Diagnosis Date Noted   Paroxysmal SVT (supraventricular tachycardia) ([place] 01/22/2020   Arthritis 01/18/2020   Depression 01/18/2020   Loss of memory 01/18/2020   Spondylosis of cervical spine with myelopathy 01/18/2020   IFG (impaired fasting glucose) 10/24/2019   Sore neck 10/24/2019   Frequent urination 12/16/2018   Obstructive sleep apnea 12/16/2018   Type 2 diabetes mellitus without complication, without long-term current use of insulin ([place] 12/16/2018   Incomplete emptying of bladder 11/30/2017   Other insomnia 03/05/2017   Knee pain, left 12/22/2016   ESR raised 11/27/2016   Osteoarthritis of both knees 11/27/2016   Obesity (BMI 30-39.9) 11/10/2016   Abnormal EKG 10/23/2016   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/23/2016   Morbid obesity ([place] 10/23/2016   Palpitations 10/23/2016   Positive ANA (antinuclear antibody) 10/23/2016   Hypertension 10/08/2015   Chronic fatigue 10/08/2015   Diabetes mellitus ([place] 10/08/2015   Glaucoma 10/08/2015   Major depressive disorder in partial remission ([place] 10/08/2015   Vitamin D deficiency 10/08/2015   Sleep apnea 10/08/2015   History of pancreatitis 07/11/2014    ERennie Natter PT, DPT 07/09/2021, 10:34 AM  [facility] [address]  [place], [place], [address] Phone: [phone]   Fax:  [phone]  Name: Yasheka Fossett MRN: [id number] Date of Birth: 15-Jul-1946

## 2021-07-11 ENCOUNTER — Other Ambulatory Visit: Payer: Self-pay

## 2021-07-11 ENCOUNTER — Encounter: Payer: Self-pay | Admitting: Nurse Practitioner

## 2021-07-11 ENCOUNTER — Ambulatory Visit (INDEPENDENT_AMBULATORY_CARE_PROVIDER_SITE_OTHER): Payer: Medicare HMO | Admitting: Nurse Practitioner

## 2021-07-11 VITALS — BP 130/80 | HR 73 | Temp 97.7°F | Ht 67.0 in | Wt 239.8 lb

## 2021-07-11 DIAGNOSIS — R69 Illness, unspecified: Secondary | ICD-10-CM | POA: Diagnosis not present

## 2021-07-11 DIAGNOSIS — N3281 Overactive bladder: Secondary | ICD-10-CM

## 2021-07-11 DIAGNOSIS — I471 Supraventricular tachycardia: Secondary | ICD-10-CM | POA: Diagnosis not present

## 2021-07-11 DIAGNOSIS — G4700 Insomnia, unspecified: Secondary | ICD-10-CM

## 2021-07-11 DIAGNOSIS — F325 Major depressive disorder, single episode, in full remission: Secondary | ICD-10-CM

## 2021-07-11 DIAGNOSIS — L989 Disorder of the skin and subcutaneous tissue, unspecified: Secondary | ICD-10-CM

## 2021-07-11 MED ORDER — SOLIFENACIN SUCCINATE 5 MG PO TABS: 5.0000 mg | ORAL_TABLET | Freq: Every day | ORAL | 1 refills | Status: DC

## 2021-07-11 MED ORDER — TRAZODONE HCL 100 MG PO TABS: 200.0000 mg | ORAL_TABLET | Freq: Every day | ORAL | 3 refills | Status: DC

## 2021-07-11 NOTE — Progress Notes (Signed)
Careteam: Patient Care Team: Lauree Chandler, NP as PCP - General (Geriatric Medicine) Berniece Salines, DO as PCP - Cardiology (Cardiology) Murlean Iba, MD as Referring Physician (Orthopedic Surgery) Park Liter, MD as Consulting Physician (Cardiology)  PLACE OF SERVICE:  [place] Directive information Does Patient Have a Medical Advance Directive?: Yes, Type of Advance Directive: [place];Living will, Does patient want to make changes to medical advance directive?: No - Patient declined  Allergies  Allergen Reactions   Amlodipine Swelling   Aripiprazole Other (See Comments)    Tongue twisted, lips curled up Tongue twisted, lips curled up    Carvedilol Other (See Comments)    Nightmares Nightmares    Paroxetine Hcl Other (See Comments)    . Marland Kitchen    Tizanidine Other (See Comments)    Syncope Syncope    Tramadol Other (See Comments)    Unknown   Zolpidem Other (See Comments)    Sleepwalking Sleepwalking    Zyrtec [Cetirizine Hcl]     Dizziness    Chief Complaint  Patient presents with   Medical Management of Chronic Issues    Routine follow up visit. Patient would like to area on forehead looked at. Discuss blood pressure medication and still having frequent urination.   Health Maintenance    Eye exam, foot exam, Hep c screening     HPI: Patient is a 75 y.o. female for routine follow up.  Pt with hx of chronic low back pain- going to PT which has made a big difference .  Htn- taking losartan 50 mg by mouth daily. Electronic cuff was up and off from manual.   Overactive bladder- myrbetriq was helping   Insomnia- continues on trazodone but having issues due to increase in urination at bedtime and having a hard time falling asleep. Using melatonin 5 mg at bedtime.   Lived her whole life in depression but does not have depression anymore.  Has lost 50 lbs but due to COVID and starting a new church group  which they eat well.    Review of Systems:  Review of Systems  Constitutional:  Negative for chills, fever and weight loss.  HENT:  Negative for tinnitus.   Respiratory:  Negative for cough, sputum production and shortness of breath.   Cardiovascular:  Negative for chest pain, palpitations and leg swelling.  Gastrointestinal:  Negative for abdominal pain, constipation, diarrhea and heartburn.  Genitourinary:  Positive for frequency. Negative for dysuria and urgency.  Musculoskeletal:  Negative for back pain, falls, joint pain and myalgias.  Skin: Negative.   Neurological:  Negative for dizziness and headaches.  Psychiatric/Behavioral:  Negative for depression and memory loss. The patient does not have insomnia.    Past Medical History:  Diagnosis Date   Abnormal EKG 10/23/2016   Arthritis 01/18/2020   Chronic fatigue 10/08/2015   Depression 01/18/2020   Diabetes mellitus ([place]) 10/08/2015   Formatting of this note might be different from the original. Overview:  last Hgb A1C 7.2/no blood sugars at home Formatting of this note might be different from the original. last Hgb A1C 7.2/no blood sugars at home   ESR raised 11/27/2016   Frequent urination 12/16/2018   Glaucoma 10/08/2015   High cholesterol    History of pancreatitis 07/11/2014   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/23/2016   Hypertension 10/08/2015   IFG (impaired fasting glucose) 10/24/2019   Incomplete emptying of bladder 11/30/2017   Knee pain, left 12/22/2016   Loss of  memory 01/18/2020   Major depressive disorder in partial remission ([place]) 10/08/2015   Morbid obesity ([place]) 10/23/2016   Obesity (BMI 30-39.9) 11/10/2016   Obstructive sleep apnea 12/16/2018   Osteoarthritis of both knees 11/27/2016   Other insomnia 03/05/2017   Palpitations 10/23/2016   Positive ANA (antinuclear antibody) 10/23/2016   Sleep apnea 10/08/2015   Formatting of this note might be different from the original. Overview:  does not use CPAP Formatting of this note might be different  from the original. does not use CPAP   Sore neck 10/24/2019   Spondylosis of cervical spine with myelopathy 01/18/2020   Type 2 diabetes mellitus without complication, without long-term current use of insulin ([place]) 12/16/2018   Vitamin D deficiency 10/08/2015   Past Surgical History:  Procedure Laterality Date   APPENDECTOMY     age 32   CESAREAN SECTION     X 3   NECK SURGERY  2015   SPINE SURGERY  05/29/2015   Social History:   reports that she has quit smoking. Her smoking use included cigarettes. She has a 10.00 pack-year smoking history. She has never used smokeless tobacco. She reports that she does not currently use alcohol. She reports that she does not currently use drugs.  Family History  Problem Relation Age of Onset   Diabetes Mother    Multiple sclerosis Mother    Heart disease Father    Heart Problems Father    Migraines Daughter    Thyroid disease Daughter    Migraines Daughter     Medications: Patient's Medications  New Prescriptions   No medications on file  Previous Medications   LATANOPROST (XALATAN) 0.005 % OPHTHALMIC SOLUTION    Place 1 drop into both eyes at bedtime.   LOSARTAN (COZAAR) 50 MG TABLET    Take 1 tablet by mouth daily.   MAGNESIUM GLYCINATE PO    Take 350 mg by mouth.   MELATONIN 5 MG TABS    Take by mouth at bedtime. 1-2 tablets   NYSTATIN CREAM (MYCOSTATIN)    Apply 1 application topically 2 (two) times daily.   OVER THE COUNTER MEDICATION    daily. Essential Oils   TRAZODONE (DESYREL) 150 MG TABLET    Take one tablet by mouth once daily at bedtime.  Modified Medications   No medications on file  Discontinued Medications   No medications on file    Physical Exam:  Vitals:   07/11/21 1317  BP: 130/80  Pulse: 73  Temp: 97.7 F (36.5 C)  SpO2: 97%  Weight: 239 lb 12.8 oz (108.8 kg)  Height: _0  (1.702 m)   Body mass index is 37.56 kg/m. Wt Readings from Last 3 Encounters:  07/11/21 239 lb 12.8 oz (108.8 kg)  05/02/21 234 lb  9.6 oz (106.4 kg)  03/12/21 232 lb 9.6 oz (105.5 kg)    Physical Exam Constitutional:      General: She is not in acute distress.    Appearance: She is well-developed. She is not diaphoretic.  HENT:     Head: Normocephalic and atraumatic.     Mouth/Throat:     Pharynx: No oropharyngeal exudate.  Eyes:     Conjunctiva/sclera: Conjunctivae normal.     Pupils: Pupils are equal, round, and reactive to light.  Cardiovascular:     Rate and Rhythm: Normal rate and regular rhythm.     Heart sounds: Normal heart sounds.  Pulmonary:     Effort: Pulmonary effort is normal.  Breath sounds: Normal breath sounds.  Abdominal:     General: Bowel sounds are normal.     Palpations: Abdomen is soft.  Musculoskeletal:     Cervical back: Normal range of motion and neck supple.     Right lower leg: No edema.     Left lower leg: No edema.  Skin:    General: Skin is warm and dry.  Neurological:     Mental Status: She is alert.  Psychiatric:        Mood and Affect: Mood normal.    Labs reviewed: Basic Metabolic Panel: Recent Labs    03/12/21 1601  NA 139  K 3.9  CL 103  CO2 26  GLUCOSE 126  BUN 19  CREATININE 0.74  CALCIUM 9.1   Liver Function Tests: Recent Labs    03/12/21 1601  AST 15  ALT 17  BILITOT 0.3  PROT 6.7   No results for input(s): LIPASE, AMYLASE in the last 8760 hours. No results for input(s): AMMONIA in the last 8760 hours. CBC: Recent Labs    03/12/21 1601  WBC 6.0  NEUTROABS 3,300  HGB 13.4  HCT 40.4  MCV 94.4  PLT 244   Lipid Panel: No results for input(s): CHOL, HDL, LDLCALC, TRIG, CHOLHDL, LDLDIRECT in the last 8760 hours. TSH: No results for input(s): TSH in the last 8760 hours. A1C: Lab Results  Component Value Date   HGBA1C 5.9 (H) 03/12/2021     Assessment/Plan 1. Overactive bladder -ongoing, could not afford myrbetriq, will start vesicare have her monitor for antichlorotic effects.    - Ambulatory referral to Physical Therapy -  solifenacin (VESICARE) 5 MG tablet; Take 1 tablet (5 mg total) by mouth daily.  Dispense: 30 tablet; Refill: 1  2. Major depression in remission ([place]) -in remission at this time.   3. Paroxysmal SVT (supraventricular tachycardia) (HCC) No recurrent episodes.   4. Morbid obesity ([place]) -education provided on healthy weight loss through increase in physical activity and proper nutrition   5. Insomnia, unspecified type -ongoing but improving. Tolerating trazodone well and would like to have an increase in dose to improve sleep.  - traZODone (DESYREL) 100 MG tablet; Take 2 tablets (200 mg total) by mouth at bedtime.  Dispense: 60 tablet; Refill: 3  6. Skin lesion -noted lesion to right forehead. - Ambulatory referral to Dermatology for further evaluation    Next appt: 4 months.  Carlos American. [place], [facility] [phone]

## 2021-07-11 NOTE — Patient Instructions (Signed)
Increase trazodone to 200 mg at bedtime for sleep.   Start vesicare 5 mg by mouth daily for overactive bladder  Continue to monitor blood pressure Low sodium diet.

## 2021-07-15 ENCOUNTER — Other Ambulatory Visit: Payer: Self-pay

## 2021-07-15 ENCOUNTER — Ambulatory Visit: Payer: Medicare HMO | Admitting: Physical Therapy

## 2021-07-15 ENCOUNTER — Encounter: Payer: Self-pay | Admitting: Physical Therapy

## 2021-07-15 DIAGNOSIS — M6281 Muscle weakness (generalized): Secondary | ICD-10-CM | POA: Diagnosis not present

## 2021-07-15 DIAGNOSIS — R252 Cramp and spasm: Secondary | ICD-10-CM

## 2021-07-15 DIAGNOSIS — M5442 Lumbago with sciatica, left side: Secondary | ICD-10-CM | POA: Diagnosis not present

## 2021-07-15 DIAGNOSIS — R2689 Other abnormalities of gait and mobility: Secondary | ICD-10-CM | POA: Diagnosis not present

## 2021-07-15 DIAGNOSIS — G8929 Other chronic pain: Secondary | ICD-10-CM

## 2021-07-15 NOTE — Addendum Note (Signed)
Addended by: Jena Gauss on: 07/15/2021 12:07 PM   Modules accepted: Orders

## 2021-07-15 NOTE — Therapy (Addendum)
[facility] [address]  [place], [place], [address] Phone: [phone]   Fax:  [phone]  Physical Therapy Treatment  Patient Details  Name: Brenda Mcfarland MRN: [id number] Date of Birth: 06-17-1946 Referring Provider (PT): Sherrie Mustache   Encounter Date: 07/15/2021   PT End of Session - 07/15/21 1111     Visit Number 11    Number of Visits 12    Date for PT Re-Evaluation 08/26/21    Authorization Type Medicare + State Health    Progress Note Due on Visit 10    PT Start Time 1103    PT Stop Time 1148    PT Time Calculation (min) 45 min    Activity Tolerance Patient tolerated treatment well    Behavior During Therapy [facility] for tasks assessed/performed             Past Medical History:  Diagnosis Date   Abnormal EKG 10/23/2016   Arthritis 01/18/2020   Chronic fatigue 10/08/2015   Depression 01/18/2020   Diabetes mellitus ([place]) 10/08/2015   Formatting of this note might be different from the original. Overview:  last Hgb A1C 7.2/no blood sugars at home Formatting of this note might be different from the original. last Hgb A1C 7.2/no blood sugars at home   ESR raised 11/27/2016   Frequent urination 12/16/2018   Glaucoma 10/08/2015   High cholesterol    History of pancreatitis 07/11/2014   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/23/2016   Hypertension 10/08/2015   IFG (impaired fasting glucose) 10/24/2019   Incomplete emptying of bladder 11/30/2017   Knee pain, left 12/22/2016   Loss of memory 01/18/2020   Major depressive disorder in partial remission ([place]) 10/08/2015   Morbid obesity ([place]) 10/23/2016   Obesity (BMI 30-39.9) 11/10/2016   Obstructive sleep apnea 12/16/2018   Osteoarthritis of both knees 11/27/2016   Other insomnia 03/05/2017   Palpitations 10/23/2016   Positive ANA (antinuclear antibody) 10/23/2016   Sleep apnea 10/08/2015   Formatting of this note might be different from the original. Overview:  does not use CPAP Formatting  of this note might be different from the original. does not use CPAP   Sore neck 10/24/2019   Spondylosis of cervical spine with myelopathy 01/18/2020   Type 2 diabetes mellitus without complication, without long-term current use of insulin ([place]) 12/16/2018   Vitamin D deficiency 10/08/2015    Past Surgical History:  Procedure Laterality Date   APPENDECTOMY     age 69   CESAREAN SECTION     X 3   NECK SURGERY  2015   SPINE SURGERY  05/29/2015    There were no vitals filed for this visit.   Subjective Assessment - 07/15/21 1105     Subjective Patient reports she bruised her foot badly saturday morning, having trouble getting shoe on.  Also reports increased pain on saturday.    Pertinent History chronic low back pain, neck pain, previous neck surgery, recent COVID infections, brain fog, memory impairments, T2DM, OA bil knees, glaucoma    Diagnostic tests x-rays with chiropracter    Patient Stated Goals be coordinated, stand up straight, walk around the block, work on balance    Currently in Pain? Yes    Pain Score 2     Pain Location Back    Pain Orientation Lower  [facility] Treatment/Exercise - 07/15/21 0001       Self-Care   Self-Care Other Self-Care Comments    Other Self-Care Comments  education on different items can try for thumb pain, try not to limp with foot pain as can increase back pain, what exercises should be performing.      Exercises   Exercises Lumbar;Knee/Hip      Lumbar Exercises: Stretches   Lower Trunk Rotation Limitations    Lower Trunk Rotation Limitations x10 full ROM      Lumbar Exercises: Aerobic   Nustep L5 x 8 min      Lumbar Exercises: Seated   Other Seated Lumbar Exercises pelvic tilts and weightshifts on balance disk for core strengthening.      Lumbar Exercises: Supine   Pelvic Tilt 10 reps      Manual Therapy   Manual Therapy Soft tissue mobilization;Myofascial release;Joint  mobilization    Manual therapy comments to decrease muscle spasm and pain    Joint Mobilization L UPA mobs to lumbar spine grade 2-3    Soft tissue mobilization IASTM with foam roller to bil gluts, QL, piriformis    Myofascial Release MFR to bil QL                       PT Short Term Goals - 06/27/21 1156       PT SHORT TERM GOAL #1   Title Ind with initial HEP    Time 2    Period Weeks    Status Achieved   06/27/21   Target Date 06/17/21      PT SHORT TERM GOAL #2   Title Complete DGI/Berg to check for balance impairments.    Time 2    Period Weeks    Status Achieved   DGI 17/24   Target Date 06/17/21               PT Long Term Goals - 07/15/21 1112       PT LONG TERM GOAL #1   Title Ind with progressed HEP to improve outcomes.    Time 6    Period Weeks    Status On-going   06/17/21- progressed 10/25- progressed adding standing counter exercises.  07/09/21- met for current   Target Date 08/26/21      PT LONG TERM GOAL #2   Title Improve walking tolerance to 1000' without limitation from back/leg pain    Baseline <50'    Time 6    Period Weeks    Status On-going   06/17/21- completed 5 min on treadmill  07/09/2021- able to walk 600' today before complaining of back/L hip pain  07/15/21- limited by foot pain today   Target Date 08/26/21      PT LONG TERM GOAL #3   Title Patient will improve score on DGI to >19/24 to decrease risk of falls.    Baseline 17/24 - high risk for falls.    Time 6    Period Weeks    Status Achieved   11/9- 21/24     PT LONG TERM GOAL #4   Title Pt. will report 75% improvement in low back pain.    Time 6    Period Weeks    Status On-going   06/17/21- no report of pain today  06/24/21- no pain today.  11/9- reports overall improvement but still had bad days, 20% improvement  07/15/21- still reports inconsistent progress.   Target Date 08/26/21  Plan - 07/15/21 1111     Clinical Impression  Statement Patient continues to report intermittant back/hip pain now both sides, not just L side, but overall pain is better.  Reports continued limitations with walking longer periods and shopping, but feels she is walking better and not limping.  Today limited by R foot bruising and pain, so focused on sitting and supine exercises.  She would benefit from continued physical therapy for additional 6 weeks 2x/week.    Personal Factors and Comorbidities Comorbidity 3+;Other    Comorbidities history neck pain/surgery, chronic LBP, recent COVID infection with brain fog, glaucoma, obesity, bil knee OA, T2DM.    PT Frequency 2x / week    PT Duration 6 weeks    PT Treatment/Interventions ADLs/Self Care Home Management;Cryotherapy;Electrical Stimulation;Iontophoresis 44m/ml Dexamethasone;Moist Heat;Traction;Ultrasound;Gait training;Stair training;Functional mobility training;Therapeutic activities;Therapeutic exercise;Balance training;Neuromuscular re-education;Patient/family education;Manual techniques;Passive range of motion;Dry needling;Taping;Joint Manipulations;Spinal Manipulations    PT Next Visit Plan LE strengthening for core/LE/balance, manual to low back, modalities PRN    PT Home Exercise Plan Access Code: [email or web address] Access Code: [email or web address](10/21)    Consulted and Agree with Plan of Care Patient             Patient will benefit from skilled therapeutic intervention in order to improve the following deficits and impairments:  Decreased activity tolerance, Decreased endurance, Decreased range of motion, Decreased strength, Increased fascial restricitons, Improper body mechanics, Pain, Postural dysfunction, Impaired flexibility, Increased muscle spasms, Decreased balance, Decreased coordination, Decreased safety awareness  Visit Diagnosis: Chronic left-sided low back pain with left-sided sciatica  Other abnormalities of gait and mobility  Muscle weakness (generalized)  Cramp and  spasm     Problem List Patient Active Problem List   Diagnosis Date Noted   Paroxysmal SVT (supraventricular tachycardia) ([place] 01/22/2020   Arthritis 01/18/2020   Depression 01/18/2020   Loss of memory 01/18/2020   Spondylosis of cervical spine with myelopathy 01/18/2020   IFG (impaired fasting glucose) 10/24/2019   Sore neck 10/24/2019   Frequent urination 12/16/2018   Obstructive sleep apnea 12/16/2018   Type 2 diabetes mellitus without complication, without long-term current use of insulin ([place] 12/16/2018   Incomplete emptying of bladder 11/30/2017   Other insomnia 03/05/2017   Knee pain, left 12/22/2016   ESR raised 11/27/2016   Osteoarthritis of both knees 11/27/2016   Obesity (BMI 30-39.9) 11/10/2016   Abnormal EKG 10/23/2016   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/23/2016   Morbid obesity ([place] 10/23/2016   Palpitations 10/23/2016   Positive ANA (antinuclear antibody) 10/23/2016   Hypertension 10/08/2015   Chronic fatigue 10/08/2015   Diabetes mellitus ([place] 10/08/2015   Glaucoma 10/08/2015   Major depressive disorder in partial remission ([place] 10/08/2015   Vitamin D deficiency 10/08/2015   Sleep apnea 10/08/2015   History of pancreatitis 07/11/2014    ERennie Natter PT, DPT 07/15/2021, 12:06 PM  [facility] [address] [place] [address]Phone: [phone]  Fax:  [phone] Name: CBrynlee PennywellMRN: [id number] of Birth: 511/16/1947

## 2021-07-28 ENCOUNTER — Encounter: Payer: Self-pay | Admitting: Orthopedic Surgery

## 2021-07-28 ENCOUNTER — Other Ambulatory Visit: Payer: Self-pay

## 2021-07-28 ENCOUNTER — Telehealth (INDEPENDENT_AMBULATORY_CARE_PROVIDER_SITE_OTHER): Payer: Medicare HMO | Admitting: Orthopedic Surgery

## 2021-07-28 ENCOUNTER — Encounter: Payer: Self-pay | Admitting: Nurse Practitioner

## 2021-07-28 DIAGNOSIS — J302 Other seasonal allergic rhinitis: Secondary | ICD-10-CM | POA: Diagnosis not present

## 2021-07-28 DIAGNOSIS — I10 Essential (primary) hypertension: Secondary | ICD-10-CM | POA: Diagnosis not present

## 2021-07-28 MED ORDER — IRBESARTAN 75 MG PO TABS
75.0000 mg | ORAL_TABLET | Freq: Every day | ORAL | 1 refills | Status: DC
Start: 2021-07-28 — End: 2021-08-21

## 2021-07-28 MED ORDER — LORATADINE 10 MG PO TABS: 10.0000 mg | ORAL_TABLET | Freq: Every day | ORAL | 11 refills | Status: DC

## 2021-07-28 NOTE — Progress Notes (Signed)
   This service is provided via telemedicine  No vital signs collected/recorded due to the encounter was a telemedicine visit.   Location of patient (ex: home, work):  Home  Patient consents to a telephone visit:  Yes  Location of the provider (ex: office, home):  [facility].   Name of any referring provider:  Eubanks, Jessica K, NP   Names of all persons participating in the telemedicine service and their role in the encounter:  Patient, Eliane Hammersmith, RMA, Amy Fargo, NP.    Time spent on call: 8 minutes spent on the phone with Medical Assistant.   

## 2021-07-28 NOTE — Patient Instructions (Signed)
Please purchase arm blood pressure cuff  Take blood pressure twice daily for 1-2 weeks (first blood pressure 2 hours after taking medication and in evening)  Follow low sodium diet < 2000 mg/daily  Schedule follow up at office- please bring new cuff and blood pressure readings

## 2021-07-28 NOTE — Progress Notes (Signed)
Careteam: Patient Care Team: Lauree Chandler, NP as PCP - General (Geriatric Medicine) Berniece Salines, DO as PCP - Cardiology (Cardiology) Murlean Iba, MD as Referring Physician (Orthopedic Surgery) Park Liter, MD as Consulting Physician (Cardiology)  Seen by: Windell Moulding, AGNP-C  PLACE OF SERVICE:  [place] Directive information Does Patient Have a Medical Advance Directive?: Yes, Type of Advance Directive: [place];Living will, Does patient want to make changes to medical advance directive?: No - Patient declined  Allergies  Allergen Reactions   Amlodipine Swelling   Aripiprazole Other (See Comments)    Tongue twisted, lips curled up Tongue twisted, lips curled up    Carvedilol Other (See Comments)    Nightmares Nightmares    Paroxetine Hcl Other (See Comments)    . Marland Kitchen    Tizanidine Other (See Comments)    Syncope Syncope    Tramadol Other (See Comments)    Unknown   Zolpidem Other (See Comments)    Sleepwalking Sleepwalking    Zyrtec [Cetirizine Hcl]     Dizziness    Chief Complaint  Patient presents with   Acute Visit    Patient c/o dizziness, vertigo, and being disoriented. Patient thinks these symptoms are related to losartan. Patient with a bad episode of vertigo on Saturday, felt fine yesterday (attended church), and today with another bad episode (although not as bad as Saturday). Patient has held losartan since Friday.  Patient was unable to be seen in person due to vertigo.      HPI: Patient is a 75 y.o. female seen today via telephone visit due to dizziness.   She started taking losartan about 2-3 weeks ago. She reports feeling dizziness and feeling "weird." 11/26 she also woke up with vertigo. She believes losartan is exacerbating her issues with vertigo. She did not take her blood pressure when dizziness occurred. She has stopped taking losartan at this time. She took her blood pressure  while on the phone with me, reports SBP > 180. Denies chest pain, sob, and blurred vision. Admits to adhering to low sodium diet. She has been eating dishes made from church and does not know sodium content. She plans to limit eating food prepared from others.   She is having more nasal congestion in the past week. She tried zyrtec and reports it made her dizzy. Requesting prescription for new medication.     Review of Systems:  Review of Systems  Constitutional:  Negative for chills, fever, malaise/fatigue and weight loss.  HENT:  Positive for congestion. Negative for tinnitus.        Seasonal allergies  Respiratory:  Negative for cough, shortness of breath and wheezing.   Cardiovascular:  Negative for chest pain and leg swelling.  Neurological:  Positive for dizziness. Negative for headaches.  Psychiatric/Behavioral:  Negative for depression. The patient is not nervous/anxious.    Past Medical History:  Diagnosis Date   Abnormal EKG 10/23/2016   Arthritis 01/18/2020   Chronic fatigue 10/08/2015   Depression 01/18/2020   Diabetes mellitus ([place]) 10/08/2015   Formatting of this note might be different from the original. Overview:  last Hgb A1C 7.2/no blood sugars at home Formatting of this note might be different from the original. last Hgb A1C 7.2/no blood sugars at home   ESR raised 11/27/2016   Frequent urination 12/16/2018   Glaucoma 10/08/2015   High cholesterol    History of pancreatitis 07/11/2014   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/23/2016   Hypertension  10/08/2015   IFG (impaired fasting glucose) 10/24/2019   Incomplete emptying of bladder 11/30/2017   Knee pain, left 12/22/2016   Loss of memory 01/18/2020   Major depressive disorder in partial remission ([place]) 10/08/2015   Morbid obesity ([place]) 10/23/2016   Obesity (BMI 30-39.9) 11/10/2016   Obstructive sleep apnea 12/16/2018   Osteoarthritis of both knees 11/27/2016   Other insomnia 03/05/2017   Palpitations 10/23/2016   Positive ANA (antinuclear antibody)  10/23/2016   Sleep apnea 10/08/2015   Formatting of this note might be different from the original. Overview:  does not use CPAP Formatting of this note might be different from the original. does not use CPAP   Sore neck 10/24/2019   Spondylosis of cervical spine with myelopathy 01/18/2020   Type 2 diabetes mellitus without complication, without long-term current use of insulin ([place]) 12/16/2018   Vitamin D deficiency 10/08/2015   Past Surgical History:  Procedure Laterality Date   APPENDECTOMY     age 7   CESAREAN SECTION     X [place]  2015   SPINE SURGERY  05/29/2015   Social History:   reports that she has quit smoking. Her smoking use included cigarettes. She has a 10.00 pack-year smoking history. She has never used smokeless tobacco. She reports that she does not currently use alcohol. She reports that she does not currently use drugs.  Family History  Problem Relation Age of Onset   Diabetes Mother    Multiple sclerosis Mother    Heart disease Father    Heart Problems Father    Migraines Daughter    Thyroid disease Daughter    Migraines Daughter     Medications: Patient's Medications  New Prescriptions   No medications on file  Previous Medications   LATANOPROST (XALATAN) 0.005 % OPHTHALMIC SOLUTION    Place 1 drop into both eyes at bedtime.   LOSARTAN (COZAAR) 50 MG TABLET    Take 1 tablet by mouth daily.   MAGNESIUM GLYCINATE PO    Take 350 mg by mouth.   MELATONIN 5 MG TABS    Take by mouth at bedtime. 1-2 tablets   OVER THE COUNTER MEDICATION    daily. Essential Oils   SOLIFENACIN (VESICARE) 5 MG TABLET    Take 1 tablet (5 mg total) by mouth daily.   TRAZODONE (DESYREL) 100 MG TABLET    Take 2 tablets (200 mg total) by mouth at bedtime.  Modified Medications   No medications on file  Discontinued Medications   No medications on file    Physical Exam:  There were no vitals filed for this visit. There is no height or weight on file to calculate BMI. Wt  Readings from Last 3 Encounters:  07/11/21 239 lb 12.8 oz (108.8 kg)  05/02/21 234 lb 9.6 oz (106.4 kg)  03/12/21 232 lb 9.6 oz (105.5 kg)    Physical Exam: unable to perform/ telephone encounter  Labs reviewed: Basic Metabolic Panel: Recent Labs    03/12/21 1601  NA 139  K 3.9  CL 103  CO2 26  GLUCOSE 126  BUN 19  CREATININE 0.74  CALCIUM 9.1   Liver Function Tests: Recent Labs    03/12/21 1601  AST 15  ALT 17  BILITOT 0.3  PROT 6.7   No results for input(s): LIPASE, AMYLASE in the last 8760 hours. No results for input(s): AMMONIA in the last 8760 hours. CBC: Recent Labs    03/12/21 1601  WBC 6.0  NEUTROABS 3,300  HGB 13.4  HCT 40.4  MCV 94.4  PLT 244   Lipid Panel: No results for input(s): CHOL, HDL, LDLCALC, TRIG, CHOLHDL, LDLDIRECT in the last 8760 hours. TSH: No results for input(s): TSH in the last 8760 hours. A1C: Lab Results  Component Value Date   HGBA1C 5.9 (H) 03/12/2021     Assessment/Plan 1. Primary hypertension - reports SBP > 180 - unable to tolerate losartan due to dizziness- no recorded blood pressures to confirm - advised to purchase arm blood pressure cuff - advised to take blood pressure 2 hours after taking medication and in evening x 1-2 weeks - advised to follow up with PCP in 1-2 weeks, bring new cuff and readings - follow low sodium diet < 2000 mg daily - irbesartan (AVAPRO) 75 MG tablet; Take 1 tablet (75 mg total) by mouth daily.  Dispense: 30 tablet; Refill: 1  2. Seasonal allergies - did not tolerate zyrtec well - continues to have nasal congestion - loratadine (CLARITIN) 10 MG tablet; Take 1 tablet (10 mg total) by mouth daily.  Dispense: 30 tablet; Refill: 11  Telephone Note   I connected with Brenda Mcfarland by telephone and verified that I am speaking with the correct person using two identifiers.   [place] Patient location: home Provider: Windell Moulding, AGNP-C Provider location: [facility]    I discussed the limitations, risks, security and privacy concerns of performing an evaluation and management service by telephone and the availability of in person appointments. I also discussed with the patient that there may be a patient responsible charge related to this service. The patient expressed understanding and agreed to proceed.    I discussed the assessment and treatment plan with the patient. The patient was provided an opportunity to ask questions and all were answered. The patient agreed with the plan and demonstrated an understanding of the instructions.     The patient was advised to call back or seek an in-person evaluation if the symptoms worsen or if the condition fails to improve as anticipated.   I provided 22 minutes of non-face-to-face time during this encounter.   Windell Moulding, AGNP-C Avs printed and mailed    Next appt: none [facility] [place], [facility] [phone]

## 2021-07-29 ENCOUNTER — Ambulatory Visit: Payer: Medicare HMO

## 2021-07-30 ENCOUNTER — Ambulatory Visit (INDEPENDENT_AMBULATORY_CARE_PROVIDER_SITE_OTHER): Payer: Medicare HMO | Admitting: Orthopedic Surgery

## 2021-07-30 ENCOUNTER — Other Ambulatory Visit: Payer: Self-pay

## 2021-07-30 ENCOUNTER — Encounter: Payer: Self-pay | Admitting: Orthopedic Surgery

## 2021-07-30 DIAGNOSIS — M79641 Pain in right hand: Secondary | ICD-10-CM | POA: Diagnosis not present

## 2021-07-30 DIAGNOSIS — M79671 Pain in right foot: Secondary | ICD-10-CM | POA: Diagnosis not present

## 2021-07-30 DIAGNOSIS — M79642 Pain in left hand: Secondary | ICD-10-CM

## 2021-07-30 DIAGNOSIS — I10 Essential (primary) hypertension: Secondary | ICD-10-CM | POA: Diagnosis not present

## 2021-07-30 NOTE — Progress Notes (Signed)
   This service is provided via telemedicine  No vital signs collected/recorded due to the encounter was a telemedicine visit.   Location of patient (ex: home, work):  Home  Patient consents to a telephone visit:  Yes  Location of the provider (ex: office, home):  [facility].   Name of any referring provider:  Eubanks, Jessica K, NP   Names of all persons participating in the telemedicine service and their role in the encounter:  Patient, Brenda Mcfarland, RMA, Amy Fargo, NP.    Time spent on call: 8 minutes spent on the phone with Medical Assistant.   

## 2021-07-30 NOTE — Progress Notes (Signed)
Careteam: Patient Care Team: Lauree Chandler, NP as PCP - General (Geriatric Medicine) Berniece Salines, DO as PCP - Cardiology (Cardiology) Murlean Iba, MD as Referring Physician (Orthopedic Surgery) Park Liter, MD as Consulting Physician (Cardiology)  Seen by: Windell Moulding, AGNP-C  PLACE OF SERVICE:  [place] Directive information Does Patient Have a Medical Advance Directive?: Yes, Type of Advance Directive: [place];Living will, Does patient want to make changes to medical advance directive?: No - Patient declined  Allergies  Allergen Reactions   Amlodipine Swelling   Aripiprazole Other (See Comments)    Tongue twisted, lips curled up Tongue twisted, lips curled up    Carvedilol Other (See Comments)    Nightmares Nightmares    Paroxetine Hcl Other (See Comments)    . Marland Kitchen    Tizanidine Other (See Comments)    Syncope Syncope    Tramadol Other (See Comments)    Unknown   Zolpidem Other (See Comments)    Sleepwalking Sleepwalking    Zyrtec [Cetirizine Hcl]     Dizziness    Chief Complaint  Patient presents with   Follow-up    Patient wants to discuss BP medications again and wants referral to hand specialist.      HPI: Patient is a 75 y.o. female seen today via telephone due to elevated blood pressure and foot/hand pain.   Started irbesartan yesterday. She also got new blood pressure cuff. Reports blood pressures 148/102 and evening pressure SBP > 160. Admits to feeling some anxiety when taking pressures. Also having intermittent pain in hands and right foot. Since her blood pressure was elevated, she took losartan this morning. Denies chest pain, blurred vision and headaches. We discussed sticking with one medication therapy at this time and taking pressures twice daily for one week.   Continues to feel dizziness throughout the day. She has started loratadine, does not think it has helped yet.   She  reports intermittent pain to her hands, specifically, thumbs. Describes family member bending thumb back a few years ago. She has seen a chiropractor in the past. Believes thumb pain worsened and now includes both thumbs. Pain described as stiff, stimulated with movement, described as mild.  Does not take anything for pain. Requesting to see orthopedic specialist.   Right foot pain has increased in the past few weeks. Denies injury or fall. Able to bear weight on right foot. Pain increased with movement. Pain rated as "mild." Requesting to see orthopedic specialist.     Review of Systems:  Review of Systems  Constitutional:  Negative for chills, fever, malaise/fatigue and weight loss.  HENT: Negative.    Respiratory:  Negative for cough, shortness of breath and wheezing.   Cardiovascular:  Negative for chest pain and leg swelling.  Musculoskeletal:  Negative for falls.       Bilateral hand pain, right foot pain  Skin: Negative.   Neurological:  Positive for dizziness. Negative for weakness and headaches.  Psychiatric/Behavioral:  Negative for depression. The patient is nervous/anxious. The patient does not have insomnia.    Past Medical History:  Diagnosis Date   Abnormal EKG 10/23/2016   Arthritis 01/18/2020   Chronic fatigue 10/08/2015   Depression 01/18/2020   Diabetes mellitus ([place]) 10/08/2015   Formatting of this note might be different from the original. Overview:  last Hgb A1C 7.2/no blood sugars at home Formatting of this note might be different from the original. last Hgb A1C 7.2/no blood sugars  at home   ESR raised 11/27/2016   Frequent urination 12/16/2018   Glaucoma 10/08/2015   High cholesterol    History of pancreatitis 07/11/2014   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/23/2016   Hypertension 10/08/2015   IFG (impaired fasting glucose) 10/24/2019   Incomplete emptying of bladder 11/30/2017   Knee pain, left 12/22/2016   Loss of memory 01/18/2020   Major depressive disorder in partial remission ([place])  10/08/2015   Morbid obesity ([place]) 10/23/2016   Obesity (BMI 30-39.9) 11/10/2016   Obstructive sleep apnea 12/16/2018   Osteoarthritis of both knees 11/27/2016   Other insomnia 03/05/2017   Palpitations 10/23/2016   Positive ANA (antinuclear antibody) 10/23/2016   Sleep apnea 10/08/2015   Formatting of this note might be different from the original. Overview:  does not use CPAP Formatting of this note might be different from the original. does not use CPAP   Sore neck 10/24/2019   Spondylosis of cervical spine with myelopathy 01/18/2020   Type 2 diabetes mellitus without complication, without long-term current use of insulin ([place]) 12/16/2018   Vitamin D deficiency 10/08/2015   Past Surgical History:  Procedure Laterality Date   APPENDECTOMY     age 17   CESAREAN SECTION     X [place]  2015   SPINE SURGERY  05/29/2015   Social History:   reports that she has quit smoking. Her smoking use included cigarettes. She has a 10.00 pack-year smoking history. She has never used smokeless tobacco. She reports that she does not currently use alcohol. She reports that she does not currently use drugs.  Family History  Problem Relation Age of Onset   Diabetes Mother    Multiple sclerosis Mother    Heart disease Father    Heart Problems Father    Migraines Daughter    Thyroid disease Daughter    Migraines Daughter     Medications: Patient's Medications  New Prescriptions   No medications on file  Previous Medications   IRBESARTAN (AVAPRO) 75 MG TABLET    Take 1 tablet (75 mg total) by mouth daily.   LATANOPROST (XALATAN) 0.005 % OPHTHALMIC SOLUTION    Place 1 drop into both eyes at bedtime.   LORATADINE (CLARITIN) 10 MG TABLET    Take 1 tablet (10 mg total) by mouth daily.   MAGNESIUM GLYCINATE PO    Take 350 mg by mouth.   MELATONIN 5 MG TABS    Take by mouth at bedtime. 1-2 tablets   OVER THE COUNTER MEDICATION    daily. Essential Oils   SOLIFENACIN (VESICARE) 5 MG TABLET    Take 1 tablet  (5 mg total) by mouth daily.   TRAZODONE (DESYREL) 100 MG TABLET    Take 2 tablets (200 mg total) by mouth at bedtime.  Modified Medications   No medications on file  Discontinued Medications   No medications on file    Physical Exam:  There were no vitals filed for this visit. There is no height or weight on file to calculate BMI. Wt Readings from Last 3 Encounters:  07/11/21 239 lb 12.8 oz (108.8 kg)  05/02/21 234 lb 9.6 oz (106.4 kg)  03/12/21 232 lb 9.6 oz (105.5 kg)    Physical Exam- unable to perform due to telephone encounter  Labs reviewed: Basic Metabolic Panel: Recent Labs    03/12/21 1601  NA 139  K 3.9  CL 103  CO2 26  GLUCOSE 126  BUN 19  CREATININE 0.74  CALCIUM  9.1   Liver Function Tests: Recent Labs    03/12/21 1601  AST 15  ALT 17  BILITOT 0.3  PROT 6.7   No results for input(s): LIPASE, AMYLASE in the last 8760 hours. No results for input(s): AMMONIA in the last 8760 hours. CBC: Recent Labs    03/12/21 1601  WBC 6.0  NEUTROABS 3,300  HGB 13.4  HCT 40.4  MCV 94.4  PLT 244   Lipid Panel: No results for input(s): CHOL, HDL, LDLCALC, TRIG, CHOLHDL, LDLDIRECT in the last 8760 hours. TSH: No results for input(s): TSH in the last 8760 hours. A1C: Lab Results  Component Value Date   HGBA1C 5.9 (H) 03/12/2021     Assessment/Plan 1. Primary hypertension - reports they are still elevated- suspect related to anxiety and pain issues - she has switched from losartan to irbesartan back to losartan - discussed sticking with one medication - cont to take bp twice daily - cont low sodium diet - advised to schedule f/u next week and bring bp records and cuff with her  2. Bilateral hand pain - ongoing, due to accident a few years ago - suspect arthritis - referral to orthopedics - may take tylenol 1000 mg po bid prn for pain  3. Right foot pain - ongoing, increased pain last 2-3 weeks - suspect arthritis - referral to orthopedics -  may use Voltaren gel 1%- apply to right foot tid prn for pain  Telephone Note   I connected with Brenda Mcfarland by telephone and verified that I am speaking with the correct person using two identifiers.   Patient: Brenda Mcfarland Patient location: home Provider: Windell Moulding, AGNP-C Provider location: [facility]    I discussed the limitations, risks, security and privacy concerns of performing an evaluation and management service by telephone and the availability of in person appointments. I also discussed with the patient that there may be a patient responsible charge related to this service. The patient expressed understanding and agreed to proceed.    I discussed the assessment and treatment plan with the patient. The patient was provided an opportunity to ask questions and all were answered. The patient agreed with the plan and demonstrated an understanding of the instructions.     The patient was advised to call back or seek an in-person evaluation if the symptoms worsen or if the condition fails to improve as anticipated.   I provided 13 minutes of non-face-to-face time during this encounter.   Windell Moulding, AGNP-C Avs printed and mailed    Next appt: 08/07/2021  Niel Hummer  [facility] [phone]

## 2021-07-30 NOTE — Patient Instructions (Signed)
Referral to orthopedics made- called "[facility]"- they will call within next week to schedule  May take tylenol 1000 mg twice daily as needed for pain  May try Voltaren Gel 1%- apply to hand and right foot, three times daily as needed for pain

## 2021-07-31 ENCOUNTER — Ambulatory Visit: Payer: Medicare HMO | Admitting: Physical Therapy

## 2021-07-31 ENCOUNTER — Telehealth: Payer: Medicare HMO | Admitting: Nurse Practitioner

## 2021-08-04 ENCOUNTER — Other Ambulatory Visit: Payer: Self-pay | Admitting: *Deleted

## 2021-08-04 DIAGNOSIS — G4700 Insomnia, unspecified: Secondary | ICD-10-CM

## 2021-08-04 MED ORDER — TRAZODONE HCL 100 MG PO TABS: 200.0000 mg | ORAL_TABLET | Freq: Every day | ORAL | 5 refills | Status: DC

## 2021-08-04 NOTE — Telephone Encounter (Signed)
[facility] requested refill.  ?Pended Rx and sent to Jessica for approval due to HIGH ALERT Warning.  ?

## 2021-08-05 ENCOUNTER — Telehealth (INDEPENDENT_AMBULATORY_CARE_PROVIDER_SITE_OTHER): Payer: Medicare HMO | Admitting: Nurse Practitioner

## 2021-08-05 ENCOUNTER — Other Ambulatory Visit: Payer: Self-pay

## 2021-08-05 ENCOUNTER — Ambulatory Visit: Payer: Medicare HMO | Attending: Nurse Practitioner

## 2021-08-05 ENCOUNTER — Ambulatory Visit (INDEPENDENT_AMBULATORY_CARE_PROVIDER_SITE_OTHER): Payer: Medicare HMO | Admitting: Orthopaedic Surgery

## 2021-08-05 ENCOUNTER — Ambulatory Visit (INDEPENDENT_AMBULATORY_CARE_PROVIDER_SITE_OTHER): Payer: Medicare HMO

## 2021-08-05 ENCOUNTER — Ambulatory Visit: Payer: Self-pay

## 2021-08-05 DIAGNOSIS — R2689 Other abnormalities of gait and mobility: Secondary | ICD-10-CM | POA: Diagnosis not present

## 2021-08-05 DIAGNOSIS — G8929 Other chronic pain: Secondary | ICD-10-CM | POA: Insufficient documentation

## 2021-08-05 DIAGNOSIS — M79642 Pain in left hand: Secondary | ICD-10-CM

## 2021-08-05 DIAGNOSIS — M19041 Primary osteoarthritis, right hand: Secondary | ICD-10-CM

## 2021-08-05 DIAGNOSIS — N3281 Overactive bladder: Secondary | ICD-10-CM

## 2021-08-05 DIAGNOSIS — M1812 Unilateral primary osteoarthritis of first carpometacarpal joint, left hand: Secondary | ICD-10-CM

## 2021-08-05 DIAGNOSIS — I10 Essential (primary) hypertension: Secondary | ICD-10-CM | POA: Diagnosis not present

## 2021-08-05 DIAGNOSIS — R252 Cramp and spasm: Secondary | ICD-10-CM | POA: Insufficient documentation

## 2021-08-05 DIAGNOSIS — R42 Dizziness and giddiness: Secondary | ICD-10-CM

## 2021-08-05 DIAGNOSIS — M65311 Trigger thumb, right thumb: Secondary | ICD-10-CM | POA: Diagnosis not present

## 2021-08-05 DIAGNOSIS — M79671 Pain in right foot: Secondary | ICD-10-CM

## 2021-08-05 DIAGNOSIS — M5442 Lumbago with sciatica, left side: Secondary | ICD-10-CM | POA: Insufficient documentation

## 2021-08-05 DIAGNOSIS — M6281 Muscle weakness (generalized): Secondary | ICD-10-CM | POA: Diagnosis not present

## 2021-08-05 DIAGNOSIS — M1811 Unilateral primary osteoarthritis of first carpometacarpal joint, right hand: Secondary | ICD-10-CM | POA: Diagnosis not present

## 2021-08-05 MED ORDER — LIDOCAINE HCL 1 % IJ SOLN
0.3000 mL | INTRAMUSCULAR | Status: AC | PRN
  Administered 2021-08-05: .3 mL

## 2021-08-05 MED ORDER — BUPIVACAINE HCL 0.5 % IJ SOLN
0.3300 mL | INTRAMUSCULAR | Status: AC | PRN
Start: 2021-08-05 — End: 2021-08-05
  Administered 2021-08-05: .33 mL

## 2021-08-05 MED ORDER — METHYLPREDNISOLONE ACETATE 40 MG/ML IJ SUSP
13.3300 mg | INTRAMUSCULAR | Status: AC | PRN
Start: 2021-08-05 — End: 2021-08-05
  Administered 2021-08-05: 13.33 mg

## 2021-08-05 NOTE — Progress Notes (Signed)
This service is provided via telemedicine  No vital signs collected/recorded due to the encounter was a telemedicine visit.   Location of patient (ex: home, work):  [place]  Patient consents to a telephone visit:  Yes, see encounter dated 03/27/2021  Location of the provider (ex: office, home):  [employer]  Name of any referring provider:  N/A  Names of all persons participating in the telemedicine service and their role in the encounter:  Abbey Chatters, Nurse Practitioner, Elveria Royals, CMA, and patient.   Time spent on call:  9 minutes with medical assistant

## 2021-08-05 NOTE — Telephone Encounter (Signed)
This encounter was created in error - please disregard.

## 2021-08-05 NOTE — Progress Notes (Signed)
Office Visit Note   Patient: Brenda Mcfarland           Date of Birth: [birth year]           MRN: [id number] Visit Date: 08/05/2021              Requested by: Yvonna Alanis, NP [phone],  [place] [address] PCP: Lauree Chandler, NP   Assessment & Plan: Visit Diagnoses:  1. Primary osteoarthritis of first carpometacarpal joint of right hand   2. Pain in right foot   3. Primary osteoarthritis of first carpometacarpal joint of left hand   4. Trigger thumb, right thumb     Plan: Impression is bilateral thumb CMC arthritis, right trigger thumb, right fifth toe fracture.  Braces were provided today for the [facility] arthritis.  We did perform a trigger thumb injection.  We talked about the importance of backing off on activity.  We will treat the toe fracture conservatively.  She is doing fine with normal shoewear.  Follow-Up Instructions: No follow-ups on file.   Orders:  Orders Placed This Encounter  Procedures   XR Hand Complete Left   XR Hand Complete Right   XR Foot Complete Right   No orders of the defined types were placed in this encounter.     Procedures: Hand/UE Inj: R thumb A1 for trigger finger on 08/05/2021 2:59 PM Indications: pain Details: 25 G needle Medications: 0.3 mL lidocaine 1 %; 0.33 mL bupivacaine 0.5 %; 13.33 mg methylPREDNISolone acetate 40 MG/ML Outcome: tolerated well, no immediate complications Consent was given by the patient. Patient was prepped and draped in the usual sterile fashion.      Clinical Data: No additional findings.   Subjective: Chief Complaint  Patient presents with   Right Foot - Pain   Right Hand - Pain   Left Hand - Pain    Brenda Mcfarland is a 75 year old female who comes in for evaluation of multiple problems.  She endorses bilateral hand pain and a right thumb that pops and catches a lot.  She also stubbed her right fifth toe 3 weeks ago which is still painful and swollen.  Denies any numbness and tingling or night  pain in her hands or fingers.  She does a lot of arts and crafts which exacerbates the hand symptoms.   Review of Systems  Constitutional: Negative.   HENT: Negative.    Eyes: Negative.   Respiratory: Negative.    Cardiovascular: Negative.   Endocrine: Negative.   Musculoskeletal: Negative.   Neurological: Negative.   Hematological: Negative.   Psychiatric/Behavioral: Negative.    All other systems reviewed and are negative.   Objective: Vital Signs: There were no vitals taken for this visit.  Physical Exam Vitals and nursing note reviewed.  Constitutional:      Appearance: She is well-developed.  Pulmonary:     Effort: Pulmonary effort is normal.  Skin:    General: Skin is warm.     Capillary Refill: Capillary refill takes less than 2 seconds.  Neurological:     Mental Status: She is alert and oriented to person, place, and time.  Psychiatric:        Behavior: Behavior normal.        Thought Content: Thought content normal.        Judgment: Judgment normal.    Ortho Exam  Bilateral hands show pain and crepitus with grind test of the thumb.  Negative Finkelstein's.  She has tenderness to  the A1 pulley of the right thumb.  Equivocal carpal tunnel compressive signs.  No muscle atrophy of the hands.  Normal capillary refill.  Right fifth toe shows moderate swelling.  Normal capillary refill.  Tenderness to the proximal phalanx.  Specialty Comments:  No specialty comments available.  Imaging: No results found.   PMFS History: Patient Active Problem List   Diagnosis Date Noted   Paroxysmal SVT (supraventricular tachycardia) ([place]) 01/22/2020   Arthritis 01/18/2020   Depression 01/18/2020   Loss of memory 01/18/2020   Spondylosis of cervical spine with myelopathy 01/18/2020   IFG (impaired fasting glucose) 10/24/2019   Sore neck 10/24/2019   Frequent urination 12/16/2018   Obstructive sleep apnea 12/16/2018   Type 2 diabetes mellitus without complication,  without long-term current use of insulin ([place]) 12/16/2018   Incomplete emptying of bladder 11/30/2017   Other insomnia 03/05/2017   Knee pain, left 12/22/2016   ESR raised 11/27/2016   Osteoarthritis of both knees 11/27/2016   Obesity (BMI 30-39.9) 11/10/2016   Abnormal EKG 10/23/2016   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/23/2016   Morbid obesity ([place]) 10/23/2016   Palpitations 10/23/2016   Positive ANA (antinuclear antibody) 10/23/2016   Hypertension 10/08/2015   Chronic fatigue 10/08/2015   Diabetes mellitus ([place]) 10/08/2015   Glaucoma 10/08/2015   Major depressive disorder in partial remission ([place]) 10/08/2015   Vitamin D deficiency 10/08/2015   Sleep apnea 10/08/2015   History of pancreatitis 07/11/2014   Past Medical History:  Diagnosis Date   Abnormal EKG 10/23/2016   Arthritis 01/18/2020   Chronic fatigue 10/08/2015   Depression 01/18/2020   Diabetes mellitus ([place]) 10/08/2015   Formatting of this note might be different from the original. Overview:  last Hgb A1C 7.2/no blood sugars at home Formatting of this note might be different from the original. last Hgb A1C 7.2/no blood sugars at home   ESR raised 11/27/2016   Frequent urination 12/16/2018   Glaucoma 10/08/2015   High cholesterol    History of pancreatitis 07/11/2014   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/23/2016   Hypertension 10/08/2015   IFG (impaired fasting glucose) 10/24/2019   Incomplete emptying of bladder 11/30/2017   Knee pain, left 12/22/2016   Loss of memory 01/18/2020   Major depressive disorder in partial remission ([place]) 10/08/2015   Morbid obesity ([place]) 10/23/2016   Obesity (BMI 30-39.9) 11/10/2016   Obstructive sleep apnea 12/16/2018   Osteoarthritis of both knees 11/27/2016   Other insomnia 03/05/2017   Palpitations 10/23/2016   Positive ANA (antinuclear antibody) 10/23/2016   Sleep apnea 10/08/2015   Formatting of this note might be different from the original. Overview:  does not use CPAP Formatting of this note might be different from the  original. does not use CPAP   Sore neck 10/24/2019   Spondylosis of cervical spine with myelopathy 01/18/2020   Type 2 diabetes mellitus without complication, without long-term current use of insulin ([place]) 12/16/2018   Vitamin D deficiency 10/08/2015    Family History  Problem Relation Age of Onset   Diabetes Mother    Multiple sclerosis Mother    Heart disease Father    Heart Problems Father    Migraines Daughter    Thyroid disease Daughter    Migraines Daughter     Past Surgical History:  Procedure Laterality Date   APPENDECTOMY     age 33   CESAREAN SECTION     X [place]  2015   [place] SURGERY  05/29/2015   Social History  Occupational History   Not on file  Tobacco Use   Smoking status: Former    Packs/day: 1.00    Years: 10.00    Pack years: 10.00    Types: Cigarettes   Smokeless tobacco: Never  Vaping Use   Vaping Use: Never used  Substance and Sexual Activity   Alcohol use: Not Currently   Drug use: Not Currently   Sexual activity: Not on file

## 2021-08-05 NOTE — Patient Instructions (Signed)
Access Code: [email or web address] URL: [email or web address] Date: 08/05/2021 Prepared by: Verta Ellen  Exercises Seated Hamstring Stretch - 2 x daily - 7 x weekly - 2 sets - 2 reps - 30 hold Standing March with Counter Support - 1 x daily - 7 x weekly - 3 sets - 10 reps Hip External Rotation Stretch - 1 x daily - 7 x weekly - 3 sets - 10 reps

## 2021-08-05 NOTE — Therapy (Signed)
[facility] [address]  [place], [place], [address] Phone: [phone]   Fax:  [phone]  Physical Therapy Treatment  Patient Details  Name: Brenda Mcfarland MRN: [id number] Date of Birth: 29-Oct-1945 Referring Provider (PT): Sherrie Mustache   Encounter Date: 08/05/2021   PT End of Session - 08/05/21 1156     Visit Number 12    Number of Visits 20    Date for PT Re-Evaluation 08/26/21    Authorization Type Medicare + State Health    PT Start Time 1104    PT Stop Time 1146    PT Time Calculation (min) 42 min    Activity Tolerance Patient tolerated treatment well    Behavior During Therapy [facility] for tasks assessed/performed             Past Medical History:  Diagnosis Date   Abnormal EKG 10/23/2016   Arthritis 01/18/2020   Chronic fatigue 10/08/2015   Depression 01/18/2020   Diabetes mellitus ([place]) 10/08/2015   Formatting of this note might be different from the original. Overview:  last Hgb A1C 7.2/no blood sugars at home Formatting of this note might be different from the original. last Hgb A1C 7.2/no blood sugars at home   ESR raised 11/27/2016   Frequent urination 12/16/2018   Glaucoma 10/08/2015   High cholesterol    History of pancreatitis 07/11/2014   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/23/2016   Hypertension 10/08/2015   IFG (impaired fasting glucose) 10/24/2019   Incomplete emptying of bladder 11/30/2017   Knee pain, left 12/22/2016   Loss of memory 01/18/2020   Major depressive disorder in partial remission ([place]) 10/08/2015   Morbid obesity ([place]) 10/23/2016   Obesity (BMI 30-39.9) 11/10/2016   Obstructive sleep apnea 12/16/2018   Osteoarthritis of both knees 11/27/2016   Other insomnia 03/05/2017   Palpitations 10/23/2016   Positive ANA (antinuclear antibody) 10/23/2016   Sleep apnea 10/08/2015   Formatting of this note might be different from the original. Overview:  does not use CPAP Formatting of this note might be different  from the original. does not use CPAP   Sore neck 10/24/2019   Spondylosis of cervical spine with myelopathy 01/18/2020   Type 2 diabetes mellitus without complication, without long-term current use of insulin ([place]) 12/16/2018   Vitamin D deficiency 10/08/2015    Past Surgical History:  Procedure Laterality Date   APPENDECTOMY     age 26   CESAREAN SECTION     X 3   NECK SURGERY  2015   SPINE SURGERY  05/29/2015    There were no vitals filed for this visit.   Subjective Assessment - 08/05/21 1109     Subjective Pt reports that she has had vertigo for a week, going to see orthopedist for her hands.    Pertinent History chronic low back pain, neck pain, previous neck surgery, recent COVID infections, brain fog, memory impairments, T2DM, OA bil knees, glaucoma    Diagnostic tests x-rays with chiropracter    Patient Stated Goals be coordinated, stand up straight, walk around the block, work on balance    Currently in Pain? Yes    Pain Score 6     Pain Location Hip    Pain Orientation Lower                               [facility] Adult PT Treatment/Exercise - 08/05/21 0001  Exercises   Exercises Lumbar      Lumbar Exercises: Stretches   Piriformis Stretch Left;Right;30 seconds    Piriformis Stretch Limitations seated hip hinge with leg supported on mat      Lumbar Exercises: Aerobic   Nustep [id number]      Lumbar Exercises: Standing   Row Strengthening;10 reps;Theraband    Theraband Level (Row) Level 3 (Green)    Shoulder Extension Strengthening;Both;10 reps;Theraband    Theraband Level (Shoulder Extension) Level 3 (Green)      Manual Therapy   Manual Therapy Soft tissue mobilization    Soft tissue mobilization to glutes, piriformis                     PT Education - 08/05/21 1156     Education Details HEP progression- ER stretch    Person(s) Educated Patient    Methods Explanation;Demonstration;Verbal cues;Handout    Comprehension  Verbalized understanding;Returned demonstration;Verbal cues required              PT Short Term Goals - 06/27/21 1156       PT SHORT TERM GOAL #1   Title Ind with initial HEP    Time 2    Period Weeks    Status Achieved   06/27/21   Target Date 06/17/21      PT SHORT TERM GOAL #2   Title Complete DGI/Berg to check for balance impairments.    Time 2    Period Weeks    Status Achieved   DGI 17/24   Target Date 06/17/21               PT Long Term Goals - 08/05/21 1141       PT LONG TERM GOAL #1   Title Ind with progressed HEP to improve outcomes.    Time 6    Period Weeks    Status On-going   06/17/21- progressed 10/25- progressed adding standing counter exercises.  07/09/21- met for current   Target Date 08/26/21      PT LONG TERM GOAL #2   Title Improve walking tolerance to 1000' without limitation from back/leg pain    Baseline <50'    Time 6    Period Weeks    Status On-going   06/17/21- completed 5 min on treadmill  07/09/2021- able to walk 600' today before complaining of back/L hip pain  07/15/21- limited by foot pain today   Target Date 08/26/21      PT LONG TERM GOAL #3   Title Patient will improve score on DGI to >19/24 to decrease risk of falls.    Baseline 17/24 - high risk for falls.    Time 6    Period Weeks    Status Achieved   11/9- 21/24     PT LONG TERM GOAL #4   Title Pt. will report 75% improvement in low back pain.    Time 6    Period Weeks    Status On-going   08/05/21- 60% improvement   Target Date 08/26/21                   Plan - 08/05/21 1208     Clinical Impression Statement Majority of session was spent working on relieving tightness in the L glute area. After manual we reviewed alternate version of piriformis stretch for pt comfort. Updated HEP with green TB for UE exercises, she needed cues to prevent bicep compensation with rows. She noted having vertigo for  the past week and was noted that we would need an MD  referral to address this issue. Otherwise session was tolerated well with no increased pain.    Personal Factors and Comorbidities Comorbidity 3+;Other    Comorbidities history neck pain/surgery, chronic LBP, recent COVID infection with brain fog, glaucoma, obesity, bil knee OA, T2DM.    PT Frequency 2x / week    PT Duration 6 weeks    PT Treatment/Interventions ADLs/Self Care Home Management;Cryotherapy;Electrical Stimulation;Iontophoresis 52m/ml Dexamethasone;Moist Heat;Traction;Ultrasound;Gait training;Stair training;Functional mobility training;Therapeutic activities;Therapeutic exercise;Balance training;Neuromuscular re-education;Patient/family education;Manual techniques;Passive range of motion;Dry needling;Taping;Joint Manipulations;Spinal Manipulations    PT Next Visit Plan LE strengthening for core/LE/balance, manual to low back, modalities PRN    PT Home Exercise Plan Access Code: [email or web address] Access Code: [email or web address](10/21)    Consulted and Agree with Plan of Care Patient             Patient will benefit from skilled therapeutic intervention in order to improve the following deficits and impairments:  Decreased activity tolerance, Decreased endurance, Decreased range of motion, Decreased strength, Increased fascial restricitons, Improper body mechanics, Pain, Postural dysfunction, Impaired flexibility, Increased muscle spasms, Decreased balance, Decreased coordination, Decreased safety awareness  Visit Diagnosis: Chronic left-sided low back pain with left-sided sciatica  Other abnormalities of gait and mobility  Muscle weakness (generalized)  Cramp and spasm     Problem List Patient Active Problem List   Diagnosis Date Noted   Paroxysmal SVT (supraventricular tachycardia) ([place] 01/22/2020   Arthritis 01/18/2020   Depression 01/18/2020   Loss of memory 01/18/2020   Spondylosis of cervical spine with myelopathy 01/18/2020   IFG (impaired fasting glucose) 10/24/2019   Sore  neck 10/24/2019   Frequent urination 12/16/2018   Obstructive sleep apnea 12/16/2018   Type 2 diabetes mellitus without complication, without long-term current use of insulin ([place] 12/16/2018   Incomplete emptying of bladder 11/30/2017   Other insomnia 03/05/2017   Knee pain, left 12/22/2016   ESR raised 11/27/2016   Osteoarthritis of both knees 11/27/2016   Obesity (BMI 30-39.9) 11/10/2016   Abnormal EKG 10/23/2016   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/23/2016   Morbid obesity ([place] 10/23/2016   Palpitations 10/23/2016   Positive ANA (antinuclear antibody) 10/23/2016   Hypertension 10/08/2015   Chronic fatigue 10/08/2015   Diabetes mellitus ([place] 10/08/2015   Glaucoma 10/08/2015   Major depressive disorder in partial remission ([place] 10/08/2015   Vitamin D deficiency 10/08/2015   Sleep apnea 10/08/2015   History of pancreatitis 07/11/2014    BArtist Pais PTA 08/05/2021, 12:17 PM  [facility] [address] [place] [address]Phone: [phone]  Fax:  [phone] Name: CDoneisha IveyMRN: [id number] of Birth: [birth year]

## 2021-08-05 NOTE — Progress Notes (Signed)
Careteam: Patient Care Team: Lauree Chandler, NP as PCP - General (Geriatric Medicine) Berniece Salines, DO as PCP - Cardiology (Cardiology) Murlean Iba, MD as Referring Physician (Orthopedic Surgery) Park Liter, MD as Consulting Physician (Cardiology)  Advanced Directive information    Allergies  Allergen Reactions   Amlodipine Swelling   Aripiprazole Other (See Comments)    Tongue twisted, lips curled up Tongue twisted, lips curled up    Carvedilol Other (See Comments)    Nightmares Nightmares    Paroxetine Hcl Other (See Comments)    . Marland Kitchen    Tizanidine Other (See Comments)    Syncope Syncope    Tramadol Other (See Comments)    Unknown   Zolpidem Other (See Comments)    Sleepwalking Sleepwalking    Zyrtec [Cetirizine Hcl]     Dizziness    Chief Complaint  Patient presents with   Acute Visit    Patient complains of vertigo for about a week. Patient states that she is very weak. Patient went to PT today, needs referral specifically for vertigo exercises.Patient is feeling a little better today. Patient has some concerns about poor circulation.Patient has cold feet, brain fog and exhaustion. Patient having trouble with blood pressure and dizziness.     HPI: Patient is a 75 y.o. female via telephone visit- unable to do virtual visit. She has been at orthopedic doctor most of the day.  She got a new cuff bp 149/95 Her blood pressure medication was changed to irbesartan last week and since she is having worsening vertigo.  She took blood pressure medication last night and has not been dizzy today.  Dizziness is effecting her thinking and feels foggy headed.  She had PT this morning and been moving around a lot and not having dizziness.  She has hx of vertigo and would like referral for vestibular rehab. Due to the persistent dizziness. Would like exercise to help with this since it has been so debilitating to her.     OAB- taking  vesicare every night, working very well.  Price is very affordable.  Not having any side effects with that.  Getting 4-6 hours at night now.   OA hand- got cortisone shot in her hand today.   Review of Systems:  Review of Systems  Constitutional:  Negative for chills, fever and weight loss.  HENT:  Negative for tinnitus.   Respiratory:  Negative for cough, sputum production and shortness of breath.   Cardiovascular:  Negative for chest pain, palpitations and leg swelling.  Gastrointestinal:  Negative for abdominal pain, constipation, diarrhea and heartburn.  Genitourinary:  Positive for frequency. Negative for dysuria and urgency.  Musculoskeletal:  Positive for joint pain and myalgias. Negative for back pain and falls.  Skin: Negative.   Neurological:  Positive for dizziness. Negative for headaches.  Psychiatric/Behavioral:  Negative for depression and memory loss. The patient does not have insomnia.    Past Medical History:  Diagnosis Date   Abnormal EKG 10/23/2016   Arthritis 01/18/2020   Chronic fatigue 10/08/2015   Depression 01/18/2020   Diabetes mellitus ([place]) 10/08/2015   Formatting of this note might be different from the original. Overview:  last Hgb A1C 7.2/no blood sugars at home Formatting of this note might be different from the original. last Hgb A1C 7.2/no blood sugars at home   ESR raised 11/27/2016   Frequent urination 12/16/2018   Glaucoma 10/08/2015   High cholesterol    History of pancreatitis 07/11/2014  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/23/2016   Hypertension 10/08/2015   IFG (impaired fasting glucose) 10/24/2019   Incomplete emptying of bladder 11/30/2017   Knee pain, left 12/22/2016   Loss of memory 01/18/2020   Major depressive disorder in partial remission ([place]) 10/08/2015   Morbid obesity ([place]) 10/23/2016   Obesity (BMI 30-39.9) 11/10/2016   Obstructive sleep apnea 12/16/2018   Osteoarthritis of both knees 11/27/2016   Other insomnia 03/05/2017   Palpitations 10/23/2016   Positive  ANA (antinuclear antibody) 10/23/2016   Sleep apnea 10/08/2015   Formatting of this note might be different from the original. Overview:  does not use CPAP Formatting of this note might be different from the original. does not use CPAP   Sore neck 10/24/2019   Spondylosis of cervical spine with myelopathy 01/18/2020   Type 2 diabetes mellitus without complication, without long-term current use of insulin ([place]) 12/16/2018   Vitamin D deficiency 10/08/2015   Past Surgical History:  Procedure Laterality Date   APPENDECTOMY     age 24   CESAREAN SECTION     X [place]  2015   SPINE SURGERY  05/29/2015   Social History:   reports that she has quit smoking. Her smoking use included cigarettes. She has a 10.00 pack-year smoking history. She has never used smokeless tobacco. She reports that she does not currently use alcohol. She reports that she does not currently use drugs.  Family History  Problem Relation Age of Onset   Diabetes Mother    Multiple sclerosis Mother    Heart disease Father    Heart Problems Father    Migraines Daughter    Thyroid disease Daughter    Migraines Daughter     Medications: Patient's Medications  New Prescriptions   No medications on file  Previous Medications   IRBESARTAN (AVAPRO) 75 MG TABLET    Take 1 tablet (75 mg total) by mouth daily.   LATANOPROST (XALATAN) 0.005 % OPHTHALMIC SOLUTION    Place 1 drop into both eyes at bedtime.   LORATADINE (CLARITIN) 10 MG TABLET    Take 1 tablet (10 mg total) by mouth daily.   MAGNESIUM GLYCINATE PO    Take 350 mg by mouth.   MELATONIN 5 MG TABS    Take by mouth at bedtime. 1-2 tablets   OVER THE COUNTER MEDICATION    daily. Essential Oils   SOLIFENACIN (VESICARE) 5 MG TABLET    Take 1 tablet (5 mg total) by mouth daily.   TRAZODONE (DESYREL) 100 MG TABLET    Take 2 tablets (200 mg total) by mouth at bedtime.  Modified Medications   No medications on file  Discontinued Medications   No medications on file     Physical Exam:  There were no vitals filed for this visit. There is no height or weight on file to calculate BMI. Wt Readings from Last 3 Encounters:  07/11/21 239 lb 12.8 oz (108.8 kg)  05/02/21 234 lb 9.6 oz (106.4 kg)  03/12/21 232 lb 9.6 oz (105.5 kg)      Labs reviewed: Basic Metabolic Panel: Recent Labs    03/12/21 1601  NA 139  K 3.9  CL 103  CO2 26  GLUCOSE 126  BUN 19  CREATININE 0.74  CALCIUM 9.1   Liver Function Tests: Recent Labs    03/12/21 1601  AST 15  ALT 17  BILITOT 0.3  PROT 6.7   No results for input(s): LIPASE, AMYLASE in the last 8760  hours. No results for input(s): AMMONIA in the last 8760 hours. CBC: Recent Labs    03/12/21 1601  WBC 6.0  NEUTROABS 3,300  HGB 13.4  HCT 40.4  MCV 94.4  PLT 244   Lipid Panel: No results for input(s): CHOL, HDL, LDLCALC, TRIG, CHOLHDL, LDLDIRECT in the last 8760 hours. TSH: No results for input(s): TSH in the last 8760 hours. A1C: Lab Results  Component Value Date   HGBA1C 5.9 (H) 03/12/2021     Assessment/Plan 1. Primary hypertension -slightly elevated on this mornings reading. She will continue to take medication at night and follow blood pressures. Goal <140/90. Recommended dietary modifications.   2. Overactive bladder -improved symptoms on vesicare  3. Dizziness -better today, likely blood pressure medication contributing but since she took medication last night symptoms have improved. She does have a hx of this and would like to have exercises to help symptom management.  - Ambulatory referral to Physical Therapy  4. Primary osteoarthritis of right hand -had injection in hand today to help with pain.    Next appt: 2 weeks on blood pressure Dashiel Bergquist K. Harle Battiest  [facility] [phone]    Virtual Visit via telephone  I connected with patient on 08/05/21 at  4:15 PM EST by telephone and verified that I am speaking with the correct person  using two identifiers.  Location: Patient: car Provider: [facility] [place]   I discussed the limitations, risks, security and privacy concerns of performing an evaluation and management service by telephone and the availability of in person appointments. I also discussed with the patient that there may be a patient responsible charge related to this service. The patient expressed understanding and agreed to proceed.   I discussed the assessment and treatment plan with the patient. The patient was provided an opportunity to ask questions and all were answered. The patient agreed with the plan and demonstrated an understanding of the instructions.   The patient was advised to call back or seek an in-person evaluation if the symptoms worsen or if the condition fails to improve as anticipated.  I provided 17 minutes of non-face-to-face time during this encounter.  Carlos American. Harle Battiest Avs printed and mailed

## 2021-08-06 ENCOUNTER — Encounter: Payer: Self-pay | Admitting: Physical Therapy

## 2021-08-06 ENCOUNTER — Ambulatory Visit: Payer: Medicare HMO | Attending: Nurse Practitioner | Admitting: Physical Therapy

## 2021-08-06 DIAGNOSIS — R252 Cramp and spasm: Secondary | ICD-10-CM | POA: Insufficient documentation

## 2021-08-06 DIAGNOSIS — R42 Dizziness and giddiness: Secondary | ICD-10-CM | POA: Diagnosis not present

## 2021-08-06 DIAGNOSIS — M6281 Muscle weakness (generalized): Secondary | ICD-10-CM | POA: Diagnosis not present

## 2021-08-06 DIAGNOSIS — N3281 Overactive bladder: Secondary | ICD-10-CM | POA: Diagnosis not present

## 2021-08-06 DIAGNOSIS — R2689 Other abnormalities of gait and mobility: Secondary | ICD-10-CM | POA: Diagnosis not present

## 2021-08-06 NOTE — Therapy (Signed)
[facility] @ [facility] [place], [place], [address] Phone: [phone]   Fax:  [phone]  Physical Therapy Evaluation  Patient Details  Name: Brenda Mcfarland MRN: [id number] Date of Birth: February 12, 1946 Referring Provider (PT): Lauree Chandler, NP   Encounter Date: 08/06/2021   PT End of Session - 08/06/21 1555     Visit Number 1    Date for PT Re-Evaluation 10/29/21    Authorization Type Medicare + State Health    PT Start Time 1500   arrived late/talking to front office   PT Stop Time 1530    PT Time Calculation (min) 30 min    Activity Tolerance Patient tolerated treatment well    Behavior During Therapy [facility] for tasks assessed/performed             Past Medical History:  Diagnosis Date   Abnormal EKG 10/23/2016   Arthritis 01/18/2020   Chronic fatigue 10/08/2015   Depression 01/18/2020   Diabetes mellitus ([place]) 10/08/2015   Formatting of this note might be different from the original. Overview:  last Hgb A1C 7.2/no blood sugars at home Formatting of this note might be different from the original. last Hgb A1C 7.2/no blood sugars at home   ESR raised 11/27/2016   Frequent urination 12/16/2018   Glaucoma 10/08/2015   High cholesterol    History of pancreatitis 07/11/2014   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/23/2016   Hypertension 10/08/2015   IFG (impaired fasting glucose) 10/24/2019   Incomplete emptying of bladder 11/30/2017   Knee pain, left 12/22/2016   Loss of memory 01/18/2020   Major depressive disorder in partial remission ([place]) 10/08/2015   Morbid obesity ([place]) 10/23/2016   Obesity (BMI 30-39.9) 11/10/2016   Obstructive sleep apnea 12/16/2018   Osteoarthritis of both knees 11/27/2016   Other insomnia 03/05/2017   Palpitations 10/23/2016   Positive ANA (antinuclear antibody) 10/23/2016   Sleep apnea 10/08/2015   Formatting of this note might be different from the original. Overview:  does not use CPAP Formatting of this note might be  different from the original. does not use CPAP   Sore neck 10/24/2019   Spondylosis of cervical spine with myelopathy 01/18/2020   Type 2 diabetes mellitus without complication, without long-term current use of insulin ([place]) 12/16/2018   Vitamin D deficiency 10/08/2015    Past Surgical History:  Procedure Laterality Date   APPENDECTOMY     age 16   CESAREAN SECTION     X 3   NECK SURGERY  2015   SPINE SURGERY  05/29/2015    There were no vitals filed for this visit.    Subjective Assessment - 08/06/21 1543     Subjective Pt is getting up every 2 hours at night but the medication is helping.  Pt states she can empty bladder but then end there is still more.  Pt having difficutly emptying bladder and up and down at night.  Pt has the sensation but she is not having leakage but having little dribbles.  Pt states one night it was trickling all.  Pt states she is having vertigo like the floor and walls are thrown off.  My speech and thinking is effected at the same time when the vertigo happens.  I also have tinnitus.  I noticed it came on after eating dessert the last time it happened.    Pertinent History chronic low back pain, neck pain, previous neck surgery, recent COVID infections, brain fog, memory impairments, T2DM,  OA bil knees, glaucoma    Diagnostic tests x-rays with chiropracter    Patient Stated Goals be coordinated, stand up straight, walk around the block, work on balance, able to empty bladder    Currently in Pain? Yes    Pain Score 8     Pain Location Hip    Pain Orientation Left    Pain Descriptors / Indicators Aching;Radiating    Pain Type Chronic pain    Pain Radiating Towards down leg to back of the leg not to the knee    Pain Onset 1 to 4 weeks ago    Pain Frequency Intermittent    Aggravating Factors  lying down    Multiple Pain Sites No                [facility] PT Assessment - 08/06/21 0001       Assessment   Medical Diagnosis overactive bladder     Referring Provider (PT) Lauree Chandler, NP      Precautions   Precautions None      Balance Screen   Has the patient fallen in the past 6 months No      Cognition   Overall Cognitive Status Within Functional Limits for tasks assessed      Posture/Postural Control   Posture/Postural Control Postural limitations    Postural Limitations Rounded Shoulders;Anterior pelvic tilt                        Objective measurements completed on examination: See above findings.     Pelvic Floor Special Questions - 08/06/21 0001     Prior Pregnancies Yes    Number of Pregnancies 3    Number of C-Sections 3    Currently Sexually Active No    Urinary Leakage No    Urinary urgency Yes    Urinary frequency yes   nocturia 4/night, dribbles througout the day but can hold it   Fecal incontinence No    Falling out feeling (prolapse) No    Prolapse None    Pelvic Floor Internal Exam pt identity confirmed and internal soft tissue assessed    Exam Type Vaginal    Sensation TTP throughout and trigger points along urethra bil    Palpation very restricted; releases slightly with stretch but unable to relax after contrcting    Strength weak squeeze, no lift    Strength # of reps 1    Strength # of seconds 2    Tone high                         PT Short Term Goals - 06/27/21 1156       PT SHORT TERM GOAL #1   Title Ind with initial HEP    Time 2    Period Weeks    Status Achieved   06/27/21   Target Date 06/17/21      PT SHORT TERM GOAL #2   Title Complete DGI/Berg to check for balance impairments.    Time 2    Period Weeks    Status Achieved   DGI 17/24   Target Date 06/17/21               PT Long Term Goals - 08/06/21 1750       PT LONG TERM GOAL #5   Title PELVIC: ind with pelvic HEP    Time 12    Period Weeks  Status New    Target Date 10/29/21      Additional Long Term Goals   Additional Long Term Goals Yes      PT LONG TERM GOAL  #6   Title Decreased nocturia to 1x/ night    Baseline 4    Time 12    Period Weeks    Status New    Target Date 10/29/21      PT LONG TERM GOAL #7   Title Pt will be able to empty bladder in less than one minute due to improved muscle tone and ability to relax    Baseline sometimes one hour of trickling    Time 12    Period Weeks    Status New    Target Date 10/29/21                    Plan - 08/06/21 1604     Clinical Impression Statement Pt presents to clinic today due to overactive bladder and vertigo.  Due to long history intake, only able to assess the pelvic floor and posture.  Pt has assessment from PT that she is seeing for hip pain as well that was included in today's assessment section.  Pt history of vertigo was taken in subjective.  Pt has 2/5 MMT of pelvic floor and holding for 2 seconds.  Pt has very tender and restricted muscle and fascia throughout vaginal canal.  Pt will benefit from skilled PT to address impairments for healthy pelvic floor and improved bladder function.    Personal Factors and Comorbidities Comorbidity 3+;Other    Comorbidities history neck pain/surgery, chronic LBP, recent COVID infection with brain fog, glaucoma, obesity, bil knee OA, T2DM.    PT Treatment/Interventions ADLs/Self Care Home Management;Cryotherapy;Electrical Stimulation;Iontophoresis 4mg /ml Dexamethasone;Moist Heat;Traction;Ultrasound;Gait training;Stair training;Functional mobility training;Therapeutic activities;Therapeutic exercise;Balance training;Neuromuscular re-education;Patient/family education;Manual techniques;Passive range of motion;Dry needling;Taping;Joint Manipulations;Spinal Manipulations;Biofeedback    PT Next Visit Plan internal fascial release    PT Home Exercise Plan Access Code: [email or web address], Access Code: [email or web address] (10/21)    Recommended Other Services vestibular PT, discussed dermatology due to her never having skin discolorations/large moles checked     Consulted and Agree with Plan of Care Patient             Patient will benefit from skilled therapeutic intervention in order to improve the following deficits and impairments:  Decreased activity tolerance, Decreased endurance, Decreased range of motion, Decreased strength, Increased fascial restricitons, Improper body mechanics, Pain, Postural dysfunction, Impaired flexibility, Increased muscle spasms, Decreased balance, Decreased coordination, Decreased safety awareness  Visit Diagnosis: Muscle weakness (generalized)  Cramp and spasm     Problem List Patient Active Problem List   Diagnosis Date Noted   Paroxysmal SVT (supraventricular tachycardia) ([place]) 01/22/2020   Arthritis 01/18/2020   Depression 01/18/2020   Loss of memory 01/18/2020   Spondylosis of cervical spine with myelopathy 01/18/2020   IFG (impaired fasting glucose) 10/24/2019   Sore neck 10/24/2019   Frequent urination 12/16/2018   Obstructive sleep apnea 12/16/2018   Type 2 diabetes mellitus without complication, without long-term current use of insulin ([place]) 12/16/2018   Incomplete emptying of bladder 11/30/2017   Other insomnia 03/05/2017   Knee pain, left 12/22/2016   ESR raised 11/27/2016   Osteoarthritis of both knees 11/27/2016   Obesity (BMI 30-39.9) 11/10/2016   Abnormal EKG 10/23/2016   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/23/2016   Morbid obesity ([place]) 10/23/2016   Palpitations 10/23/2016   Positive ANA (antinuclear antibody) 10/23/2016   Hypertension  10/08/2015   Chronic fatigue 10/08/2015   Diabetes mellitus ([place]) 10/08/2015   Glaucoma 10/08/2015   Major depressive disorder in partial remission ([place]) 10/08/2015   Vitamin D deficiency 10/08/2015   Sleep apnea 10/08/2015   History of pancreatitis 07/11/2014    Jule Ser, PT 08/06/2021, 5:53 PM  [facility] @ [place], [place], [address] Phone: [phone]   Fax:   [phone]  Name: Brenda Mcfarland MRN: [id number] Date of Birth: [birth year]

## 2021-08-07 ENCOUNTER — Ambulatory Visit: Payer: Medicare HMO | Admitting: Physical Therapy

## 2021-08-07 ENCOUNTER — Ambulatory Visit: Payer: Medicare HMO | Admitting: Orthopedic Surgery

## 2021-08-12 ENCOUNTER — Ambulatory Visit: Payer: Medicare HMO

## 2021-08-12 ENCOUNTER — Other Ambulatory Visit: Payer: Self-pay

## 2021-08-12 DIAGNOSIS — G8929 Other chronic pain: Secondary | ICD-10-CM | POA: Diagnosis not present

## 2021-08-12 DIAGNOSIS — R2689 Other abnormalities of gait and mobility: Secondary | ICD-10-CM

## 2021-08-12 DIAGNOSIS — R252 Cramp and spasm: Secondary | ICD-10-CM

## 2021-08-12 DIAGNOSIS — M6281 Muscle weakness (generalized): Secondary | ICD-10-CM | POA: Diagnosis not present

## 2021-08-12 DIAGNOSIS — M5442 Lumbago with sciatica, left side: Secondary | ICD-10-CM | POA: Diagnosis not present

## 2021-08-12 NOTE — Therapy (Addendum)
PHYSICAL THERAPY DISCHARGE SUMMARY  Visits from Start of Care: 13  Current functional level related to goals / functional outcomes: See below   Remaining deficits: Low back pain   Education / Equipment: HEP  Plan: Patient goals were not met. Patient is being discharged due to being referred to another location to work on vertigo and pelvic health.    Rennie Natter, PT, DPT 09/29/2021   [facility] [address]  [place], [place], [address] Phone: [phone]   Fax:  [phone]  Physical Therapy Treatment  Patient Details  Name: Brenda Mcfarland MRN: [id number] Date of Birth: [birth year] Referring Provider (PT): Lauree Chandler, NP   Encounter Date: 08/12/2021   PT End of Session - 08/12/21 1203     Visit Number 13    Number of Visits 20    Date for PT Re-Evaluation 08/26/21    Authorization Type Medicare + State Health    PT Start Time 1102    PT Stop Time 1145    PT Time Calculation (min) 43 min    Activity Tolerance Patient tolerated treatment well;Patient limited by pain    Behavior During Therapy [facility] for tasks assessed/performed             Past Medical History:  Diagnosis Date   Abnormal EKG 10/23/2016   Arthritis 01/18/2020   Chronic fatigue 10/08/2015   Depression 01/18/2020   Diabetes mellitus ([place]) 10/08/2015   Formatting of this note might be different from the original. Overview:  last Hgb A1C 7.2/no blood sugars at home Formatting of this note might be different from the original. last Hgb A1C 7.2/no blood sugars at home   ESR raised 11/27/2016   Frequent urination 12/16/2018   Glaucoma 10/08/2015   High cholesterol    History of pancreatitis 07/11/2014   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/23/2016   Hypertension 10/08/2015   IFG (impaired fasting glucose) 10/24/2019   Incomplete emptying of bladder 11/30/2017   Knee pain, left 12/22/2016   Loss of memory 01/18/2020   Major depressive disorder in  partial remission ([place]) 10/08/2015   Morbid obesity ([place]) 10/23/2016   Obesity (BMI 30-39.9) 11/10/2016   Obstructive sleep apnea 12/16/2018   Osteoarthritis of both knees 11/27/2016   Other insomnia 03/05/2017   Palpitations 10/23/2016   Positive ANA (antinuclear antibody) 10/23/2016   Sleep apnea 10/08/2015   Formatting of this note might be different from the original. Overview:  does not use CPAP Formatting of this note might be different from the original. does not use CPAP   Sore neck 10/24/2019   Spondylosis of cervical spine with myelopathy 01/18/2020   Type 2 diabetes mellitus without complication, without long-term current use of insulin ([place]) 12/16/2018   Vitamin D deficiency 10/08/2015    Past Surgical History:  Procedure Laterality Date   APPENDECTOMY     age 30   CESAREAN SECTION     X 3   NECK SURGERY  2015   SPINE SURGERY  05/29/2015    There were no vitals filed for this visit.   Subjective Assessment - 08/12/21 1104     Subjective Pt reports pain after the massage last session intermittently, still having some occasional dizziness.    Pertinent History chronic low back pain, neck pain, previous neck surgery, recent COVID infections, brain fog, memory impairments, T2DM, OA bil knees, glaucoma    Diagnostic tests x-rays with chiropracter    Patient Stated Goals be coordinated, stand up  straight, walk around the block, work on balance, able to empty bladder    Pain Score 1     Pain Location Hip    Pain Orientation Left    Pain Descriptors / Indicators Aching    Pain Type Chronic pain                [facility] PT Assessment - 08/12/21 0001       Observation/Other Assessments   Focus on Therapeutic Outcomes (FOTO)  lumbar 45, 53 predicted at discharge after 12 visits.                           [facility] Adult PT Treatment/Exercise - 08/12/21 0001       Lumbar Exercises: Stretches   Piriformis Stretch Right;Left;2 reps;30 seconds    Piriformis Stretch  Limitations seated hip hinge with leg supported on mat      Lumbar Exercises: Aerobic   Nustep L5x25mn      Lumbar Exercises: Standing   Row Strengthening;10 reps;Theraband    Theraband Level (Row) Level 3 (Green)    Shoulder Extension Strengthening;Both;10 reps;Theraband    Theraband Level (Shoulder Extension) Level 3 (Green)                 Balance Exercises - 08/12/21 0001       Balance Exercises: Standing   Standing Eyes Closed Solid surface;30 secs    Tandem Stance Eyes open;Upper extremity support 2;15 secs                  PT Short Term Goals - 06/27/21 1156       PT SHORT TERM GOAL #1   Title Ind with initial HEP    Time 2    Period Weeks    Status Achieved   06/27/21   Target Date 06/17/21      PT SHORT TERM GOAL #2   Title Complete DGI/Berg to check for balance impairments.    Time 2    Period Weeks    Status Achieved   DGI 17/24   Target Date 06/17/21               PT Long Term Goals - 08/12/21 1114       PT LONG TERM GOAL #1   Title Ind with progressed HEP to improve outcomes.    Time 6    Period Weeks    Status On-going   06/17/21- progressed 10/25- progressed adding standing counter exercises.  07/09/21- met for current   Target Date 08/26/21      PT LONG TERM GOAL #2   Title Improve walking tolerance to 1000' without limitation from back/leg pain    Baseline <50'    Time 6    Period Weeks    Status On-going   08/12/21- pt limited with walking tolerance from car to church, very short distance   Target Date 08/26/21      PT LONG TERM GOAL #3   Title Patient will improve score on DGI to >19/24 to decrease risk of falls.    Baseline 17/24 - high risk for falls.    Time 6    Period Weeks    Status Achieved   11/9- 21/24     PT LONG TERM GOAL #4   Title Pt. will report 75% improvement in low back pain.    Time 6    Period Weeks    Status On-going   08/05/21- 60%  improvement   Target Date 08/26/21                    Plan - 08/12/21 1159     Clinical Impression Statement Pt was consulted today about going on hold from PT due to her current issues with recent found vertigo and starting PT at [facility]. Briefly reviewed HEP, she didn't do too well with the scap stab exercises due to her thumb bothering her. She asked about doing balance exercises and I told her it was fine as long as she is safe with using counter support with a chair behind and not feeling dizziness. She will be placed on 30 day hold from PT.    Personal Factors and Comorbidities Comorbidity 3+;Other    Comorbidities history neck pain/surgery, chronic LBP, recent COVID infection with brain fog, glaucoma, obesity, bil knee OA, T2DM.    PT Frequency 2x / week    PT Duration 6 weeks    PT Treatment/Interventions ADLs/Self Care Home Management;Cryotherapy;Electrical Stimulation;Iontophoresis 87m/ml Dexamethasone;Moist Heat;Traction;Ultrasound;Gait training;Stair training;Functional mobility training;Therapeutic activities;Therapeutic exercise;Balance training;Neuromuscular re-education;Patient/family education;Manual techniques;Passive range of motion;Dry needling;Taping;Joint Manipulations;Spinal Manipulations    PT Next Visit Plan LE strengthening for core/LE/balance, manual to low back, modalities PRN    PT Home Exercise Plan Access Code: [email or web address] Access Code: [email or web address](10/[email or web address])    Consulted and Agree with Plan of Care Patient             Patient will benefit from skilled therapeutic intervention in order to improve the following deficits and impairments:  Decreased activity tolerance, Decreased endurance, Decreased range of motion, Decreased strength, Increased fascial restricitons, Improper body mechanics, Pain, Postural dysfunction, Impaired flexibility, Increased muscle spasms, Decreased balance, Decreased coordination, Decreased safety awareness  Visit Diagnosis: Muscle weakness (generalized)  Cramp and spasm  Chronic  left-sided low back pain with left-sided sciatica  Other abnormalities of gait and mobility     Problem List Patient Active Problem List   Diagnosis Date Noted   Paroxysmal SVT (supraventricular tachycardia) ([place] 01/22/2020   Arthritis 01/18/2020   Depression 01/18/2020   Loss of memory 01/18/2020   Spondylosis of cervical spine with myelopathy 01/18/2020   IFG (impaired fasting glucose) 10/24/2019   Sore neck 10/24/2019   Frequent urination 12/16/2018   Obstructive sleep apnea 12/16/2018   Type 2 diabetes mellitus without complication, without long-term current use of insulin ([place] 12/16/2018   Incomplete emptying of bladder 11/30/2017   Other insomnia 03/05/2017   Knee pain, left 12/22/2016   ESR raised 11/27/2016   Osteoarthritis of both knees 11/27/2016   Obesity (BMI 30-39.9) 11/10/2016   Abnormal EKG 10/23/2016   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/23/2016   Morbid obesity ([place] 10/23/2016   Palpitations 10/23/2016   Positive ANA (antinuclear antibody) 10/23/2016   Hypertension 10/08/2015   Chronic fatigue 10/08/2015   Diabetes mellitus ([place] 10/08/2015   Glaucoma 10/08/2015   Major depressive disorder in partial remission ([place] 10/08/2015   Vitamin D deficiency 10/08/2015   Sleep apnea 10/08/2015   History of pancreatitis 07/11/2014    BArtist Pais PTA 08/12/2021, 12:05 PM  [facility] [address] [place] [address]Phone: [phone]  Fax:  [phone] Name: CIdali LafeverMRN: [id number] of Birth: [birth year]

## 2021-08-13 ENCOUNTER — Encounter: Payer: Self-pay | Admitting: Rehabilitative and Restorative Service Providers"

## 2021-08-13 ENCOUNTER — Ambulatory Visit: Payer: Medicare HMO | Attending: Nurse Practitioner | Admitting: Rehabilitative and Restorative Service Providers"

## 2021-08-13 DIAGNOSIS — R252 Cramp and spasm: Secondary | ICD-10-CM

## 2021-08-13 DIAGNOSIS — M5442 Lumbago with sciatica, left side: Secondary | ICD-10-CM | POA: Insufficient documentation

## 2021-08-13 DIAGNOSIS — G8929 Other chronic pain: Secondary | ICD-10-CM | POA: Diagnosis not present

## 2021-08-13 DIAGNOSIS — R42 Dizziness and giddiness: Secondary | ICD-10-CM

## 2021-08-13 DIAGNOSIS — M6281 Muscle weakness (generalized): Secondary | ICD-10-CM | POA: Diagnosis not present

## 2021-08-13 DIAGNOSIS — R2689 Other abnormalities of gait and mobility: Secondary | ICD-10-CM

## 2021-08-13 NOTE — Therapy (Signed)
[facility] [place], [place], [address] Phone: [phone]   Fax:  [phone]  Physical Therapy Vestibular Evaluation  Patient Details  Name: Brenda Mcfarland MRN: [id number] Date of Birth: May 26, 1946 Referring Provider (PT): Lauree Chandler, NP   Encounter Date: 08/13/2021   PT End of Session - 08/13/21 1322     Visit Number 2   1 Pelvic, 1 Vestibular   Date for PT Re-Evaluation 10/03/21    Authorization Type Medicare + State Health    Progress Note Due on Visit 10    PT Start Time 1230    PT Stop Time 1320    PT Time Calculation (min) 50 min    Activity Tolerance Patient tolerated treatment well    Behavior During Therapy [facility] for tasks assessed/performed             Past Medical History:  Diagnosis Date   Abnormal EKG 10/23/2016   Arthritis 01/18/2020   Chronic fatigue 10/08/2015   Depression 01/18/2020   Diabetes mellitus ([place]) 10/08/2015   Formatting of this note might be different from the original. Overview:  last Hgb A1C 7.2/no blood sugars at home Formatting of this note might be different from the original. last Hgb A1C 7.2/no blood sugars at home   ESR raised 11/27/2016   Frequent urination 12/16/2018   Glaucoma 10/08/2015   High cholesterol    History of pancreatitis 07/11/2014   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/23/2016   Hypertension 10/08/2015   IFG (impaired fasting glucose) 10/24/2019   Incomplete emptying of bladder 11/30/2017   Knee pain, left 12/22/2016   Loss of memory 01/18/2020   Major depressive disorder in partial remission ([place]) 10/08/2015   Morbid obesity ([place]) 10/23/2016   Obesity (BMI 30-39.9) 11/10/2016   Obstructive sleep apnea 12/16/2018   Osteoarthritis of both knees 11/27/2016   Other insomnia 03/05/2017   Palpitations 10/23/2016   Positive ANA (antinuclear antibody) 10/23/2016   Sleep apnea 10/08/2015   Formatting of this note might be different from the original. Overview:  does not use CPAP  Formatting of this note might be different from the original. does not use CPAP   Sore neck 10/24/2019   Spondylosis of cervical spine with myelopathy 01/18/2020   Type 2 diabetes mellitus without complication, without long-term current use of insulin ([place]) 12/16/2018   Vitamin D deficiency 10/08/2015    Past Surgical History:  Procedure Laterality Date   APPENDECTOMY     age 40   CESAREAN SECTION     X 3   NECK SURGERY  2015   SPINE SURGERY  05/29/2015    There were no vitals filed for this visit.    Subjective Assessment - 08/13/21 1235     Subjective Pt reports that she has been having vestibular impairments since she had COVID over a year ago. Reports that the dizziness has gotten worse since she started having the impairment, but she tries to continue to perform exercises.  She states that if she has a busy day, she is very tired after and cannot do many activities the next day.    Pertinent History chronic low back pain, neck pain, previous neck surgery, recent COVID infections, brain fog, memory impairments, T2DM, OA bil knees, glaucoma    Diagnostic tests x-rays with chiropracter    Patient Stated Goals be coordinated, stand up straight, walk around the block, work on balance, able to empty bladder    Currently in Pain? Yes  Pain Score 1     Pain Location Hip    Pain Orientation Left    Pain Descriptors / Indicators Sore    Pain Type Chronic pain                [facility] PT Assessment - 08/13/21 0001       Assessment   Medical Diagnosis Vestibular Impairment    Referring Provider (PT) Lauree Chandler, NP    Hand Dominance Right    Next MD Visit in March    Prior Therapy yes, for L sciatica that has been stopped.  Pt now seeing PT for pelvic floor and now for vestibular      Precautions   Precautions Fall      Balance Screen   Has the patient fallen in the past 6 months No    Has the patient had a decrease in activity level because of a fear of falling?  No     Is the patient reluctant to leave their home because of a fear of falling?  No      Home Environment   Living Environment Private residence    Living Arrangements Alone    Type of [place] to enter    Home Layout Two level;Bed/bath upstairs    [place] - 2 wheels;Cane - single point      Cognition   Overall Cognitive Status Within Functional Limits for tasks assessed      Standardized Balance Assessment   Standardized Balance Assessment Berg Balance Test      Berg Balance Test   Sit to Stand Able to stand without using hands and stabilize independently    Standing Unsupported Able to stand safely 2 minutes    Sitting with Back Unsupported but Feet Supported on Floor or Stool Able to sit safely and securely 2 minutes    Stand to Sit Sits safely with minimal use of hands    Transfers Able to transfer safely, minor use of hands    Standing Unsupported with Eyes Closed Able to stand 10 seconds with supervision    Standing Unsupported with Feet Together Able to place feet together independently but unable to hold for 30 seconds    From Standing, Reach Forward with Outstretched Arm Can reach forward >12 cm safely (5")    From Standing Position, Pick up Object from Floor Able to pick up shoe, needs supervision    From Standing Position, Turn to Look Behind Over each Shoulder Looks behind one side only/other side shows less weight shift    Turn 360 Degrees Able to turn 360 degrees safely one side only in 4 seconds or less    Standing Unsupported, Alternately Place Feet on Step/Stool Able to stand independently and complete 8 steps >20 seconds    Standing Unsupported, One Foot in Front Able to take small step independently and hold 30 seconds    Standing on One Leg Able to lift leg independently and hold equal to or more than 3 seconds    Total Score 44                    Vestibular Assessment - 08/13/21 0001       Symptom Behavior    Subjective history of current problem Has had vesibular symptoms since having COVID over a year ago    Type of Dizziness  "Funny feeling in head"   spacy and disoriented feeling  Frequency of Dizziness multiple times a day    Duration of Dizziness pt reports that "it passes fairly quickly"    Symptom Nature Intermittent    Aggravating Factors Activity in general    Relieving Factors No known relieving factors    Progression of Symptoms Worse      Oculomotor Exam   Ocular ROM WFL    Spontaneous Absent    Gaze-induced  Absent    Smooth Pursuits Intact    Saccades Intact      Vestibulo-Ocular Reflex   Comment pt reports some disorientation with VOR head thrust test      Positional Testing   Dix-Hallpike Dix-Hallpike Right;Dix-Hallpike Left      Dix-Hallpike Right   Dix-Hallpike Right Duration negative      Dix-Hallpike Left   Dix-Hallpike Left Duration negative                Objective measurements completed on examination: See above findings.                  PT Short Term Goals - 08/13/21 1337       PT SHORT TERM GOAL #1   Title Ind with initial HEP    Time 2    Period Weeks    Status New               PT Long Term Goals - 08/13/21 1338       PT LONG TERM GOAL #1   Title Ind with progressed HEP to improve outcomes.    Time 8    Period Weeks    Status New      PT LONG TERM GOAL #2   Title Improve walking tolerance to at least 1000' without limitation from back/leg pain or dizziness/imbalance.    Time 8    Period Weeks    Status New      PT LONG TERM GOAL #3   Title Patient will improve score on BERG to at least 50/56 to decrease risk of falls.    Time 8    Period Weeks    Status New      PT LONG TERM GOAL #4   Title Pt. will report 60% improvement in vestibular impairment.    Time 8    Period Weeks                    Plan - 08/13/21 1329     Clinical Impression Statement Pt is a 75 y.o. female referred to  outpatient PT for Vestibular Rehab by Sherrie Mustache, NP. Pt was placed on hold/discharged from ortho outpatient PT on 08/13/21 to allow her to focus on vestibular impairments. Pt reports that she did not have any difficulty with vestibular system until after her Covid diagnosis.  She states that since that time, he has had feeling of disorientation and has also noted having increased nasal drainage. Pt reports that she is unable to take the Zyrtec prescribed due to it causing increased dizziness. Pt reports that in the past, she used a saline nasal rinse, and PT recommended that if pts PCP is in agreement, to utilize. Pt presents with hypofunctioning vestibular system and has decreased balance and decreased hip strength. Pts PLOF is independent and driving herself and enjoys quilting and sewing. She has been having some difficulty performing activities secondary to feeling spacey/disoriented with a recent bad bought of vertigo where she felt that she could not drive safely.  Pt would benefit from  skilled PT to address her functional impairments to allow her to perform her daily activities withot difficulty.    Personal Factors and Comorbidities Comorbidity 3+;Other    Comorbidities history neck pain/surgery, tinnitis, chronic LBP, recent COVID infection with brain fog, glaucoma, obesity, bil knee OA, T2DM.    Examination-Activity Limitations Bed Mobility;Stand;Sleep;Locomotion Level;Carry;Bend    Examination-Participation Restrictions Church;Community Activity;Shop;Cleaning    Stability/Clinical Decision Making Evolving/Moderate complexity    Clinical Decision Making Moderate    Rehab Potential Good    PT Frequency 2x / week    PT Duration 8 weeks    PT Treatment/Interventions ADLs/Self Care Home Management;Cryotherapy;Electrical Stimulation;Iontophoresis 36m/ml Dexamethasone;Moist Heat;Traction;Ultrasound;Gait training;Stair training;Functional mobility training;Therapeutic activities;Therapeutic  exercise;Balance training;Neuromuscular re-education;Patient/family education;Manual techniques;Passive range of motion;Dry needling;Taping;Joint Manipulations;Spinal Manipulations;Canalith Repostioning;Vestibular    PT Next Visit Plan assess and progress HEP, balance    PT Home Exercise Plan Access Code [email or web address]    Consulted and Agree with Plan of Care Patient             Patient will benefit from skilled therapeutic intervention in order to improve the following deficits and impairments:  Decreased activity tolerance, Decreased endurance, Decreased range of motion, Decreased strength, Increased fascial restricitons, Improper body mechanics, Pain, Postural dysfunction, Impaired flexibility, Increased muscle spasms, Decreased balance, Decreased coordination, Decreased safety awareness, Dizziness  Visit Diagnosis: Balance problem - Plan: PT plan of care cert/re-cert  Dizziness and giddiness - Plan: PT plan of care cert/re-cert  Muscle weakness (generalized) - Plan: PT plan of care cert/re-cert  Chronic left-sided low back pain with left-sided sciatica - Plan: PT plan of care cert/re-cert  Other abnormalities of gait and mobility - Plan: PT plan of care cert/re-cert  Cramp and spasm - Plan: PT plan of care cert/re-cert     Problem List Patient Active Problem List   Diagnosis Date Noted   Paroxysmal SVT (supraventricular tachycardia) ([place] 01/22/2020   Arthritis 01/18/2020   Depression 01/18/2020   Loss of memory 01/18/2020   Spondylosis of cervical spine with myelopathy 01/18/2020   IFG (impaired fasting glucose) 10/24/2019   Sore neck 10/24/2019   Frequent urination 12/16/2018   Obstructive sleep apnea 12/16/2018   Type 2 diabetes mellitus without complication, without long-term current use of insulin ([place] 12/16/2018   Incomplete emptying of bladder 11/30/2017   Other insomnia 03/05/2017   Knee pain, left 12/22/2016   ESR raised 11/27/2016   Osteoarthritis of both knees  11/27/2016   Obesity (BMI 30-39.9) 11/10/2016   Abnormal EKG 10/23/2016   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/23/2016   Morbid obesity ([place] 10/23/2016   Palpitations 10/23/2016   Positive ANA (antinuclear antibody) 10/23/2016   Hypertension 10/08/2015   Chronic fatigue 10/08/2015   Diabetes mellitus ([place] 10/08/2015   Glaucoma 10/08/2015   Major depressive disorder in partial remission ([place] 10/08/2015   Vitamin D deficiency 10/08/2015   Sleep apnea 10/08/2015   History of pancreatitis 07/11/2014    SJuel Burrow PT, DPT 08/13/2021, 1:44 PM  [facility] [place] [address]Phone: [phone]  Fax:  [phone] Name: Brenda Mcfarland: [id number] of Birth: [birth year]

## 2021-08-20 ENCOUNTER — Telehealth: Payer: Self-pay | Admitting: *Deleted

## 2021-08-20 NOTE — Telephone Encounter (Signed)
Patient called and stated that she saw you on 11/28 and you changed her blood pressure from Losartan to Irbesartan due to Dizziness.   Patient stated that she is still having Dizziness with the Irbesartan. Stated that she takes the medication before she goes to bed hoping the side effects would not be as bad.   Wonders if it needs to be changed to something that doesn't have a side effect of Dizziness.    Also,  Patient stated that she takes Trazodone 150mg  at bedtime not 200mg . Stated that someone has in her chart 200mg  and she rather stay on the 150mg  and have a Rx called in for it instead.    Please Advise. (Forwarded to Amy due to out of office)

## 2021-08-21 ENCOUNTER — Encounter: Payer: Self-pay | Admitting: Nurse Practitioner

## 2021-08-21 ENCOUNTER — Telehealth (INDEPENDENT_AMBULATORY_CARE_PROVIDER_SITE_OTHER): Payer: Medicare HMO | Admitting: Nurse Practitioner

## 2021-08-21 ENCOUNTER — Other Ambulatory Visit: Payer: Self-pay | Admitting: Orthopedic Surgery

## 2021-08-21 ENCOUNTER — Other Ambulatory Visit: Payer: Self-pay

## 2021-08-21 DIAGNOSIS — I10 Essential (primary) hypertension: Secondary | ICD-10-CM

## 2021-08-21 DIAGNOSIS — R42 Dizziness and giddiness: Secondary | ICD-10-CM | POA: Diagnosis not present

## 2021-08-21 DIAGNOSIS — G4700 Insomnia, unspecified: Secondary | ICD-10-CM

## 2021-08-21 MED ORDER — TRAZODONE HCL 100 MG PO TABS: 150.0000 mg | ORAL_TABLET | Freq: Every day | ORAL | 5 refills | Status: DC

## 2021-08-21 MED ORDER — AMLODIPINE BESYLATE 2.5 MG PO TABS: 2.5000 mg | ORAL_TABLET | Freq: Every day | ORAL | 1 refills | Status: DC

## 2021-08-21 NOTE — Telephone Encounter (Signed)
Patient stated that she did not take her Blood Pressure medication last night by mistake and this is the First morning that she has NOT been Spacy/Dizzy.   Patient is requesting to come off of the blood pressure medication. Stated that she is thinking about going back on her natural remedy's until after the Holidays. Stated because it is more dangerous for her to be taking the medication with driving and the risk of falls. Stated that she has a blood pressure cuff that she can keep check on the readings.   Stated that she is not trying to be stubborn but she does not like feeling like this. Refused appointment till after the Holidays.   Patient would like for Shanda Bumps to Advise.

## 2021-08-21 NOTE — Progress Notes (Signed)
Careteam: Patient Care Team: Lauree Chandler, NP as PCP - General (Geriatric Medicine) Berniece Salines, DO as PCP - Cardiology (Cardiology) Murlean Iba, MD as Referring Physician (Orthopedic Surgery) Park Liter, MD as Consulting Physician (Cardiology)  Advanced Directive information    Allergies  Allergen Reactions   Amlodipine Swelling   Aripiprazole Other (See Comments)    Tongue twisted, lips curled up Tongue twisted, lips curled up    Carvedilol Other (See Comments)    Nightmares Nightmares    Paroxetine Hcl Other (See Comments)    . Marland Kitchen    Tizanidine Other (See Comments)    Syncope Syncope    Tramadol Other (See Comments)    Unknown   Zolpidem Other (See Comments)    Sleepwalking Sleepwalking    Zyrtec [Cetirizine Hcl]     Dizziness    Chief Complaint  Patient presents with   Medical Management of Chronic Issues     HPI: Patient is a 75 y.o. female due to dizziness.  She was initially started on losartan but changed to irbesartan due to dizziness. She she has been on irbesartan she has also felt off- disoriented, dizziness.   Disorientation/dizziness that has effected her walking and thinking.  She was unsteady yesterday Last night she forgot to take her medication and this morning she work up and functioning better than she has in weeks. Feeling normal.  No dizziness feeling or disorientation.  She was also feeling limited in driving.   She has not taken her blood pressure this morning.  With medication her blood pressure was in the 140s/80s.  Blood pressure today is 140/110 via wrist cuff.   Has been going to PT for dizziness and bladder.   Review of Systems:  Review of Systems  Constitutional:  Negative for chills, fever and weight loss.  HENT:  Negative for tinnitus.   Respiratory:  Negative for cough, sputum production and shortness of breath.   Cardiovascular:  Negative for chest pain, palpitations and leg  swelling.  Gastrointestinal:  Negative for abdominal pain, constipation, diarrhea and heartburn.  Genitourinary:  Negative for dysuria, frequency and urgency.  Musculoskeletal:  Negative for back pain, falls, joint pain and myalgias.  Skin: Negative.   Neurological:  Negative for dizziness and headaches.  Psychiatric/Behavioral:  Negative for depression and memory loss. The patient does not have insomnia.    Past Medical History:  Diagnosis Date   Abnormal EKG 10/23/2016   Arthritis 01/18/2020   Chronic fatigue 10/08/2015   Depression 01/18/2020   Diabetes mellitus ([place]) 10/08/2015   Formatting of this note might be different from the original. Overview:  last Hgb A1C 7.2/no blood sugars at home Formatting of this note might be different from the original. last Hgb A1C 7.2/no blood sugars at home   ESR raised 11/27/2016   Frequent urination 12/16/2018   Glaucoma 10/08/2015   High cholesterol    History of pancreatitis 07/11/2014   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/23/2016   Hypertension 10/08/2015   IFG (impaired fasting glucose) 10/24/2019   Incomplete emptying of bladder 11/30/2017   Knee pain, left 12/22/2016   Loss of memory 01/18/2020   Major depressive disorder in partial remission ([place]) 10/08/2015   Morbid obesity ([place]) 10/23/2016   Obesity (BMI 30-39.9) 11/10/2016   Obstructive sleep apnea 12/16/2018   Osteoarthritis of both knees 11/27/2016   Other insomnia 03/05/2017   Palpitations 10/23/2016   Positive ANA (antinuclear antibody) 10/23/2016   Sleep apnea 10/08/2015   Formatting of this  note might be different from the original. Overview:  does not use CPAP Formatting of this note might be different from the original. does not use CPAP   Sore neck 10/24/2019   Spondylosis of cervical spine with myelopathy 01/18/2020   Type 2 diabetes mellitus without complication, without long-term current use of insulin ([place]) 12/16/2018   Vitamin D deficiency 10/08/2015   Past Surgical History:  Procedure Laterality Date    APPENDECTOMY     age 55   CESAREAN SECTION     X [place]  2015   SPINE SURGERY  05/29/2015   Social History:   reports that she has quit smoking. Her smoking use included cigarettes. She has a 10.00 pack-year smoking history. She has never used smokeless tobacco. She reports that she does not currently use alcohol. She reports that she does not currently use drugs.  Family History  Problem Relation Age of Onset   Diabetes Mother    Multiple sclerosis Mother    Heart disease Father    Heart Problems Father    Migraines Daughter    Thyroid disease Daughter    Migraines Daughter     Medications: Patient's Medications  New Prescriptions   No medications on file  Previous Medications   LATANOPROST (XALATAN) 0.005 % OPHTHALMIC SOLUTION    Place 1 drop into both eyes at bedtime.   LORATADINE (CLARITIN) 10 MG TABLET    Take 1 tablet (10 mg total) by mouth daily.   MAGNESIUM GLYCINATE PO    Take 350 mg by mouth.   MELATONIN 5 MG TABS    Take by mouth at bedtime. 1-2 tablets   OVER THE COUNTER MEDICATION    daily. Essential Oils   SOLIFENACIN (VESICARE) 5 MG TABLET    Take 1 tablet (5 mg total) by mouth daily.   TRAZODONE (DESYREL) 100 MG TABLET    Take 1.5 tablets (150 mg total) by mouth at bedtime.  Modified Medications   No medications on file  Discontinued Medications   IRBESARTAN (AVAPRO) 75 MG TABLET    Take 1 tablet (75 mg total) by mouth daily.    Physical Exam:  There were no vitals filed for this visit. There is no height or weight on file to calculate BMI. Wt Readings from Last 3 Encounters:  07/11/21 239 lb 12.8 oz (108.8 kg)  05/02/21 234 lb 9.6 oz (106.4 kg)  03/12/21 232 lb 9.6 oz (105.5 kg)    Physical Exam Constitutional:      Appearance: Normal appearance.  Neurological:     Mental Status: She is alert and oriented to person, place, and time.    Labs reviewed: Basic Metabolic Panel: Recent Labs    03/12/21 1601  NA 139  K 3.9  CL 103   CO2 26  GLUCOSE 126  BUN 19  CREATININE 0.74  CALCIUM 9.1   Liver Function Tests: Recent Labs    03/12/21 1601  AST 15  ALT 17  BILITOT 0.3  PROT 6.7   No results for input(s): LIPASE, AMYLASE in the last 8760 hours. No results for input(s): AMMONIA in the last 8760 hours. CBC: Recent Labs    03/12/21 1601  WBC 6.0  NEUTROABS 3,300  HGB 13.4  HCT 40.4  MCV 94.4  PLT 244   Lipid Panel: No results for input(s): CHOL, HDL, LDLCALC, TRIG, CHOLHDL, LDLDIRECT in the last 8760 hours. TSH: No results for input(s): TSH in the last 8760 hours. A1C: Lab Results  Component Value Date   HGBA1C 5.9 (H) 03/12/2021     Assessment/Plan 1. Primary hypertension -will have her stop irbesartan at this time. Dietary modifications encouraged.  -she has intolerance to amlodipine due to swelling but reports this was well over 10 years ago and willing to try low dose to see if this is effective without side effect.  - amLODipine (NORVASC) 2.5 MG tablet; Take 1 tablet (2.5 mg total) by mouth daily.  Dispense: 30 tablet; Refill: 1  2. Dizziness Has improved since off irbesartan. Will continue to monitor.    Next appt: 4 weeks.  Carlos American. Harle Battiest  [facility] [phone]    Virtual Visit via video  I connected with patient on 08/21/21 at 11:30 AM EST by mychart video and verified that I am speaking with the correct person using two identifiers.  Location: Patient: home Provider: [facility]   I discussed the limitations, risks, security and privacy concerns of performing an evaluation and management service by telephone and the availability of in person appointments. I also discussed with the patient that there may be a patient responsible charge related to this service. The patient expressed understanding and agreed to proceed.   I discussed the assessment and treatment plan with the patient. The patient was provided an opportunity to ask  questions and all were answered. The patient agreed with the plan and demonstrated an understanding of the instructions.   The patient was advised to call back or seek an in-person evaluation if the symptoms worsen or if the condition fails to improve as anticipated.  I provided 18 minutes of non-face-to-face time during this encounter.  Carlos American. Harle Battiest Avs printed and mailed

## 2021-08-21 NOTE — Patient Instructions (Addendum)
Stop irbesartan  Start norvasc 2.5 mg by mouth daily for blood pressure  To monitor blood pressure at home.  Goal <140/90.

## 2021-08-21 NOTE — Telephone Encounter (Signed)
Pt has virtual visit for today

## 2021-08-21 NOTE — Telephone Encounter (Signed)
She had a video visit with Shanda Bumps 12/06. Advised to continue to blood pressure medication prescribed. Vertigo discussed that encounter. Shanda Bumps discussed starting PT as she suspects it is vestibular in nature. Her last encounters have been on the phone or virtual visits. Recommend she schedule with Shanda Bumps to be seen to discuss more. New prescription for Trazodone sent into pharmacy.

## 2021-08-22 ENCOUNTER — Encounter: Payer: Medicare HMO | Admitting: Physical Therapy

## 2021-08-26 ENCOUNTER — Ambulatory Visit: Payer: Medicare HMO | Admitting: Rehabilitative and Restorative Service Providers"

## 2021-08-28 ENCOUNTER — Encounter: Payer: Self-pay | Admitting: Rehabilitative and Restorative Service Providers"

## 2021-08-28 ENCOUNTER — Ambulatory Visit: Payer: Medicare HMO | Admitting: Rehabilitative and Restorative Service Providers"

## 2021-08-28 ENCOUNTER — Other Ambulatory Visit: Payer: Self-pay

## 2021-08-28 DIAGNOSIS — R42 Dizziness and giddiness: Secondary | ICD-10-CM | POA: Diagnosis not present

## 2021-08-28 DIAGNOSIS — M6281 Muscle weakness (generalized): Secondary | ICD-10-CM

## 2021-08-28 DIAGNOSIS — R2689 Other abnormalities of gait and mobility: Secondary | ICD-10-CM

## 2021-08-28 DIAGNOSIS — R252 Cramp and spasm: Secondary | ICD-10-CM | POA: Diagnosis not present

## 2021-08-28 DIAGNOSIS — N3281 Overactive bladder: Secondary | ICD-10-CM | POA: Diagnosis not present

## 2021-08-28 NOTE — Therapy (Signed)
[facility] @ [address] [place], [place], [address] Phone: [phone]   Fax:  [phone]  Physical Therapy Treatment  Patient Details  Name: Brenda Mcfarland MRN: [id number] Date of Birth: 1945/10/13 Referring Provider (PT): Lauree Chandler, NP   Encounter Date: 08/28/2021   PT End of Session - 08/28/21 1539     Visit Number 3   1 pelvic, 2 vestibular   Number of Visits 20    Date for PT Re-Evaluation 10/03/21    Authorization Type Medicare + State Health    Progress Note Due on Visit 10    PT Start Time 1530    PT Stop Time 1610    PT Time Calculation (min) 40 min    Activity Tolerance Patient tolerated treatment well    Behavior During Therapy [facility] for tasks assessed/performed             Past Medical History:  Diagnosis Date   Abnormal EKG 10/23/2016   Arthritis 01/18/2020   Chronic fatigue 10/08/2015   Depression 01/18/2020   Diabetes mellitus ([place]) 10/08/2015   Formatting of this note might be different from the original. Overview:  last Hgb A1C 7.2/no blood sugars at home Formatting of this note might be different from the original. last Hgb A1C 7.2/no blood sugars at home   ESR raised 11/27/2016   Frequent urination 12/16/2018   Glaucoma 10/08/2015   High cholesterol    History of pancreatitis 07/11/2014   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/23/2016   Hypertension 10/08/2015   IFG (impaired fasting glucose) 10/24/2019   Incomplete emptying of bladder 11/30/2017   Knee pain, left 12/22/2016   Loss of memory 01/18/2020   Major depressive disorder in partial remission ([place]) 10/08/2015   Morbid obesity ([place]) 10/23/2016   Obesity (BMI 30-39.9) 11/10/2016   Obstructive sleep apnea 12/16/2018   Osteoarthritis of both knees 11/27/2016   Other insomnia 03/05/2017   Palpitations 10/23/2016   Positive ANA (antinuclear antibody) 10/23/2016   Sleep apnea 10/08/2015   Formatting of this note might be different from the original. Overview:  does not  use CPAP Formatting of this note might be different from the original. does not use CPAP   Sore neck 10/24/2019   Spondylosis of cervical spine with myelopathy 01/18/2020   Type 2 diabetes mellitus without complication, without long-term current use of insulin ([place]) 12/16/2018   Vitamin D deficiency 10/08/2015    Past Surgical History:  Procedure Laterality Date   APPENDECTOMY     age 38   CESAREAN SECTION     X 3   NECK SURGERY  2015   SPINE SURGERY  05/29/2015    There were no vitals filed for this visit.   Subjective Assessment - 08/28/21 1533     Subjective Pt reports that her MD switched her BP medication to a low dose of Amlodipine in hopes that would decrease her dizziness. Pt reports that her dizziness has improved since changing her medication.  Reports that the dizziness is not as severe and debilitating.  Pt reports that sleeping on her L side makes the dizziness worse.    Patient Stated Goals be coordinated, stand up straight, walk around the block, work on balance, able to empty bladder    Currently in Pain? Yes    Pain Score 3     Pain Location Hip    Pain Orientation Left    Pain Descriptors / Indicators Sore    Pain Type Chronic pain  Vestibular Assessment - 08/28/21 0001       Positional Testing   Dix-Hallpike Dix-Hallpike Left      Dix-Hallpike Left   Dix-Hallpike Left Duration reports "feeling funny"    Dix-Hallpike Left Symptoms Other (comment)   pt reports feeling funny                      Vestibular Treatment/Exercise - 08/28/21 0001       Vestibular Treatment/Exercise   Vestibular Treatment Provided Canalith Repositioning    Canalith Repositioning Epley Manuever Left    Habituation Exercises [employer]    Gaze Exercises X1 Viewing Horizontal;X1 Viewing Vertical       EPLEY MANUEVER LEFT   Number of Reps  1    Overall Response  Improved Symptoms      Nestor Lewandowsky   Number of Reps  1    Symptom  Description  Pt denies symptoms when on R side, has dizziness and nausea on L side      X1 Viewing Horizontal   Comments 2x30 sec      X1 Viewing Vertical   Comments 2x30 sec                      PT Short Term Goals - 08/28/21 1634       PT SHORT TERM GOAL #1   Title Ind with initial HEP    Status On-going               PT Long Term Goals - 08/13/21 1338       PT LONG TERM GOAL #1   Title Ind with progressed HEP to improve outcomes.    Time 8    Period Weeks    Status New      PT LONG TERM GOAL #2   Title Improve walking tolerance to at least 1000' without limitation from back/leg pain or dizziness/imbalance.    Time 8    Period Weeks    Status New      PT LONG TERM GOAL #3   Title Patient will improve score on BERG to at least 50/56 to decrease risk of falls.    Time 8    Period Weeks    Status New      PT LONG TERM GOAL #4   Title Pt. will report 60% improvement in vestibular impairment.    Time 8    Period Weeks                   Plan - 08/28/21 1608     Clinical Impression Statement BP of 138/90.  Pt with some "feeling funny" with Epley Maneuver, however, has increased dizziness and nausea following Nestor Lewandowsky to left side.  Pt required some cuing during VOR 1x1 seated exercise for pacing, but was able to properly perform with cuing. Pt with dizziness still noted by end of PT session following Nestor Lewandowsky exercise, but was starting to subside some, required CGA with ambulation out of PT clinic to her daughter just to ensure safety as pt was cautious following.    Personal Factors and Comorbidities Comorbidity 3+    Comorbidities history neck pain/surgery, tinnitis, chronic LBP, recent COVID infection with brain fog, glaucoma, obesity, bil knee OA, T2DM.    PT Treatment/Interventions ADLs/Self Care Home Management;Cryotherapy;Electrical Stimulation;Iontophoresis 69m/ml Dexamethasone;Moist Heat;Traction;Ultrasound;Gait  training;Stair training;Functional mobility training;Therapeutic activities;Therapeutic exercise;Balance training;Neuromuscular re-education;Patient/family education;Manual techniques;Passive range of motion;Dry needling;Taping;Joint Manipulations;Spinal Manipulations;Canalith Repostioning;Vestibular  PT Next Visit Plan assess and progress HEP, balance    Consulted and Agree with Plan of Care Patient             Patient will benefit from skilled therapeutic intervention in order to improve the following deficits and impairments:  Decreased activity tolerance, Decreased endurance, Decreased range of motion, Decreased strength, Increased fascial restricitons, Improper body mechanics, Pain, Postural dysfunction, Impaired flexibility, Increased muscle spasms, Decreased balance, Decreased coordination, Decreased safety awareness, Dizziness  Visit Diagnosis: Balance problem  Dizziness and giddiness  Muscle weakness (generalized)     Problem List Patient Active Problem List   Diagnosis Date Noted   Paroxysmal SVT (supraventricular tachycardia) ([place]) 01/22/2020   Arthritis 01/18/2020   Depression 01/18/2020   Loss of memory 01/18/2020   Spondylosis of cervical spine with myelopathy 01/18/2020   IFG (impaired fasting glucose) 10/24/2019   Sore neck 10/24/2019   Frequent urination 12/16/2018   Obstructive sleep apnea 12/16/2018   Type 2 diabetes mellitus without complication, without long-term current use of insulin ([place]) 12/16/2018   Incomplete emptying of bladder 11/30/2017   Other insomnia 03/05/2017   Knee pain, left 12/22/2016   ESR raised 11/27/2016   Osteoarthritis of both knees 11/27/2016   Obesity (BMI 30-39.9) 11/10/2016   Abnormal EKG 10/23/2016   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/23/2016   Morbid obesity ([place]) 10/23/2016   Palpitations 10/23/2016   Positive ANA (antinuclear antibody) 10/23/2016   Hypertension 10/08/2015   Chronic fatigue 10/08/2015   Diabetes mellitus ([place])  10/08/2015   Glaucoma 10/08/2015   Major depressive disorder in partial remission ([place]) 10/08/2015   Vitamin D deficiency 10/08/2015   Sleep apnea 10/08/2015   History of pancreatitis 07/11/2014    Juel Burrow, PT, DPT 08/28/2021, 4:35 PM  [facility] @ [address] [place], [place], [address] Phone: [phone]   Fax:  [phone]  Name: Brenda Mcfarland MRN: [id number] Date of Birth: 03/13/1946

## 2021-08-29 ENCOUNTER — Encounter: Payer: Medicare HMO | Admitting: Physical Therapy

## 2021-09-03 ENCOUNTER — Encounter: Payer: Medicare HMO | Admitting: Rehabilitative and Restorative Service Providers"

## 2021-09-03 ENCOUNTER — Ambulatory Visit: Payer: Medicare HMO | Admitting: Physical Therapy

## 2021-09-05 ENCOUNTER — Telehealth: Payer: Self-pay

## 2021-09-05 NOTE — Telephone Encounter (Signed)
Yes okay to take 2

## 2021-09-05 NOTE — Telephone Encounter (Signed)
Patient states she is taking Norvasc 2.5 mg daily and it works for about 15 hours. When she checks her BP it is typically around 171/83. She wants to know can she take two at bedtime tonight?   I advised the patient, she may not get a response until the next business day.

## 2021-09-05 NOTE — Telephone Encounter (Signed)
DWP. Patient will double med and call back when refill is needed.

## 2021-09-10 ENCOUNTER — Other Ambulatory Visit: Payer: Self-pay

## 2021-09-10 ENCOUNTER — Encounter: Payer: Self-pay | Admitting: Rehabilitative and Restorative Service Providers"

## 2021-09-10 ENCOUNTER — Ambulatory Visit: Payer: Medicare HMO | Admitting: Rehabilitative and Restorative Service Providers"

## 2021-09-10 ENCOUNTER — Ambulatory Visit: Payer: Medicare HMO | Attending: Nurse Practitioner | Admitting: Physical Therapy

## 2021-09-10 DIAGNOSIS — M6281 Muscle weakness (generalized): Secondary | ICD-10-CM

## 2021-09-10 DIAGNOSIS — R252 Cramp and spasm: Secondary | ICD-10-CM | POA: Diagnosis not present

## 2021-09-10 DIAGNOSIS — R42 Dizziness and giddiness: Secondary | ICD-10-CM

## 2021-09-10 DIAGNOSIS — G8929 Other chronic pain: Secondary | ICD-10-CM | POA: Diagnosis present

## 2021-09-10 DIAGNOSIS — M5442 Lumbago with sciatica, left side: Secondary | ICD-10-CM | POA: Insufficient documentation

## 2021-09-10 DIAGNOSIS — R2689 Other abnormalities of gait and mobility: Secondary | ICD-10-CM | POA: Insufficient documentation

## 2021-09-10 NOTE — Therapy (Signed)
[facility] @ [address] [place], [place], [address] Phone: [phone]   Fax:  [phone]  Physical Therapy Treatment  Patient Details  Name: Brenda Mcfarland MRN: [id number] Date of Birth: [birth year] Referring Provider (PT): Brenda Chandler, NP   Encounter Date: 09/10/2021   PT End of Session - 09/10/21 1207     Visit Number 4   1 pelvic, 3 vestibular   Date for PT Re-Evaluation 10/03/21    Authorization Type Medicare + State Health    Progress Note Due on Visit 10    PT Start Time 1145    PT Stop Time 1225    PT Time Calculation (min) 40 min    Activity Tolerance Patient tolerated treatment well    Behavior During Therapy [facility] for tasks assessed/performed             Past Medical History:  Diagnosis Date   Abnormal EKG 10/23/2016   Arthritis 01/18/2020   Chronic fatigue 10/08/2015   Depression 01/18/2020   Diabetes mellitus ([place]) 10/08/2015   Formatting of this note might be different from the original. Overview:  last Hgb A1C 7.2/no blood sugars at home Formatting of this note might be different from the original. last Hgb A1C 7.2/no blood sugars at home   ESR raised 11/27/2016   Frequent urination 12/16/2018   Glaucoma 10/08/2015   High cholesterol    History of pancreatitis 07/11/2014   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/23/2016   Hypertension 10/08/2015   IFG (impaired fasting glucose) 10/24/2019   Incomplete emptying of bladder 11/30/2017   Knee pain, left 12/22/2016   Loss of memory 01/18/2020   Major depressive disorder in partial remission ([place]) 10/08/2015   Morbid obesity ([place]) 10/23/2016   Obesity (BMI 30-39.9) 11/10/2016   Obstructive sleep apnea 12/16/2018   Osteoarthritis of both knees 11/27/2016   Other insomnia 03/05/2017   Palpitations 10/23/2016   Positive ANA (antinuclear antibody) 10/23/2016   Sleep apnea 10/08/2015   Formatting of this note might be different from the original. Overview:  does not use CPAP Formatting of  this note might be different from the original. does not use CPAP   Sore neck 10/24/2019   Spondylosis of cervical spine with myelopathy 01/18/2020   Type 2 diabetes mellitus without complication, without long-term current use of insulin ([place]) 12/16/2018   Vitamin D deficiency 10/08/2015    Past Surgical History:  Procedure Laterality Date   APPENDECTOMY     age 14   CESAREAN SECTION     X 3   NECK SURGERY  2015   SPINE SURGERY  05/29/2015    There were no vitals filed for this visit.   Subjective Assessment - 09/10/21 1151     Subjective Today I am having a good day. Pt reports that she has had some increased pain in her L hip region since having manual therapy and manual trigger point release by previous PT session.  Pt reports that the dizziness has gotten better.    Pertinent History chronic low back pain, neck pain, previous neck surgery, recent COVID infections, brain fog, memory impairments, T2DM, OA bil knees, glaucoma    Diagnostic tests x-rays with chiropracter    Patient Stated Goals be coordinated, stand up straight, walk around the block, work on balance, able to empty bladder    Currently in Pain? Yes    Pain Score 3     Pain Location Hip    Pain Orientation Left  Pain Descriptors / Indicators Jabbing    Pain Type Chronic pain                     Vestibular Assessment - 09/10/21 0001       Positional Testing   Dix-Hallpike Dix-Hallpike Left      Dix-Hallpike Left   Dix-Hallpike Left Duration negative                      [facility] Adult PT Treatment/Exercise - 09/10/21 0001       Lumbar Exercises: Stretches   Passive Hamstring Stretch Right;Left;2 reps;20 seconds    Passive Hamstring Stretch Limitations with strap    Piriformis Stretch 2 reps;20 seconds    Piriformis Stretch Limitations in sitting      Lumbar Exercises: Aerobic   Nustep L3 x5 with PT present to discuss progress.      Manual Therapy   Manual Therapy Soft tissue  mobilization;Myofascial release    Manual therapy comments to decrease muscle spasm and pain    Soft tissue mobilization to glutes, piriformis    Myofascial Release skilled palpation to locate trigger points in glut and piriformis.             Vestibular Treatment/Exercise - 09/10/21 0001       Vestibular Treatment/Exercise   Vestibular Treatment Provided Canalith Repositioning    Canalith Repositioning Epley Manuever Left    Habituation Exercises Nestor Lewandowsky       EPLEY MANUEVER LEFT   Number of Reps  1    Overall Response  Symptoms Resolved             Trigger Point Dry Needling - 09/10/21 0001     Consent Given? Yes    Education Handout Provided Yes    Muscles Treated Back/Hip Piriformis    Piriformis Response Twitch response elicited;Palpable increased muscle length                     PT Short Term Goals - 09/10/21 1234       PT SHORT TERM GOAL #1   Title Ind with initial HEP    Status Achieved               PT Long Term Goals - 08/13/21 1338       PT LONG TERM GOAL #1   Title Ind with progressed HEP to improve outcomes.    Time 8    Period Weeks    Status New      PT LONG TERM GOAL #2   Title Improve walking tolerance to at least 1000' without limitation from back/leg pain or dizziness/imbalance.    Time 8    Period Weeks    Status New      PT LONG TERM GOAL #3   Title Patient will improve score on BERG to at least 50/56 to decrease risk of falls.    Time 8    Period Weeks    Status New      PT LONG TERM GOAL #4   Title Pt. will report 60% improvement in vestibular impairment.    Time 8    Period Weeks                   Plan - 09/10/21 1226     Clinical Impression Statement Brenda Mcfarland arrives at PT session reporting that her dizziness has improved greatly since last visit.  She states that she has  been doing her HEP and can tell that it is helping.  Pt states that her biggest complaint is the pain in her  piriformis region and is agreeable to having dry needling.  Following DN, pt reports that her piriformis region is feeling much loser.  Pt without dizziness or nystagmus during Dix-Hallpike.  Pt continues to require continued skilled PT to progress towards goal related activities.    Personal Factors and Comorbidities Comorbidity 3+    Comorbidities history neck pain/surgery, tinnitis, chronic LBP, recent COVID infection with brain fog, glaucoma, obesity, bil knee OA, T2DM.    PT Treatment/Interventions ADLs/Self Care Home Management;Cryotherapy;Electrical Stimulation;Iontophoresis 104m/ml Dexamethasone;Moist Heat;Traction;Ultrasound;Gait training;Stair training;Functional mobility training;Therapeutic activities;Therapeutic exercise;Balance training;Neuromuscular re-education;Patient/family education;Manual techniques;Passive range of motion;Dry needling;Taping;Joint Manipulations;Spinal Manipulations;Canalith Repostioning;Vestibular    PT Next Visit Plan assess and progress HEP, balance    Consulted and Agree with Plan of Care Patient             Patient will benefit from skilled therapeutic intervention in order to improve the following deficits and impairments:  Decreased activity tolerance, Decreased endurance, Decreased range of motion, Decreased strength, Increased fascial restricitons, Improper body mechanics, Pain, Postural dysfunction, Impaired flexibility, Increased muscle spasms, Decreased balance, Decreased coordination, Decreased safety awareness, Dizziness  Visit Diagnosis: Balance problem  Dizziness and giddiness  Muscle weakness (generalized)  Chronic left-sided low back pain with left-sided sciatica  Cramp and spasm  Other abnormalities of gait and mobility     Problem List Patient Active Problem List   Diagnosis Date Noted   Paroxysmal SVT (supraventricular tachycardia) ([place] 01/22/2020   Arthritis 01/18/2020   Depression 01/18/2020   Loss of memory 01/18/2020    Spondylosis of cervical spine with myelopathy 01/18/2020   IFG (impaired fasting glucose) 10/24/2019   Sore neck 10/24/2019   Frequent urination 12/16/2018   Obstructive sleep apnea 12/16/2018   Type 2 diabetes mellitus without complication, without long-term current use of insulin ([place] 12/16/2018   Incomplete emptying of bladder 11/30/2017   Other insomnia 03/05/2017   Knee pain, left 12/22/2016   ESR raised 11/27/2016   Osteoarthritis of both knees 11/27/2016   Obesity (BMI 30-39.9) 11/10/2016   Abnormal EKG 10/23/2016   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/23/2016   Morbid obesity ([place] 10/23/2016   Palpitations 10/23/2016   Positive ANA (antinuclear antibody) 10/23/2016   Hypertension 10/08/2015   Chronic fatigue 10/08/2015   Diabetes mellitus ([place] 10/08/2015   Glaucoma 10/08/2015   Major depressive disorder in partial remission ([place] 10/08/2015   Vitamin D deficiency 10/08/2015   Sleep apnea 10/08/2015   History of pancreatitis 07/11/2014    SJuel Burrow PT, DPT 09/10/2021, 12:35 PM  [facility] [place] [address]Phone: [phone]  Fax:  [phone] Name: Brenda ConveryMRN: [id number] of Birth: 5Apr 25, 1947

## 2021-09-10 NOTE — Therapy (Signed)
[facility] @ [place], [place], [address] Phone: [phone]   Fax:  [phone]  Physical Therapy Treatment  Patient Details  Name: Brenda Mcfarland MRN: [id number] Date of Birth: 1946/05/13 Referring Provider (PT): Lauree Chandler, NP   Encounter Date: 09/10/2021   PT End of Session - 09/10/21 1713     Visit Number 5   2 PF, 3 vestibular   Number of Visits 20    Date for PT Re-Evaluation 10/03/21    Authorization Type Medicare + State Health    Progress Note Due on Visit 10    PT Start Time 1231    PT Stop Time 1312    PT Time Calculation (min) 41 min    Activity Tolerance Patient tolerated treatment well    Behavior During Therapy [facility] for tasks assessed/performed             Past Medical History:  Diagnosis Date   Abnormal EKG 10/23/2016   Arthritis 01/18/2020   Chronic fatigue 10/08/2015   Depression 01/18/2020   Diabetes mellitus ([place]) 10/08/2015   Formatting of this note might be different from the original. Overview:  last Hgb A1C 7.2/no blood sugars at home Formatting of this note might be different from the original. last Hgb A1C 7.2/no blood sugars at home   ESR raised 11/27/2016   Frequent urination 12/16/2018   Glaucoma 10/08/2015   High cholesterol    History of pancreatitis 07/11/2014   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/23/2016   Hypertension 10/08/2015   IFG (impaired fasting glucose) 10/24/2019   Incomplete emptying of bladder 11/30/2017   Knee pain, left 12/22/2016   Loss of memory 01/18/2020   Major depressive disorder in partial remission ([place]) 10/08/2015   Morbid obesity ([place]) 10/23/2016   Obesity (BMI 30-39.9) 11/10/2016   Obstructive sleep apnea 12/16/2018   Osteoarthritis of both knees 11/27/2016   Other insomnia 03/05/2017   Palpitations 10/23/2016   Positive ANA (antinuclear antibody) 10/23/2016   Sleep apnea 10/08/2015   Formatting of this note might be different from the original. Overview:  does not use  CPAP Formatting of this note might be different from the original. does not use CPAP   Sore neck 10/24/2019   Spondylosis of cervical spine with myelopathy 01/18/2020   Type 2 diabetes mellitus without complication, without long-term current use of insulin ([place]) 12/16/2018   Vitamin D deficiency 10/08/2015    Past Surgical History:  Procedure Laterality Date   APPENDECTOMY     age 76   CESAREAN SECTION     X 3   NECK SURGERY  2015   SPINE SURGERY  05/29/2015    There were no vitals filed for this visit.   Subjective Assessment - 09/10/21 1235     Subjective Pt is feeling much better after dry needling treatment.  Pt if feeling she is getting up down at night several times (3) and going multiple times with just a small trickle    Pertinent History chronic low back pain, neck pain, previous neck surgery, recent COVID infections, brain fog, memory impairments, T2DM, OA bil knees, glaucoma    Diagnostic tests x-rays with chiropracter    Patient Stated Goals be coordinated, stand up straight, walk around the block, work on balance, able to empty bladder    Currently in Pain? No/denies  [facility] Adult PT Treatment/Exercise - 09/10/21 1721       Self-Care   Other Self-Care Comments  reviewed/educated on urge technique      Neuro Re-ed    Neuro Re-ed Details  educated on breathing technique to bulge pelvic floor with stretches and with STM      Lumbar Exercises: Stretches   Active Hamstring Stretch Right;Left;2 reps;20 seconds    Active Hamstring Stretch Limitations in sitting    Passive Hamstring Stretch Right;Left;2 reps;20 seconds    Passive Hamstring Stretch Limitations with strap    Piriformis Stretch 2 reps;20 seconds    Piriformis Stretch Limitations in sitting      Lumbar Exercises: Aerobic   Nustep L3 x5 with PT present to discuss progress.      Manual Therapy   Manual Therapy Internal Pelvic Floor;Myofascial release     Manual therapy comments pt identity and confirmed consent    Myofascial Release lower abdomen for bladder release during internal releases    Internal Pelvic Floor vaginal canal on either side of the urethra fascial release through 4 planes of fascia                       PT Short Term Goals - 09/10/21 1234       PT SHORT TERM GOAL #1   Title Ind with initial HEP    Status Achieved               PT Long Term Goals - 08/13/21 1338       PT LONG TERM GOAL #1   Title Ind with progressed HEP to improve outcomes.    Time 8    Period Weeks    Status New      PT LONG TERM GOAL #2   Title Improve walking tolerance to at least 1000' without limitation from back/leg pain or dizziness/imbalance.    Time 8    Period Weeks    Status New      PT LONG TERM GOAL #3   Title Patient will improve score on BERG to at least 50/56 to decrease risk of falls.    Time 8    Period Weeks    Status New      PT LONG TERM GOAL #4   Title Pt. will report 60% improvement in vestibular impairment.    Time 8    Period Weeks                   Plan - 09/10/21 1715     Clinical Impression Statement Pt tolerated treatement well.  She had release throughout vaginal canal . Pt was educated on using coconut oil for dryness and to help reduce bladder irritation due to senstive skin.  Pt was educated in stretching and breathing she can use to maintain and continue to relax pelvc floor muscles.  Pt will benefit from therapy to continue addressing pelvic floor muscles for reduced urgency and leakage.    PT Treatment/Interventions ADLs/Self Care Home Management;Cryotherapy;Electrical Stimulation;Iontophoresis 32m/ml Dexamethasone;Moist Heat;Traction;Ultrasound;Gait training;Stair training;Functional mobility training;Therapeutic activities;Therapeutic exercise;Balance training;Neuromuscular re-education;Patient/family education;Manual techniques;Passive range of motion;Dry  needling;Taping;Joint Manipulations;Spinal Manipulations;Canalith Repostioning;Vestibular    PT Next Visit Plan assess and progress HEP, balance; PELVIC; review breathing and bulging and basic core and kegels    PT Home Exercise Plan Access Code [email or web address]    Consulted and Agree with Plan of Care Patient             Patient will  benefit from skilled therapeutic intervention in order to improve the following deficits and impairments:  Decreased activity tolerance, Decreased endurance, Decreased range of motion, Decreased strength, Increased fascial restricitons, Improper body mechanics, Pain, Postural dysfunction, Impaired flexibility, Increased muscle spasms, Decreased balance, Decreased coordination, Decreased safety awareness, Dizziness  Visit Diagnosis: Cramp and spasm  Muscle weakness (generalized)     Problem List Patient Active Problem List   Diagnosis Date Noted   Paroxysmal SVT (supraventricular tachycardia) ([place]) 01/22/2020   Arthritis 01/18/2020   Depression 01/18/2020   Loss of memory 01/18/2020   Spondylosis of cervical spine with myelopathy 01/18/2020   IFG (impaired fasting glucose) 10/24/2019   Sore neck 10/24/2019   Frequent urination 12/16/2018   Obstructive sleep apnea 12/16/2018   Type 2 diabetes mellitus without complication, without long-term current use of insulin ([place]) 12/16/2018   Incomplete emptying of bladder 11/30/2017   Other insomnia 03/05/2017   Knee pain, left 12/22/2016   ESR raised 11/27/2016   Osteoarthritis of both knees 11/27/2016   Obesity (BMI 30-39.9) 11/10/2016   Abnormal EKG 10/23/2016   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/23/2016   Morbid obesity ([place]) 10/23/2016   Palpitations 10/23/2016   Positive ANA (antinuclear antibody) 10/23/2016   Hypertension 10/08/2015   Chronic fatigue 10/08/2015   Diabetes mellitus ([place]) 10/08/2015   Glaucoma 10/08/2015   Major depressive disorder in partial remission ([place]) 10/08/2015   Vitamin D deficiency  10/08/2015   Sleep apnea 10/08/2015   History of pancreatitis 07/11/2014    Camillo Flaming Timiyah Romito, PT 09/10/2021, 5:24 PM  [facility] @ [place], [place], [address] Phone: [phone]   Fax:  [phone]  Name: Brenda Mcfarland MRN: [id number] Date of Birth: 04-03-1946

## 2021-09-10 NOTE — Patient Instructions (Signed)
°  Stretches: sitting one leg crossed and sitting with one leg straight - breathing and relax into the stretch  Moisturizing - Use coconut oil all around vulva and in the vaginal canal   - use a pea size amount  - use at night  [facility] [address], [address] [place], [place] [address] Phone # [phone] Fax [phone]

## 2021-09-10 NOTE — Patient Instructions (Signed)

## 2021-09-12 ENCOUNTER — Other Ambulatory Visit: Payer: Self-pay

## 2021-09-12 DIAGNOSIS — N3281 Overactive bladder: Secondary | ICD-10-CM

## 2021-09-12 MED ORDER — SOLIFENACIN SUCCINATE 5 MG PO TABS: 5.0000 mg | ORAL_TABLET | Freq: Every day | ORAL | 5 refills | Status: DC

## 2021-09-17 ENCOUNTER — Encounter: Payer: Self-pay | Admitting: Rehabilitative and Restorative Service Providers"

## 2021-09-17 ENCOUNTER — Encounter: Payer: Self-pay | Admitting: Physical Therapy

## 2021-09-17 ENCOUNTER — Other Ambulatory Visit: Payer: Self-pay

## 2021-09-17 ENCOUNTER — Ambulatory Visit: Payer: Medicare HMO | Admitting: Rehabilitative and Restorative Service Providers"

## 2021-09-17 ENCOUNTER — Ambulatory Visit: Payer: Medicare HMO | Admitting: Physical Therapy

## 2021-09-17 DIAGNOSIS — R2689 Other abnormalities of gait and mobility: Secondary | ICD-10-CM | POA: Diagnosis not present

## 2021-09-17 DIAGNOSIS — M5442 Lumbago with sciatica, left side: Secondary | ICD-10-CM | POA: Diagnosis not present

## 2021-09-17 DIAGNOSIS — R252 Cramp and spasm: Secondary | ICD-10-CM

## 2021-09-17 DIAGNOSIS — G8929 Other chronic pain: Secondary | ICD-10-CM

## 2021-09-17 DIAGNOSIS — M6281 Muscle weakness (generalized): Secondary | ICD-10-CM

## 2021-09-17 DIAGNOSIS — R42 Dizziness and giddiness: Secondary | ICD-10-CM | POA: Diagnosis not present

## 2021-09-17 NOTE — Therapy (Signed)
[place] @ [place], [place], [address] Phone: [phone]   Fax:  [phone]  Physical Therapy Treatment  Patient Details  Name: Brenda Mcfarland MRN: [id number] Date of Birth: [birth year] Referring Provider (PT): Lauree Chandler, NP   Encounter Date: 09/17/2021   PT End of Session - 09/17/21 1236     Visit Number 6   2 PF, 4 vestibular   Date for PT Re-Evaluation 10/03/21    Authorization Type Medicare + State Health    Progress Note Due on Visit 10    PT Start Time 1216    PT Stop Time 1255    PT Time Calculation (min) 39 min    Activity Tolerance Patient tolerated treatment well    Behavior During Therapy [facility] for tasks assessed/performed             Past Medical History:  Diagnosis Date   Abnormal EKG 10/23/2016   Arthritis 01/18/2020   Chronic fatigue 10/08/2015   Depression 01/18/2020   Diabetes mellitus ([place]) 10/08/2015   Formatting of this note might be different from the original. Overview:  last Hgb A1C 7.2/no blood sugars at home Formatting of this note might be different from the original. last Hgb A1C 7.2/no blood sugars at home   ESR raised 11/27/2016   Frequent urination 12/16/2018   Glaucoma 10/08/2015   High cholesterol    History of pancreatitis 07/11/2014   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/23/2016   Hypertension 10/08/2015   IFG (impaired fasting glucose) 10/24/2019   Incomplete emptying of bladder 11/30/2017   Knee pain, left 12/22/2016   Loss of memory 01/18/2020   Major depressive disorder in partial remission ([place]) 10/08/2015   Morbid obesity ([place]) 10/23/2016   Obesity (BMI 30-39.9) 11/10/2016   Obstructive sleep apnea 12/16/2018   Osteoarthritis of both knees 11/27/2016   Other insomnia 03/05/2017   Palpitations 10/23/2016   Positive ANA (antinuclear antibody) 10/23/2016   Sleep apnea 10/08/2015   Formatting of this note might be different from the original. Overview:  does not use CPAP Formatting of this  note might be different from the original. does not use CPAP   Sore neck 10/24/2019   Spondylosis of cervical spine with myelopathy 01/18/2020   Type 2 diabetes mellitus without complication, without long-term current use of insulin ([place]) 12/16/2018   Vitamin D deficiency 10/08/2015    Past Surgical History:  Procedure Laterality Date   APPENDECTOMY     age 51   CESAREAN SECTION     X 3   NECK SURGERY  2015   SPINE SURGERY  05/29/2015    There were no vitals filed for this visit.   Subjective Assessment - 09/17/21 1220     Subjective Pt reports that her dizziness has been better.  She has not noticed the same flare ups that she has been having before. Pt states that she is no longer having dizziness when lying in bed.  Pt reports on vestibular she is feeling  50-60% better since starting therapy.    Pertinent History chronic low back pain, neck pain, previous neck surgery, recent COVID infections, brain fog, memory impairments, T2DM, OA bil knees, glaucoma    Patient Stated Goals be coordinated, stand up straight, walk around the block, work on balance, able to empty bladder    Currently in Pain? Yes    Pain Score 4     Pain Location Hip    Pain Orientation Left    Pain  Descriptors / Indicators Sharp    Pain Type Chronic pain                               [facility] Adult PT Treatment/Exercise - 09/17/21 0001       Lumbar Exercises: Aerobic   Nustep L6 x6 min with PT present to discuss progress.      Lumbar Exercises: Standing   Other Standing Lumbar Exercises 3 way hip x10 B without UE support      Lumbar Exercises: Seated   Sit to Stand 10 reps   standing on blue foam     Manual Therapy   Manual Therapy Soft tissue mobilization;Myofascial release    Manual therapy comments to decrease muscle spasm and pain    Soft tissue mobilization to glutes, piriformis. lumbar multifidi    Myofascial Release skilled palpation to locate trigger points in lumbar  multifidi, glut and piriformis.             Vestibular Treatment/Exercise - 09/17/21 0001       Vestibular Treatment/Exercise   Habituation Exercises Standing Horizontal Head Turns;Standing Vertical Head Turns      Standing Horizontal Head Turns   Symptom Description  Ambulation down the hallway performing horizontal head turns.      Standing Vertical Head Turns   Symptom Description  Ambulation down the hallway performing vertical head turns.      X1 Viewing Horizontal   Comments 2x30 sec      X1 Viewing Vertical   Comments 2x30 sec             Trigger Point Dry Needling - 09/17/21 0001     Consent Given? Yes    Education Handout Provided Previously provided    Muscles Treated Back/Hip Piriformis;Lumbar multifidi    Piriformis Response Twitch response elicited;Palpable increased muscle length    Lumbar multifidi Response Twitch response elicited;Palpable increased muscle length                     PT Short Term Goals - 09/10/21 1234       PT SHORT TERM GOAL #1   Title Ind with initial HEP    Status Achieved               PT Long Term Goals - 09/17/21 1307       PT LONG TERM GOAL #1   Title Ind with progressed HEP to improve outcomes.    Status On-going      PT LONG TERM GOAL #2   Title Improve walking tolerance to at least 1000' without limitation from back/leg pain or dizziness/imbalance.    Status On-going      PT LONG TERM GOAL #3   Title Patient will improve score on BERG to at least 50/56 to decrease risk of falls.    Status On-going      PT LONG TERM GOAL #4   Title Pt. will report 60% improvement in vestibular impairment.    Status Partially Met                   Plan - 09/17/21 1304     Clinical Impression Statement Brenda Mcfarland continues to progress with goal related activities.  She was able to progress to more advanced vestibular ther ex of ambulating down hallway performing head turns.  She did experience  dizziness with this task, but was able to complete.  Added  standing hip exercises without UE to facilitate strengthening and dynamic balance. Pt overall is reported fewer dizzy symptoms. Pt continues to require skilled PT to address her functional impariments to allow her to be active without increased pain and dizziness.    Personal Factors and Comorbidities Comorbidity 3+    Comorbidities history neck pain/surgery, tinnitis, chronic LBP, recent COVID infection with brain fog, glaucoma, obesity, bil knee OA, T2DM.    PT Treatment/Interventions ADLs/Self Care Home Management;Cryotherapy;Electrical Stimulation;Iontophoresis 15m/ml Dexamethasone;Moist Heat;Traction;Ultrasound;Gait training;Stair training;Functional mobility training;Therapeutic activities;Therapeutic exercise;Balance training;Neuromuscular re-education;Patient/family education;Manual techniques;Passive range of motion;Dry needling;Taping;Joint Manipulations;Spinal Manipulations;Canalith Repostioning;Vestibular    PT Next Visit Plan assess and progress HEP, balance; PELVIC; review breathing and bulging and basic core and kegels    Consulted and Agree with Plan of Care Patient             Patient will benefit from skilled therapeutic intervention in order to improve the following deficits and impairments:  Decreased activity tolerance, Decreased endurance, Decreased range of motion, Decreased strength, Increased fascial restricitons, Improper body mechanics, Pain, Postural dysfunction, Impaired flexibility, Increased muscle spasms, Decreased balance, Decreased coordination, Decreased safety awareness, Dizziness  Visit Diagnosis: Balance problem  Dizziness and giddiness  Muscle weakness (generalized)  Chronic left-sided low back pain with left-sided sciatica  Cramp and spasm     Problem List Patient Active Problem List   Diagnosis Date Noted   Paroxysmal SVT (supraventricular tachycardia) (HCC) 01/22/2020   Arthritis  01/18/2020   Depression 01/18/2020   Loss of memory 01/18/2020   Spondylosis of cervical spine with myelopathy 01/18/2020   IFG (impaired fasting glucose) 10/24/2019   Sore neck 10/24/2019   Frequent urination 12/16/2018   Obstructive sleep apnea 12/16/2018   Type 2 diabetes mellitus without complication, without long-term current use of insulin ([place] 12/16/2018   Incomplete emptying of bladder 11/30/2017   Other insomnia 03/05/2017   Knee pain, left 12/22/2016   ESR raised 11/27/2016   Osteoarthritis of both knees 11/27/2016   Obesity (BMI 30-39.9) 11/10/2016   Abnormal EKG 10/23/2016   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/23/2016   Morbid obesity ([place] 10/23/2016   Palpitations 10/23/2016   Positive ANA (antinuclear antibody) 10/23/2016   Hypertension 10/08/2015   Chronic fatigue 10/08/2015   Diabetes mellitus ([place] 10/08/2015   Glaucoma 10/08/2015   Major depressive disorder in partial remission ([place] 10/08/2015   Vitamin D deficiency 10/08/2015   Sleep apnea 10/08/2015   History of pancreatitis 07/11/2014    SJuel Mcfarland PT, DPT 09/17/2021, 1:08 PM  [email or web address] [address]Phone: [phone]  Fax:  [phone] Name: CIlaisaane MartsMRN: [id number] of Birth: [birth year]

## 2021-09-17 NOTE — Patient Instructions (Addendum)
Double Voiding can be a very useful technique to help overcome incomplete emptying of your bladder.  Incomplete emptying of urine can result in leakage after using the bathroom and increase the risk of urinary tract infection.   Initial Void: When you first sit down to urinate, ensure optimal positioning for bladder emptying by following these guidelines for toileting posture: Sit on the toilet seat - dont hover over the seat Support your trunk by placing your hands on your knees or thighs Spread your knees and hips wide Position your feet flat on the floor or elevate feet on phone books, foot stool (Squatty Potty), or wrapped toilet paper rolls (if having knees above hips helps you empty) Lean forward from your hips Maintain the normal inward curve in your lower back   Repeated Void: After your initial void is complete, follow these movement patterns and attempt going to the bathroom again. Stand up Rotate your hips as if doing hula hoop in one direction Rotate using the same action in the other direction Rock your hips and pelvis back and forwards ("pelvic tilts") Rock your hips and pelvis side to side ("tail wag") Sit back down and repeat your voiding technique This technique can be repeated as many times as you choose to help you empty your bladder more effectively. Access Code: [email or web address] URL: [email or web address] Date: 09/17/2021 Prepared by: Dwana Curd  Exercises Seated Shoulder Row with Resistance Anchored at Feet - 1 x daily - 7 x weekly - 2 sets - 10 reps Seated Shoulder Extension with Resistance - 1 x daily - 7 x weekly - 2 sets - 10 reps Seated Shoulder Abduction with Resistance - 1 x daily - 7 x weekly - 2 sets - 10 reps Seated Shoulder Row with Anchored Resistance - 1 x daily - 7 x weekly - 2 sets - 10 reps Putty Squeezes - 1 x daily - 7 x weekly - 1 sets Seated Bilateral AROM Pectoralis Stretch - 1 x daily - 7 x weekly - 1 sets - 10 reps Seated  Gaze Stabilization with Head Rotation - 1 x daily - 7 x weekly - 2 sets - 10 reps Seated Gaze Stabilization with Head Nod - 1 x daily - 7 x weekly - 2 sets - 10 reps Seated Horizontal Smooth Pursuit - 1 x daily - 7 x weekly - 2 sets - 10 reps Single Leg Stance with Support - 1 x daily - 7 x weekly - 1 sets - 10 reps Brandt-Daroff Vestibular Exercise - 1-2 x daily - 7 x weekly - 1 sets - 5 reps Standing Low Back Flexion at Table - 3 x daily - 7 x weekly - 1 sets - 8 reps

## 2021-09-17 NOTE — Therapy (Signed)
[place] @ [place], [place], [address] Phone: [phone]   Fax:  [phone]  Physical Therapy Treatment  Patient Details  Name: Brenda Mcfarland MRN: [id number] Date of Birth: 05-Oct-1945 Referring Provider (PT): Lauree Chandler, NP   Encounter Date: 09/17/2021   PT End of Session - 09/17/21 1404     Visit Number 7    Number of Visits 20    Date for PT Re-Evaluation 10/03/21    Authorization Type Medicare + State Health    Progress Note Due on Visit 10    PT Start Time 1400    PT Stop Time 1438    PT Time Calculation (min) 38 min    Activity Tolerance Patient tolerated treatment well    Behavior During Therapy [facility] for tasks assessed/performed             Past Medical History:  Diagnosis Date   Abnormal EKG 10/23/2016   Arthritis 01/18/2020   Chronic fatigue 10/08/2015   Depression 01/18/2020   Diabetes mellitus ([place]) 10/08/2015   Formatting of this note might be different from the original. Overview:  last Hgb A1C 7.2/no blood sugars at home Formatting of this note might be different from the original. last Hgb A1C 7.2/no blood sugars at home   ESR raised 11/27/2016   Frequent urination 12/16/2018   Glaucoma 10/08/2015   High cholesterol    History of pancreatitis 07/11/2014   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/23/2016   Hypertension 10/08/2015   IFG (impaired fasting glucose) 10/24/2019   Incomplete emptying of bladder 11/30/2017   Knee pain, left 12/22/2016   Loss of memory 01/18/2020   Major depressive disorder in partial remission ([place]) 10/08/2015   Morbid obesity ([place]) 10/23/2016   Obesity (BMI 30-39.9) 11/10/2016   Obstructive sleep apnea 12/16/2018   Osteoarthritis of both knees 11/27/2016   Other insomnia 03/05/2017   Palpitations 10/23/2016   Positive ANA (antinuclear antibody) 10/23/2016   Sleep apnea 10/08/2015   Formatting of this note might be different from the original. Overview:  does not use CPAP Formatting of  this note might be different from the original. does not use CPAP   Sore neck 10/24/2019   Spondylosis of cervical spine with myelopathy 01/18/2020   Type 2 diabetes mellitus without complication, without long-term current use of insulin ([place]) 12/16/2018   Vitamin D deficiency 10/08/2015    Past Surgical History:  Procedure Laterality Date   APPENDECTOMY     age 52   CESAREAN SECTION     X 3   NECK SURGERY  2015   SPINE SURGERY  05/29/2015    There were no vitals filed for this visit.   Subjective Assessment - 09/17/21 1404     Subjective Pt states she goes to bed and when she has the urge to go she able hold it and not give in to it.  I go to bed and have to get right back up to go again and it is just a couple drops.  Nocturia is 2-3/night    Patient Stated Goals be coordinated, stand up straight, walk around the block, work on balance, able to empty bladder    Currently in Pain? No/denies                               [facility] Adult PT Treatment/Exercise - 09/17/21 1609       Self-Care   Other  Self-Care Comments  double voiding      Lumbar Exercises: Standing   Other Standing Lumbar Exercises lean on mat table breathing and bulging then exhale lift and tighten; arms straight and forearm positions    Other Standing Lumbar Exercises standing lifting 2lb ; standing staggered stance lifting no weight and kegel on exhale      Lumbar Exercises: Seated   Sit to Stand --   standing on blue foam   Other Seated Lumbar Exercises lifting with exhale on exertion - 2lb x 10 reps    Other Seated Lumbar Exercises seated h/s stretch Rt side and gently on Lt; piriformis stretch bil      Manual Therapy   Myofascial Release \                     PT Education - 09/17/21 1457     Education Details Access Code: [email or web address]; double voiding    Person(s) Educated Patient    Methods Explanation;Demonstration;Tactile cues;Verbal cues;Handout    Comprehension Verbalized  understanding;Returned demonstration              PT Short Term Goals - 09/10/21 1234       PT SHORT TERM GOAL #1   Title Ind with initial HEP    Status Achieved               PT Long Term Goals - 09/17/21 1413       PT LONG TERM GOAL #5   Title PELVIC: ind with pelvic HEP    Status On-going      PT LONG TERM GOAL #6   Title Decreased nocturia to 1x/ night    Status On-going      PT LONG TERM GOAL #7   Title Pt will be able to empty bladder in less than one minute due to improved muscle tone and ability to relax    Baseline I sit and it feels like it never empties    Status On-going                   Plan - 09/17/21 1601     Clinical Impression Statement Pt was able to feel muscles much better in standing and leaning positions. Pt felt improved back pain after doing the hamstring stretch, but did gently on the left side because she didn't want to flare things up and felt like she did last time with that stretch.  Pt was able to do breathing correctly today.  She will benefit from skilled PT to continue working on strength and endurance ensuring she is not holding increased tension and still able to relax muscles correctly for bladder release.  Pt also educated on double voiding today and will need f/u next time.    PT Treatment/Interventions ADLs/Self Care Home Management;Cryotherapy;Electrical Stimulation;Iontophoresis 47m/ml Dexamethasone;Moist Heat;Traction;Ultrasound;Gait training;Stair training;Functional mobility training;Therapeutic activities;Therapeutic exercise;Balance training;Neuromuscular re-education;Patient/family education;Manual techniques;Passive range of motion;Dry needling;Taping;Joint Manipulations;Spinal Manipulations;Canalith Repostioning;Vestibular    PT Next Visit Plan assess and progress HEP, balance; PELVIC; f/u on double void; progress core and pelvic floor strength with cont stretches and bulging as tolerated;  Palpate bulge in sitting     PT Home Exercise Plan Access Code [email or web address]    Consulted and Agree with Plan of Care Patient             Patient will benefit from skilled therapeutic intervention in order to improve the following deficits and impairments:  Decreased activity tolerance, Decreased endurance, Decreased range of motion,  Decreased strength, Increased fascial restricitons, Improper body mechanics, Pain, Postural dysfunction, Impaired flexibility, Increased muscle spasms, Decreased balance, Decreased coordination, Decreased safety awareness, Dizziness  Visit Diagnosis: Muscle weakness (generalized)  Chronic left-sided low back pain with left-sided sciatica  Cramp and spasm     Problem List Patient Active Problem List   Diagnosis Date Noted   Paroxysmal SVT (supraventricular tachycardia) ([place]) 01/22/2020   Arthritis 01/18/2020   Depression 01/18/2020   Loss of memory 01/18/2020   Spondylosis of cervical spine with myelopathy 01/18/2020   IFG (impaired fasting glucose) 10/24/2019   Sore neck 10/24/2019   Frequent urination 12/16/2018   Obstructive sleep apnea 12/16/2018   Type 2 diabetes mellitus without complication, without long-term current use of insulin ([place]) 12/16/2018   Incomplete emptying of bladder 11/30/2017   Other insomnia 03/05/2017   Knee pain, left 12/22/2016   ESR raised 11/27/2016   Osteoarthritis of both knees 11/27/2016   Obesity (BMI 30-39.9) 11/10/2016   Abnormal EKG 10/23/2016   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/23/2016   Morbid obesity ([place]) 10/23/2016   Palpitations 10/23/2016   Positive ANA (antinuclear antibody) 10/23/2016   Hypertension 10/08/2015   Chronic fatigue 10/08/2015   Diabetes mellitus ([place]) 10/08/2015   Glaucoma 10/08/2015   Major depressive disorder in partial remission ([place]) 10/08/2015   Vitamin D deficiency 10/08/2015   Sleep apnea 10/08/2015   History of pancreatitis 07/11/2014    Jule Ser, PT 09/17/2021, 4:12 PM  [place] @ [place], [place], [address] Phone: [phone]   Fax:  [phone]  Name: Brenda Mcfarland MRN: [id number] Date of Birth: [birth year]

## 2021-09-23 ENCOUNTER — Telehealth: Payer: Self-pay

## 2021-09-23 MED ORDER — AMLODIPINE BESYLATE 2.5 MG PO TABS: 5.0000 mg | ORAL_TABLET | Freq: Every day | ORAL | 2 refills | Status: DC

## 2021-09-23 NOTE — Telephone Encounter (Signed)
Patient called stating Losartan was called in although she is no longer on it.   She wants her new BP med, norvasc refilled.   Called [facility], Kayla states Losartan was sent over by Dr. Ninetta Lights on [address].

## 2021-09-24 ENCOUNTER — Ambulatory Visit: Payer: Medicare HMO | Admitting: Rehabilitative and Restorative Service Providers"

## 2021-09-24 ENCOUNTER — Encounter: Payer: Self-pay | Admitting: Physical Therapy

## 2021-09-24 ENCOUNTER — Encounter: Payer: Self-pay | Admitting: Rehabilitative and Restorative Service Providers"

## 2021-09-24 ENCOUNTER — Other Ambulatory Visit: Payer: Self-pay

## 2021-09-24 ENCOUNTER — Ambulatory Visit: Payer: Medicare HMO | Admitting: Physical Therapy

## 2021-09-24 DIAGNOSIS — R252 Cramp and spasm: Secondary | ICD-10-CM | POA: Diagnosis not present

## 2021-09-24 DIAGNOSIS — R2689 Other abnormalities of gait and mobility: Secondary | ICD-10-CM | POA: Diagnosis not present

## 2021-09-24 DIAGNOSIS — G8929 Other chronic pain: Secondary | ICD-10-CM | POA: Diagnosis not present

## 2021-09-24 DIAGNOSIS — R42 Dizziness and giddiness: Secondary | ICD-10-CM | POA: Diagnosis not present

## 2021-09-24 DIAGNOSIS — M6281 Muscle weakness (generalized): Secondary | ICD-10-CM

## 2021-09-24 DIAGNOSIS — M5442 Lumbago with sciatica, left side: Secondary | ICD-10-CM | POA: Diagnosis not present

## 2021-09-24 NOTE — Therapy (Signed)
[facility] @ [address] [place], [place], [address] Phone: [phone]   Fax:  [phone]  Physical Therapy Treatment  Patient Details  Name: Brenda Mcfarland MRN: [id number] Date of Birth: [birth year] Referring Provider (PT): Lauree Chandler, NP   Encounter Date: 09/24/2021   PT End of Session - 09/24/21 1238     Visit Number 8   3 PF, 5 vestibular   Number of Visits 20    Date for PT Re-Evaluation 10/03/21    Authorization Type Medicare + State Health    PT Start Time 1230    PT Stop Time 1310    PT Time Calculation (min) 40 min    Activity Tolerance Patient tolerated treatment well    Behavior During Therapy [facility] for tasks assessed/performed             Past Medical History:  Diagnosis Date   Abnormal EKG 10/23/2016   Arthritis 01/18/2020   Chronic fatigue 10/08/2015   Depression 01/18/2020   Diabetes mellitus ([place]) 10/08/2015   Formatting of this note might be different from the original. Overview:  last Hgb A1C 7.2/no blood sugars at home Formatting of this note might be different from the original. last Hgb A1C 7.2/no blood sugars at home   ESR raised 11/27/2016   Frequent urination 12/16/2018   Glaucoma 10/08/2015   High cholesterol    History of pancreatitis 07/11/2014   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/23/2016   Hypertension 10/08/2015   IFG (impaired fasting glucose) 10/24/2019   Incomplete emptying of bladder 11/30/2017   Knee pain, left 12/22/2016   Loss of memory 01/18/2020   Major depressive disorder in partial remission ([place]) 10/08/2015   Morbid obesity ([place]) 10/23/2016   Obesity (BMI 30-39.9) 11/10/2016   Obstructive sleep apnea 12/16/2018   Osteoarthritis of both knees 11/27/2016   Other insomnia 03/05/2017   Palpitations 10/23/2016   Positive ANA (antinuclear antibody) 10/23/2016   Sleep apnea 10/08/2015   Formatting of this note might be different from the original. Overview:  does not use CPAP Formatting of this note might  be different from the original. does not use CPAP   Sore neck 10/24/2019   Spondylosis of cervical spine with myelopathy 01/18/2020   Type 2 diabetes mellitus without complication, without long-term current use of insulin ([place]) 12/16/2018   Vitamin D deficiency 10/08/2015    Past Surgical History:  Procedure Laterality Date   APPENDECTOMY     age 78   CESAREAN SECTION     X 3   NECK SURGERY  2015   SPINE SURGERY  05/29/2015    There were no vitals filed for this visit.   Subjective Assessment - 09/24/21 1233     Subjective Pt reports at night, the pain keeps her awake.  She states that after she got up and started easing off. Pt reports that she has made approx 50% improvement in her dizziness since starting PT.    Pertinent History chronic low back pain, neck pain, previous neck surgery, recent COVID infections, brain fog, memory impairments, T2DM, OA bil knees, glaucoma    Patient Stated Goals be coordinated, stand up straight, walk around the block, work on balance, able to empty bladder    Currently in Pain? Yes    Pain Score 5     Pain Location Knee    Pain Orientation Left    Pain Descriptors / Indicators Dull    Pain Type Chronic pain  [facility] Adult PT Treatment/Exercise - 09/24/21 0001       Lumbar Exercises: Stretches   Passive Hamstring Stretch Right;Left;1 rep;20 seconds    Passive Hamstring Stretch Limitations Sciatic nerve stretch in sitting, performing ankle pumps for nerve 'flossing'.    Piriformis Stretch Right;Left;1 rep;20 seconds    Piriformis Stretch Limitations in sitting      Lumbar Exercises: Aerobic   Nustep L6 x6 min with PT present to discuss progress.      Manual Therapy   Manual Therapy Soft tissue mobilization;Myofascial release    Manual therapy comments to decrease muscle spasm and pain    Soft tissue mobilization to glutes, piriformis. lumbar multifidi    Myofascial Release skilled palpation to  locate trigger points in lumbar multifidi, glut and piriformis.  Performed both manual trigger point release and dry needling.             Vestibular Treatment/Exercise - 09/24/21 0001       Vestibular Treatment/Exercise   Vestibular Treatment Provided Canalith Repositioning    Canalith Repositioning Epley Manuever Left    Habituation Exercises Standing Horizontal Head Turns;Standing Vertical Head Turns       EPLEY MANUEVER LEFT   Number of Reps  1    Overall Response  Symptoms Resolved      Standing Horizontal Head Turns   Symptom Description  Ambulation down the hallway performing horizontal head turns.      Standing Vertical Head Turns   Symptom Description  Ambulation down the hallway performing vertical head turns.             Trigger Point Dry Needling - 09/24/21 0001     Consent Given? Yes    Education Handout Provided Previously provided    Muscles Treated Back/Hip Piriformis;Lumbar multifidi    Piriformis Response Twitch response elicited;Palpable increased muscle length    Lumbar multifidi Response Twitch response elicited;Palpable increased muscle length                     PT Short Term Goals - 09/10/21 1234       PT SHORT TERM GOAL #1   Title Ind with initial HEP    Status Achieved               PT Long Term Goals - 09/24/21 1322       PT LONG TERM GOAL #1   Title Ind with progressed HEP to improve outcomes.    Status On-going      PT LONG TERM GOAL #2   Title Improve walking tolerance to at least 1000' without limitation from back/leg pain or dizziness/imbalance.    Status On-going      PT LONG TERM GOAL #3   Title Patient will improve score on BERG to at least 50/56 to decrease risk of falls.    Status On-going      PT LONG TERM GOAL #4   Title Pt. will report 60% improvement in vestibular impairment.    Status Partially Met                   Plan - 09/24/21 1310     Clinical Impression Statement Brenda Mcfarland  continues to tolerate sessions well.  She had increased lumbar pain today, which caused some difficulty with Epley Maneuver.  Proceeded to treat with dry needling after, secondary to pain and pt reports following feeling looser.  Educated pt in sciatic nerve stretch, and she reported relief following.  Pt's  dizziness is improving and she states that she no longer feels like she needs to cancel outings with friends/family secondary to dizziness and fear of driving. Pt continues to require skilled PT to address her functional impairments.    PT Treatment/Interventions ADLs/Self Care Home Management;Cryotherapy;Electrical Stimulation;Iontophoresis 55m/ml Dexamethasone;Moist Heat;Traction;Ultrasound;Gait training;Stair training;Functional mobility training;Therapeutic activities;Therapeutic exercise;Balance training;Neuromuscular re-education;Patient/family education;Manual techniques;Passive range of motion;Dry needling;Taping;Joint Manipulations;Spinal Manipulations;Canalith Repostioning;Vestibular    PT Next Visit Plan assess and progress HEP, balance; PELVIC; f/u on double void; progress core and pelvic floor strength with cont stretches and bulging as tolerated;  Palpate bulge in sitting    PT Home Exercise Plan Access Code [email or web address]    Consulted and Agree with Plan of Care Patient             Patient will benefit from skilled therapeutic intervention in order to improve the following deficits and impairments:  Decreased activity tolerance, Decreased endurance, Decreased range of motion, Decreased strength, Increased fascial restricitons, Improper body mechanics, Pain, Postural dysfunction, Impaired flexibility, Increased muscle spasms, Decreased balance, Decreased coordination, Decreased safety awareness, Dizziness  Visit Diagnosis: Balance problem  Dizziness and giddiness  Muscle weakness (generalized)  Chronic left-sided low back pain with left-sided sciatica     Problem List Patient  Active Problem List   Diagnosis Date Noted   Paroxysmal SVT (supraventricular tachycardia) (HCC) 01/22/2020   Arthritis 01/18/2020   Depression 01/18/2020   Loss of memory 01/18/2020   Spondylosis of cervical spine with myelopathy 01/18/2020   IFG (impaired fasting glucose) 10/24/2019   Sore neck 10/24/2019   Frequent urination 12/16/2018   Obstructive sleep apnea 12/16/2018   Type 2 diabetes mellitus without complication, without long-term current use of insulin ([place] 12/16/2018   Incomplete emptying of bladder 11/30/2017   Other insomnia 03/05/2017   Knee pain, left 12/22/2016   ESR raised 11/27/2016   Osteoarthritis of both knees 11/27/2016   Obesity (BMI 30-39.9) 11/10/2016   Abnormal EKG 10/23/2016   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/23/2016   Morbid obesity ([place] 10/23/2016   Palpitations 10/23/2016   Positive ANA (antinuclear antibody) 10/23/2016   Hypertension 10/08/2015   Chronic fatigue 10/08/2015   Diabetes mellitus ([place] 10/08/2015   Glaucoma 10/08/2015   Major depressive disorder in partial remission ([place] 10/08/2015   Vitamin D deficiency 10/08/2015   Sleep apnea 10/08/2015   History of pancreatitis 07/11/2014    SJuel Burrow PT, DPT 09/24/2021, 1:26 PM  [place] [place] [address]: [phone]  Fax:  [phone] Name: Brenda PedregonMRN: [id number] of Birth: 5Mar 13, 1947

## 2021-09-24 NOTE — Patient Instructions (Addendum)
Double Voiding can be a very useful technique to help overcome incomplete emptying of your bladder.  Incomplete emptying of urine can result in leakage after using the bathroom and increase the risk of urinary tract infection.   Initial Void: When you first sit down to urinate, ensure optimal positioning for bladder emptying by following these guidelines for toileting posture: Sit on the toilet seat - dont hover over the seat Support your trunk by placing your hands on your knees or thighs Spread your knees and hips wide Position your feet flat on the floor or elevate feet on phone books, foot stool (Squatty Potty), or wrapped toilet paper rolls (if having knees above hips helps you empty) Lean forward from your hips Maintain the normal inward curve in your lower back   Repeated Void: After your initial void is complete, follow these movement patterns and attempt going to the bathroom again. Stand up Rotate your hips as if doing hula hoop in one direction Rotate using the same action in the other direction Rock your hips and pelvis back and forwards ("pelvic tilts") Rock your hips and pelvis side to side ("tail wag") Sit back down and repeat your voiding technique This technique can be repeated as many times as you choose to help you empty your bladder more effectively.   Bladder Irritants  Certain foods and beverages can be irritating to the bladder.  Avoiding these irritants may decrease your symptoms of urinary urgency, frequency or bladder pain.  Even reducing your intake can help with your symptoms.  Not everyone is sensitive to all bladder irritants, so you may consider focusing on one irritant at a time, removing or reducing your intake of that irritant for 7-10 days to see if this change helps your symptoms.  Water intake is also very important.  Below is a list of bladder irritants.  Drinks: alcohol, carbonated beverages, caffeinated beverages such as coffee and tea, drinks  with artificial sweeteners, citrus juices, apple juice, tomato juice  Foods: tomatoes and tomato based foods, spicy food, sugar and artificial sweeteners, vinegar, chocolate, raw onion, apples, citrus fruits, pineapple, cranberries, tomatoes, strawberries, plums, peaches, cantaloupe  Other: acidic urine (too concentrated) - see water intake info below  Substitutes you can try that are NOT irritating to the bladder: cooked onion, pears, papayas, sun-brewed decaf teas, watermelons, non-citrus herbal teas, apricots, kava and low-acid instant drinks (Postum).    WATER INTAKE: Remember to drink lots of water (aim for fluid intake of half your body weight with 2/3 of fluids being water).  You may be limiting fluids due to fear of leakage, but this can actually worsen urgency symptoms due to highly concentrated urine.  Water helps balance the pH of your urine so it doesn't become too acidic - acidic urine is a bladder irritant!

## 2021-09-24 NOTE — Therapy (Signed)
[facility] @ [address] [place], [place], [address] Phone: [phone]   Fax:  [phone]  Physical Therapy Treatment  Patient Details  Name: Brenda Mcfarland MRN: [id number] Date of Birth: [birth year] Referring Provider (PT): Lauree Chandler, NP   Encounter Date: 09/24/2021   PT End of Session - 09/24/21 1446     Visit Number 9   4 pF; 5vestibular   Number of Visits 20    Date for PT Re-Evaluation 10/03/21    Authorization Type Medicare + State Health    Progress Note Due on Visit 10    PT Start Time 1400    PT Stop Time 1446    PT Time Calculation (min) 46 min    Activity Tolerance Patient tolerated treatment well    Behavior During Therapy [facility] for tasks assessed/performed             Past Medical History:  Diagnosis Date   Abnormal EKG 10/23/2016   Arthritis 01/18/2020   Chronic fatigue 10/08/2015   Depression 01/18/2020   Diabetes mellitus ([place]) 10/08/2015   Formatting of this note might be different from the original. Overview:  last Hgb A1C 7.2/no blood sugars at home Formatting of this note might be different from the original. last Hgb A1C 7.2/no blood sugars at home   ESR raised 11/27/2016   Frequent urination 12/16/2018   Glaucoma 10/08/2015   High cholesterol    History of pancreatitis 07/11/2014   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/23/2016   Hypertension 10/08/2015   IFG (impaired fasting glucose) 10/24/2019   Incomplete emptying of bladder 11/30/2017   Knee pain, left 12/22/2016   Loss of memory 01/18/2020   Major depressive disorder in partial remission ([place]) 10/08/2015   Morbid obesity ([place]) 10/23/2016   Obesity (BMI 30-39.9) 11/10/2016   Obstructive sleep apnea 12/16/2018   Osteoarthritis of both knees 11/27/2016   Other insomnia 03/05/2017   Palpitations 10/23/2016   Positive ANA (antinuclear antibody) 10/23/2016   Sleep apnea 10/08/2015   Formatting of this note might be different from the original. Overview:  does not use  CPAP Formatting of this note might be different from the original. does not use CPAP   Sore neck 10/24/2019   Spondylosis of cervical spine with myelopathy 01/18/2020   Type 2 diabetes mellitus without complication, without long-term current use of insulin ([place]) 12/16/2018   Vitamin D deficiency 10/08/2015    Past Surgical History:  Procedure Laterality Date   APPENDECTOMY     age 62   CESAREAN SECTION     X 3   NECK SURGERY  2015   SPINE SURGERY  05/29/2015    There were no vitals filed for this visit.   Subjective Assessment - 09/24/21 1403     Subjective I couldn't sleep and went the bathroom a lot.  I don't know if I had to go but didn't want to have to wake up later.  I was having pain in my back, but had some needling before this session.    Pertinent History chronic low back pain, neck pain, previous neck surgery, recent COVID infections, brain fog, memory impairments, T2DM, OA bil knees, glaucoma    Patient Stated Goals be coordinated, stand up straight, walk around the block, work on balance, able to empty bladder    Currently in Pain? No/denies   nothing in the bladder or pelvis  [facility] Adult PT Treatment/Exercise - 09/24/21 1705       Self-Care   Other Self-Care Comments  double void practice and bladder journal; food irritants to bladder      Exercises   Exercises Lumbar      Lumbar Exercises: Stretches   Piriformis Stretch Right;Left;1 rep;20 seconds    Piriformis Stretch Limitations in sitting      Lumbar Exercises: Standing   Other Standing Lumbar Exercises kegel in standing with leaning      Lumbar Exercises: Seated   Other Seated Lumbar Exercises breathing and bulging with tactile cues      Manual Therapy   Manual Therapy Soft tissue mobilization;Myofascial release    Manual therapy comments to decrease muscle spasm and pain    Soft tissue mobilization to glutes, piriformis. lumbar multifidi    Myofascial  Release skilled palpation to locate trigger points in lumbar multifidi, glut and piriformis.  Performed both manual trigger point release and dry needling.                     PT Education - 09/24/21 1444     Education Details double voiding; added breathing    Person(s) Educated Patient    Methods Explanation;Demonstration;Tactile cues;Verbal cues;Handout    Comprehension Verbalized understanding;Returned demonstration              PT Short Term Goals - 09/10/21 1234       PT SHORT TERM GOAL #1   Title Ind with initial HEP    Status Achieved               PT Long Term Goals - 09/24/21 1322       PT LONG TERM GOAL #1   Title Ind with progressed HEP to improve outcomes.    Status On-going      PT LONG TERM GOAL #2   Title Improve walking tolerance to at least 1000' without limitation from back/leg pain or dizziness/imbalance.    Status On-going      PT LONG TERM GOAL #3   Title Patient will improve score on BERG to at least 50/56 to decrease risk of falls.    Status On-going      PT LONG TERM GOAL #4   Title Pt. will report 60% improvement in vestibular impairment.    Status Partially Met                   Plan - 09/24/21 1520     Clinical Impression Statement Today's treatment was focused on self care.  Discussed things that may irritate the bladder and filling out a food journal.  Pt needed review on exercise and educated on diaphragmatic breathing and bulging.  Pt was able to do the bulging and feel when she did it after verbal and tactile cues.  Pt still having a hard time feeling when she was engaging the pelvic floor and bulging.  Pt will benefit from skilled PT to continue to address strength and coordination    PT Treatment/Interventions ADLs/Self Care Home Management;Cryotherapy;Electrical Stimulation;Iontophoresis 27m/ml Dexamethasone;Moist Heat;Traction;Ultrasound;Gait training;Stair training;Functional mobility training;Therapeutic  activities;Therapeutic exercise;Balance training;Neuromuscular re-education;Patient/family education;Manual techniques;Passive range of motion;Dry needling;Taping;Joint Manipulations;Spinal Manipulations;Canalith Repostioning;Vestibular    PT Next Visit Plan assess and progress HEP, balance; PELVIC; f/u on double void; progress core and pelvic floor strength with cont stretches and bulging as tolerated;  re-assess pelvic floor    PT Home Exercise Plan Access Code [email or web address]    Consulted and Agree with  Plan of Care Patient             Patient will benefit from skilled therapeutic intervention in order to improve the following deficits and impairments:  Decreased activity tolerance, Decreased endurance, Decreased range of motion, Decreased strength, Increased fascial restricitons, Improper body mechanics, Pain, Postural dysfunction, Impaired flexibility, Increased muscle spasms, Decreased balance, Decreased coordination, Decreased safety awareness, Dizziness  Visit Diagnosis: Muscle weakness (generalized)  Cramp and spasm     Problem List Patient Active Problem List   Diagnosis Date Noted   Paroxysmal SVT (supraventricular tachycardia) ([place]) 01/22/2020   Arthritis 01/18/2020   Depression 01/18/2020   Loss of memory 01/18/2020   Spondylosis of cervical spine with myelopathy 01/18/2020   IFG (impaired fasting glucose) 10/24/2019   Sore neck 10/24/2019   Frequent urination 12/16/2018   Obstructive sleep apnea 12/16/2018   Type 2 diabetes mellitus without complication, without long-term current use of insulin ([place]) 12/16/2018   Incomplete emptying of bladder 11/30/2017   Other insomnia 03/05/2017   Knee pain, left 12/22/2016   ESR raised 11/27/2016   Osteoarthritis of both knees 11/27/2016   Obesity (BMI 30-39.9) 11/10/2016   Abnormal EKG 10/23/2016   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/23/2016   Morbid obesity ([place]) 10/23/2016   Palpitations 10/23/2016   Positive ANA (antinuclear antibody)  10/23/2016   Hypertension 10/08/2015   Chronic fatigue 10/08/2015   Diabetes mellitus ([place]) 10/08/2015   Glaucoma 10/08/2015   Major depressive disorder in partial remission ([place]) 10/08/2015   Vitamin D deficiency 10/08/2015   Sleep apnea 10/08/2015   History of pancreatitis 07/11/2014    Camillo Flaming Masato Pettie, PT 09/24/2021, 5:09 PM  [facility] @ [address] [place], [place], [address] Phone: [phone]   Fax:  [phone]  Name: Brenda Mcfarland MRN: [id number] Date of Birth: 01/01/1946

## 2021-09-25 ENCOUNTER — Telehealth (INDEPENDENT_AMBULATORY_CARE_PROVIDER_SITE_OTHER): Payer: Medicare HMO | Admitting: Nurse Practitioner

## 2021-09-25 DIAGNOSIS — N3281 Overactive bladder: Secondary | ICD-10-CM

## 2021-09-25 DIAGNOSIS — I10 Essential (primary) hypertension: Secondary | ICD-10-CM

## 2021-09-25 MED ORDER — HYDRALAZINE HCL 10 MG PO TABS: 10.0000 mg | ORAL_TABLET | Freq: Three times a day (TID) | ORAL | 1 refills | Status: DC

## 2021-09-25 NOTE — Progress Notes (Signed)
° ° °Careteam: °Patient Care Team: °Eubanks, Jessica K, NP as PCP - General (Geriatric Medicine) °Tobb, Kardie, DO as PCP - Cardiology (Cardiology) °Rodriguez, Ruben Torrrealba, MD as Referring Physician (Orthopedic Surgery) °Krasowski, Robert J, MD as Consulting Physician (Cardiology) ° °Advanced Directive information °  ° °Allergies  °Allergen Reactions  ° Amlodipine Swelling  ° Aripiprazole Other (See Comments)  °  Tongue twisted, lips curled up °Tongue twisted, lips curled up °  ° Carvedilol Other (See Comments)  °  Nightmares °Nightmares °  ° Irbesartan   °  dizziness  ° Losartan   °  dizzy  ° Paroxetine Hcl Other (See Comments)  °  . °. °  ° Tizanidine Other (See Comments)  °  Syncope °Syncope °  ° Tramadol Other (See Comments)  °  Unknown  ° Zolpidem Other (See Comments)  °  Sleepwalking °Sleepwalking °  ° Zyrtec [Cetirizine Hcl]   °  Dizziness  ° ° °Chief Complaint  °Patient presents with  ° Follow-up  °  Follow up on blood pressure. Blood pressure not under control. BP readings have been 176/84,156/89, 171/83. Blood pressure is lower in the morning.Patient has been having dizziness. She has been doing physical therapy for vertigo..Patient would like to discuss losartan.  ° ° ° °HPI: Patient is a 76 y.o. female for follow up blood pressure.  °When she takes her blood pressure in the morning blood pressure in the 130s.  °Reports as the afternoon continues to get higher. Prior to visit blood pressure was 176/84 °Reports dizziness has improved (she is also doing vestibular rehab).  °Reports her ankles are swollen and have been for about 1 week.  °She continues to eat food that others have prepared and unknown salt content.  ° °She is doing dry needling for her back which has been great.  ° °Continues to go to physical therapy for overactive bladder in combination with vesicare. She slept 6 hours last night which is the most she has slept in years.  ° °Review of Systems:  °Review of Systems  °Constitutional:   Negative for chills and fever.  °Respiratory:  Negative for shortness of breath.   °Cardiovascular:  Positive for leg swelling. Negative for chest pain, palpitations and orthopnea.  °Gastrointestinal:  Negative for abdominal pain, heartburn, nausea and vomiting.  °Neurological:  Positive for dizziness. Negative for headaches.  ° °Past Medical History:  °Diagnosis Date  ° Abnormal EKG 10/23/2016  ° Arthritis 01/18/2020  ° Chronic fatigue 10/08/2015  ° Depression 01/18/2020  ° Diabetes mellitus (HCC) 10/08/2015  ° Formatting of this note might be different from the original. Overview:  last Hgb A1C 7.2/no blood sugars at home Formatting of this note might be different from the original. last Hgb A1C 7.2/no blood sugars at home  ° ESR raised 11/27/2016  ° Frequent urination 12/16/2018  ° Glaucoma 10/08/2015  ° High cholesterol   ° History of pancreatitis 07/11/2014  ° 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/23/2016  ° Hypertension 10/08/2015  ° IFG (impaired fasting glucose) 10/24/2019  ° Incomplete emptying of bladder 11/30/2017  ° Knee pain, left 12/22/2016  ° Loss of memory 01/18/2020  ° Major depressive disorder in partial remission (HCC) 10/08/2015  ° Morbid obesity (HCC) 10/23/2016  ° Obesity (BMI 30-39.9) 11/10/2016  ° Obstructive sleep apnea 12/16/2018  ° Osteoarthritis of both knees 11/27/2016  ° Other insomnia 03/05/2017  ° Palpitations 10/23/2016  ° Positive ANA (antinuclear antibody) 10/23/2016  ° Sleep apnea 10/08/2015  ° Formatting of this note might   be different from the original. Overview:  does not use CPAP Formatting of this note might be different from the original. does not use CPAP  ° Sore neck 10/24/2019  ° Spondylosis of cervical spine with myelopathy 01/18/2020  ° Type 2 diabetes mellitus without complication, without long-term current use of insulin (HCC) 12/16/2018  ° Vitamin D deficiency 10/08/2015  ° °Past Surgical History:  °Procedure Laterality Date  ° APPENDECTOMY    ° age 9  ° CESAREAN SECTION    ° X 3  ° NECK SURGERY  2015  ° SPINE  SURGERY  05/29/2015  ° °Social History: °  reports that she has quit smoking. Her smoking use included cigarettes. She has a 10.00 pack-year smoking history. She has never used smokeless tobacco. She reports that she does not currently use alcohol. She reports that she does not currently use drugs. ° °Family History  °Problem Relation Age of Onset  ° Diabetes Mother   ° Multiple sclerosis Mother   ° Heart disease Father   ° Heart Problems Father   ° Migraines Daughter   ° Thyroid disease Daughter   ° Migraines Daughter   ° ° °Medications: °Patient's Medications  °New Prescriptions  ° No medications on file  °Previous Medications  ° AMLODIPINE (NORVASC) 2.5 MG TABLET    Take 2 tablets (5 mg total) by mouth daily. Take two tablets daily  ° LATANOPROST (XALATAN) 0.005 % OPHTHALMIC SOLUTION    Place 1 drop into both eyes at bedtime.  ° MAGNESIUM GLYCINATE PO    Take 350 mg by mouth.  ° MELATONIN 5 MG TABS    Take by mouth at bedtime. 1-2 tablets  ° OVER THE COUNTER MEDICATION    daily. Essential Oils  ° SOLIFENACIN (VESICARE) 5 MG TABLET    Take 1 tablet (5 mg total) by mouth daily.  ° TRAZODONE (DESYREL) 100 MG TABLET    Take 1.5 tablets (150 mg total) by mouth at bedtime.  °Modified Medications  ° No medications on file  °Discontinued Medications  ° LORATADINE (CLARITIN) 10 MG TABLET    Take 1 tablet (10 mg total) by mouth daily.  ° ° °Physical Exam: ° °There were no vitals filed for this visit. °There is no height or weight on file to calculate BMI. °Wt Readings from Last 3 Encounters:  °07/11/21 239 lb 12.8 oz (108.8 kg)  °05/02/21 234 lb 9.6 oz (106.4 kg)  °03/12/21 232 lb 9.6 oz (105.5 kg)  ° ° °Physical Exam °Neurological:  °   Mental Status: She is alert and oriented to person, place, and time.  °Psychiatric:     °   Mood and Affect: Mood normal.  ° ° °Labs reviewed: °Basic Metabolic Panel: °Recent Labs  °  03/12/21 °1601  °NA 139  °K 3.9  °CL 103  °CO2 26  °GLUCOSE 126  °BUN 19  °CREATININE 0.74  °CALCIUM 9.1   ° °Liver Function Tests: °Recent Labs  °  03/12/21 °1601  °AST 15  °ALT 17  °BILITOT 0.3  °PROT 6.7  ° °No results for input(s): LIPASE, AMYLASE in the last 8760 hours. °No results for input(s): AMMONIA in the last 8760 hours. °CBC: °Recent Labs  °  03/12/21 °1601  °WBC 6.0  °NEUTROABS 3,300  °HGB 13.4  °HCT 40.4  °MCV 94.4  °PLT 244  ° °Lipid Panel: °No results for input(s): CHOL, HDL, LDLCALC, TRIG, CHOLHDL, LDLDIRECT in the last 8760 hours. °TSH: °No results for input(s): TSH in the   the last 8760 hours. A1C: Lab Results  Component Value Date   HGBA1C 5.9 (H) 03/12/2021     Assessment/Plan 1. Primary hypertension -elevated blood pressure noted more in the evening, encouraged DASH diet. Will continue norvasc 5 mg daily - hydrALAZINE (APRESOLINE) 10 MG tablet; Take 1 tablet (10 mg total) by mouth 3 (three) times daily.  Dispense: 90 tablet; Refill: 1 -continue to monitor bp goal, <140/90.  2. Overactive bladder -ongoing but improved with current treatment    Next appt: 11/17/2021 Janett Billow K. Harle Battiest  [facility] [phone]    Virtual Visit via video   I connected with patient on 09/25/21 at  3:45 PM EST by video mychart and verified that I am speaking with the correct person using two identifiers.  Location: Patient: home Provider: [facility]   I discussed the limitations, risks, security and privacy concerns of performing an evaluation and management service by telephone and the availability of in person appointments. I also discussed with the patient that there may be a patient responsible charge related to this service. The patient expressed understanding and agreed to proceed.   I discussed the assessment and treatment plan with the patient. The patient was provided an opportunity to ask questions and all were answered. The patient agreed with the plan and demonstrated an understanding of the instructions.   The patient was advised to call back  or seek an in-person evaluation if the symptoms worsen or if the condition fails to improve as anticipated.  I provided 15 minutes of non-face-to-face time during this encounter.  Carlos American. Harle Battiest Avs printed and mailed

## 2021-09-25 NOTE — Progress Notes (Signed)
This service is provided via telemedicine  No vital signs collected/recorded due to the encounter was a telemedicine visit.   Location of patient (ex: home, work):  Home  Patient consents to a telephone visit:  Yes, see encounter dated 03/27/2021  Location of the provider (ex: office, home):  [facility]  Name of any referring provider:  N/A  Names of all persons participating in the telemedicine service and their role in the encounter:  Abbey Chatters, Nurse Practitioner, Elveria Royals, CMA, and patient.   Time spent on call:  7 minutes with medical assistant

## 2021-09-25 NOTE — Patient Instructions (Addendum)
Decrease sodium where you can, dash diet   Continue on norvasc 5 mg by mouth daily  ADD hydralazine 10 mg by mouth three times daily to help with blood pressure

## 2021-10-01 ENCOUNTER — Encounter: Payer: Self-pay | Admitting: Rehabilitative and Restorative Service Providers"

## 2021-10-01 ENCOUNTER — Ambulatory Visit: Payer: Medicare HMO | Attending: Nurse Practitioner | Admitting: Rehabilitative and Restorative Service Providers"

## 2021-10-01 ENCOUNTER — Other Ambulatory Visit: Payer: Self-pay

## 2021-10-01 ENCOUNTER — Encounter: Payer: Medicare HMO | Admitting: Physical Therapy

## 2021-10-01 DIAGNOSIS — M6281 Muscle weakness (generalized): Secondary | ICD-10-CM | POA: Diagnosis not present

## 2021-10-01 DIAGNOSIS — R252 Cramp and spasm: Secondary | ICD-10-CM | POA: Insufficient documentation

## 2021-10-01 DIAGNOSIS — R42 Dizziness and giddiness: Secondary | ICD-10-CM | POA: Insufficient documentation

## 2021-10-01 DIAGNOSIS — G8929 Other chronic pain: Secondary | ICD-10-CM | POA: Insufficient documentation

## 2021-10-01 DIAGNOSIS — R2689 Other abnormalities of gait and mobility: Secondary | ICD-10-CM | POA: Insufficient documentation

## 2021-10-01 DIAGNOSIS — M5442 Lumbago with sciatica, left side: Secondary | ICD-10-CM | POA: Insufficient documentation

## 2021-10-01 NOTE — Therapy (Signed)
[facility] @ [place], [place], [address] Phone: [phone]   Fax:  [phone]  Physical Therapy Treatment and Re-eval  Patient Details  Name: Brenda Mcfarland MRN: [id number] Date of Birth: [birth year] Referring Provider (PT): Lauree Chandler, NP   Encounter Date: 10/01/2021   PT End of Session - 10/01/21 1234     Visit Number 10   4 PF, 6 vestibular   Date for PT Re-Evaluation 11/28/21    Authorization Type Medicare + State Health    Progress Note Due on Visit 20    PT Start Time 1230    PT Stop Time 1325    PT Time Calculation (min) 55 min    Activity Tolerance Patient tolerated treatment well    Behavior During Therapy [facility] for tasks assessed/performed             Past Medical History:  Diagnosis Date   Abnormal EKG 10/23/2016   Arthritis 01/18/2020   Chronic fatigue 10/08/2015   Depression 01/18/2020   Diabetes mellitus ([place]) 10/08/2015   Formatting of this note might be different from the original. Overview:  last Hgb A1C 7.2/no blood sugars at home Formatting of this note might be different from the original. last Hgb A1C 7.2/no blood sugars at home   ESR raised 11/27/2016   Frequent urination 12/16/2018   Glaucoma 10/08/2015   High cholesterol    History of pancreatitis 07/11/2014   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/23/2016   Hypertension 10/08/2015   IFG (impaired fasting glucose) 10/24/2019   Incomplete emptying of bladder 11/30/2017   Knee pain, left 12/22/2016   Loss of memory 01/18/2020   Major depressive disorder in partial remission ([place]) 10/08/2015   Morbid obesity ([place]) 10/23/2016   Obesity (BMI 30-39.9) 11/10/2016   Obstructive sleep apnea 12/16/2018   Osteoarthritis of both knees 11/27/2016   Other insomnia 03/05/2017   Palpitations 10/23/2016   Positive ANA (antinuclear antibody) 10/23/2016   Sleep apnea 10/08/2015   Formatting of this note might be different from the original. Overview:  does not use CPAP  Formatting of this note might be different from the original. does not use CPAP   Sore neck 10/24/2019   Spondylosis of cervical spine with myelopathy 01/18/2020   Type 2 diabetes mellitus without complication, without long-term current use of insulin ([place]) 12/16/2018   Vitamin D deficiency 10/08/2015    Past Surgical History:  Procedure Laterality Date   APPENDECTOMY     age 76   CESAREAN SECTION     X 3   NECK SURGERY  2015   SPINE SURGERY  05/29/2015    There were no vitals filed for this visit.   Subjective Assessment - 10/01/21 1239     Subjective Pt reports feeling like she has made approx 60% improvement with dizziness since starting therapy.  Pt continues to perform HEP.    Pertinent History chronic low back pain, neck pain, previous neck surgery, recent COVID infections, brain fog, memory impairments, T2DM, OA bil knees, glaucoma    Patient Stated Goals be coordinated, stand up straight, walk around the block, work on balance, able to empty bladder    Currently in Pain? Yes    Pain Score 5     Pain Location Hip    Pain Orientation Left    Pain Descriptors / Indicators Cramping    Pain Type Chronic pain                [facility]  PT Assessment - 10/01/21 0001       Assessment   Medical Diagnosis Vestibular Impairment and L hip pain    Referring Provider (PT) Lauree Chandler, NP    Hand Dominance Right      Balance Screen   Has the patient fallen in the past 6 months No    Has the patient had a decrease in activity level because of a fear of falling?  No      Prior Function   Level of Independence Independent    Vocation Retired    Leisure enjoys socializing with friends, quilting      Cognition   Overall Cognitive Status Within Functional Limits for tasks assessed      Strength   Overall Strength Comments L hip strength of grossly 4/5 throuhgout                           [facility] Adult PT Treatment/Exercise - 10/01/21 0001        Standardized Balance Assessment   Standardized Balance Assessment Berg Balance Test      Berg Balance Test   Sit to Stand Able to stand without using hands and stabilize independently    Standing Unsupported Able to stand safely 2 minutes    Sitting with Back Unsupported but Feet Supported on Floor or Stool Able to sit safely and securely 2 minutes    Stand to Sit Sits safely with minimal use of hands    Transfers Able to transfer safely, minor use of hands    Standing Unsupported with Eyes Closed Able to stand 10 seconds safely    Standing Ubsupported with Feet Together Able to place feet together independently and stand 1 minute safely    From Standing, Reach Forward with Outstretched Arm Can reach forward >12 cm safely (5")    From Standing Position, Pick up Object from Floor Able to pick up shoe safely and easily    From Standing Position, Turn to Look Behind Over each Shoulder Looks behind one side only/other side shows less weight shift    Turn 360 Degrees Able to turn 360 degrees safely in 4 seconds or less    Standing Unsupported, Alternately Place Feet on Step/Stool Able to stand independently and complete 8 steps >20 seconds    Standing Unsupported, One Foot in Front Able to plae foot ahead of the other independently and hold 30 seconds    Standing on One Leg Able to lift leg independently and hold equal to or more than 3 seconds    Total Score 50      Lumbar Exercises: Stretches   Passive Hamstring Stretch Right;Left;1 rep;20 seconds    Passive Hamstring Stretch Limitations Sciatic nerve stretch in sitting, performing ankle pumps for nerve 'flossing'.    Lower Trunk Rotation 5 reps;10 seconds    Pelvic Tilt 10 reps;5 seconds    Piriformis Stretch Right;Left;2 reps;20 seconds    Piriformis Stretch Limitations in sitting      Lumbar Exercises: Aerobic   Nustep L6 x6 min with PT present to discuss progress.      Lumbar Exercises: Supine   Bridge 10 reps      Manual Therapy    Manual Therapy Soft tissue mobilization;Myofascial release    Manual therapy comments to decrease muscle spasm and pain    Soft tissue mobilization to glutes, piriformis. lumbar multifidi    Myofascial Release skilled palpation to locate trigger points in lumbar  multifidi, glut and piriformis.  Performed both manual trigger point release and dry needling.              Trigger Point Dry Needling - 10/01/21 0001     Consent Given? Yes    Education Handout Provided Previously provided    Muscles Treated Back/Hip Piriformis;Lumbar multifidi    Piriformis Response Twitch response elicited;Palpable increased muscle length    Lumbar multifidi Response Twitch response elicited;Palpable increased muscle length                     PT Short Term Goals - 10/01/21 1251       PT SHORT TERM GOAL #1   Title Ind with initial HEP    Status Achieved      PT SHORT TERM GOAL #2   Title Complete DGI/Berg to check for balance impairments.    Status Achieved               PT Long Term Goals - 10/01/21 1251       PT LONG TERM GOAL #1   Title Ind with progressed HEP to improve outcomes.    Status On-going      PT LONG TERM GOAL #2   Title Improve walking tolerance to at least 1000' without limitation from back/leg pain or dizziness/imbalance.    Status On-going      PT LONG TERM GOAL #3   Title Patient will improve score on BERG to at least 50/56 to decrease risk of falls.    Status Achieved   50/56     PT LONG TERM GOAL #4   Title Pt. will report 60% improvement in vestibular impairment.    Status Achieved      PT LONG TERM GOAL #5   Title PELVIC: ind with pelvic HEP    Status On-going      PT LONG TERM GOAL #6   Title Decreased nocturia to 1x/ night    Status On-going      PT LONG TERM GOAL #7   Title Pt will be able to empty bladder in less than one minute due to improved muscle tone and ability to relax    Status On-going                   Plan  - 10/01/21 1328     Clinical Impression Statement Ms Manders continues to make progress towards goal related activities.  She has opted to hold on pelvic PT and focus on vestibular/ortho secondary to her insurance visit limits.  Pt reports having 60% improvement in vestibular symptoms and states that she now feels that she can go out and drive and socialize again without fear of being dizzy.  Pt continues to have L hip pain and would like to primarily focus sessions on her hip pain with some balance and vestibular training throughout.  Pt has met some goals, but continues with L hip pain, weakness, and difficulty walking.  Pt would benefit from continued skilled PT of 2x/week for 8 additional weeks to address her functional impairments.    Personal Factors and Comorbidities Comorbidity 3+    Comorbidities history neck pain/surgery, tinnitis, chronic LBP, recent COVID infection with brain fog, glaucoma, obesity, bil knee OA, T2DM.    Stability/Clinical Decision Making Evolving/Moderate complexity    Clinical Decision Making Moderate    Rehab Potential Good    PT Frequency 2x / week    PT Duration 8 weeks  PT Treatment/Interventions ADLs/Self Care Home Management;Cryotherapy;Electrical Stimulation;Iontophoresis 70m/ml Dexamethasone;Moist Heat;Traction;Ultrasound;Gait training;Stair training;Functional mobility training;Therapeutic activities;Therapeutic exercise;Balance training;Neuromuscular re-education;Patient/family education;Manual techniques;Passive range of motion;Dry needling;Taping;Joint Manipulations;Spinal Manipulations;Canalith Repostioning;Vestibular    PT Next Visit Plan assess and progress HEP, balance; PELVIC; f/u on double void; progress core and pelvic floor strength with cont stretches and bulging as tolerated;  re-assess pelvic floor    Consulted and Agree with Plan of Care Patient             Patient will benefit from skilled therapeutic intervention in order to improve the  following deficits and impairments:  Decreased activity tolerance, Decreased endurance, Decreased range of motion, Decreased strength, Increased fascial restricitons, Improper body mechanics, Pain, Postural dysfunction, Impaired flexibility, Increased muscle spasms, Decreased balance, Decreased coordination, Decreased safety awareness, Dizziness  Visit Diagnosis: Balance problem - Plan: PT plan of care cert/re-cert  Dizziness and giddiness - Plan: PT plan of care cert/re-cert  Muscle weakness (generalized) - Plan: PT plan of care cert/re-cert  Chronic left-sided low back pain with left-sided sciatica - Plan: PT plan of care cert/re-cert  Cramp and spasm - Plan: PT plan of care cert/re-cert  Other abnormalities of gait and mobility - Plan: PT plan of care cert/re-cert     Problem List Patient Active Problem List   Diagnosis Date Noted   Paroxysmal SVT (supraventricular tachycardia) ([place] 01/22/2020   Arthritis 01/18/2020   Depression 01/18/2020   Loss of memory 01/18/2020   Spondylosis of cervical spine with myelopathy 01/18/2020   IFG (impaired fasting glucose) 10/24/2019   Sore neck 10/24/2019   Frequent urination 12/16/2018   Obstructive sleep apnea 12/16/2018   Type 2 diabetes mellitus without complication, without long-term current use of insulin ([place] 12/16/2018   Incomplete emptying of bladder 11/30/2017   Other insomnia 03/05/2017   Knee pain, left 12/22/2016   ESR raised 11/27/2016   Osteoarthritis of both knees 11/27/2016   Obesity (BMI 30-39.9) 11/10/2016   Abnormal EKG 10/23/2016   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/23/2016   Morbid obesity ([place] 10/23/2016   Palpitations 10/23/2016   Positive ANA (antinuclear antibody) 10/23/2016   Hypertension 10/08/2015   Chronic fatigue 10/08/2015   Diabetes mellitus ([place] 10/08/2015   Glaucoma 10/08/2015   Major depressive disorder in partial remission ([place] 10/08/2015   Vitamin D deficiency 10/08/2015   Sleep apnea 10/08/2015    History of pancreatitis 07/11/2014    SJuel Burrow PT, DPT 10/01/2021, 1:37 PM  [place] [place] [address]Phone: [phone]  Fax:  [phone] Name: CShelva HetzerMRN: [id number] of Birth: 503/18/1947

## 2021-10-06 DIAGNOSIS — L821 Other seborrheic keratosis: Secondary | ICD-10-CM | POA: Diagnosis not present

## 2021-10-06 DIAGNOSIS — L820 Inflamed seborrheic keratosis: Secondary | ICD-10-CM | POA: Diagnosis not present

## 2021-10-08 ENCOUNTER — Encounter: Payer: Medicare HMO | Admitting: Rehabilitative and Restorative Service Providers"

## 2021-10-08 ENCOUNTER — Encounter: Payer: Medicare HMO | Admitting: Physical Therapy

## 2021-10-15 ENCOUNTER — Encounter: Payer: Medicare HMO | Admitting: Rehabilitative and Restorative Service Providers"

## 2021-10-15 ENCOUNTER — Encounter: Payer: Medicare HMO | Admitting: Physical Therapy

## 2021-10-22 ENCOUNTER — Encounter: Payer: Medicare HMO | Admitting: Rehabilitative and Restorative Service Providers"

## 2021-10-22 ENCOUNTER — Encounter: Payer: Medicare HMO | Admitting: Physical Therapy

## 2021-10-27 ENCOUNTER — Telehealth: Payer: Self-pay

## 2021-10-27 NOTE — Telephone Encounter (Signed)
Patient called stating that Sunday before last that she had severe diarrhea the lasted until Friday Feb. 24. She has not had a bowel movement since then. She would like to know if it is ok for her to take clearlax (exlax) She is still having stomach discomfort and stomach swollen. She would also like to know about getting hip xray. Please advise.  Message routed to Sherrie Mustache, NP

## 2021-10-28 NOTE — Telephone Encounter (Signed)
Would recommend using miralax daily, to increase fiber and water in diet.  Will need evaluation before any xrays can be ordered

## 2021-10-28 NOTE — Telephone Encounter (Signed)
Called and discussed recommendations from Abbey Chatters, NP with patient. She verbalized her understanding.

## 2021-11-12 ENCOUNTER — Telehealth: Payer: Self-pay | Admitting: Nurse Practitioner

## 2021-11-12 ENCOUNTER — Other Ambulatory Visit: Payer: Self-pay | Admitting: *Deleted

## 2021-11-12 MED ORDER — AMLODIPINE BESYLATE 5 MG PO TABS: 5.0000 mg | ORAL_TABLET | Freq: Every day | ORAL | 1 refills | Status: DC

## 2021-11-12 NOTE — Telephone Encounter (Signed)
Pt called concerned with stomach pain that occurs after eating. She has had pancreatitis in the past & is concerned it could be something similar. ? ?Brenda Mcfarland is asking if she could get rx for pain meds to thru until appt on 3/20 ? ?Please advise ?Brenda Mcfarland ?

## 2021-11-12 NOTE — Telephone Encounter (Signed)
Patient requested refill.  Pended Rx and sent to Jessica for approval due to HIGH ALERT Warning.  

## 2021-11-13 NOTE — Telephone Encounter (Signed)
She will need in office visit with exam and labs for evaluation  ?

## 2021-11-17 ENCOUNTER — Encounter: Payer: Self-pay | Admitting: Nurse Practitioner

## 2021-11-17 ENCOUNTER — Ambulatory Visit
Admission: RE | Admit: 2021-11-17 | Discharge: 2021-11-17 | Disposition: A | Discharge status: Home / Self Care | Payer: Medicare HMO | Source: Ambulatory Visit | Attending: Nurse Practitioner | Admitting: Nurse Practitioner

## 2021-11-17 ENCOUNTER — Ambulatory Visit (INDEPENDENT_AMBULATORY_CARE_PROVIDER_SITE_OTHER): Payer: Medicare HMO | Admitting: Nurse Practitioner

## 2021-11-17 ENCOUNTER — Other Ambulatory Visit: Payer: Self-pay

## 2021-11-17 VITALS — BP 138/76 | HR 79 | Temp 97.3°F | Ht 67.0 in | Wt 242.0 lb

## 2021-11-17 DIAGNOSIS — R42 Dizziness and giddiness: Secondary | ICD-10-CM

## 2021-11-17 DIAGNOSIS — K5904 Chronic idiopathic constipation: Secondary | ICD-10-CM | POA: Diagnosis not present

## 2021-11-17 DIAGNOSIS — E785 Hyperlipidemia, unspecified: Secondary | ICD-10-CM | POA: Diagnosis not present

## 2021-11-17 DIAGNOSIS — E119 Type 2 diabetes mellitus without complications: Secondary | ICD-10-CM | POA: Diagnosis not present

## 2021-11-17 DIAGNOSIS — Z1159 Encounter for screening for other viral diseases: Secondary | ICD-10-CM

## 2021-11-17 DIAGNOSIS — I10 Essential (primary) hypertension: Secondary | ICD-10-CM

## 2021-11-17 DIAGNOSIS — R109 Unspecified abdominal pain: Secondary | ICD-10-CM | POA: Diagnosis not present

## 2021-11-17 DIAGNOSIS — E1169 Type 2 diabetes mellitus with other specified complication: Secondary | ICD-10-CM

## 2021-11-17 DIAGNOSIS — G4700 Insomnia, unspecified: Secondary | ICD-10-CM

## 2021-11-17 DIAGNOSIS — K5900 Constipation, unspecified: Secondary | ICD-10-CM | POA: Diagnosis not present

## 2021-11-17 NOTE — Patient Instructions (Addendum)
To decrease melatonin to 1 mg by mouth at bedtime ?Make sure to get up every morning at same time ?Get outside for 30 mins daily ?Increase physical activity ? ?To get xray at [facility] imaging. (Back and abdomen)  ? ?

## 2021-11-17 NOTE — Progress Notes (Signed)
? ? ?Careteam: ?Patient Care Team: ?Lauree Chandler, NP as PCP - General (Geriatric Medicine) ?Berniece Salines, DO as PCP - Cardiology (Cardiology) ?Murlean Iba, MD as Referring Physician (Orthopedic Surgery) ?Park Liter, MD as Consulting Physician (Cardiology) ? ?PLACE OF SERVICE:  ?[facility]  ?Advanced Directive information ?Does Patient Have a Medical Advance Directive?: Yes, Type of Advance Directive: [place];Living will, Does patient want to make changes to medical advance directive?: No - Patient declined ? ?Allergies  ?Allergen Reactions  ? Amlodipine Swelling  ? Aripiprazole Other (See Comments)  ?  Tongue twisted, lips curled up ?Tongue twisted, lips curled up ?  ? Carvedilol Other (See Comments)  ?  Nightmares ?Nightmares ?  ? Irbesartan   ?  dizziness  ? Losartan   ?  dizzy  ? Paroxetine Hcl Other (See Comments)  ?  . ?. ?  ? Tizanidine Other (See Comments)  ?  Syncope ?Syncope ?  ? Tramadol Other (See Comments)  ?  Unknown  ? Zolpidem Other (See Comments)  ?  Sleepwalking ?Sleepwalking ?  ? Zyrtec [Cetirizine Hcl]   ?  Dizziness  ? ? ?Chief Complaint  ?Patient presents with  ? Medical Management of Chronic Issues  ?  4 month follow-up. Foot exam today. Discuss need for hep c screening, a1c, and eye exam. Patient c/o stomach pain off/on x several weeks. Discuss hydralazine   ? ? ? ?HPI: Patient is a 76 y.o. female for follow up.  ?Reports stomach pain due to hydralazine. Reports diarrhea as well while on hydralazine .  ?In the past she had an issue with constipation high up in her abdomen noted during xray, thinking she is having a similar occurrence of this.  ?She reports she has the sensation she has to go to the bathroom and only a small amount comes out.  ? ?Dizziness- has significantly improved since she underwent therapy ? ?Reports hip/back pain is worse, now going down left leg. Therapy did not help. She was told the therapy would have been covered  but got a huge bill.  ? ?DM- has been awhile since she had an eye exam.  ?Review of Systems:  ?Review of Systems  ?Constitutional:  Negative for chills, fever and weight loss.  ?HENT:  Negative for tinnitus.   ?Respiratory:  Negative for cough, sputum production and shortness of breath.   ?Cardiovascular:  Negative for chest pain, palpitations and leg swelling.  ?Gastrointestinal:  Negative for abdominal pain, constipation, diarrhea and heartburn.  ?Genitourinary:  Negative for dysuria, frequency and urgency.  ?Musculoskeletal:  Positive for back pain. Negative for falls, joint pain and myalgias.  ?Skin: Negative.   ?Neurological:  Negative for dizziness and headaches.  ?Psychiatric/Behavioral:  Negative for depression and memory loss. The patient does not have insomnia.   ? ?Past Medical History:  ?Diagnosis Date  ? Abnormal EKG 10/23/2016  ? Arthritis 01/18/2020  ? Chronic fatigue 10/08/2015  ? Depression 01/18/2020  ? Diabetes mellitus ([place]) 10/08/2015  ? Formatting of this note might be different from the original. Overview:  last Hgb A1C 7.2/no blood sugars at home Formatting of this note might be different from the original. last Hgb A1C 7.2/no blood sugars at home  ? ESR raised 11/27/2016  ? Frequent urination 12/16/2018  ? Glaucoma 10/08/2015  ? High cholesterol   ? History of pancreatitis 07/11/2014  ? 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/23/2016  ? Hypertension 10/08/2015  ? IFG (impaired fasting glucose) 10/24/2019  ? Incomplete emptying  of bladder 11/30/2017  ? Knee pain, left 12/22/2016  ? Loss of memory 01/18/2020  ? Major depressive disorder in partial remission ([place]) 10/08/2015  ? Morbid obesity ([place]) 10/23/2016  ? Obesity (BMI 30-39.9) 11/10/2016  ? Obstructive sleep apnea 12/16/2018  ? Osteoarthritis of both knees 11/27/2016  ? Other insomnia 03/05/2017  ? Palpitations 10/23/2016  ? Positive ANA (antinuclear antibody) 10/23/2016  ? Sleep apnea 10/08/2015  ? Formatting of this note might be different from the original. Overview:  does not use  CPAP Formatting of this note might be different from the original. does not use CPAP  ? Sore neck 10/24/2019  ? Spondylosis of cervical spine with myelopathy 01/18/2020  ? Type 2 diabetes mellitus without complication, without long-term current use of insulin ([place]) 12/16/2018  ? Vitamin D deficiency 10/08/2015  ? ?Past Surgical History:  ?Procedure Laterality Date  ? APPENDECTOMY    ? age 57  ? CESAREAN SECTION    ? X 3  ? NECK SURGERY  2015  ? SPINE SURGERY  05/29/2015  ? ?Social History: ?  reports that she has quit smoking. Her smoking use included cigarettes. She has a 10.00 pack-year smoking history. She has never used smokeless tobacco. She reports that she does not currently use alcohol. She reports that she does not currently use drugs. ? ?Family History  ?Problem Relation Age of Onset  ? Diabetes Mother   ? Multiple sclerosis Mother   ? Heart disease Father   ? Heart Problems Father   ? Migraines Daughter   ? Thyroid disease Daughter   ? Migraines Daughter   ? ? ?Medications: ?Patient's Medications  ?New Prescriptions  ? No medications on file  ?Previous Medications  ? ACETAMINOPHEN (TYLENOL) 325 MG TABLET    Take 650 mg by mouth as needed.  ? AMLODIPINE (NORVASC) 5 MG TABLET    Take 1 tablet (5 mg total) by mouth daily.  ? HYDRALAZINE (APRESOLINE) 10 MG TABLET    Take 1 tablet (10 mg total) by mouth 3 (three) times daily.  ? IBUPROFEN (ADVIL) 200 MG CAPS    Take by mouth as needed.  ? LATANOPROST (XALATAN) 0.005 % OPHTHALMIC SOLUTION    Place 1 drop into both eyes at bedtime.  ? MAGNESIUM GLYCINATE PO    Take 350 mg by mouth.  ? MELATONIN 5 MG TABS    Take by mouth at bedtime. 1-2 tablets  ? OVER THE COUNTER MEDICATION    daily. Essential Oils  ? TRAZODONE (DESYREL) 100 MG TABLET    Take 200 mg by mouth at bedtime.  ?Modified Medications  ? No medications on file  ?Discontinued Medications  ? TRAZODONE (DESYREL) 100 MG TABLET    Take 1.5 tablets (150 mg total) by mouth at bedtime.  ? ? ?Physical  Exam: ? ?Vitals:  ? 11/17/21 1427  ?BP: 138/76  ?Pulse: 79  ?Temp: (!) 97.3 ?F (36.3 ?C)  ?TempSrc: Temporal  ?SpO2: 96%  ?Weight: 242 lb (109.8 kg)  ?Height: 5' 7"  (1.702 m)  ? ?Body mass index is 37.9 kg/m?. ?Wt Readings from Last 3 Encounters:  ?11/17/21 242 lb (109.8 kg)  ?07/11/21 239 lb 12.8 oz (108.8 kg)  ?05/02/21 234 lb 9.6 oz (106.4 kg)  ? ? ?Physical Exam ?Constitutional:   ?   General: She is not in acute distress. ?   Appearance: She is well-developed. She is not diaphoretic.  ?HENT:  ?   Head: Normocephalic and atraumatic.  ?   Mouth/Throat:  ?  Pharynx: No oropharyngeal exudate.  ?Eyes:  ?   Conjunctiva/sclera: Conjunctivae normal.  ?   Pupils: Pupils are equal, round, and reactive to light.  ?Cardiovascular:  ?   Rate and Rhythm: Normal rate and regular rhythm.  ?   Heart sounds: Normal heart sounds.  ?Pulmonary:  ?   Effort: Pulmonary effort is normal.  ?   Breath sounds: Normal breath sounds.  ?Abdominal:  ?   General: Bowel sounds are normal.  ?   Palpations: Abdomen is soft.  ?Musculoskeletal:  ?   Cervical back: Normal range of motion and neck supple.  ?   Right lower leg: No edema.  ?   Left lower leg: No edema.  ?Skin: ?   General: Skin is warm and dry.  ?Neurological:  ?   Mental Status: She is alert.  ?Psychiatric:     ?   Mood and Affect: Mood normal.  ? ? ?Labs reviewed: ?Basic Metabolic Panel: ?Recent Labs  ?  03/12/21 ?1601  ?NA 139  ?K 3.9  ?CL 103  ?CO2 26  ?GLUCOSE 126  ?BUN 19  ?CREATININE 0.74  ?CALCIUM 9.1  ? ?Liver Function Tests: ?Recent Labs  ?  03/12/21 ?1601  ?AST 15  ?ALT 17  ?BILITOT 0.3  ?PROT 6.7  ? ?No results for input(s): LIPASE, AMYLASE in the last 8760 hours. ?No results for input(s): AMMONIA in the last 8760 hours. ?CBC: ?Recent Labs  ?  03/12/21 ?1601  ?WBC 6.0  ?NEUTROABS 3,300  ?HGB 13.4  ?HCT 40.4  ?MCV 94.4  ?PLT 244  ? ?Lipid Panel: ?No results for input(s): CHOL, HDL, LDLCALC, TRIG, CHOLHDL, LDLDIRECT in the last 8760 hours. ?TSH: ?No results for input(s):  TSH in the last 8760 hours. ?A1C: ?Lab Results  ?Component Value Date  ? HGBA1C 5.9 (H) 03/12/2021  ? ? ? ?Assessment/Plan ?1. Morbid obesity ([place]) ?--education provided on healthy weight loss through increase in physica

## 2021-11-18 LAB — COMPLETE METABOLIC PANEL WITH GFR
AG Ratio: 1.7 (calc) (ref 1.0–2.5)
ALT: 15 U/L (ref 6–29)
AST: 14 U/L (ref 10–35)
Albumin: 4.2 g/dL (ref 3.6–5.1)
Alkaline phosphatase (APISO): 57 U/L (ref 37–153)
BUN: 22 mg/dL (ref 7–25)
CO2: 25 mmol/L (ref 20–32)
Calcium: 9.2 mg/dL (ref 8.6–10.4)
Chloride: 107 mmol/L (ref 98–110)
Creat: 0.94 mg/dL (ref 0.60–1.00)
Globulin: 2.5 g/dL (calc) (ref 1.9–3.7)
Glucose, Bld: 126 mg/dL (ref 65–139)
Potassium: 4.1 mmol/L (ref 3.5–5.3)
Sodium: 142 mmol/L (ref 135–146)
Total Bilirubin: 0.3 mg/dL (ref 0.2–1.2)
Total Protein: 6.7 g/dL (ref 6.1–8.1)
eGFR: 63 mL/min/{1.73_m2} (ref >=60)

## 2021-11-18 LAB — LIPID PANEL
Cholesterol: 204 mg/dL — ABNORMAL HIGH (ref <200)
HDL: 54 mg/dL (ref >=50)
LDL Cholesterol (Calc): 118 mg/dL (calc) — ABNORMAL HIGH
Non-HDL Cholesterol (Calc): 150 mg/dL (calc) — ABNORMAL HIGH (ref <130)
Total CHOL/HDL Ratio: 3.8 (calc) (ref <5.0)
Triglycerides: 199 mg/dL — ABNORMAL HIGH (ref <150)

## 2021-11-18 LAB — CBC WITH DIFFERENTIAL/PLATELET
Absolute Monocytes: 655 cells/uL (ref 200–950)
Basophils Absolute: 32 cells/uL (ref 0–200)
Basophils Relative: 0.5 %
Eosinophils Absolute: 202 cells/uL (ref 15–500)
Eosinophils Relative: 3.2 %
HCT: 39.8 % (ref 35.0–45.0)
Hemoglobin: 13.5 g/dL (ref 11.7–15.5)
Lymphs Abs: 2186 cells/uL (ref 850–3900)
MCH: 32.1 pg (ref 27.0–33.0)
MCHC: 33.9 g/dL (ref 32.0–36.0)
MCV: 94.5 fL (ref 80.0–100.0)
MPV: 10.3 fL (ref 7.5–12.5)
Monocytes Relative: 10.4 %
Neutro Abs: 3226 cells/uL (ref 1500–7800)
Neutrophils Relative %: 51.2 %
Platelets: 247 10*3/uL (ref 140–400)
RBC: 4.21 10*6/uL (ref 3.80–5.10)
RDW: 12.1 % (ref 11.0–15.0)
Total Lymphocyte: 34.7 %
WBC: 6.3 10*3/uL (ref 3.8–10.8)

## 2021-11-18 LAB — HEPATITIS C ANTIBODY
Hepatitis C Ab: NONREACTIVE
SIGNAL TO CUT-OFF: 0.09 (ref <1.00)

## 2021-11-18 LAB — HEMOGLOBIN A1C
Hgb A1c MFr Bld: 6.1 % of total Hgb — ABNORMAL HIGH (ref <5.7)
Mean Plasma Glucose: 128 mg/dL
eAG (mmol/L): 7.1 mmol/L

## 2021-11-20 ENCOUNTER — Other Ambulatory Visit: Payer: Self-pay

## 2021-11-27 ENCOUNTER — Encounter: Payer: Self-pay | Admitting: Nurse Practitioner

## 2021-12-15 ENCOUNTER — Ambulatory Visit (INDEPENDENT_AMBULATORY_CARE_PROVIDER_SITE_OTHER): Payer: Medicare HMO | Admitting: Nurse Practitioner

## 2021-12-15 ENCOUNTER — Encounter: Payer: Self-pay | Admitting: Nurse Practitioner

## 2021-12-15 VITALS — BP 128/88 | HR 77 | Temp 98.4°F | Ht 67.0 in | Wt 232.4 lb

## 2021-12-15 DIAGNOSIS — N3281 Overactive bladder: Secondary | ICD-10-CM | POA: Diagnosis not present

## 2021-12-15 DIAGNOSIS — I10 Essential (primary) hypertension: Secondary | ICD-10-CM

## 2021-12-15 DIAGNOSIS — H9113 Presbycusis, bilateral: Secondary | ICD-10-CM | POA: Diagnosis not present

## 2021-12-15 DIAGNOSIS — E1169 Type 2 diabetes mellitus with other specified complication: Secondary | ICD-10-CM | POA: Diagnosis not present

## 2021-12-15 NOTE — Progress Notes (Signed)
? ? ?Careteam: ?Patient Care Team: ?Lauree Chandler, NP as PCP - General (Geriatric Medicine) ?Berniece Salines, DO as PCP - Cardiology (Cardiology) ?Murlean Iba, MD as Referring Physician (Orthopedic Surgery) ?Park Liter, MD as Consulting Physician (Cardiology) ? ?PLACE OF SERVICE:  ?[facility]  ?Advanced Directive information ?  ? ?Allergies  ?Allergen Reactions  ? Amlodipine Swelling  ? Aripiprazole Other (See Comments)  ?  Tongue twisted, lips curled up ?Tongue twisted, lips curled up ?  ? Carvedilol Other (See Comments)  ?  Nightmares ?Nightmares ?  ? Hydralazine   ?  Diarrhea, abdominal pain  ? Irbesartan   ?  dizziness  ? Losartan   ?  dizzy  ? Paroxetine Hcl Other (See Comments)  ?  . ?. ?  ? Tizanidine Other (See Comments)  ?  Syncope ?Syncope ?  ? Tramadol Other (See Comments)  ?  Unknown  ? Zolpidem Other (See Comments)  ?  Sleepwalking ?Sleepwalking ?  ? Zyrtec [Cetirizine Hcl]   ?  Dizziness  ? ? ?Chief Complaint  ?Patient presents with  ? Follow-up  ?  3-4 week follow-up on ear. Foot exam today. Discuss need for eye exam and MALB or post pone if patient refuses.   ? ? ? ?HPI: Patient is a 76 y.o. female to have ear examined.  ? ?Feels like she is having trouble with tones and feels like she is missing on conversations. She had hearing exam years ago but at that time was not bad en ? ?Continues to have trouble with overactive bladder and this will wake her up at night.  ? ?Continues to have LE edema- trying to get off norvasc. Blood pressure has been good at home and good today. Did not take Norvasc last night.  ? ?Review of Systems:  ?Review of Systems  ?Constitutional:  Negative for chills, fever and weight loss.  ?HENT:  Positive for hearing loss. Negative for tinnitus.   ?Respiratory:  Negative for cough, sputum production and shortness of breath.   ?Cardiovascular:  Positive for leg swelling. Negative for chest pain and palpitations.  ?Gastrointestinal:  Negative for  abdominal pain, constipation, diarrhea and heartburn.  ?Genitourinary:  Positive for frequency. Negative for dysuria and urgency.  ?Musculoskeletal:  Negative for back pain, falls, joint pain and myalgias.  ?Skin: Negative.   ?Neurological:  Negative for dizziness and headaches.  ?Psychiatric/Behavioral:  Negative for depression and memory loss. The patient does not have insomnia.   ? ?Past Medical History:  ?Diagnosis Date  ? Abnormal EKG 10/23/2016  ? Arthritis 01/18/2020  ? Chronic fatigue 10/08/2015  ? Depression 01/18/2020  ? Diabetes mellitus ([place]) 10/08/2015  ? Formatting of this note might be different from the original. Overview:  last Hgb A1C 7.2/no blood sugars at home Formatting of this note might be different from the original. last Hgb A1C 7.2/no blood sugars at home  ? ESR raised 11/27/2016  ? Frequent urination 12/16/2018  ? Glaucoma 10/08/2015  ? High cholesterol   ? History of pancreatitis 07/11/2014  ? 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/23/2016  ? Hypertension 10/08/2015  ? IFG (impaired fasting glucose) 10/24/2019  ? Incomplete emptying of bladder 11/30/2017  ? Knee pain, left 12/22/2016  ? Loss of memory 01/18/2020  ? Major depressive disorder in partial remission ([place]) 10/08/2015  ? Morbid obesity ([place]) 10/23/2016  ? Obesity (BMI 30-39.9) 11/10/2016  ? Obstructive sleep apnea 12/16/2018  ? Osteoarthritis of both knees 11/27/2016  ? Other insomnia 03/05/2017  ? Palpitations  10/23/2016  ? Positive ANA (antinuclear antibody) 10/23/2016  ? Sleep apnea 10/08/2015  ? Formatting of this note might be different from the original. Overview:  does not use CPAP Formatting of this note might be different from the original. does not use CPAP  ? Sore neck 10/24/2019  ? Spondylosis of cervical spine with myelopathy 01/18/2020  ? Type 2 diabetes mellitus without complication, without long-term current use of insulin ([place]) 12/16/2018  ? Vitamin D deficiency 10/08/2015  ? ?Past Surgical History:  ?Procedure Laterality Date  ? APPENDECTOMY    ? age 73  ?  CESAREAN SECTION    ? X 3  ? NECK SURGERY  2015  ? SPINE SURGERY  05/29/2015  ? ?Social History: ?  reports that she has quit smoking. Her smoking use included cigarettes. She has a 10.00 pack-year smoking history. She has never used smokeless tobacco. She reports that she does not currently use alcohol. She reports that she does not currently use drugs. ? ?Family History  ?Problem Relation Age of Onset  ? Diabetes Mother   ? Multiple sclerosis Mother   ? Heart disease Father   ? Heart Problems Father   ? Migraines Daughter   ? Thyroid disease Daughter   ? Migraines Daughter   ? ? ?Medications: ?Patient's Medications  ?New Prescriptions  ? No medications on file  ?Previous Medications  ? AMLODIPINE (NORVASC) 5 MG TABLET    Take 1 tablet (5 mg total) by mouth daily.  ? IBUPROFEN 200 MG CAPS    Take by mouth as needed.  ? LATANOPROST (XALATAN) 0.005 % OPHTHALMIC SOLUTION    Place 1 drop into both eyes at bedtime.  ? MAGNESIUM GLYCINATE PO    Take 350 mg by mouth.  ? MELATONIN 5 MG TABS    Take by mouth at bedtime. 1-2 tablets  ? OVER THE COUNTER MEDICATION    daily. Essential Oils  ? POLYETHYLENE GLYCOL (MIRALAX / GLYCOLAX) 17 G PACKET    Take 17 g by mouth daily.  ? TRAZODONE (DESYREL) 100 MG TABLET    Take 200 mg by mouth at bedtime.  ?Modified Medications  ? No medications on file  ?Discontinued Medications  ? ACETAMINOPHEN (TYLENOL) 325 MG TABLET    Take 650 mg by mouth as needed.  ? ? ?Physical Exam: ? ?Vitals:  ? 12/15/21 1113  ?BP: 128/88  ?Pulse: 77  ?Temp: 98.4 ?F (36.9 ?C)  ?SpO2: 97%  ?Weight: 232 lb 6.4 oz (105.4 kg)  ?Height: $RemoveB'5\' 7"'HdPjozZy$  (1.702 m)  ? ?Body mass index is 36.4 kg/m?. ?Wt Readings from Last 3 Encounters:  ?12/15/21 232 lb 6.4 oz (105.4 kg)  ?11/17/21 242 lb (109.8 kg)  ?07/11/21 239 lb 12.8 oz (108.8 kg)  ? ? ?Physical Exam ?Constitutional:   ?   General: She is not in acute distress. ?   Appearance: She is well-developed. She is not diaphoretic.  ?HENT:  ?   Head: Normocephalic and atraumatic.  ?    Mouth/Throat:  ?   Pharynx: No oropharyngeal exudate.  ?Eyes:  ?   Conjunctiva/sclera: Conjunctivae normal.  ?   Pupils: Pupils are equal, round, and reactive to light.  ?Cardiovascular:  ?   Rate and Rhythm: Normal rate and regular rhythm.  ?   Heart sounds: Normal heart sounds.  ?Pulmonary:  ?   Effort: Pulmonary effort is normal.  ?   Breath sounds: Normal breath sounds.  ?Abdominal:  ?   General: Bowel sounds are normal.  ?  Palpations: Abdomen is soft.  ?Musculoskeletal:  ?   Cervical back: Normal range of motion and neck supple.  ?   Right lower leg: No edema.  ?   Left lower leg: No edema.  ?Skin: ?   General: Skin is warm and dry.  ?Neurological:  ?   Mental Status: She is alert.  ?Psychiatric:     ?   Mood and Affect: Mood normal.  ? ? ?Labs reviewed: ?Basic Metabolic Panel: ?Recent Labs  ?  03/12/21 ?1601 11/17/21 ?1509  ?NA 139 142  ?K 3.9 4.1  ?CL 103 107  ?CO2 26 25  ?GLUCOSE 126 126  ?BUN 19 22  ?CREATININE 0.74 0.94  ?CALCIUM 9.1 9.2  ? ?Liver Function Tests: ?Recent Labs  ?  03/12/21 ?1601 11/17/21 ?1509  ?AST 15 14  ?ALT 17 15  ?BILITOT 0.3 0.3  ?PROT 6.7 6.7  ? ?No results for input(s): LIPASE, AMYLASE in the last 8760 hours. ?No results for input(s): AMMONIA in the last 8760 hours. ?CBC: ?Recent Labs  ?  03/12/21 ?1601 11/17/21 ?1509  ?WBC 6.0 6.3  ?NEUTROABS 3,300 3,226  ?HGB 13.4 13.5  ?HCT 40.4 39.8  ?MCV 94.4 94.5  ?PLT 244 247  ? ?Lipid Panel: ?Recent Labs  ?  11/17/21 ?1509  ?CHOL 204*  ?HDL 54  ?LDLCALC 118*  ?TRIG 199*  ?CHOLHDL 3.8  ? ?TSH: ?No results for input(s): TSH in the last 8760 hours. ?A1C: ?Lab Results  ?Component Value Date  ? HGBA1C 6.1 (H) 11/17/2021  ? ? ? ?Assessment/Plan ?1. Presbycusis of both ears ?-ears clear bilaterally ?- Ambulatory referral to Audiology for further evaluation and treatment of hearing loss ? ?2. Type 2 diabetes mellitus with other specified complication, without long-term current use of insulin ([place]) ?-diet controlled.  ?- Ambulatory referral to  Ophthalmology ?- Microalbumin / creatinine urine ratio ? ?3. Primary hypertension ?-improved at this time, has been eating at home more, trying to decrease norvasc if able. ? ?4. Overactive bladder ?No longer on m

## 2021-12-16 LAB — MICROALBUMIN / CREATININE URINE RATIO
Creatinine, Urine: 49 mg/dL (ref 20–275)
Microalb Creat Ratio: 10 mcg/mg creat (ref <30)
Microalb, Ur: 0.5 mg/dL

## 2021-12-20 ENCOUNTER — Encounter: Payer: Self-pay | Admitting: Nurse Practitioner

## 2022-01-09 ENCOUNTER — Encounter: Payer: Self-pay | Admitting: Nurse Practitioner

## 2022-02-16 ENCOUNTER — Other Ambulatory Visit: Payer: Self-pay | Admitting: *Deleted

## 2022-02-16 MED ORDER — TRAZODONE HCL 100 MG PO TABS: 200.0000 mg | ORAL_TABLET | Freq: Every day | ORAL | 5 refills | Status: DC

## 2022-02-16 MED ORDER — AMLODIPINE BESYLATE 5 MG PO TABS: 5.0000 mg | ORAL_TABLET | Freq: Every day | ORAL | 1 refills | Status: DC

## 2022-02-16 NOTE — Telephone Encounter (Signed)
Pharmacy Requested refill.  Pended Rx and sent to Jessica for approval due to HIGH ALERT Warning.  

## 2022-02-23 DIAGNOSIS — H5203 Hypermetropia, bilateral: Secondary | ICD-10-CM | POA: Diagnosis not present

## 2022-02-23 DIAGNOSIS — H25012 Cortical age-related cataract, left eye: Secondary | ICD-10-CM | POA: Diagnosis not present

## 2022-02-23 DIAGNOSIS — H11153 Pinguecula, bilateral: Secondary | ICD-10-CM | POA: Diagnosis not present

## 2022-02-23 DIAGNOSIS — E119 Type 2 diabetes mellitus without complications: Secondary | ICD-10-CM | POA: Diagnosis not present

## 2022-02-23 DIAGNOSIS — H524 Presbyopia: Secondary | ICD-10-CM | POA: Diagnosis not present

## 2022-02-23 DIAGNOSIS — H40053 Ocular hypertension, bilateral: Secondary | ICD-10-CM | POA: Diagnosis not present

## 2022-02-23 DIAGNOSIS — H04123 Dry eye syndrome of bilateral lacrimal glands: Secondary | ICD-10-CM | POA: Diagnosis not present

## 2022-02-23 DIAGNOSIS — H35372 Puckering of macula, left eye: Secondary | ICD-10-CM | POA: Diagnosis not present

## 2022-02-23 DIAGNOSIS — H2513 Age-related nuclear cataract, bilateral: Secondary | ICD-10-CM | POA: Diagnosis not present

## 2022-02-23 DIAGNOSIS — H52203 Unspecified astigmatism, bilateral: Secondary | ICD-10-CM | POA: Diagnosis not present

## 2022-03-09 ENCOUNTER — Telehealth: Payer: Self-pay

## 2022-03-09 ENCOUNTER — Ambulatory Visit (INDEPENDENT_AMBULATORY_CARE_PROVIDER_SITE_OTHER): Payer: Medicare HMO | Admitting: Family

## 2022-03-09 ENCOUNTER — Encounter: Payer: Self-pay | Admitting: Family

## 2022-03-09 VITALS — Ht 67.0 in | Wt 232.0 lb

## 2022-03-09 DIAGNOSIS — R42 Dizziness and giddiness: Secondary | ICD-10-CM | POA: Diagnosis not present

## 2022-03-09 DIAGNOSIS — I10 Essential (primary) hypertension: Secondary | ICD-10-CM

## 2022-03-09 MED ORDER — MECLIZINE HCL 12.5 MG PO TABS: 12.5000 mg | ORAL_TABLET | Freq: Three times a day (TID) | ORAL | 0 refills | Status: DC | PRN

## 2022-03-09 NOTE — Telephone Encounter (Signed)
This service is provided via telemedicine  No vital signs collected/recorded due to the encounter was a telemedicine visit.   Location of patient (ex: home, work):  home  Patient consents to a telephone visit:  yes  Location of the provider (ex: office, home):  [facility]  Names of all persons participating in the telemedicine service and their role in the encounter:  patient, Ngetich, Dinah NP, [facility]   Time spent on call:  5 minutes

## 2022-03-09 NOTE — Progress Notes (Signed)
This service is provided via telemedicine  No vital signs collected/recorded due to the encounter was a telemedicine visit.   Location of patient (ex: home, work):  home   Patient consents to a telephone visit:  yes  Location of the provider (ex: office, home):  [facility]   Name of any referring provider:  N/A  Names of all persons participating in the telemedicine service and their role in the encounter:  patient, Alonzo Loving Lin Landsman, Negtich, Dinah NP  Time spent on call:  5 minutes

## 2022-03-09 NOTE — Progress Notes (Signed)
Provider: Maleek Craver FNP-C  Lauree Chandler, NP  Patient Care Team: Lauree Chandler, NP as PCP - General (Geriatric Medicine) Berniece Salines, DO as PCP - Cardiology (Cardiology) Murlean Iba, MD as Referring Physician (Orthopedic Surgery) Park Liter, MD as Consulting Physician (Cardiology)  Extended Emergency Contact Information Primary Emergency Contact: Abner Greenspan Mobile Phone: [phone] Relation: Daughter  Code Status: Full Code  Goals of care: Advanced Directive information    11/17/2021    2:37 PM  Advanced Directives  Does Patient Have a Medical Advance Directive? Yes  Type of Paramedic of [place];Living will  Does patient want to make changes to medical advance directive? No - Patient declined  Copy of [place] in Chart? No - copy requested     Chief Complaint  Patient presents with   Acute Visit    Patient is being seen via telephone for vertigo also getting dizzy after taking the amlodipine possible medication change     HPI:  Pt is a 76 y.o. female seen today for an acute visit for evaluation of vertigo. Has been in bed and sits a lot due to vertigo.states symptoms have worsen since she started on amlodipine 5 mg tablet daily.She was referred to physical therapy help.Has nausea but no vomiting.  Has tried to do the exercises given by Therapist but has made the dizziness worst.  Has changed how she takes her amlodipine to every other night.  Of note,she has taken lisinopril,losartan and Hydralazine but were discontinued due to dizziness.States cannot take any B/p medication that has dizziness as side effects.  Request medication to help with dizziness.  Discussed referral to Hypertensive clinic for B/P management but states cannot do anything right now request provider to send dizziness medication then a friend will pick up medication for her.   Past Medical History:  Diagnosis  Date   Abnormal EKG 10/23/2016   Arthritis 01/18/2020   Chronic fatigue 10/08/2015   Depression 01/18/2020   Diabetes mellitus ([place]) 10/08/2015   Formatting of this note might be different from the original. Overview:  last Hgb A1C 7.2/no blood sugars at home Formatting of this note might be different from the original. last Hgb A1C 7.2/no blood sugars at home   ESR raised 11/27/2016   Frequent urination 12/16/2018   Glaucoma 10/08/2015   High cholesterol    History of pancreatitis 07/11/2014   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/23/2016   Hypertension 10/08/2015   IFG (impaired fasting glucose) 10/24/2019   Incomplete emptying of bladder 11/30/2017   Knee pain, left 12/22/2016   Loss of memory 01/18/2020   Major depressive disorder in partial remission ([place]) 10/08/2015   Morbid obesity ([place]) 10/23/2016   Obesity (BMI 30-39.9) 11/10/2016   Obstructive sleep apnea 12/16/2018   Osteoarthritis of both knees 11/27/2016   Other insomnia 03/05/2017   Palpitations 10/23/2016   Positive ANA (antinuclear antibody) 10/23/2016   Sleep apnea 10/08/2015   Formatting of this note might be different from the original. Overview:  does not use CPAP Formatting of this note might be different from the original. does not use CPAP   Sore neck 10/24/2019   Spondylosis of cervical spine with myelopathy 01/18/2020   Type 2 diabetes mellitus without complication, without long-term current use of insulin ([place]) 12/16/2018   Vitamin D deficiency 10/08/2015   Past Surgical History:  Procedure Laterality Date   APPENDECTOMY     age 73   CESAREAN SECTION     X  3   NECK SURGERY  2015   SPINE SURGERY  05/29/2015    Allergies  Allergen Reactions   Amlodipine Swelling   Aripiprazole Other (See Comments)    Tongue twisted, lips curled up Tongue twisted, lips curled up    Carvedilol Other (See Comments)    Nightmares Nightmares    Hydralazine     Diarrhea, abdominal pain   Irbesartan     dizziness   Losartan     dizzy   Paroxetine Hcl Other  (See Comments)    . Marland Kitchen    Tizanidine Other (See Comments)    Syncope Syncope    Tramadol Other (See Comments)    Unknown   Zolpidem Other (See Comments)    Sleepwalking Sleepwalking    Zyrtec [Cetirizine Hcl]     Dizziness    Outpatient Encounter Medications as of 03/09/2022  Medication Sig   amLODipine (NORVASC) 5 MG tablet Take 1 tablet (5 mg total) by mouth daily.   Ibuprofen 200 MG CAPS Take by mouth as needed.   latanoprost (XALATAN) 0.005 % ophthalmic solution Place 1 drop into both eyes at bedtime.   MAGNESIUM GLYCINATE PO Take 350 mg by mouth.   melatonin 5 MG TABS Take by mouth at bedtime. 1-2 tablets   OVER THE COUNTER MEDICATION daily. Essential Oils   polyethylene glycol (MIRALAX / GLYCOLAX) 17 g packet Take 17 g by mouth daily.   traZODone (DESYREL) 100 MG tablet Take 2 tablets (200 mg total) by mouth at bedtime.   No facility-administered encounter medications on file as of 03/09/2022.    Review of Systems  Constitutional:  Negative for appetite change, chills, fatigue and fever.  HENT:  Negative for congestion, rhinorrhea, sinus pressure, sinus pain, sneezing and sore throat.   Respiratory:  Negative for cough, chest tightness, shortness of breath and wheezing.   Cardiovascular:  Negative for chest pain, palpitations and leg swelling.  Gastrointestinal:  Positive for nausea. Negative for abdominal pain and vomiting.  Neurological:  Positive for dizziness. Negative for speech difficulty, weakness, numbness and headaches.    Immunization History  Administered Date(s) Administered   Tdap 06/30/2018   Pertinent  Health Maintenance Due  Topic Date Due   FOOT EXAM  Never done   OPHTHALMOLOGY EXAM  Never done   DEXA SCAN  07/03/2022 (Originally 01/18/2010)   INFLUENZA VACCINE  03/31/2022   HEMOGLOBIN A1C  05/20/2022   URINE MICROALBUMIN  12/16/2022      07/30/2021   11:30 AM 08/05/2021    3:51 PM 08/21/2021   10:04 AM 11/17/2021    2:37 PM 12/15/2021     1:04 PM  Fall Risk  Falls in the past year? 0 0 0 0 0  Was there an injury with Fall? 0 0 0 0 0  Fall Risk Category Calculator 0 0 0 0 0  Fall Risk Category Low Low Low Low Low  Patient Fall Risk Level Low fall risk Low fall risk Low fall risk Low fall risk Low fall risk  Patient at Risk for Falls Due to No Fall Risks No Fall Risks History of fall(s)  No Fall Risks  Fall risk Follow up Falls evaluation completed Falls evaluation completed Falls evaluation completed;Education provided;Falls prevention discussed Falls evaluation completed Falls evaluation completed   Functional Status Survey:    Vitals:   03/09/22 1426  Weight: 232 lb (105.2 kg)  Height: 5' 7"  (1.702 m)   Body mass index is 36.34 kg/m. Physical Exam Unable to complete  on the Telephone.   Labs reviewed: Recent Labs    03/12/21 1601 11/17/21 1509  NA 139 142  K 3.9 4.1  CL 103 107  CO2 26 25  GLUCOSE 126 126  BUN 19 22  CREATININE 0.74 0.94  CALCIUM 9.1 9.2   Recent Labs    03/12/21 1601 11/17/21 1509  AST 15 14  ALT 17 15  BILITOT 0.3 0.3  PROT 6.7 6.7   Recent Labs    03/12/21 1601 11/17/21 1509  WBC 6.0 6.3  NEUTROABS 3,300 3,226  HGB 13.4 13.5  HCT 40.4 39.8  MCV 94.4 94.5  PLT 244 247   No results found for: "TSH" Lab Results  Component Value Date   HGBA1C 6.1 (H) 11/17/2021   Lab Results  Component Value Date   CHOL 204 (H) 11/17/2021   HDL 54 11/17/2021   LDLCALC 118 (H) 11/17/2021   TRIG 199 (H) 11/17/2021   CHOLHDL 3.8 11/17/2021    Significant Diagnostic Results in last 30 days:  No results found.  Assessment/Plan  1. Dizziness Reports worsening symptoms after starting on amlodipine.  Has been taking amlodipine every other night due to worsening symptoms of dizziness. -Has completed physical therapy which exercises helped but has tried to do the exercise for the past few days but symptoms worsen.. -Request medication to alleviate dizziness.Discussed starting on  meclizine Discussed use, risks and benefits of the medication verbalized understanding.   2. Primary hypertension Has changed amlodipine 5 mg tablet to every other night. She checked her blood pressure during visit was 145/75.  Has taken lisinopril, hydralazine and losartan in the past declined to restart any of the medication.  Recommended referral to hypertensive clinic or cardiology for further management of blood pressure but declines as requested meclizine for dizziness. -Advised to continue to monitor blood pressure  Family/ staff Communication: Reviewed plan of care with patient verbalized understanding  Labs/tests ordered: None   Next Appointment: Return if symptoms worsen or fail to improve.  I connected with  Melayah Skorupski on 03/09/22 by a telephone enabled telemedicine application and verified that I am speaking with the correct person using two identifiers.   I discussed the limitations of evaluation and management by telemedicine. The patient expressed understanding and agreed to proceed.  Spent 12 minutes of non-face to face with patient  >50% time spent counseling; reviewing medical record; labs; and developing future plan of care.  Sandrea Hughs, NP

## 2022-03-09 NOTE — Telephone Encounter (Signed)
tele

## 2022-03-16 ENCOUNTER — Ambulatory Visit: Payer: Medicare HMO | Admitting: Nurse Practitioner

## 2022-03-25 ENCOUNTER — Telehealth: Payer: Self-pay

## 2022-03-25 NOTE — Telephone Encounter (Signed)
Patient called stating that she has been dealing with vertigo for about 2 1/2 weeks. She stated that she spoke with [place] at [facility] @ [place] and was told that she needs a work order to be able to see her again. She would like order sent in.  Message routed to Abbey Chatters, NP

## 2022-03-25 NOTE — Telephone Encounter (Signed)
She will need a face to face for insurance to approve.

## 2022-03-25 NOTE — Telephone Encounter (Signed)
Patient is scheduled for mychart video visit for 03/26/22 with Abbey Chatters, NP to discuss vertigo and referral.

## 2022-03-26 ENCOUNTER — Telehealth (INDEPENDENT_AMBULATORY_CARE_PROVIDER_SITE_OTHER): Payer: Medicare HMO | Admitting: Nurse Practitioner

## 2022-03-26 DIAGNOSIS — R42 Dizziness and giddiness: Secondary | ICD-10-CM | POA: Diagnosis not present

## 2022-03-26 NOTE — Progress Notes (Signed)
Careteam: Patient Care Team: Lauree Chandler, NP as PCP - General (Geriatric Medicine) Berniece Salines, DO as PCP - Cardiology (Cardiology) Murlean Iba, MD as Referring Physician (Orthopedic Surgery) Park Liter, MD as Consulting Physician (Cardiology)  Advanced Directive information    Allergies  Allergen Reactions   Amlodipine Swelling   Aripiprazole Other (See Comments)    Tongue twisted, lips curled up Tongue twisted, lips curled up    Carvedilol Other (See Comments)    Nightmares Nightmares    Hydralazine     Diarrhea, abdominal pain   Irbesartan     dizziness   Losartan     dizzy   Paroxetine Hcl Other (See Comments)    . Marland Kitchen    Tizanidine Other (See Comments)    Syncope Syncope    Tramadol Other (See Comments)    Unknown   Zolpidem Other (See Comments)    Sleepwalking Sleepwalking    Zyrtec [Cetirizine Hcl]     Dizziness    Chief Complaint  Patient presents with   Acute Visit    Patient complains of vertigo for about 2 weeks.She would like to be referred to rehab for physical therapy.      HPI: Patient is a 76 y.o. female due to vertigo.  Reports she feels like she is on rough seas in the ocean, trying to walk on the deck and feeling nausea. Room is going back and forth.  Balance is off.  Reports her thinking is effected. She was not able to sew due to feeling and very debilitating.  Reports meclizine made her very dizzy.   Review of Systems:  Review of Systems  Constitutional:  Negative for chills and fever.  Eyes:  Negative for blurred vision, double vision and photophobia.  Gastrointestinal:  Positive for nausea. Negative for vomiting.  Neurological:  Positive for dizziness. Negative for tingling and headaches.    Past Medical History:  Diagnosis Date   Abnormal EKG 10/23/2016   Arthritis 01/18/2020   Chronic fatigue 10/08/2015   Depression 01/18/2020   Diabetes mellitus ([place]) 10/08/2015   Formatting of this note  might be different from the original. Overview:  last Hgb A1C 7.2/no blood sugars at home Formatting of this note might be different from the original. last Hgb A1C 7.2/no blood sugars at home   ESR raised 11/27/2016   Frequent urination 12/16/2018   Glaucoma 10/08/2015   High cholesterol    History of pancreatitis 07/11/2014   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/23/2016   Hypertension 10/08/2015   IFG (impaired fasting glucose) 10/24/2019   Incomplete emptying of bladder 11/30/2017   Knee pain, left 12/22/2016   Loss of memory 01/18/2020   Major depressive disorder in partial remission ([place]) 10/08/2015   Morbid obesity ([place]) 10/23/2016   Obesity (BMI 30-39.9) 11/10/2016   Obstructive sleep apnea 12/16/2018   Osteoarthritis of both knees 11/27/2016   Other insomnia 03/05/2017   Palpitations 10/23/2016   Positive ANA (antinuclear antibody) 10/23/2016   Sleep apnea 10/08/2015   Formatting of this note might be different from the original. Overview:  does not use CPAP Formatting of this note might be different from the original. does not use CPAP   Sore neck 10/24/2019   Spondylosis of cervical spine with myelopathy 01/18/2020   Type 2 diabetes mellitus without complication, without long-term current use of insulin ([place]) 12/16/2018   Vitamin D deficiency 10/08/2015   Past Surgical History:  Procedure Laterality Date   APPENDECTOMY     age  [place]  2015   SPINE SURGERY  05/29/2015   Social History:   reports that she has quit smoking. Her smoking use included cigarettes. She has a 10.00 pack-year smoking history. She has never used smokeless tobacco. She reports that she does not currently use alcohol. She reports that she does not currently use drugs.  Family History  Problem Relation Age of Onset   Diabetes Mother    Multiple sclerosis Mother    Heart disease Father    Heart Problems Father    Migraines Daughter    Thyroid disease Daughter    Migraines Daughter      Medications: Patient's Medications  New Prescriptions   No medications on file  Previous Medications   AMLODIPINE (NORVASC) 5 MG TABLET    Take 1 tablet (5 mg total) by mouth daily.   IBUPROFEN 200 MG CAPS    Take by mouth as needed.   LATANOPROST (XALATAN) 0.005 % OPHTHALMIC SOLUTION    Place 1 drop into both eyes at bedtime.   MAGNESIUM GLYCINATE PO    Take 350 mg by mouth.   MECLIZINE (ANTIVERT) 12.5 MG TABLET    Take 1 tablet (12.5 mg total) by mouth 3 (three) times daily as needed for dizziness.   MELATONIN 5 MG TABS    Take by mouth at bedtime. 1-2 tablets   OVER THE COUNTER MEDICATION    daily. Essential Oils   POLYETHYLENE GLYCOL (MIRALAX / GLYCOLAX) 17 G PACKET    Take 17 g by mouth daily.   TRAZODONE (DESYREL) 100 MG TABLET    Take 2 tablets (200 mg total) by mouth at bedtime.  Modified Medications   No medications on file  Discontinued Medications   No medications on file    Physical Exam:  There were no vitals filed for this visit. There is no height or weight on file to calculate BMI. Wt Readings from Last 3 Encounters:  03/09/22 232 lb (105.2 kg)  12/15/21 232 lb 6.4 oz (105.4 kg)  11/17/21 242 lb (109.8 kg)    Physical Exam Constitutional:      Appearance: Normal appearance.  Pulmonary:     Effort: Pulmonary effort is normal.  Neurological:     Mental Status: She is alert. Mental status is at baseline.  Psychiatric:        Mood and Affect: Mood normal.     Labs reviewed: Basic Metabolic Panel: Recent Labs    11/17/21 1509  NA 142  K 4.1  CL 107  CO2 25  GLUCOSE 126  BUN 22  CREATININE 0.94  CALCIUM 9.2   Liver Function Tests: Recent Labs    11/17/21 1509  AST 14  ALT 15  BILITOT 0.3  PROT 6.7   No results for input(s): "LIPASE", "AMYLASE" in the last 8760 hours. No results for input(s): "AMMONIA" in the last 8760 hours. CBC: Recent Labs    11/17/21 1509  WBC 6.3  NEUTROABS 3,226  HGB 13.5  HCT 39.8  MCV 94.5  PLT 247    Lipid Panel: Recent Labs    11/17/21 1509  CHOL 204*  HDL 54  LDLCALC 118*  TRIG 199*  CHOLHDL 3.8   TSH: No results for input(s): "TSH" in the last 8760 hours. A1C: Lab Results  Component Value Date   HGBA1C 6.1 (H) 11/17/2021     Assessment/Plan 1. Vertigo -can not tolerate meclizine, made her more  dizzy -to make sure she is staying well hydrated.  - Ambulatory referral to Physical Therapy for vestibular rehab.    Carlos American. Harle Battiest  [facility] [phone]    Virtual Visit via Deloris Ping  I connected with patient on 03/26/22 at  3:00 PM EDT by video and verified that I am speaking with the correct person using two identifiers.  Location: Patient: home Provider: [place]   I discussed the limitations, risks, security and privacy concerns of performing an evaluation and management service by telephone and the availability of in person appointments. I also discussed with the patient that there may be a patient responsible charge related to this service. The patient expressed understanding and agreed to proceed.   I discussed the assessment and treatment plan with the patient. The patient was provided an opportunity to ask questions and all were answered. The patient agreed with the plan and demonstrated an understanding of the instructions.   The patient was advised to call back or seek an in-person evaluation if the symptoms worsen or if the condition fails to improve as anticipated.  I provided 15 minutes of non-face-to-face time during this encounter.  Carlos American. Harle Battiest Avs printed and mailed

## 2022-03-26 NOTE — Progress Notes (Signed)
This service is provided via telemedicine  No vital signs collected/recorded due to the encounter was a telemedicine visit.   Location of patient (ex: home, work):  Home  Patient consents to a telephone visit:  Yes, see encounter dated 03/09/2022  Location of the provider (ex: office, home):  [place] [facility] [place]  Name of any referring provider:  N/A  Names of all persons participating in the telemedicine service and their role in the encounter:  Abbey Chatters, Nurse Practitioner, Elveria Royals, CMA, and patient.   Time spent on call:  7 minutes with medical assistant

## 2022-03-30 ENCOUNTER — Encounter: Payer: Self-pay | Admitting: Nurse Practitioner

## 2022-03-30 DIAGNOSIS — R42 Dizziness and giddiness: Secondary | ICD-10-CM

## 2022-04-03 ENCOUNTER — Encounter: Payer: Self-pay | Admitting: Nurse Practitioner

## 2022-04-06 ENCOUNTER — Encounter: Payer: Self-pay | Admitting: Nurse Practitioner

## 2022-04-06 NOTE — Telephone Encounter (Signed)
Patient called and stated that she called [facility] and they DENIED her request for an appointment.   They told her that she is already seeing [facility] and they will not see her.   Patient wants to know what to do. Stated that she is still having Vertigo.  Please Advise.

## 2022-04-07 ENCOUNTER — Other Ambulatory Visit: Payer: Self-pay

## 2022-04-07 ENCOUNTER — Encounter: Payer: Self-pay | Admitting: Rehabilitative and Restorative Service Providers"

## 2022-04-07 ENCOUNTER — Ambulatory Visit: Payer: Medicare HMO | Attending: Nurse Practitioner | Admitting: Rehabilitative and Restorative Service Providers"

## 2022-04-07 DIAGNOSIS — M6281 Muscle weakness (generalized): Secondary | ICD-10-CM | POA: Insufficient documentation

## 2022-04-07 DIAGNOSIS — R42 Dizziness and giddiness: Secondary | ICD-10-CM | POA: Insufficient documentation

## 2022-04-07 DIAGNOSIS — R2689 Other abnormalities of gait and mobility: Secondary | ICD-10-CM | POA: Insufficient documentation

## 2022-04-07 DIAGNOSIS — R252 Cramp and spasm: Secondary | ICD-10-CM | POA: Diagnosis not present

## 2022-04-07 NOTE — Therapy (Signed)
OUTPATIENT PHYSICAL THERAPY VESTIBULAR EVALUATION     Patient Name: Brenda Mcfarland MRN: [id number] DOB:05-Apr-1946, 76 y.o., female Today's Date: 04/07/2022  PCP: Lauree Chandler, NP REFERRING PROVIDER: Lauree Chandler, NP   PT End of Session - 04/07/22 1455     Visit Number 1    Date for PT Re-Evaluation 05/29/22    Authorization Type [employer]    Progress Note Due on Visit 10    PT Start Time 1449    PT Stop Time 1525    PT Time Calculation (min) 36 min    Activity Tolerance Patient tolerated treatment well    Behavior During Therapy [facility] for tasks assessed/performed             Past Medical History:  Diagnosis Date   Abnormal EKG 10/23/2016   Arthritis 01/18/2020   Chronic fatigue 10/08/2015   Depression 01/18/2020   Diabetes mellitus ([place]) 10/08/2015   Formatting of this note might be different from the original. Overview:  last Hgb A1C 7.2/no blood sugars at home Formatting of this note might be different from the original. last Hgb A1C 7.2/no blood sugars at home   ESR raised 11/27/2016   Frequent urination 12/16/2018   Glaucoma 10/08/2015   High cholesterol    History of pancreatitis 07/11/2014   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/23/2016   Hypertension 10/08/2015   IFG (impaired fasting glucose) 10/24/2019   Incomplete emptying of bladder 11/30/2017   Knee pain, left 12/22/2016   Loss of memory 01/18/2020   Major depressive disorder in partial remission ([place]) 10/08/2015   Morbid obesity ([place]) 10/23/2016   Obesity (BMI 30-39.9) 11/10/2016   Obstructive sleep apnea 12/16/2018   Osteoarthritis of both knees 11/27/2016   Other insomnia 03/05/2017   Palpitations 10/23/2016   Positive ANA (antinuclear antibody) 10/23/2016   Sleep apnea 10/08/2015   Formatting of this note might be different from the original. Overview:  does not use CPAP Formatting of this note might be different from the original. does not use CPAP   Sore neck 10/24/2019   Spondylosis of cervical spine with myelopathy  01/18/2020   Type 2 diabetes mellitus without complication, without long-term current use of insulin ([place]) 12/16/2018   Vitamin D deficiency 10/08/2015   Past Surgical History:  Procedure Laterality Date   APPENDECTOMY     age 29   CESAREAN SECTION     X [place]  2015   SPINE SURGERY  05/29/2015   Patient Active Problem List   Diagnosis Date Noted   Paroxysmal SVT (supraventricular tachycardia) ([place]) 01/22/2020   Arthritis 01/18/2020   Depression 01/18/2020   Loss of memory 01/18/2020   Spondylosis of cervical spine with myelopathy 01/18/2020   IFG (impaired fasting glucose) 10/24/2019   Sore neck 10/24/2019   Frequent urination 12/16/2018   Obstructive sleep apnea 12/16/2018   Type 2 diabetes mellitus without complication, without long-term current use of insulin ([place]) 12/16/2018   Incomplete emptying of bladder 11/30/2017   Other insomnia 03/05/2017   Knee pain, left 12/22/2016   ESR raised 11/27/2016   Osteoarthritis of both knees 11/27/2016   Obesity (BMI 30-39.9) 11/10/2016   Abnormal EKG 10/23/2016   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/23/2016   Morbid obesity ([place]) 10/23/2016   Palpitations 10/23/2016   Positive ANA (antinuclear antibody) 10/23/2016   Hypertension 10/08/2015   Chronic fatigue 10/08/2015   Diabetes mellitus ([place]) 10/08/2015   Glaucoma 10/08/2015   Major depressive disorder in partial remission ([place]) 10/08/2015   Vitamin D  deficiency 10/08/2015   Sleep apnea 10/08/2015   History of pancreatitis 07/11/2014    ONSET DATE: June 17/2023  REFERRING DIAG: R42 (ICD-10-CM) - Vertigo   THERAPY DIAG:  Dizziness and giddiness  Balance problem  Other abnormalities of gait and mobility  Muscle weakness (generalized)  Cramp and spasm  Rationale for Evaluation and Treatment Rehabilitation  SUBJECTIVE:   SUBJECTIVE STATEMENT: Pt reports that she as doing better after discharge from PT in February.  Pt states that she started having worsening  dizziness/vestibular symptoms for at least 4 weeks.  Pt is reporting some increased tremors and feeling like she is on a rocky ship in the waves.  Pt reports that she is still having some pain in her sciatica region with difficulty getting comfortable with sleeping. Pt accompanied by: family member daughter Anderson Malta  PERTINENT HISTORY: history neck pain/surgery, tinnitis, chronic LBP, recent COVID infection with brain fog, glaucoma, obesity, bil knee OA, T2DM   PAIN:  Are you having pain? Yes: NPRS scale: 3-7/10 Pain location: right hip/sciatica, but sometimes on left Pain description: sharp Aggravating factors: worse at night Relieving factors: unknown  PRECAUTIONS: Fall secondary to dizziness  WEIGHT BEARING RESTRICTIONS No  FALLS: Has patient fallen in last 6 months? No  LIVING ENVIRONMENT: Lives with: lives alone Lives in: House/apartment Stairs: Yes: Internal: 2 steps; on right going up and External: 3 steps; bilateral but cannot reach both Has following equipment at home: Single point cane  PLOF: Independent  PATIENT GOALS:  To be able to drive and go out in the community again independently.  OBJECTIVE:   DIAGNOSTIC FINDINGS: n/a  COGNITION: Overall cognitive status: Within functional limits for tasks assessed    SENSATION: WFL  POSTURE: rounded shoulders   Cervical ROM:    Active A/ROM (deg) eval  Flexion 25  Extension 25  Right lateral flexion 20  Left lateral flexion 15  Right rotation 40  Left rotation 38  (Blank rows = not tested)  STRENGTH:  04/07/2022: UE strength is WFL. LE strength grossly 4/5   GAIT: Gait pattern: step to pattern, antalgic, and wide BOS Distance walked: 50 ft Assistive device utilized: Single point cane Level of assistance: SBA Comments: Pt requires close SBA with minimally unsteady gait pattern noted.  FUNCTIONAL TESTs:  5 times sit to stand: 19.2 sec with UE use required Timed up and go (TUG): 21.4 sec without  assistive device  PATIENT SURVEYS:  04/07/2022: DHI Total Score: 74 / 100 Physical Score: 16 / 28 Emotional Score: 24 / 36 Functional Score: 34 / 36  VESTIBULAR ASSESSMENT   GENERAL OBSERVATION: Pt is known to this PT from a previous episode ending in February 2023.    SYMPTOM BEHAVIOR:   Subjective history: Pt was doing better following discharge in February, however, since mid June, pt has been noticing worsening dizziness.   Non-Vestibular symptoms: changes in vision and nausea/vomiting   Type of dizziness: Oscillopsia, Unsteady with head/body turns, and "World moves"   Frequency: near daily basis now   Duration: for at least 4 weeks   Aggravating factors: Induced by position change: rolling to the right and rolling to the left and Induced by motion: looking up at the ceiling, turning body quickly, turning head quickly, and driving   Relieving factors: rest and slow movements   Progression of symptoms: worse   OCULOMOTOR EXAM:   Ocular Alignment: normal   Ocular ROM: No Limitations   Spontaneous Nystagmus: absent   Gaze-Induced Nystagmus: absent   Smooth  Pursuits: intact   Saccades: intact   Convergence/Divergence: pt with some difficulty with convergence     VESTIBULAR - OCULAR REFLEX:    Head thrust test:  positive primarily with turns to right side    POSITIONAL TESTING: Right Dix-Hallpike: right geotropic nystagmus noted Left Dix-Hallpike: very minimal nystagmus noted with "feeling weird" reported    OTHOSTATICS: not done   VESTIBULAR TREATMENT:  Canalith Repositioning:   Epley Right: Number of Reps: 1 and Response to Treatment: symptoms improved and Epley Left: Number of Reps: 1 and Response to Treatment: symptoms improved Other: Reviewed occular tracking HEP and pencil push-ups HEP.  PATIENT EDUCATION: Education details: Pt and daughter issued HEP Person educated: Patient and Child(ren) Education method: Explanation, Demonstration, and Handouts Education  comprehension: verbalized understanding and returned demonstration  HOME EXERCISE PROGRAM: Access Code: [email or web address] URL: [email or web address] Date: 04/07/2022 Prepared by: Shelby Dubin Tadarius Maland  Exercises - Pencil Pushups  - 1 x daily - 7 x weekly - 3 sets - 10 reps - Seated Vertical Smooth Pursuit  - 1 x daily - 7 x weekly - 3 sets - 10 reps - Seated Diagonal Smooth Pursuit  - 1 x daily - 7 x weekly - 3 sets - 10 reps   GOALS: Goals reviewed with patient? Yes  SHORT TERM GOALS: Target date: 04/28/2022   Independent with initial HEP. Baseline: Goal status: INITIAL  2.  Pt will be able to participate in a BERG balance assessment. Baseline:  Goal status: INITIAL   LONG TERM GOALS: Target date: 05/29/2022    Pt will be independent with advanced HEP. Baseline:  Goal status: INITIAL  2.  Improve walking tolerance to at least 800' without limitation from back/leg pain or dizziness/imbalance.  Baseline:  Goal status: INITIAL  3.  Patient will improve score on BERG to at least 50/56 to decrease risk of falls. Baseline:  Goal status: INITIAL  4.  Pt. will report 60% improvement in vestibular impairment.  Baseline:  Goal status: INITIAL  5.  Pt will increase bilateral LE strength to at least 4+/5 to allow pt to negotiate stairs with increased ease. Baseline: 4/5 Goal status: INITIAL   ASSESSMENT:  CLINICAL IMPRESSION: Patient is a 76 y.o. female who was seen today for physical therapy evaluation and treatment for vertigo. Pts PLOF is able to drive independently and able to go out to participate in community outings with her friends.  Pt reports that she has been trying some of her HEP, but has not been as successful and wanted to come back to skilled PT to progress towards decreased dizziness.  Pt reports that she is still having some of her hip/sciatic pain, especially at the end of the night when sleeping.  Pt presents with dizziness, decreased balance, muscle  weakness, and abnormality of gait.  Pt would benefit from skilled PT to progress towards goal related activities.   OBJECTIVE IMPAIRMENTS decreased balance, decreased strength, dizziness, increased muscle spasms, impaired flexibility, and pain.   ACTIVITY LIMITATIONS bending, standing, and locomotion level  PARTICIPATION LIMITATIONS: cleaning, laundry, and community activity  PERSONAL FACTORS Past/current experiences, Time since onset of injury/illness/exacerbation, and 3+ comorbidities: history neck pain/surgery, tinnitis, chronic LBP, previous COVID infection with brain fog, glaucoma, obesity, bil knee OA, T2DM  are also affecting patient's functional outcome.   REHAB POTENTIAL: Good  CLINICAL DECISION MAKING: Evolving/moderate complexity  EVALUATION COMPLEXITY: Moderate   PLAN: PT FREQUENCY: 1-2x/week  PT DURATION: 8 weeks  PLANNED INTERVENTIONS: Therapeutic exercises, Therapeutic activity, Neuromuscular re-education, Balance training,  Gait training, Patient/Family education, Self Care, Joint mobilization, Joint manipulation, Stair training, Vestibular training, Canalith repositioning, Aquatic Therapy, Dry Needling, Spinal manipulation, Spinal mobilization, Cryotherapy, Moist heat, Taping, Ionotophoresis 56m/ml Dexamethasone, Manual therapy, and Re-evaluation  PLAN FOR NEXT SESSION: progress and assess HEP as indicated, Dix-Hallpike, vestibular rehab   [place] PT 04/07/2022, 4:52 PM   [facility] [place] [place] [address]Phone # [phone] [phone]

## 2022-04-10 ENCOUNTER — Encounter: Payer: Self-pay | Admitting: Nurse Practitioner

## 2022-04-15 ENCOUNTER — Other Ambulatory Visit: Payer: Self-pay

## 2022-04-15 NOTE — Telephone Encounter (Signed)
Pharmacy sent over refill request for lorsartan 25mg . Medication not on medication list. Called patient to clarify she was taking mediction. Patient states that she takes Losartan 50 mg along with the amlodipine. Patient states that the losartan 50mg  was given to her by Dr. . She has still been getting it refilled. I just need to clarify what strength patient should be taking. She states that taking the 50mg  along with the amlodipine her bp is in the 130 range.  Message routed to , NP

## 2022-04-15 NOTE — Telephone Encounter (Signed)
Okay to update medication list and send to pharmacy for refill

## 2022-04-15 NOTE — Telephone Encounter (Signed)
Should patient continue to take losartan 50mg  or should she be taken 25mg .

## 2022-04-15 NOTE — Telephone Encounter (Signed)
Per verbal conversation with [name], NP patient to continue the Losartan 50 mg  Losartan is listed on patients allergy/intolerance list and making dizziness worse.  I will forward back to [place] to sign pended RX.

## 2022-04-16 NOTE — Telephone Encounter (Signed)
Since she has continued to take medication and tolerating at this time I have refilled and removed intolerance.

## 2022-04-21 ENCOUNTER — Encounter: Payer: Self-pay | Admitting: Rehabilitative and Restorative Service Providers"

## 2022-04-21 ENCOUNTER — Ambulatory Visit: Payer: Medicare HMO | Admitting: Rehabilitative and Restorative Service Providers"

## 2022-04-21 DIAGNOSIS — M6281 Muscle weakness (generalized): Secondary | ICD-10-CM

## 2022-04-21 DIAGNOSIS — R2689 Other abnormalities of gait and mobility: Secondary | ICD-10-CM

## 2022-04-21 DIAGNOSIS — R42 Dizziness and giddiness: Secondary | ICD-10-CM

## 2022-04-21 DIAGNOSIS — R252 Cramp and spasm: Secondary | ICD-10-CM

## 2022-04-21 NOTE — Therapy (Signed)
OUTPATIENT PHYSICAL THERAPY TREATMENT NOTE     Patient Name: Brenda Mcfarland MRN: [id number] DOB:1946/01/05, 76 y.o., female Today's Date: 04/21/2022  PCP: Lauree Chandler, NP REFERRING PROVIDER: Lauree Chandler, NP   PT End of Session - 04/21/22 0803     Visit Number 2    Date for PT Re-Evaluation 05/29/22    Authorization Type [employer] Medicare    Progress Note Due on Visit 10    PT Start Time 0800    PT Stop Time 0840    PT Time Calculation (min) 40 min    Activity Tolerance Patient tolerated treatment well    Behavior During Therapy [facility] for tasks assessed/performed             Past Medical History:  Diagnosis Date   Abnormal EKG 10/23/2016   Arthritis 01/18/2020   Chronic fatigue 10/08/2015   Depression 01/18/2020   Diabetes mellitus ([place]) 10/08/2015   Formatting of this note might be different from the original. Overview:  last Hgb A1C 7.2/no blood sugars at home Formatting of this note might be different from the original. last Hgb A1C 7.2/no blood sugars at home   ESR raised 11/27/2016   Frequent urination 12/16/2018   Glaucoma 10/08/2015   High cholesterol    History of pancreatitis 07/11/2014   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/23/2016   Hypertension 10/08/2015   IFG (impaired fasting glucose) 10/24/2019   Incomplete emptying of bladder 11/30/2017   Knee pain, left 12/22/2016   Loss of memory 01/18/2020   Major depressive disorder in partial remission ([place]) 10/08/2015   Morbid obesity ([place]) 10/23/2016   Obesity (BMI 30-39.9) 11/10/2016   Obstructive sleep apnea 12/16/2018   Osteoarthritis of both knees 11/27/2016   Other insomnia 03/05/2017   Palpitations 10/23/2016   Positive ANA (antinuclear antibody) 10/23/2016   Sleep apnea 10/08/2015   Formatting of this note might be different from the original. Overview:  does not use CPAP Formatting of this note might be different from the original. does not use CPAP   Sore neck 10/24/2019   Spondylosis of cervical spine with myelopathy 01/18/2020    Type 2 diabetes mellitus without complication, without long-term current use of insulin ([place]) 12/16/2018   Vitamin D deficiency 10/08/2015   Past Surgical History:  Procedure Laterality Date   APPENDECTOMY     age 51   CESAREAN SECTION     X [place]  2015   SPINE SURGERY  05/29/2015   Patient Active Problem List   Diagnosis Date Noted   Paroxysmal SVT (supraventricular tachycardia) ([place]) 01/22/2020   Arthritis 01/18/2020   Depression 01/18/2020   Loss of memory 01/18/2020   Spondylosis of cervical spine with myelopathy 01/18/2020   IFG (impaired fasting glucose) 10/24/2019   Sore neck 10/24/2019   Frequent urination 12/16/2018   Obstructive sleep apnea 12/16/2018   Type 2 diabetes mellitus without complication, without long-term current use of insulin ([place]) 12/16/2018   Incomplete emptying of bladder 11/30/2017   Other insomnia 03/05/2017   Knee pain, left 12/22/2016   ESR raised 11/27/2016   Osteoarthritis of both knees 11/27/2016   Obesity (BMI 30-39.9) 11/10/2016   Abnormal EKG 10/23/2016   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/23/2016   Morbid obesity ([place]) 10/23/2016   Palpitations 10/23/2016   Positive ANA (antinuclear antibody) 10/23/2016   Hypertension 10/08/2015   Chronic fatigue 10/08/2015   Diabetes mellitus ([place]) 10/08/2015   Glaucoma 10/08/2015   Major depressive disorder in partial remission ([place]) 10/08/2015   Vitamin D  deficiency 10/08/2015   Sleep apnea 10/08/2015   History of pancreatitis 07/11/2014    ONSET DATE: June 17/2023  REFERRING DIAG: R42 (ICD-10-CM) - Vertigo   THERAPY DIAG:  Dizziness and giddiness  Balance problem  Other abnormalities of gait and mobility  Muscle weakness (generalized)  Cramp and spasm  Rationale for Evaluation and Treatment Rehabilitation  SUBJECTIVE:   SUBJECTIVE STATEMENT: Pt reports that she has had some days of having decreased dizziness, but is still having problems with weakness and dizziness.  Pt reports that  she was able to sleep on both sides without dizziness.  PERTINENT HISTORY: history neck pain/surgery, tinnitis, chronic LBP, recent COVID infection with brain fog, glaucoma, obesity, bil knee OA, T2DM   PAIN:  Are you having pain? Yes: NPRS scale: 4/10 Pain location: cervical Pain description: sharp Aggravating factors: worse at night Relieving factors: unknown  PRECAUTIONS: Fall secondary to dizziness  WEIGHT BEARING RESTRICTIONS No  FALLS: Has patient fallen in last 6 months? No  LIVING ENVIRONMENT: Lives with: lives alone Lives in: House/apartment Stairs: Yes: Internal: 2 steps; on right going up and External: 3 steps; bilateral but cannot reach both Has following equipment at home: Single point cane  PLOF: Independent  PATIENT GOALS:  To be able to drive and go out in the community again independently.  OBJECTIVE:   DIAGNOSTIC FINDINGS: n/a  COGNITION: Overall cognitive status: Within functional limits for tasks assessed    SENSATION: WFL  POSTURE: rounded shoulders   Cervical ROM:    Active A/ROM (deg) eval  Flexion 25  Extension 25  Right lateral flexion 20  Left lateral flexion 15  Right rotation 40  Left rotation 38  (Blank rows = not tested)  STRENGTH:  04/07/2022: UE strength is WFL. LE strength grossly 4/5   GAIT: Gait pattern: step to pattern, antalgic, and wide BOS Distance walked: 50 ft Assistive device utilized: Single point cane Level of assistance: SBA Comments: Pt requires close SBA with minimally unsteady gait pattern noted.  FUNCTIONAL TESTs:  5 times sit to stand: 19.2 sec with UE use required Timed up and go (TUG): 21.4 sec without assistive device  PATIENT SURVEYS:   04/21/2022: BERG Balance 34/56  04/07/2022: DHI Total Score: 74 / 100 Physical Score: 16 / 28 Emotional Score: 24 / 36 Functional Score: 34 / 36  VESTIBULAR ASSESSMENT   GENERAL OBSERVATION: Pt is known to this PT from a previous episode ending in  February 2023.    SYMPTOM BEHAVIOR:   Subjective history: Pt was doing better following discharge in February, however, since mid June, pt has been noticing worsening dizziness.   Non-Vestibular symptoms: changes in vision and nausea/vomiting   Type of dizziness: Oscillopsia, Unsteady with head/body turns, and "World moves"   Frequency: near daily basis now   Duration: for at least 4 weeks   Aggravating factors: Induced by position change: rolling to the right and rolling to the left and Induced by motion: looking up at the ceiling, turning body quickly, turning head quickly, and driving   Relieving factors: rest and slow movements   Progression of symptoms: worse   OCULOMOTOR EXAM:   Ocular Alignment: normal   Ocular ROM: No Limitations   Spontaneous Nystagmus: absent   Gaze-Induced Nystagmus: absent   Smooth Pursuits: intact   Saccades: intact   Convergence/Divergence: pt with some difficulty with convergence     VESTIBULAR - OCULAR REFLEX:    Head thrust test:  positive primarily with turns to right side  POSITIONAL TESTING: Right Dix-Hallpike: right geotropic nystagmus noted Left Dix-Hallpike: very minimal nystagmus noted with "feeling weird" reported    OTHOSTATICS: not done   VESTIBULAR TREATMENT:  TODAY'S TREATMENT:  04/21/2022: Chin Tucks and scapular retraction 2x10 each Pencil push-ups 2x10 BERG balance Hallpike Dix slightly positive on right side for 1-2 beats, proceeded with treatment of Epley on right side. Standing with head nods and head rotation x30 sec each with recovery period in between  04/07/2022: Canalith Repositioning:   Epley Right: Number of Reps: 1 and Response to Treatment: symptoms improved and Epley Left: Number of Reps: 1 and Response to Treatment: symptoms improved Other: Reviewed occular tracking HEP and pencil push-ups HEP.  PATIENT EDUCATION: Education details: Pt and daughter issued HEP Person educated: Patient and  Child(ren) Education method: Explanation, Demonstration, and Handouts Education comprehension: verbalized understanding and returned demonstration  HOME EXERCISE PROGRAM: Access Code: [email or web address] URL: [email or web address] Date: 04/21/2022 Prepared by: Shelby Dubin Scotty Pinder  Exercises - Pencil Pushups  - 1 x daily - 7 x weekly - 3 sets - 10 reps - Seated Vertical Smooth Pursuit  - 1 x daily - 7 x weekly - 3 sets - 10 reps - Seated Diagonal Smooth Pursuit  - 1 x daily - 7 x weekly - 3 sets - 10 reps - Seated Cervical Retraction  - 1 x daily - 7 x weekly - 2 sets - 10 reps - Seated Scapular Retraction  - 1 x daily - 7 x weekly - 2 sets - 10 reps - Standing with Head Rotation  - 1 x daily - 7 x weekly - 3 sets - 10 reps - Standing with Head Nod  - 1 x daily - 7 x weekly - 3 sets - 10 reps   GOALS: Goals reviewed with patient? Yes  SHORT TERM GOALS: Target date: 04/28/2022   Independent with initial HEP. Baseline: Goal status: IN PROGRESS  2.  Pt will be able to participate in a BERG balance assessment. Baseline:  Goal status: MET   LONG TERM GOALS: Target date: 05/29/2022    Pt will be independent with advanced HEP. Baseline:  Goal status: INITIAL  2.  Improve walking tolerance to at least 800' without limitation from back/leg pain or dizziness/imbalance.  Baseline:  Goal status: INITIAL  3.  Patient will improve score on BERG to at least 50/56 to decrease risk of falls. Baseline:  Goal status: INITIAL  4.  Pt. will report 60% improvement in vestibular impairment.  Baseline:  Goal status: INITIAL  5.  Pt will increase bilateral LE strength to at least 4+/5 to allow pt to negotiate stairs with increased ease. Baseline: 4/5 Goal status: INITIAL   ASSESSMENT:  CLINICAL IMPRESSION: Ms Castonguay presents to skilled rehabilitation stating that she has been able to lie down in bed on either side without dizziness.  Pt does still report dizziness and unsteadiness  throughout the day as well as weakness.  Pt provided with updated HEP printout during session today.  Pt educated on vestibular rehab today and the importance of gradual progress with vestibular therapy.  Pt verbalizes understanding.  Pt with BERG to place her at risk of falling.  Pt continues to progress towards goal related activities.   OBJECTIVE IMPAIRMENTS decreased balance, decreased strength, dizziness, increased muscle spasms, impaired flexibility, and pain.   ACTIVITY LIMITATIONS bending, standing, and locomotion level  PARTICIPATION LIMITATIONS: cleaning, laundry, and community activity  PERSONAL FACTORS Past/current experiences, Time since onset of injury/illness/exacerbation, and 3+ comorbidities: history neck  pain/surgery, tinnitis, chronic LBP, previous COVID infection with brain fog, glaucoma, obesity, bil knee OA, T2DM  are also affecting patient's functional outcome.   REHAB POTENTIAL: Good  CLINICAL DECISION MAKING: Evolving/moderate complexity  EVALUATION COMPLEXITY: Moderate   PLAN: PT FREQUENCY: 1-2x/week  PT DURATION: 8 weeks  PLANNED INTERVENTIONS: Therapeutic exercises, Therapeutic activity, Neuromuscular re-education, Balance training, Gait training, Patient/Family education, Self Care, Joint mobilization, Joint manipulation, Stair training, Vestibular training, Canalith repositioning, Aquatic Therapy, Dry Needling, Spinal manipulation, Spinal mobilization, Cryotherapy, Moist heat, Taping, Ionotophoresis 37m/ml Dexamethasone, Manual therapy, and Re-evaluation  PLAN FOR NEXT SESSION: progress and assess HEP as indicated, Dix-Hallpike, vestibular rehab   [place] PT 04/21/2022, 8:55 AM   BSt. [facility]'S Elmore[address] SKohls RanchGMount [place] [address]Phone # [phone] [phone]

## 2022-04-22 ENCOUNTER — Ambulatory Visit: Payer: Medicare HMO | Admitting: Rehabilitative and Restorative Service Providers"

## 2022-04-22 ENCOUNTER — Encounter: Payer: Self-pay | Admitting: Rehabilitative and Restorative Service Providers"

## 2022-04-22 DIAGNOSIS — R42 Dizziness and giddiness: Secondary | ICD-10-CM

## 2022-04-22 DIAGNOSIS — R2689 Other abnormalities of gait and mobility: Secondary | ICD-10-CM | POA: Diagnosis not present

## 2022-04-22 DIAGNOSIS — R252 Cramp and spasm: Secondary | ICD-10-CM

## 2022-04-22 DIAGNOSIS — M6281 Muscle weakness (generalized): Secondary | ICD-10-CM

## 2022-04-22 NOTE — Therapy (Signed)
OUTPATIENT PHYSICAL THERAPY TREATMENT NOTE     Patient Name: Brenda Mcfarland MRN: [id number] DOB:1946/07/25, 76 y.o., female Today's Date: 04/22/2022  PCP: Lauree Chandler, NP REFERRING PROVIDER: Lauree Chandler, NP   PT End of Session - 04/22/22 1231     Visit Number 3    Date for PT Re-Evaluation 05/29/22    Authorization Type [employer]    Progress Note Due on Visit 10    PT Start Time 1228    PT Stop Time 1306    PT Time Calculation (min) 38 min    Activity Tolerance Patient tolerated treatment well    Behavior During Therapy [facility] for tasks assessed/performed             Past Medical History:  Diagnosis Date   Abnormal EKG 10/23/2016   Arthritis 01/18/2020   Chronic fatigue 10/08/2015   Depression 01/18/2020   Diabetes mellitus ([place]) 10/08/2015   Formatting of this note might be different from the original. Overview:  last Hgb A1C 7.2/no blood sugars at home Formatting of this note might be different from the original. last Hgb A1C 7.2/no blood sugars at home   ESR raised 11/27/2016   Frequent urination 12/16/2018   Glaucoma 10/08/2015   High cholesterol    History of pancreatitis 07/11/2014   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/23/2016   Hypertension 10/08/2015   IFG (impaired fasting glucose) 10/24/2019   Incomplete emptying of bladder 11/30/2017   Knee pain, left 12/22/2016   Loss of memory 01/18/2020   Major depressive disorder in partial remission ([place]) 10/08/2015   Morbid obesity ([place]) 10/23/2016   Obesity (BMI 30-39.9) 11/10/2016   Obstructive sleep apnea 12/16/2018   Osteoarthritis of both knees 11/27/2016   Other insomnia 03/05/2017   Palpitations 10/23/2016   Positive ANA (antinuclear antibody) 10/23/2016   Sleep apnea 10/08/2015   Formatting of this note might be different from the original. Overview:  does not use CPAP Formatting of this note might be different from the original. does not use CPAP   Sore neck 10/24/2019   Spondylosis of cervical spine with myelopathy 01/18/2020    Type 2 diabetes mellitus without complication, without long-term current use of insulin ([place]) 12/16/2018   Vitamin D deficiency 10/08/2015   Past Surgical History:  Procedure Laterality Date   APPENDECTOMY     age 70   CESAREAN SECTION     X [place]  2015   SPINE SURGERY  05/29/2015   Patient Active Problem List   Diagnosis Date Noted   Paroxysmal SVT (supraventricular tachycardia) ([place]) 01/22/2020   Arthritis 01/18/2020   Depression 01/18/2020   Loss of memory 01/18/2020   Spondylosis of cervical spine with myelopathy 01/18/2020   IFG (impaired fasting glucose) 10/24/2019   Sore neck 10/24/2019   Frequent urination 12/16/2018   Obstructive sleep apnea 12/16/2018   Type 2 diabetes mellitus without complication, without long-term current use of insulin ([place]) 12/16/2018   Incomplete emptying of bladder 11/30/2017   Other insomnia 03/05/2017   Knee pain, left 12/22/2016   ESR raised 11/27/2016   Osteoarthritis of both knees 11/27/2016   Obesity (BMI 30-39.9) 11/10/2016   Abnormal EKG 10/23/2016   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/23/2016   Morbid obesity ([place]) 10/23/2016   Palpitations 10/23/2016   Positive ANA (antinuclear antibody) 10/23/2016   Hypertension 10/08/2015   Chronic fatigue 10/08/2015   Diabetes mellitus ([place]) 10/08/2015   Glaucoma 10/08/2015   Major depressive disorder in partial remission ([place]) 10/08/2015   Vitamin D  deficiency 10/08/2015   Sleep apnea 10/08/2015   History of pancreatitis 07/11/2014    ONSET DATE: June 17/2023  REFERRING DIAG: R42 (ICD-10-CM) - Vertigo   THERAPY DIAG:  Dizziness and giddiness  Balance problem  Other abnormalities of gait and mobility  Muscle weakness (generalized)  Cramp and spasm  Rationale for Evaluation and Treatment Rehabilitation  SUBJECTIVE:   SUBJECTIVE STATEMENT: Pt reports that she had dizziness following her appointment yesterday, but is feeling some better today.  PERTINENT HISTORY: history neck  pain/surgery, tinnitis, chronic LBP, recent COVID infection with brain fog, glaucoma, obesity, bil knee OA, T2DM   PAIN:  Are you having pain? Yes: NPRS scale: 3/10 Pain location: cervical Pain description: sharp Aggravating factors: worse at night Relieving factors: unknown  PRECAUTIONS: Fall secondary to dizziness  WEIGHT BEARING RESTRICTIONS No  FALLS: Has patient fallen in last 6 months? No  LIVING ENVIRONMENT: Lives with: lives alone Lives in: House/apartment Stairs: Yes: Internal: 2 steps; on right going up and External: 3 steps; bilateral but cannot reach both Has following equipment at home: Single point cane  PLOF: Independent  PATIENT GOALS:  To be able to drive and go out in the community again independently.  OBJECTIVE:   DIAGNOSTIC FINDINGS: n/a  COGNITION: Overall cognitive status: Within functional limits for tasks assessed    SENSATION: WFL  POSTURE: rounded shoulders   Cervical ROM:    Active A/ROM (deg) eval  Flexion 25  Extension 25  Right lateral flexion 20  Left lateral flexion 15  Right rotation 40  Left rotation 38  (Blank rows = not tested)  STRENGTH:  04/07/2022: UE strength is WFL. LE strength grossly 4/5   GAIT: Gait pattern: step to pattern, antalgic, and wide BOS Distance walked: 50 ft Assistive device utilized: Single point cane Level of assistance: SBA Comments: Pt requires close SBA with minimally unsteady gait pattern noted.  FUNCTIONAL TESTs:  5 times sit to stand: 19.2 sec with UE use required Timed up and go (TUG): 21.4 sec without assistive device  PATIENT SURVEYS:   04/21/2022: BERG Balance 34/56  04/07/2022: DHI Total Score: 74 / 100 Physical Score: 16 / 28 Emotional Score: 24 / 36 Functional Score: 34 / 36  VESTIBULAR ASSESSMENT   GENERAL OBSERVATION: Pt is known to this PT from a previous episode ending in February 2023.    SYMPTOM BEHAVIOR:   Subjective history: Pt was doing better following  discharge in February, however, since mid June, pt has been noticing worsening dizziness.   Non-Vestibular symptoms: changes in vision and nausea/vomiting   Type of dizziness: Oscillopsia, Unsteady with head/body turns, and "World moves"   Frequency: near daily basis now   Duration: for at least 4 weeks   Aggravating factors: Induced by position change: rolling to the right and rolling to the left and Induced by motion: looking up at the ceiling, turning body quickly, turning head quickly, and driving   Relieving factors: rest and slow movements   Progression of symptoms: worse   OCULOMOTOR EXAM:   Ocular Alignment: normal   Ocular ROM: No Limitations   Spontaneous Nystagmus: absent   Gaze-Induced Nystagmus: absent   Smooth Pursuits: intact   Saccades: intact   Convergence/Divergence: pt with some difficulty with convergence     VESTIBULAR - OCULAR REFLEX:    Head thrust test:  positive primarily with turns to right side    POSITIONAL TESTING: Right Dix-Hallpike: right geotropic nystagmus noted Left Dix-Hallpike: very minimal nystagmus noted with "feeling weird"  reported    OTHOSTATICS: not done   VESTIBULAR TREATMENT:  TODAY'S TREATMENT:  04/22/2022: Nustep level 4 x7 min with PT present to discuss status Seated with 2# ankle weights:  LAQ, marching, hip abduction clams with red tband, hip adduction ball squeeze.  2x10 each bilat Seated hamstring curls red tband 2x10 each Sit to/from stand holding 5# kettlebell:  x10 with chest press, x10 with overhead press Cervical rotation 2x10 bilat Upper trap stretch 2x20 sec bilat Chin Tucks and scapular retraction 2x10 each Tandem gait down barre x3 laps each Standing hip abduction at barre x10 bilat  04/21/2022: Chin Tucks and scapular retraction 2x10 each Pencil push-ups 2x10 BERG balance Hallpike Dix slightly positive on right side for 1-2 beats, proceeded with treatment of Epley on right side. Standing with head nods and  head rotation x30 sec each with recovery period in between  04/07/2022: Canalith Repositioning:   Epley Right: Number of Reps: 1 and Response to Treatment: symptoms improved and Epley Left: Number of Reps: 1 and Response to Treatment: symptoms improved Other: Reviewed occular tracking HEP and pencil push-ups HEP.  PATIENT EDUCATION: Education details: Pt and daughter issued HEP Person educated: Patient and Child(ren) Education method: Explanation, Demonstration, and Handouts Education comprehension: verbalized understanding and returned demonstration  HOME EXERCISE PROGRAM: Access Code: [email or web address] URL: [email or web address] Date: 04/21/2022 Prepared by: Shelby Dubin Aerin Delany  Exercises - Pencil Pushups  - 1 x daily - 7 x weekly - 3 sets - 10 reps - Seated Vertical Smooth Pursuit  - 1 x daily - 7 x weekly - 3 sets - 10 reps - Seated Diagonal Smooth Pursuit  - 1 x daily - 7 x weekly - 3 sets - 10 reps - Seated Cervical Retraction  - 1 x daily - 7 x weekly - 2 sets - 10 reps - Seated Scapular Retraction  - 1 x daily - 7 x weekly - 2 sets - 10 reps - Standing with Head Rotation  - 1 x daily - 7 x weekly - 3 sets - 10 reps - Standing with Head Nod  - 1 x daily - 7 x weekly - 3 sets - 10 reps   GOALS: Goals reviewed with patient? Yes  SHORT TERM GOALS: Target date: 04/28/2022   Independent with initial HEP. Baseline: Goal status: IN PROGRESS  2.  Pt will be able to participate in a BERG balance assessment. Baseline:  Goal status: MET   LONG TERM GOALS: Target date: 05/29/2022    Pt will be independent with advanced HEP. Baseline:  Goal status: INITIAL  2.  Improve walking tolerance to at least 800' without limitation from back/leg pain or dizziness/imbalance.  Baseline:  Goal status: INITIAL  3.  Patient will improve score on BERG to at least 50/56 to decrease risk of falls. Baseline:  Goal status: INITIAL  4.  Pt. will report 60% improvement in vestibular  impairment.  Baseline:  Goal status: INITIAL  5.  Pt will increase bilateral LE strength to at least 4+/5 to allow pt to negotiate stairs with increased ease. Baseline: 4/5 Goal status: INITIAL   ASSESSMENT:  CLINICAL IMPRESSION: Ms Huffstetler presents to skilled rehabilitation reporting increased dizziness following her last session, but is reporting feeling better today.  Pt continues to deny dizziness with lying down in bed.  Pt with some fatigue following seated strengthening exercises.  Pt continues to require skilled PT to progress towards goal related activities.   OBJECTIVE IMPAIRMENTS decreased balance, decreased strength, dizziness, increased muscle spasms,  impaired flexibility, and pain.   ACTIVITY LIMITATIONS bending, standing, and locomotion level  PARTICIPATION LIMITATIONS: cleaning, laundry, and community activity  PERSONAL FACTORS Past/current experiences, Time since onset of injury/illness/exacerbation, and 3+ comorbidities: history neck pain/surgery, tinnitis, chronic LBP, previous COVID infection with brain fog, glaucoma, obesity, bil knee OA, T2DM  are also affecting patient's functional outcome.   REHAB POTENTIAL: Good  CLINICAL DECISION MAKING: Evolving/moderate complexity  EVALUATION COMPLEXITY: Moderate   PLAN: PT FREQUENCY: 1-2x/week  PT DURATION: 8 weeks  PLANNED INTERVENTIONS: Therapeutic exercises, Therapeutic activity, Neuromuscular re-education, Balance training, Gait training, Patient/Family education, Self Care, Joint mobilization, Joint manipulation, Stair training, Vestibular training, Canalith repositioning, Aquatic Therapy, Dry Needling, Spinal manipulation, Spinal mobilization, Cryotherapy, Moist heat, Taping, Ionotophoresis 80m/ml Dexamethasone, Manual therapy, and Re-evaluation  PLAN FOR NEXT SESSION: progress and assess HEP as indicated, Dix-Hallpike as indicated, vestibular rehab   SMonterey PT 04/22/2022, 12:32 PM   [facility] [place]ier [place] [address]Phone # [phone] [phone]

## 2022-04-27 ENCOUNTER — Ambulatory Visit: Payer: Medicare HMO | Admitting: Rehabilitative and Restorative Service Providers"

## 2022-04-29 ENCOUNTER — Encounter: Payer: Self-pay | Admitting: Rehabilitative and Restorative Service Providers"

## 2022-04-29 ENCOUNTER — Ambulatory Visit: Payer: Medicare HMO | Admitting: Rehabilitative and Restorative Service Providers"

## 2022-04-29 DIAGNOSIS — M6281 Muscle weakness (generalized): Secondary | ICD-10-CM | POA: Diagnosis not present

## 2022-04-29 DIAGNOSIS — R252 Cramp and spasm: Secondary | ICD-10-CM

## 2022-04-29 DIAGNOSIS — R2689 Other abnormalities of gait and mobility: Secondary | ICD-10-CM

## 2022-04-29 DIAGNOSIS — R42 Dizziness and giddiness: Secondary | ICD-10-CM

## 2022-04-29 NOTE — Therapy (Signed)
OUTPATIENT PHYSICAL THERAPY TREATMENT NOTE     Patient Name: Brenda Mcfarland MRN: [id number] DOB:01/19/1946, 76 y.o., female Today's Date: 04/29/2022  PCP: Eubanks, Jessica K, NP REFERRING PROVIDER: Eubanks, Jessica K, NP   PT End of Session - 04/29/22 1100     Visit Number 4    Date for PT Re-Evaluation 05/29/22    Authorization Type [employer] Medicare    Progress Note Due on Visit 10    PT Start Time 1100    PT Stop Time 1140    PT Time Calculation (min) 40 min    Activity Tolerance Patient tolerated treatment well    Behavior During Therapy WFL for tasks assessed/performed             Past Medical History:  Diagnosis Date   Abnormal EKG 10/23/2016   Arthritis 01/18/2020   Chronic fatigue 10/08/2015   Depression 01/18/2020   Diabetes mellitus ([facility]) 10/08/2015   Formatting of this note might be different from the original. Overview:  last Hgb A1C 7.2/no blood sugars at home Formatting of this note might be different from the original. last Hgb A1C 7.2/no blood sugars at home   ESR raised 11/27/2016   Frequent urination 12/16/2018   Glaucoma 10/08/2015   High cholesterol    History of pancreatitis 07/11/2014   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/23/2016   Hypertension 10/08/2015   IFG (impaired fasting glucose) 10/24/2019   Incomplete emptying of bladder 11/30/2017   Knee pain, left 12/22/2016   Loss of memory 01/18/2020   Major depressive disorder in partial remission ([facility]) 10/08/2015   Morbid obesity ([facility]) 10/23/2016   Obesity (BMI 30-39.9) 11/10/2016   Obstructive sleep apnea 12/16/2018   Osteoarthritis of both knees 11/27/2016   Other insomnia 03/05/2017   Palpitations 10/23/2016   Positive ANA (antinuclear antibody) 10/23/2016   Sleep apnea 10/08/2015   Formatting of this note might be different from the original. Overview:  does not use CPAP Formatting of this note might be different from the original. does not use CPAP   Sore neck 10/24/2019   Spondylosis of cervical spine with myelopathy 01/18/2020    Type 2 diabetes mellitus without complication, without long-term current use of insulin ([facility]) 12/16/2018   Vitamin D deficiency 10/08/2015   Past Surgical History:  Procedure Laterality Date   APPENDECTOMY     age 9   CESAREAN SECTION     X 3   NECK SURGERY  2015   SPINE SURGERY  05/29/2015   Patient Active Problem List   Diagnosis Date Noted   Paroxysmal SVT (supraventricular tachycardia) ([facility]) 01/22/2020   Arthritis 01/18/2020   Depression 01/18/2020   Loss of memory 01/18/2020   Spondylosis of cervical spine with myelopathy 01/18/2020   IFG (impaired fasting glucose) 10/24/2019   Sore neck 10/24/2019   Frequent urination 12/16/2018   Obstructive sleep apnea 12/16/2018   Type 2 diabetes mellitus without complication, without long-term current use of insulin ([facility]) 12/16/2018   Incomplete emptying of bladder 11/30/2017   Other insomnia 03/05/2017   Knee pain, left 12/22/2016   ESR raised 11/27/2016   Osteoarthritis of both knees 11/27/2016   Obesity (BMI 30-39.9) 11/10/2016   Abnormal EKG 10/23/2016   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/23/2016   Morbid obesity ([facility]) 10/23/2016   Palpitations 10/23/2016   Positive ANA (antinuclear antibody) 10/23/2016   Hypertension 10/08/2015   Chronic fatigue 10/08/2015   Diabetes mellitus ([facility]) 10/08/2015   Glaucoma 10/08/2015   Major depressive disorder in partial remission ([facility]) 10/08/2015   Vitamin D   deficiency 10/08/2015   Sleep apnea 10/08/2015   History of pancreatitis 07/11/2014    ONSET DATE: June 17/2023  REFERRING DIAG: R42 (ICD-10-CM) - Vertigo   THERAPY DIAG:  Dizziness and giddiness  Balance problem  Other abnormalities of gait and mobility  Muscle weakness (generalized)  Cramp and spasm  Rationale for Evaluation and Treatment Rehabilitation  SUBJECTIVE:   SUBJECTIVE STATEMENT: Pt reports that she was sore and fatigued from last visit's strengthening exercises.  Pt states that this morning, she rolled over and had  another bought of dizziness.  PERTINENT HISTORY: history neck pain/surgery, tinnitis, chronic LBP, recent COVID infection with brain fog, glaucoma, obesity, bil knee OA, T2DM   PAIN:  Are you having pain? Yes: NPRS scale: 3/10 Pain location: cervical Pain description: sharp Aggravating factors: worse at night Relieving factors: unknown  PRECAUTIONS: Fall secondary to dizziness  WEIGHT BEARING RESTRICTIONS No  FALLS: Has patient fallen in last 6 months? No  LIVING ENVIRONMENT: Lives with: lives alone Lives in: House/apartment Stairs: Yes: Internal: 2 steps; on right going up and External: 3 steps; bilateral but cannot reach both Has following equipment at home: Single point cane  PLOF: Independent  PATIENT GOALS:  To be able to drive and go out in the community again independently.  OBJECTIVE:   DIAGNOSTIC FINDINGS: n/a  COGNITION: Overall cognitive status: Within functional limits for tasks assessed    SENSATION: WFL  POSTURE: rounded shoulders   Cervical ROM:    Active A/ROM (deg) eval  Flexion 25  Extension 25  Right lateral flexion 20  Left lateral flexion 15  Right rotation 40  Left rotation 38  (Blank rows = not tested)  STRENGTH:  04/07/2022: UE strength is WFL. LE strength grossly 4/5   GAIT: Gait pattern: step to pattern, antalgic, and wide BOS Distance walked: 50 ft Assistive device utilized: Single point cane Level of assistance: SBA Comments: Pt requires close SBA with minimally unsteady gait pattern noted.  FUNCTIONAL TESTs:  5 times sit to stand: 19.2 sec with UE use required Timed up and go (TUG): 21.4 sec without assistive device  PATIENT SURVEYS:   04/21/2022: BERG Balance 34/56  04/07/2022: DHI Total Score: 74 / 100 Physical Score: 16 / 28 Emotional Score: 24 / 36 Functional Score: 34 / 36  VESTIBULAR ASSESSMENT   GENERAL OBSERVATION: Pt is known to this PT from a previous episode ending in February 2023.    SYMPTOM  BEHAVIOR:   Subjective history: Pt was doing better following discharge in February, however, since mid June, pt has been noticing worsening dizziness.   Non-Vestibular symptoms: changes in vision and nausea/vomiting   Type of dizziness: Oscillopsia, Unsteady with head/body turns, and "World moves"   Frequency: near daily basis now   Duration: for at least 4 weeks   Aggravating factors: Induced by position change: rolling to the right and rolling to the left and Induced by motion: looking up at the ceiling, turning body quickly, turning head quickly, and driving   Relieving factors: rest and slow movements   Progression of symptoms: worse   OCULOMOTOR EXAM:   Ocular Alignment: normal   Ocular ROM: No Limitations   Spontaneous Nystagmus: absent   Gaze-Induced Nystagmus: absent   Smooth Pursuits: intact   Saccades: intact   Convergence/Divergence: pt with some difficulty with convergence     VESTIBULAR - OCULAR REFLEX:    Head thrust test:  positive primarily with turns to right side    POSITIONAL TESTING: Right Dix-Hallpike: right  geotropic nystagmus noted Left Dix-Hallpike: very minimal nystagmus noted with "feeling weird" reported    OTHOSTATICS: not done   VESTIBULAR TREATMENT:  TODAY'S TREATMENT:  04/29/2022: Dix Hallpike negative bilaterally Pencil pushups 2x10 with cuing to slow down Chin Tucks and scapular retraction 2x10 each Visual tracking of target for ABC's  Standing performing head turns horizontal and vertical x1 min each Seated:  LAQ, marching, hip adduction ball squeeze.  BLE 2x10  04/22/2022: Nustep level 4 x7 min with PT present to discuss status Seated with 2# ankle weights:  LAQ, marching, hip abduction clams with red tband, hip adduction ball squeeze.  2x10 each bilat Seated hamstring curls red tband 2x10 each Sit to/from stand holding 5# kettlebell:  x10 with chest press, x10 with overhead press Cervical rotation 2x10 bilat Upper trap stretch 2x20  sec bilat Chin Tucks and scapular retraction 2x10 each Tandem gait down barre x3 laps each Standing hip abduction at barre x10 bilat  04/21/2022: Chin Tucks and scapular retraction 2x10 each Pencil push-ups 2x10 BERG balance Hallpike Dix slightly positive on right side for 1-2 beats, proceeded with treatment of Epley on right side. Standing with head nods and head rotation x30 sec each with recovery period in between   PATIENT EDUCATION: Education details: Pt and daughter issued HEP Person educated: Patient and Child(ren) Education method: Explanation, Demonstration, and Handouts Education comprehension: verbalized understanding and returned demonstration  HOME EXERCISE PROGRAM: Access Code: [email or web address] URL: [email or web address] Date: 04/29/2022 Prepared by: Shaunda Menke  Exercises - Pencil Pushups  - 1 x daily - 7 x weekly - 3 sets - 10 reps - Seated Vertical Smooth Pursuit  - 1 x daily - 7 x weekly - 3 sets - 10 reps - Seated Diagonal Smooth Pursuit  - 1 x daily - 7 x weekly - 3 sets - 10 reps - Seated Cervical Retraction  - 1 x daily - 7 x weekly - 2 sets - 10 reps - Seated Scapular Retraction  - 1 x daily - 7 x weekly - 2 sets - 10 reps - Standing with Head Rotation  - 1 x daily - 7 x weekly - 3 sets - 10 reps - Standing with Head Nod  - 1 x daily - 7 x weekly - 3 sets - 10 reps - Supine Knee Extension Strengthening  - 1 x daily - 7 x weekly - 2 sets - 10 reps - Seated Long Arc Quad  - 1 x daily - 7 x weekly - 2 sets - 10 reps - Seated March  - 1 x daily - 7 x weekly - 2 sets - 10 reps - Seated Hip Adduction Isometrics with Ball  - 1 x daily - 7 x weekly - 2 sets - 10 reps   GOALS: Goals reviewed with patient? Yes  SHORT TERM GOALS: Target date: 04/28/2022   Independent with initial HEP. Baseline: Goal status: MET  2.  Pt will be able to participate in a BERG balance assessment. Baseline:  Goal status: MET   LONG TERM GOALS: Target date: 05/29/2022     Pt will be independent with advanced HEP. Baseline:  Goal status: INITIAL  2.  Improve walking tolerance to at least 800' without limitation from back/leg pain or dizziness/imbalance.  Baseline:  Goal status: INITIAL  3.  Patient will improve score on BERG to at least 50/56 to decrease risk of falls. Baseline:  Goal status: INITIAL  4.  Pt. will report 60% improvement in vestibular impairment.  Baseline:    Goal status: INITIAL  5.  Pt will increase bilateral LE strength to at least 4+/5 to allow pt to negotiate stairs with increased ease. Baseline: 4/5 Goal status: INITIAL   ASSESSMENT:  CLINICAL IMPRESSION: Ms Belford presents to skilled rehabilitation reporting a bought of increased dizziness this morning, but reports that it did not last too long.  Pt able to progress with exercises during session and provided pt with HEP for seated strengthening.  Pt reported eye muscle fatigue with visual tracking exercise.  Pt continues to require skilled PT to progress towards goal related activities.   OBJECTIVE IMPAIRMENTS decreased balance, decreased strength, dizziness, increased muscle spasms, impaired flexibility, and pain.   ACTIVITY LIMITATIONS bending, standing, and locomotion level  PARTICIPATION LIMITATIONS: cleaning, laundry, and community activity  PERSONAL FACTORS Past/current experiences, Time since onset of injury/illness/exacerbation, and 3+ comorbidities: history neck pain/surgery, tinnitis, chronic LBP, previous COVID infection with brain fog, glaucoma, obesity, bil knee OA, T2DM  are also affecting patient's functional outcome.   REHAB POTENTIAL: Good  CLINICAL DECISION MAKING: Evolving/moderate complexity  EVALUATION COMPLEXITY: Moderate   PLAN: PT FREQUENCY: 1-2x/week  PT DURATION: 8 weeks  PLANNED INTERVENTIONS: Therapeutic exercises, Therapeutic activity, Neuromuscular re-education, Balance training, Gait training, Patient/Family education, Self Care,  Joint mobilization, Joint manipulation, Stair training, Vestibular training, Canalith repositioning, Aquatic Therapy, Dry Needling, Spinal manipulation, Spinal mobilization, Cryotherapy, Moist heat, Taping, Ionotophoresis 4mg/ml Dexamethasone, Manual therapy, and Re-evaluation  PLAN FOR NEXT SESSION: progress and assess HEP as indicated, Dix-Hallpike as indicated, vestibular rehab   Shaunda Menke, PT 04/29/2022, 11:46 AM   [facility] [address], [address] [place], [place] [address] Phone # [phone] Fax [phone] 

## 2022-05-01 ENCOUNTER — Ambulatory Visit (INDEPENDENT_AMBULATORY_CARE_PROVIDER_SITE_OTHER): Payer: Medicare HMO | Admitting: Nurse Practitioner

## 2022-05-01 ENCOUNTER — Encounter: Payer: Self-pay | Admitting: Nurse Practitioner

## 2022-05-01 VITALS — BP 138/92 | HR 80 | Wt 242.4 lb

## 2022-05-01 DIAGNOSIS — R5383 Other fatigue: Secondary | ICD-10-CM

## 2022-05-01 DIAGNOSIS — N3281 Overactive bladder: Secondary | ICD-10-CM

## 2022-05-01 DIAGNOSIS — R404 Transient alteration of awareness: Secondary | ICD-10-CM

## 2022-05-01 DIAGNOSIS — R42 Dizziness and giddiness: Secondary | ICD-10-CM

## 2022-05-01 DIAGNOSIS — G4700 Insomnia, unspecified: Secondary | ICD-10-CM | POA: Diagnosis not present

## 2022-05-01 DIAGNOSIS — E2839 Other primary ovarian failure: Secondary | ICD-10-CM

## 2022-05-01 DIAGNOSIS — E1169 Type 2 diabetes mellitus with other specified complication: Secondary | ICD-10-CM | POA: Diagnosis not present

## 2022-05-01 DIAGNOSIS — I10 Essential (primary) hypertension: Secondary | ICD-10-CM | POA: Diagnosis not present

## 2022-05-01 DIAGNOSIS — E559 Vitamin D deficiency, unspecified: Secondary | ICD-10-CM

## 2022-05-01 DIAGNOSIS — M6281 Muscle weakness (generalized): Secondary | ICD-10-CM | POA: Diagnosis not present

## 2022-05-01 DIAGNOSIS — G4733 Obstructive sleep apnea (adult) (pediatric): Secondary | ICD-10-CM | POA: Diagnosis not present

## 2022-05-01 NOTE — Patient Instructions (Addendum)
To decrease losartan to 25 mg daily  To monitor blood pressure, notify if staying greater than 140/90

## 2022-05-01 NOTE — Progress Notes (Signed)
Careteam: Patient Care Team: Lauree Chandler, NP as PCP - General (Geriatric Medicine) Berniece Salines, DO as PCP - Cardiology (Cardiology) Murlean Iba, MD as Referring Physician (Orthopedic Surgery) Park Liter, MD as Consulting Physician (Cardiology)  PLACE OF SERVICE:  [facility] Directive information Does Patient Have a Medical Advance Directive?: Yes, Type of Advance Directive: [place];Living will;Out of facility DNR (pink MOST or yellow form), Pre-existing out of facility DNR order (yellow form or pink MOST form): Pink MOST/Yellow Form most recent copy in chart - Physician notified to receive inpatient order;Pink MOST form placed in chart (order not valid for inpatient use), Does patient want to make changes to medical advance directive?: No - Patient declined  Allergies  Allergen Reactions   Amlodipine Swelling   Aripiprazole Other (See Comments)    Tongue twisted, lips curled up Tongue twisted, lips curled up    Carvedilol Other (See Comments)    Nightmares Nightmares    Hydralazine     Diarrhea, abdominal pain   Irbesartan     dizziness   Losartan     dizzy   Meclizine Other (See Comments)    Dizziness    Paroxetine Hcl Other (See Comments)    . Marland Kitchen    Tizanidine Other (See Comments)    Syncope Syncope    Tramadol Other (See Comments)    Unknown   Zolpidem Other (See Comments)    Sleepwalking Sleepwalking    Zyrtec [Cetirizine Hcl]     Dizziness    Chief Complaint  Patient presents with   Medical Management of Chronic Issues    Routine follow up. There are no record on this patient in [facility]. Discuss need for foot exam and Flu vaccine. Patient complains of Vertigo, extreme fatigue and not sleeping, also frequent urination,     HPI: Patient is a 76 y.o. female   Ongoing vertigo. She has weakness and fatigue. Feels very debilitated.   She states she has confusion and word finding difficulty  during her verigo episodes. States her dizziness is so bad that it is limiting her movement, driving, and ADL's. Has become a lot more sedentary and is putting on weight. Is now having problems solving simple puzzles that she could previously do in her sleep. Saw a doctor years ago that thought she may be having mini-strokes.   Has an appointment with audiologist and neurologist in the first week of October. She is participating in physical therapist for dizziness but not helping symptoms.   Previously she was on b12 supplements but has stopped taking recently?b12 def.   States she only gets about 2-3 hours of sleep a night. Continues on melatonin and trazadone. She gets up frequently through the night to use the bathroom.   She has tried mybetriq in the past for overactive bladder but the medication did not help and medication was expensive.   She knows that she has sleep apnea but does not wear CPAP due to the difficulty of getting it on and off. Questions if there are updated masks that are less difficult.   Has gained about 10 pounds since the last visit.  States she has been a lot more sedentary. Partly due to vertigo and patly due to fatigue     Review of Systems:  Review of Systems  Constitutional:  Positive for malaise/fatigue. Negative for chills, fever and weight loss.  HENT:  Positive for tinnitus. Negative for congestion, ear pain and hearing loss.  Eyes:  Positive for blurred vision.  Respiratory:  Negative for cough and shortness of breath.   Cardiovascular:  Negative for chest pain, palpitations and leg swelling.  Gastrointestinal:  Negative for abdominal pain, constipation, diarrhea, heartburn and nausea.  Genitourinary:  Negative for dysuria and hematuria.  Skin:  Negative for itching and rash.  Neurological:  Positive for dizziness and weakness. Negative for tingling.  Psychiatric/Behavioral:  Negative for depression. The patient is not nervous/anxious.     Past  Medical History:  Diagnosis Date   Abnormal EKG 10/23/2016   Arthritis 01/18/2020   Chronic fatigue 10/08/2015   Depression 01/18/2020   Diabetes mellitus ([place]) 10/08/2015   Formatting of this note might be different from the original. Overview:  last Hgb A1C 7.2/no blood sugars at home Formatting of this note might be different from the original. last Hgb A1C 7.2/no blood sugars at home   ESR raised 11/27/2016   Frequent urination 12/16/2018   Glaucoma 10/08/2015   High cholesterol    History of pancreatitis 07/11/2014   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/23/2016   Hypertension 10/08/2015   IFG (impaired fasting glucose) 10/24/2019   Incomplete emptying of bladder 11/30/2017   Knee pain, left 12/22/2016   Loss of memory 01/18/2020   Major depressive disorder in partial remission ([place]) 10/08/2015   Morbid obesity ([place]) 10/23/2016   Obesity (BMI 30-39.9) 11/10/2016   Obstructive sleep apnea 12/16/2018   Osteoarthritis of both knees 11/27/2016   Other insomnia 03/05/2017   Palpitations 10/23/2016   Positive ANA (antinuclear antibody) 10/23/2016   Sleep apnea 10/08/2015   Formatting of this note might be different from the original. Overview:  does not use CPAP Formatting of this note might be different from the original. does not use CPAP   Sore neck 10/24/2019   Spondylosis of cervical spine with myelopathy 01/18/2020   Type 2 diabetes mellitus without complication, without long-term current use of insulin ([place]) 12/16/2018   Vitamin D deficiency 10/08/2015   Past Surgical History:  Procedure Laterality Date   APPENDECTOMY     age 49   CESAREAN SECTION     X [place]  2015   SPINE SURGERY  05/29/2015   Social History:   reports that she has quit smoking. Her smoking use included cigarettes. She has a 10.00 pack-year smoking history. She has never used smokeless tobacco. She reports that she does not currently use alcohol. She reports that she does not currently use drugs.  Family History  Problem Relation Age of  Onset   Diabetes Mother    Multiple sclerosis Mother    Heart disease Father    Heart Problems Father    Migraines Daughter    Thyroid disease Daughter    Migraines Daughter     Medications: Patient's Medications  New Prescriptions   No medications on file  Previous Medications   AMLODIPINE (NORVASC) 5 MG TABLET    Take 1 tablet (5 mg total) by mouth daily.   IBUPROFEN 200 MG CAPS    Take by mouth as needed.   LATANOPROST (XALATAN) 0.005 % OPHTHALMIC SOLUTION    Place 1 drop into both eyes at bedtime.   MAGNESIUM GLYCINATE PO    Take 350 mg by mouth.   MELATONIN 5 MG TABS    Take by mouth at bedtime. 1-2 tablets   OVER THE COUNTER MEDICATION    daily. Essential Oils   POLYETHYLENE GLYCOL (MIRALAX / GLYCOLAX) 17 G PACKET    Take 17  g by mouth daily.   TRAZODONE (DESYREL) 100 MG TABLET    Take 2 tablets (200 mg total) by mouth at bedtime.  Modified Medications   No medications on file  Discontinued Medications   No medications on file    Physical Exam:  Vitals:   05/01/22 1425  BP: (!) 138/92  Pulse: 80  SpO2: 91%  Weight: 242 lb 6.4 oz (110 kg)   Body mass index is 37.97 kg/m. Wt Readings from Last 3 Encounters:  05/01/22 242 lb 6.4 oz (110 kg)  03/09/22 232 lb (105.2 kg)  12/15/21 232 lb 6.4 oz (105.4 kg)    Physical Exam Constitutional:      General: She is not in acute distress.    Appearance: She is obese.  HENT:     Right Ear: Tympanic membrane, ear canal and external ear normal. There is no impacted cerumen.     Left Ear: Tympanic membrane, ear canal and external ear normal. There is no impacted cerumen.     Mouth/Throat:     Mouth: Mucous membranes are moist.     Pharynx: Oropharynx is clear.  Eyes:     Conjunctiva/sclera: Conjunctivae normal.     Pupils: Pupils are equal, round, and reactive to light.  Cardiovascular:     Rate and Rhythm: Normal rate and regular rhythm.     Pulses: Normal pulses.     Heart sounds: Normal heart sounds. No murmur  heard. Abdominal:     General: Bowel sounds are normal. There is no distension.     Palpations: Abdomen is soft.     Tenderness: There is no abdominal tenderness.  Musculoskeletal:     Right lower leg: Edema present.     Left lower leg: Edema present.     Comments: Bilateral ankles, trace non-pitting  Skin:    General: Skin is warm and dry.  Neurological:     Mental Status: She is alert and oriented to person, place, and time. Mental status is at baseline.     Motor: No weakness.     Comments: No focal weakness  Psychiatric:        Mood and Affect: Mood normal.        Behavior: Behavior normal.     Labs reviewed: Basic Metabolic Panel: Recent Labs    11/17/21 1509  NA 142  K 4.1  CL 107  CO2 25  GLUCOSE 126  BUN 22  CREATININE 0.94  CALCIUM 9.2   Liver Function Tests: Recent Labs    11/17/21 1509  AST 14  ALT 15  BILITOT 0.3  PROT 6.7   No results for input(s): "LIPASE", "AMYLASE" in the last 8760 hours. No results for input(s): "AMMONIA" in the last 8760 hours. CBC: Recent Labs    11/17/21 1509  WBC 6.3  NEUTROABS 3,226  HGB 13.5  HCT 39.8  MCV 94.5  PLT 247   Lipid Panel: Recent Labs    11/17/21 1509  CHOL 204*  HDL 54  LDLCALC 118*  TRIG 199*  CHOLHDL 3.8   TSH: No results for input(s): "TSH" in the last 8760 hours. A1C: Lab Results  Component Value Date   HGBA1C 6.1 (H) 11/17/2021     Assessment/Plan 1. Vertigo - continued problems - continue physical therapy - keep ENT and neurology appointments - decrease losartan at this time to see if there is improval in symptoms - CMP with eGFR([facility]) - TSH - CBC with Differential/Platelet - CT HEAD WO CONTRAST (5MM); Future -  Vitamin B12 - Vitamin D, 25-hydroxy  2. Primary hypertension - controlled - decrease losartan at this time to see if that helps with vertigo, keep blood pressure log at home to monitor for hypertension - CBC with Differential/Platelet - CT HEAD WO CONTRAST  (5MM); Future - Vitamin D, 25-hydroxy  3. Type 2 diabetes mellitus with other specified complication, without long-term current use of insulin (HCC) - encourage healthy eating - Hemoglobin A1c - Vitamin D, 25-hydroxy  4. Overactive bladder - will hold off on starting new medications at this time while we work on getting vertigo symptoms controlled - has tried mybetric in the past without improvement, consider gemtesa in the future.  - avoid caffeine and excessive fluid in the evening hours  5. Transient alteration of awareness - expressive aphasia and confusion with vertigo. Also having increase feeling of fatigue during these episodes.  - TSH - CT HEAD WO CONTRAST (5MM); Future for further evaluation  - Vitamin B12 - Vitamin D, 25-hydroxy  6. Estrogen deficiency - Vitamin D, 25-hydroxy  7. Insomnia, unspecified type - remains on trazodone and melatonin - avoid caffeine and excessive fluid in the evening hours  8. OSA (obstructive sleep apnea) - does not wear CPAP due to difficulty applying and having to re-apply multiple times throughout the night.  - pulmonology referral to discuss different options for mask  9. Fatigue, unspecified type Ongoing, worse when vertigo is worse. Labs today  10. Vitamin D deficiency -vit d level.   Return in about 3 months (around 07/31/2022) for routine follow up .:  Student- Waunita Schooner, RN I personally was present during the history, physical exam and medical decision-making activities of this service and have verified that the service and findings are accurately documented in the student's note  Keilyn Nadal K. [place], [facility] [phone]

## 2022-05-02 LAB — CBC WITH DIFFERENTIAL/PLATELET
Absolute Monocytes: 709 cells/uL (ref 200–950)
Basophils Absolute: 33 cells/uL (ref 0–200)
Basophils Relative: 0.5 %
Eosinophils Absolute: 150 cells/uL (ref 15–500)
Eosinophils Relative: 2.3 %
HCT: 42 % (ref 35.0–45.0)
Hemoglobin: 14.2 g/dL (ref 11.7–15.5)
Lymphs Abs: 1892 cells/uL (ref 850–3900)
MCH: 31.8 pg (ref 27.0–33.0)
MCHC: 33.8 g/dL (ref 32.0–36.0)
MCV: 94 fL (ref 80.0–100.0)
MPV: 10.3 fL (ref 7.5–12.5)
Monocytes Relative: 10.9 %
Neutro Abs: 3718 cells/uL (ref 1500–7800)
Neutrophils Relative %: 57.2 %
Platelets: 237 10*3/uL (ref 140–400)
RBC: 4.47 10*6/uL (ref 3.80–5.10)
RDW: 12.6 % (ref 11.0–15.0)
Total Lymphocyte: 29.1 %
WBC: 6.5 10*3/uL (ref 3.8–10.8)

## 2022-05-02 LAB — COMPLETE METABOLIC PANEL WITH GFR
AG Ratio: 1.8 (calc) (ref 1.0–2.5)
ALT: 21 U/L (ref 6–29)
AST: 15 U/L (ref 10–35)
Albumin: 4.4 g/dL (ref 3.6–5.1)
Alkaline phosphatase (APISO): 59 U/L (ref 37–153)
BUN: 23 mg/dL (ref 7–25)
CO2: 23 mmol/L (ref 20–32)
Calcium: 9.1 mg/dL (ref 8.6–10.4)
Chloride: 106 mmol/L (ref 98–110)
Creat: 0.85 mg/dL (ref 0.60–1.00)
Globulin: 2.5 g/dL (calc) (ref 1.9–3.7)
Glucose, Bld: 121 mg/dL (ref 65–139)
Potassium: 4.1 mmol/L (ref 3.5–5.3)
Sodium: 140 mmol/L (ref 135–146)
Total Bilirubin: 0.4 mg/dL (ref 0.2–1.2)
Total Protein: 6.9 g/dL (ref 6.1–8.1)
eGFR: 71 mL/min/{1.73_m2} (ref >=60)

## 2022-05-02 LAB — TEST AUTHORIZATION: TEST NAME:: 927

## 2022-05-02 LAB — HEMOGLOBIN A1C
Hgb A1c MFr Bld: 5.9 % of total Hgb — ABNORMAL HIGH (ref <5.7)
Mean Plasma Glucose: 123 mg/dL
eAG (mmol/L): 6.8 mmol/L

## 2022-05-02 LAB — TSH: TSH: 2.82 mIU/L (ref 0.40–4.50)

## 2022-05-02 LAB — VITAMIN D 25 HYDROXY (VIT D DEFICIENCY, FRACTURES): Vit D, 25-Hydroxy: 65 ng/mL (ref 30–100)

## 2022-05-05 ENCOUNTER — Encounter: Payer: Medicare HMO | Admitting: Rehabilitative and Restorative Service Providers"

## 2022-05-07 ENCOUNTER — Encounter: Payer: Self-pay | Admitting: Rehabilitative and Restorative Service Providers"

## 2022-05-07 ENCOUNTER — Ambulatory Visit: Payer: Medicare HMO | Attending: Nurse Practitioner | Admitting: Rehabilitative and Restorative Service Providers"

## 2022-05-07 DIAGNOSIS — R42 Dizziness and giddiness: Secondary | ICD-10-CM | POA: Diagnosis not present

## 2022-05-07 DIAGNOSIS — R252 Cramp and spasm: Secondary | ICD-10-CM | POA: Diagnosis not present

## 2022-05-07 DIAGNOSIS — M6281 Muscle weakness (generalized): Secondary | ICD-10-CM | POA: Diagnosis not present

## 2022-05-07 DIAGNOSIS — R2689 Other abnormalities of gait and mobility: Secondary | ICD-10-CM | POA: Diagnosis not present

## 2022-05-07 NOTE — Therapy (Signed)
OUTPATIENT PHYSICAL THERAPY TREATMENT NOTE     Patient Name: Brenda Mcfarland MRN: [id number] DOB:[birth year], 76 y.o., female Today's Date: 05/07/2022  PCP: Lauree Chandler, NP REFERRING PROVIDER: Lauree Chandler, NP   PT End of Session - 05/07/22 1103     Visit Number 5    Date for PT Re-Evaluation 05/29/22    Authorization Type [employer]    Progress Note Due on Visit 10    PT Start Time 1100    PT Stop Time 1140    PT Time Calculation (min) 40 min    Activity Tolerance Patient tolerated treatment well    Behavior During Therapy [facility] for tasks assessed/performed             Past Medical History:  Diagnosis Date   Abnormal EKG 10/23/2016   Arthritis 01/18/2020   Chronic fatigue 10/08/2015   Depression 01/18/2020   Diabetes mellitus ([place]) 10/08/2015   Formatting of this note might be different from the original. Overview:  last Hgb A1C 7.2/no blood sugars at home Formatting of this note might be different from the original. last Hgb A1C 7.2/no blood sugars at home   ESR raised 11/27/2016   Frequent urination 12/16/2018   Glaucoma 10/08/2015   High cholesterol    History of pancreatitis 07/11/2014   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/23/2016   Hypertension 10/08/2015   IFG (impaired fasting glucose) 10/24/2019   Incomplete emptying of bladder 11/30/2017   Knee pain, left 12/22/2016   Loss of memory 01/18/2020   Major depressive disorder in partial remission ([place]) 10/08/2015   Morbid obesity ([place]) 10/23/2016   Obesity (BMI 30-39.9) 11/10/2016   Obstructive sleep apnea 12/16/2018   Osteoarthritis of both knees 11/27/2016   Other insomnia 03/05/2017   Palpitations 10/23/2016   Positive ANA (antinuclear antibody) 10/23/2016   Sleep apnea 10/08/2015   Formatting of this note might be different from the original. Overview:  does not use CPAP Formatting of this note might be different from the original. does not use CPAP   Sore neck 10/24/2019   Spondylosis of cervical spine with myelopathy 01/18/2020    Type 2 diabetes mellitus without complication, without long-term current use of insulin ([place]) 12/16/2018   Vitamin D deficiency 10/08/2015   Past Surgical History:  Procedure Laterality Date   APPENDECTOMY     age 28   CESAREAN SECTION     X [place]  2015   SPINE SURGERY  05/29/2015   Patient Active Problem List   Diagnosis Date Noted   Paroxysmal SVT (supraventricular tachycardia) ([place]) 01/22/2020   Arthritis 01/18/2020   Depression 01/18/2020   Loss of memory 01/18/2020   Spondylosis of cervical spine with myelopathy 01/18/2020   IFG (impaired fasting glucose) 10/24/2019   Sore neck 10/24/2019   Frequent urination 12/16/2018   Obstructive sleep apnea 12/16/2018   Type 2 diabetes mellitus without complication, without long-term current use of insulin ([place]) 12/16/2018   Incomplete emptying of bladder 11/30/2017   Other insomnia 03/05/2017   Knee pain, left 12/22/2016   ESR raised 11/27/2016   Osteoarthritis of both knees 11/27/2016   Obesity (BMI 30-39.9) 11/10/2016   Abnormal EKG 10/23/2016   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/23/2016   Morbid obesity ([place]) 10/23/2016   Palpitations 10/23/2016   Positive ANA (antinuclear antibody) 10/23/2016   Hypertension 10/08/2015   Chronic fatigue 10/08/2015   Diabetes mellitus ([place]) 10/08/2015   Glaucoma 10/08/2015   Major depressive disorder in partial remission ([place]) 10/08/2015   Vitamin D  deficiency 10/08/2015   Sleep apnea 10/08/2015   History of pancreatitis 07/11/2014    ONSET DATE: June 17/2023  REFERRING DIAG: R42 (ICD-10-CM) - Vertigo   THERAPY DIAG:  Dizziness and giddiness  Balance problem  Other abnormalities of gait and mobility  Muscle weakness (generalized)  Cramp and spasm  Rationale for Evaluation and Treatment Rehabilitation  SUBJECTIVE:   SUBJECTIVE STATEMENT: Pt reports that she is not having dizziness in bed any longer, but has been having some overall dizziness at all times.    PERTINENT HISTORY:  history neck pain/surgery, tinnitis, chronic LBP, recent COVID infection with brain fog, glaucoma, obesity, bil knee OA, T2DM   PAIN:  Are you having pain? Yes: NPRS scale: 3/10 Pain location: cervical Pain description: sharp Aggravating factors: worse at night Relieving factors: unknown  PRECAUTIONS: Fall secondary to dizziness  WEIGHT BEARING RESTRICTIONS No  FALLS: Has patient fallen in last 6 months? No  LIVING ENVIRONMENT: Lives with: lives alone Lives in: House/apartment Stairs: Yes: Internal: 2 steps; on right going up and External: 3 steps; bilateral but cannot reach both Has following equipment at home: Single point cane  PLOF: Independent  PATIENT GOALS:  To be able to drive and go out in the community again independently.  OBJECTIVE:   DIAGNOSTIC FINDINGS: n/a  COGNITION: Overall cognitive status: Within functional limits for tasks assessed    SENSATION: WFL  POSTURE: rounded shoulders   Cervical ROM:    Active A/ROM (deg) eval  Flexion 25  Extension 25  Right lateral flexion 20  Left lateral flexion 15  Right rotation 40  Left rotation 38  (Blank rows = not tested)  STRENGTH:  04/07/2022: UE strength is WFL. LE strength grossly 4/5   GAIT: Gait pattern: step to pattern, antalgic, and wide BOS Distance walked: 50 ft Assistive device utilized: Single point cane Level of assistance: SBA Comments: Pt requires close SBA with minimally unsteady gait pattern noted.  FUNCTIONAL TESTs:  5 times sit to stand: 19.2 sec with UE use required Timed up and go (TUG): 21.4 sec without assistive device  PATIENT SURVEYS:   04/21/2022: BERG Balance 34/56  04/07/2022: DHI Total Score: 74 / 100 Physical Score: 16 / 28 Emotional Score: 24 / 36 Functional Score: 34 / 36  VESTIBULAR ASSESSMENT   GENERAL OBSERVATION: Pt is known to this PT from a previous episode ending in February 2023.    SYMPTOM BEHAVIOR:   Subjective history: Pt was doing better  following discharge in February, however, since mid June, pt has been noticing worsening dizziness.   Non-Vestibular symptoms: changes in vision and nausea/vomiting   Type of dizziness: Oscillopsia, Unsteady with head/body turns, and "World moves"   Frequency: near daily basis now   Duration: for at least 4 weeks   Aggravating factors: Induced by position change: rolling to the right and rolling to the left and Induced by motion: looking up at the ceiling, turning body quickly, turning head quickly, and driving   Relieving factors: rest and slow movements   Progression of symptoms: worse   OCULOMOTOR EXAM:   Ocular Alignment: normal   Ocular ROM: No Limitations   Spontaneous Nystagmus: absent   Gaze-Induced Nystagmus: absent   Smooth Pursuits: intact   Saccades: intact   Convergence/Divergence: pt with some difficulty with convergence     VESTIBULAR - OCULAR REFLEX:    Head thrust test:  positive primarily with turns to right side    POSITIONAL TESTING: Right Dix-Hallpike: right geotropic nystagmus noted Left  Dix-Hallpike: very minimal nystagmus noted with "feeling weird" reported    OTHOSTATICS: not done   VESTIBULAR TREATMENT:  TODAY'S TREATMENT:  05/07/2022: Nustep level 5 x6 min with PT present to discuss status Standing on blue foam for static stance x1 min Standing on blue foam performing ball toss 2x1 min Standing on blue foam performing head turns and head nods x1 min each Ambulation down PT hallway performing head turns and head nods 2 laps each Chin Tucks and scapular retraction 2x10 each Standing on trampoline:  marching, bouncing, side to side weight shift.  X1 min each Rocker board x1 min  04/29/2022: Dix Hallpike negative bilaterally Pencil pushups 2x10 with cuing to slow down Chin Tucks and scapular retraction 2x10 each Visual tracking of target for ABC's  Standing performing head turns horizontal and vertical x1 min each Seated:  LAQ, marching, hip  adduction ball squeeze.  BLE 2x10  04/22/2022: Nustep level 4 x7 min with PT present to discuss status Seated with 2# ankle weights:  LAQ, marching, hip abduction clams with red tband, hip adduction ball squeeze.  2x10 each bilat Seated hamstring curls red tband 2x10 each Sit to/from stand holding 5# kettlebell:  x10 with chest press, x10 with overhead press Cervical rotation 2x10 bilat Upper trap stretch 2x20 sec bilat Chin Tucks and scapular retraction 2x10 each Tandem gait down barre x3 laps each Standing hip abduction at barre x10 bilat    PATIENT EDUCATION: Education details: Pt and daughter issued HEP Person educated: Patient and Child(ren) Education method: Explanation, Demonstration, and Handouts Education comprehension: verbalized understanding and returned demonstration  HOME EXERCISE PROGRAM: Access Code: [email or web address] URL: [email or web address] Date: 04/29/2022 Prepared by: Shelby Dubin Don Tiu  Exercises - Pencil Pushups  - 1 x daily - 7 x weekly - 3 sets - 10 reps - Seated Vertical Smooth Pursuit  - 1 x daily - 7 x weekly - 3 sets - 10 reps - Seated Diagonal Smooth Pursuit  - 1 x daily - 7 x weekly - 3 sets - 10 reps - Seated Cervical Retraction  - 1 x daily - 7 x weekly - 2 sets - 10 reps - Seated Scapular Retraction  - 1 x daily - 7 x weekly - 2 sets - 10 reps - Standing with Head Rotation  - 1 x daily - 7 x weekly - 3 sets - 10 reps - Standing with Head Nod  - 1 x daily - 7 x weekly - 3 sets - 10 reps - Supine Knee Extension Strengthening  - 1 x daily - 7 x weekly - 2 sets - 10 reps - Seated Long Arc Quad  - 1 x daily - 7 x weekly - 2 sets - 10 reps - Seated March  - 1 x daily - 7 x weekly - 2 sets - 10 reps - Seated Hip Adduction Isometrics with Ball  - 1 x daily - 7 x weekly - 2 sets - 10 reps   GOALS: Goals reviewed with patient? Yes  SHORT TERM GOALS: Target date: 04/28/2022   Independent with initial HEP. Baseline: Goal status: MET  2.  Pt will  be able to participate in a BERG balance assessment. Baseline:  Goal status: MET   LONG TERM GOALS: Target date: 05/29/2022    Pt will be independent with advanced HEP. Baseline:  Goal status: IN PROGRESS  2.  Improve walking tolerance to at least 800' without limitation from back/leg pain or dizziness/imbalance.  Baseline:  Goal status: INITIAL  3.  Patient will improve score on BERG to at least 50/56 to decrease risk of falls. Baseline:  Goal status: INITIAL  4.  Pt. will report 60% improvement in vestibular impairment.  Baseline:  Goal status: INITIAL  5.  Pt will increase bilateral LE strength to at least 4+/5 to allow pt to negotiate stairs with increased ease. Baseline: 4/5 Goal status: INITIAL   ASSESSMENT:  CLINICAL IMPRESSION: Ms Abbruzzese presents to skilled rehabilitation with some increased dizziness reported today in sitting or standing.  Pt encouraged to perform exercises daily to improve vestibular habituation.  Pt able to perform session today without a loss of balance, but did have some minor self corrected balance "checks" with standing on foam performing ball toss.  Pt continues to require skilled PT to progress towards goal related activities.   OBJECTIVE IMPAIRMENTS decreased balance, decreased strength, dizziness, increased muscle spasms, impaired flexibility, and pain.   ACTIVITY LIMITATIONS bending, standing, and locomotion level  PARTICIPATION LIMITATIONS: cleaning, laundry, and community activity  PERSONAL FACTORS Past/current experiences, Time since onset of injury/illness/exacerbation, and 3+ comorbidities: history neck pain/surgery, tinnitis, chronic LBP, previous COVID infection with brain fog, glaucoma, obesity, bil knee OA, T2DM  are also affecting patient's functional outcome.   REHAB POTENTIAL: Good  CLINICAL DECISION MAKING: Evolving/moderate complexity  EVALUATION COMPLEXITY: Moderate   PLAN: PT FREQUENCY: 1-2x/week  PT DURATION: 8  weeks  PLANNED INTERVENTIONS: Therapeutic exercises, Therapeutic activity, Neuromuscular re-education, Balance training, Gait training, Patient/Family education, Self Care, Joint mobilization, Joint manipulation, Stair training, Vestibular training, Canalith repositioning, Aquatic Therapy, Dry Needling, Spinal manipulation, Spinal mobilization, Cryotherapy, Moist heat, Taping, Ionotophoresis 34m/ml Dexamethasone, Manual therapy, and Re-evaluation  PLAN FOR NEXT SESSION: progress and assess HEP as indicated, Dix-Hallpike as indicated, vestibular rehab   SLa Huerta PT 05/07/2022, 11:52 AM   [facility] [place] [id number] # [phone] [phone]

## 2022-05-11 ENCOUNTER — Encounter: Payer: Self-pay | Admitting: Rehabilitative and Restorative Service Providers"

## 2022-05-11 ENCOUNTER — Ambulatory Visit: Payer: Medicare HMO | Admitting: Rehabilitative and Restorative Service Providers"

## 2022-05-11 DIAGNOSIS — M6281 Muscle weakness (generalized): Secondary | ICD-10-CM | POA: Diagnosis not present

## 2022-05-11 DIAGNOSIS — R42 Dizziness and giddiness: Secondary | ICD-10-CM | POA: Diagnosis not present

## 2022-05-11 DIAGNOSIS — R252 Cramp and spasm: Secondary | ICD-10-CM | POA: Diagnosis not present

## 2022-05-11 DIAGNOSIS — R2689 Other abnormalities of gait and mobility: Secondary | ICD-10-CM | POA: Diagnosis not present

## 2022-05-11 NOTE — Therapy (Signed)
OUTPATIENT PHYSICAL THERAPY TREATMENT NOTE     Patient Name: Brenda Mcfarland MRN: [id number] DOB:1946-07-03, 76 y.o., female Today's Date: 05/11/2022  PCP: Lauree Chandler, NP REFERRING PROVIDER: Lauree Chandler, NP   PT End of Session - 05/11/22 1026     Visit Number 6    Date for PT Re-Evaluation 05/29/22    Authorization Type [employer]    Progress Note Due on Visit 10    PT Start Time 1017    PT Stop Time 1055    PT Time Calculation (min) 38 min    Activity Tolerance Patient tolerated treatment well    Behavior During Therapy WFL for tasks assessed/performed             Past Medical History:  Diagnosis Date   Abnormal EKG 10/23/2016   Arthritis 01/18/2020   Chronic fatigue 10/08/2015   Depression 01/18/2020   Diabetes mellitus ([place]) 10/08/2015   Formatting of this note might be different from the original. Overview:  last Hgb A1C 7.2/no blood sugars at home Formatting of this note might be different from the original. last Hgb A1C 7.2/no blood sugars at home   ESR raised 11/27/2016   Frequent urination 12/16/2018   Glaucoma 10/08/2015   High cholesterol    History of pancreatitis 07/11/2014   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/23/2016   Hypertension 10/08/2015   IFG (impaired fasting glucose) 10/24/2019   Incomplete emptying of bladder 11/30/2017   Knee pain, left 12/22/2016   Loss of memory 01/18/2020   Major depressive disorder in partial remission ([place]) 10/08/2015   Morbid obesity ([place]) 10/23/2016   Obesity (BMI 30-39.9) 11/10/2016   Obstructive sleep apnea 12/16/2018   Osteoarthritis of both knees 11/27/2016   Other insomnia 03/05/2017   Palpitations 10/23/2016   Positive ANA (antinuclear antibody) 10/23/2016   Sleep apnea 10/08/2015   Formatting of this note might be different from the original. Overview:  does not use CPAP Formatting of this note might be different from the original. does not use CPAP   Sore neck 10/24/2019   Spondylosis of cervical spine with myelopathy 01/18/2020    Type 2 diabetes mellitus without complication, without long-term current use of insulin ([place]) 12/16/2018   Vitamin D deficiency 10/08/2015   Past Surgical History:  Procedure Laterality Date   APPENDECTOMY     age 27   CESAREAN SECTION     X [place]  2015   SPINE SURGERY  05/29/2015   Patient Active Problem List   Diagnosis Date Noted   Paroxysmal SVT (supraventricular tachycardia) ([place]) 01/22/2020   Arthritis 01/18/2020   Depression 01/18/2020   Loss of memory 01/18/2020   Spondylosis of cervical spine with myelopathy 01/18/2020   IFG (impaired fasting glucose) 10/24/2019   Sore neck 10/24/2019   Frequent urination 12/16/2018   Obstructive sleep apnea 12/16/2018   Type 2 diabetes mellitus without complication, without long-term current use of insulin ([place]) 12/16/2018   Incomplete emptying of bladder 11/30/2017   Other insomnia 03/05/2017   Knee pain, left 12/22/2016   ESR raised 11/27/2016   Osteoarthritis of both knees 11/27/2016   Obesity (BMI 30-39.9) 11/10/2016   Abnormal EKG 10/23/2016   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/23/2016   Morbid obesity ([place]) 10/23/2016   Palpitations 10/23/2016   Positive ANA (antinuclear antibody) 10/23/2016   Hypertension 10/08/2015   Chronic fatigue 10/08/2015   Diabetes mellitus ([place]) 10/08/2015   Glaucoma 10/08/2015   Major depressive disorder in partial remission ([place]) 10/08/2015   Vitamin D  deficiency 10/08/2015   Sleep apnea 10/08/2015   History of pancreatitis 07/11/2014    ONSET DATE: June 17/2023  REFERRING DIAG: R42 (ICD-10-CM) - Vertigo   THERAPY DIAG:  Dizziness and giddiness  Balance problem  Other abnormalities of gait and mobility  Muscle weakness (generalized)  Cramp and spasm  Rationale for Evaluation and Treatment Rehabilitation  SUBJECTIVE:   SUBJECTIVE STATEMENT: Pt reports some dizziness yesterday, but states that her dizziness is feeling better today.  Reports that she ordered a foam pad for use at  home.   PERTINENT HISTORY: history neck pain/surgery, tinnitis, chronic LBP, recent COVID infection with brain fog, glaucoma, obesity, bil knee OA, T2DM   PAIN:  Are you having pain? Yes: NPRS scale: 1-2/10 Pain location: cervical Pain description: sharp Aggravating factors: worse at night Relieving factors: unknown  PRECAUTIONS: Fall secondary to dizziness  WEIGHT BEARING RESTRICTIONS No  FALLS: Has patient fallen in last 6 months? No  LIVING ENVIRONMENT: Lives with: lives alone Lives in: House/apartment Stairs: Yes: Internal: 2 steps; on right going up and External: 3 steps; bilateral but cannot reach both Has following equipment at home: Single point cane  PLOF: Independent  PATIENT GOALS:  To be able to drive and go out in the community again independently.  OBJECTIVE:   DIAGNOSTIC FINDINGS: n/a  COGNITION: Overall cognitive status: Within functional limits for tasks assessed    SENSATION: WFL  POSTURE: rounded shoulders   Cervical ROM:    Active A/ROM (deg) eval  Flexion 25  Extension 25  Right lateral flexion 20  Left lateral flexion 15  Right rotation 40  Left rotation 38  (Blank rows = not tested)  STRENGTH:  04/07/2022: UE strength is WFL. LE strength grossly 4/5   GAIT: Gait pattern: step to pattern, antalgic, and wide BOS Distance walked: 50 ft Assistive device utilized: Single point cane Level of assistance: SBA Comments: Pt requires close SBA with minimally unsteady gait pattern noted.  FUNCTIONAL TESTs:  5 times sit to stand: 19.2 sec with UE use required Timed up and go (TUG): 21.4 sec without assistive device  PATIENT SURVEYS:   04/21/2022: BERG Balance 34/56  04/07/2022: DHI Total Score: 74 / 100 Physical Score: 16 / 28 Emotional Score: 24 / 36 Functional Score: 34 / 36  VESTIBULAR ASSESSMENT   GENERAL OBSERVATION: Pt is known to this PT from a previous episode ending in February 2023.    SYMPTOM BEHAVIOR:   Subjective  history: Pt was doing better following discharge in February, however, since mid June, pt has been noticing worsening dizziness.   Non-Vestibular symptoms: changes in vision and nausea/vomiting   Type of dizziness: Oscillopsia, Unsteady with head/body turns, and "World moves"   Frequency: near daily basis now   Duration: for at least 4 weeks   Aggravating factors: Induced by position change: rolling to the right and rolling to the left and Induced by motion: looking up at the ceiling, turning body quickly, turning head quickly, and driving   Relieving factors: rest and slow movements   Progression of symptoms: worse   OCULOMOTOR EXAM:   Ocular Alignment: normal   Ocular ROM: No Limitations   Spontaneous Nystagmus: absent   Gaze-Induced Nystagmus: absent   Smooth Pursuits: intact   Saccades: intact   Convergence/Divergence: pt with some difficulty with convergence     VESTIBULAR - OCULAR REFLEX:    Head thrust test:  positive primarily with turns to right side    POSITIONAL TESTING: Right Dix-Hallpike: right geotropic  nystagmus noted Left Dix-Hallpike: very minimal nystagmus noted with "feeling weird" reported    OTHOSTATICS: not done   VESTIBULAR TREATMENT:  TODAY'S TREATMENT:  05/11/2022: Nustep level 5 x6 min with PT present to discuss status Chin Tucks and scapular retraction 2x10 each Bouncing purple ball against wall x20 throws with visual tracking of ball Standing on blue foam for static stance x1 min Standing on blue foam performing ball toss x1 min Standing on blue foam performing head turns and head nods x1 min each Standing on trampoline:  marching, bouncing, side to side weight shift.  X1 min each Ambulation down PT hallway performing head turns and head nods 2 laps each  05/07/2022: Nustep level 5 x6 min with PT present to discuss status Standing on blue foam for static stance x1 min Standing on blue foam performing ball toss 2x1 min Standing on blue foam  performing head turns and head nods x1 min each Ambulation down PT hallway performing head turns and head nods 2 laps each Chin Tucks and scapular retraction 2x10 each Standing on trampoline:  marching, bouncing, side to side weight shift.  X1 min each Rocker board x1 min  04/29/2022: Dix Hallpike negative bilaterally Pencil pushups 2x10 with cuing to slow down Chin Tucks and scapular retraction 2x10 each Visual tracking of target for ABC's  Standing performing head turns horizontal and vertical x1 min each Seated:  LAQ, marching, hip adduction ball squeeze.  BLE 2x10    PATIENT EDUCATION: Education details: Pt and daughter issued HEP Person educated: Patient and Child(ren) Education method: Explanation, Demonstration, and Handouts Education comprehension: verbalized understanding and returned demonstration  HOME EXERCISE PROGRAM: Access Code: [email or web address] URL: [email or web address] Date: 04/29/2022 Prepared by: Shelby Dubin Diamone Whistler  Exercises - Pencil Pushups  - 1 x daily - 7 x weekly - 3 sets - 10 reps - Seated Vertical Smooth Pursuit  - 1 x daily - 7 x weekly - 3 sets - 10 reps - Seated Diagonal Smooth Pursuit  - 1 x daily - 7 x weekly - 3 sets - 10 reps - Seated Cervical Retraction  - 1 x daily - 7 x weekly - 2 sets - 10 reps - Seated Scapular Retraction  - 1 x daily - 7 x weekly - 2 sets - 10 reps - Standing with Head Rotation  - 1 x daily - 7 x weekly - 3 sets - 10 reps - Standing with Head Nod  - 1 x daily - 7 x weekly - 3 sets - 10 reps - Supine Knee Extension Strengthening  - 1 x daily - 7 x weekly - 2 sets - 10 reps - Seated Long Arc Quad  - 1 x daily - 7 x weekly - 2 sets - 10 reps - Seated March  - 1 x daily - 7 x weekly - 2 sets - 10 reps - Seated Hip Adduction Isometrics with Ball  - 1 x daily - 7 x weekly - 2 sets - 10 reps   GOALS: Goals reviewed with patient? Yes  SHORT TERM GOALS: Target date: 04/28/2022   Independent with initial  HEP. Baseline: Goal status: MET  2.  Pt will be able to participate in a BERG balance assessment. Baseline:  Goal status: MET   LONG TERM GOALS: Target date: 05/29/2022    Pt will be independent with advanced HEP. Baseline:  Goal status: IN PROGRESS  2.  Improve walking tolerance to at least 800' without limitation from back/leg pain or dizziness/imbalance.  Baseline:  Goal status: INITIAL  3.  Patient will improve score on BERG to at least 50/56 to decrease risk of falls. Baseline:  Goal status: INITIAL  4.  Pt. will report 60% improvement in vestibular impairment.  Baseline:  Goal status: INITIAL  5.  Pt will increase bilateral LE strength to at least 4+/5 to allow pt to negotiate stairs with increased ease. Baseline: 4/5 Goal status: INITIAL   ASSESSMENT:  CLINICAL IMPRESSION: Ms Erck presents to skilled rehabilitation reporting that she is feeling better.  Pt able to progress with exercises throughout and reported decreased dizziness than last session.  Throughout, pt able to better maintain her balance without any losses of balance noted.  Pt continues to require skilled PT to progress vestibular symptoms.   OBJECTIVE IMPAIRMENTS decreased balance, decreased strength, dizziness, increased muscle spasms, impaired flexibility, and pain.   ACTIVITY LIMITATIONS bending, standing, and locomotion level  PARTICIPATION LIMITATIONS: cleaning, laundry, and community activity  PERSONAL FACTORS Past/current experiences, Time since onset of injury/illness/exacerbation, and 3+ comorbidities: history neck pain/surgery, tinnitis, chronic LBP, previous COVID infection with brain fog, glaucoma, obesity, bil knee OA, T2DM  are also affecting patient's functional outcome.   REHAB POTENTIAL: Good  CLINICAL DECISION MAKING: Evolving/moderate complexity  EVALUATION COMPLEXITY: Moderate   PLAN: PT FREQUENCY: 1-2x/week  PT DURATION: 8 weeks  PLANNED INTERVENTIONS: Therapeutic  exercises, Therapeutic activity, Neuromuscular re-education, Balance training, Gait training, Patient/Family education, Self Care, Joint mobilization, Joint manipulation, Stair training, Vestibular training, Canalith repositioning, Aquatic Therapy, Dry Needling, Spinal manipulation, Spinal mobilization, Cryotherapy, Moist heat, Taping, Ionotophoresis 34m/ml Dexamethasone, Manual therapy, and Re-evaluation  PLAN FOR NEXT SESSION: progress and assess HEP as indicated, Dix-Hallpike as indicated, vestibular rehab   [facility] 05/11/2022, [id number] [place] [id number] # [phone] [phone]

## 2022-05-13 ENCOUNTER — Encounter: Payer: Medicare HMO | Admitting: Rehabilitative and Restorative Service Providers"

## 2022-05-14 ENCOUNTER — Ambulatory Visit
Admission: RE | Admit: 2022-05-14 | Discharge: 2022-05-14 | Disposition: A | Discharge status: Home / Self Care | Payer: Medicare HMO | Source: Ambulatory Visit | Attending: Nurse Practitioner | Admitting: Nurse Practitioner

## 2022-05-14 ENCOUNTER — Encounter: Payer: Self-pay | Admitting: Nurse Practitioner

## 2022-05-14 ENCOUNTER — Ambulatory Visit (INDEPENDENT_AMBULATORY_CARE_PROVIDER_SITE_OTHER): Payer: Medicare HMO | Admitting: Nurse Practitioner

## 2022-05-14 DIAGNOSIS — G4733 Obstructive sleep apnea (adult) (pediatric): Secondary | ICD-10-CM | POA: Diagnosis not present

## 2022-05-14 DIAGNOSIS — E669 Obesity, unspecified: Secondary | ICD-10-CM | POA: Diagnosis not present

## 2022-05-14 DIAGNOSIS — R404 Transient alteration of awareness: Secondary | ICD-10-CM

## 2022-05-14 DIAGNOSIS — G4700 Insomnia, unspecified: Secondary | ICD-10-CM

## 2022-05-14 DIAGNOSIS — R42 Dizziness and giddiness: Secondary | ICD-10-CM | POA: Diagnosis not present

## 2022-05-14 DIAGNOSIS — R351 Nocturia: Secondary | ICD-10-CM

## 2022-05-14 DIAGNOSIS — I10 Essential (primary) hypertension: Secondary | ICD-10-CM

## 2022-05-14 NOTE — Progress Notes (Unsigned)
@Patient  ID: Brenda Mcfarland, female    DOB: 08-30-1946, 76 y.o.   MRN: [id number]  Chief Complaint  Patient presents with   Consult    Pt is here for consult for OSA. Pt states she does snore, issues falling asleep even with sleep medications, issues getting up and down. Been occurring for years. Sleep study done many years ago. Been on cpap in the past but does not wear it.     Referring provider: Lauree Chandler, NP  HPI: 76 year old female, former smoker referred for sleep consult.  Past medical history significant for OSA not on CPAP, hypertension, paroxysmal SVT, DM 2, osteoarthritis, chronic fatigue, insomnia, obesity, vertigo.  TEST/EVENTS:   05/14/2022: Today-sleep consult Patient presents today with her daughter for sleep consult referred by Sherrie Mustache, NP.  She has a history of sleep apnea and was previously on CPAP.  She stopped wearing it years ago.  She had been struggling with getting up to urinate frequently at night and got frustrated with having to take the mask on and off.  Every time she would have to contract the mask and put it back on it would wake her up more and cause her issues falling back asleep.  She also felt like her pressure was not set correctly.  Felt like she was not getting enough air at times.  She has been struggling with daytime fatigue symptoms since.  She only gets about 3-1/2 to 4 hours of sleep at night because she feels like she cannot stay asleep and it takes her a while to fall asleep.  She wakes up often to use the bathroom.  She has been tried on a few medicines for this with no success.  She had evaluation with urology and they did not feel that she had anything structurally wrong.  She wakes up tired in the morning.  Denies any witnessed apneas, morning headaches, drowsy driving, sleep parasomnia/paralysis.  No history of narcolepsy or cataplexy. She is currently on trazodone for insomnia.  Does not seem to be doing much for her.  She has  tried doses up to 200 mg.  Still struggles to fall and stay asleep. Her weight has increased over the past 2 years, especially the past few months.  She has been dealing with vertigo so has not been as active as she normally is.  She does admit that she struggles with drinking enough water throughout the day.  She probably has 1 or 2 cups.  Otherwise she drinks caffeine free Pepsi.  Bedtime: Time to fall asleep:  Wake time: Night awakenings: Symptoms: snoring, apneas, morning headache, drowsy driving, sleep parasomnias/paralysis, narcolepsy/cataplexy Sleep assistance?  Caffeine? Occupation?  Weight?  Previous Sleep studies?   Allergies  Allergen Reactions   Amlodipine Swelling   Aripiprazole Other (See Comments)    Tongue twisted, lips curled up Tongue twisted, lips curled up    Carvedilol Other (See Comments)    Nightmares Nightmares    Hydralazine     Diarrhea, abdominal pain   Irbesartan     dizziness   Losartan     dizzy   Meclizine Other (See Comments)    Dizziness    Paroxetine Hcl Other (See Comments)    . Marland Kitchen    Tizanidine Other (See Comments)    Syncope Syncope    Tramadol Other (See Comments)    Unknown   Zolpidem Other (See Comments)    Sleepwalking Sleepwalking    Zyrtec [Cetirizine Hcl]  Dizziness    Immunization History  Administered Date(s) Administered   Tdap 06/30/2018    Past Medical History:  Diagnosis Date   Abnormal EKG 10/23/2016   Arthritis 01/18/2020   Chronic fatigue 10/08/2015   Depression 01/18/2020   Diabetes mellitus ([place]) 10/08/2015   Formatting of this note might be different from the original. Overview:  last Hgb A1C 7.2/no blood sugars at home Formatting of this note might be different from the original. last Hgb A1C 7.2/no blood sugars at home   ESR raised 11/27/2016   Frequent urination 12/16/2018   Glaucoma 10/08/2015   High cholesterol    History of pancreatitis 07/11/2014   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/23/2016   Hypertension  10/08/2015   IFG (impaired fasting glucose) 10/24/2019   Incomplete emptying of bladder 11/30/2017   Knee pain, left 12/22/2016   Loss of memory 01/18/2020   Major depressive disorder in partial remission ([place]) 10/08/2015   Morbid obesity ([place]) 10/23/2016   Obesity (BMI 30-39.9) 11/10/2016   Obstructive sleep apnea 12/16/2018   Osteoarthritis of both knees 11/27/2016   Other insomnia 03/05/2017   Palpitations 10/23/2016   Positive ANA (antinuclear antibody) 10/23/2016   Sleep apnea 10/08/2015   Formatting of this note might be different from the original. Overview:  does not use CPAP Formatting of this note might be different from the original. does not use CPAP   Sore neck 10/24/2019   Spondylosis of cervical spine with myelopathy 01/18/2020   Type 2 diabetes mellitus without complication, without long-term current use of insulin ([place]) 12/16/2018   Vitamin D deficiency 10/08/2015    Tobacco History: Social History   Tobacco Use  Smoking Status Former   Packs/day: 1.00   Years: 10.00   Total pack years: 10.00   Types: Cigarettes  Smokeless Tobacco Never   Counseling given: Not Answered   Outpatient Medications Prior to Visit  Medication Sig Dispense Refill   amLODipine (NORVASC) 5 MG tablet Take 5 mg by mouth every other day.     Ibuprofen 200 MG CAPS Take by mouth as needed.     latanoprost (XALATAN) 0.005 % ophthalmic solution Place 1 drop into both eyes at bedtime.     losartan (COZAAR) 50 MG tablet Take 50 mg by mouth daily.     MAGNESIUM GLYCINATE PO Take 350 mg by mouth.     melatonin 5 MG TABS Take by mouth at bedtime. 1-2 tablets     OVER THE COUNTER MEDICATION daily. Essential Oils     polyethylene glycol (MIRALAX / GLYCOLAX) 17 g packet Take 17 g by mouth daily.     traZODone (DESYREL) 100 MG tablet Take 100 mg by mouth at bedtime.     No facility-administered medications prior to visit.     Review of Systems:   Constitutional: No  night sweats, fevers, chills, or lassitude.  +weight gain, fatigue HEENT: No headaches, difficulty swallowing, tooth/dental problems, or sore throat. No sneezing, itching, ear ache, nasal congestion, or post nasal drip CV:  No chest pain, orthopnea, PND, swelling in lower extremities, anasarca, palpitations, syncope Resp: No shortness of breath with exertion or at rest. No excess mucus or change in color of mucus. No productive or non-productive. No hemoptysis. No wheezing.  No chest wall deformity GI:  +heartburn, indigestion. No abdominal pain, nausea, vomiting, diarrhea, change in bowel habits, loss of appetite, bloody stools.  GU: +nocturia. No dysuria, change in color of urine, urgency or frequency.  No flank pain, no hematuria  Skin: No  rash, lesions, ulcerations MSK:  No joint pain or swelling.  No decreased range of motion.  No back pain. Neuro: +dizziness. No gait abnormalities. No memory impairment.  Psych: No depression or anxiety. Mood stable.     Physical Exam:  BP 124/68 (BP Location: Right Arm, Patient Position: Sitting, Cuff Size: Normal)   Pulse 78   Ht 5' 7"  (1.702 m)   Wt 243 lb 12.8 oz (110.6 kg)   SpO2 95%   BMI 38.18 kg/m   GEN: Pleasant, interactive, well-appearing; obese; in no acute distress. HEENT:  Normocephalic and atraumatic. PERRLA. Sclera white. Nasal turbinates pink, moist and patent bilaterally. No rhinorrhea present. Oropharynx pink and moist, without exudate or edema. No lesions, ulcerations, or postnasal drip. Mallampati III NECK:  Supple w/ fair ROM. No JVD present. Normal carotid impulses w/o bruits. Thyroid symmetrical with no goiter or nodules palpated. No lymphadenopathy.   CV: RRR, no m/r/g, no peripheral edema. Pulses intact, +2 bilaterally. No cyanosis, pallor or clubbing. PULMONARY:  Unlabored, regular breathing. Clear bilaterally A&P w/o wheezes/rales/rhonchi. No accessory muscle use. No dullness to percussion. GI: BS present and normoactive. Soft, non-tender to palpation. No  organomegaly or masses detected. No CVA tenderness. MSK: No erythema, warmth or tenderness. Cap refil <2 sec all extrem. No deformities or joint swelling noted.  Neuro: A/Ox3. No focal deficits noted.   Skin: Warm, no lesions or rashe Psych: Normal affect and behavior. Judgement and thought content appropriate.     Lab Results:  CBC    Component Value Date/Time   WBC 6.5 05/01/2022 1542   RBC 4.47 05/01/2022 1542   HGB 14.2 05/01/2022 1542   HCT 42.0 05/01/2022 1542   PLT 237 05/01/2022 1542   MCV 94.0 05/01/2022 1542   MCH 31.8 05/01/2022 1542   MCHC 33.8 05/01/2022 1542   RDW 12.6 05/01/2022 1542   LYMPHSABS 1,892 05/01/2022 1542   EOSABS 150 05/01/2022 1542   BASOSABS 33 05/01/2022 1542    BMET    Component Value Date/Time   NA 140 05/01/2022 1542   NA 140 09/04/2019 1600   K 4.1 05/01/2022 1542   CL 106 05/01/2022 1542   CO2 23 05/01/2022 1542   GLUCOSE 121 05/01/2022 1542   BUN 23 05/01/2022 1542   BUN 17 09/04/2019 1600   CREATININE 0.85 05/01/2022 1542   CALCIUM 9.1 05/01/2022 1542   GFRNONAA 76 09/04/2019 1600   GFRAA 88 09/04/2019 1600    BNP No results found for: "BNP"   Imaging:  CT HEAD WO CONTRAST (5MM)  Result Date: 05/14/2022 CLINICAL DATA:  Vertigo for 3 and half months EXAM: CT HEAD WITHOUT CONTRAST TECHNIQUE: Contiguous axial images were obtained from the base of the skull through the vertex without intravenous contrast. RADIATION DOSE REDUCTION: This exam was performed according to the departmental dose-optimization program which includes automated exposure control, adjustment of the mA and/or kV according to patient size and/or use of iterative reconstruction technique. COMPARISON:  None Available. FINDINGS: Brain: No intracranial hemorrhage, mass effect, or evidence of acute infarct. No hydrocephalus. No extra-axial fluid collection. Age commensurate generalized cerebral atrophy and chronic small vessel ischemic disease. Vascular: No hyperdense  vessel. Intracranial arterial calcification. Skull: No fracture or focal lesion. Sinuses/Orbits: No acute finding. Paranasal sinuses and mastoid air cells are well aerated. Other: None. IMPRESSION: No acute intracranial abnormality. Electronically Signed   By: Placido Sou M.D.   On: 05/14/2022 20:36          No data to display  No results found for: "NITRICOXIDE"      Assessment & Plan:   Obstructive sleep apnea She has a history of OSA, previously on CPAP but had trouble with tolerating it.  Given her increased weight gain, excessive daytime fatigue, nocturia, insomnia, frequent night awakenings, I have a high suspicion that she still has sleep disordered breathing with obstructive sleep apnea.  She will need a sleep study for further evaluation.  We discussed the correlation between her symptoms and untreated sleep apnea.  She would be agreeable to restarting on CPAP therapy if appropriate.  Patient Instructions  Given your symptoms and history, I am concerned that you have sleep disordered breathing with obstructive sleep apnea. You will need a sleep study for further evaluation. Someone will contact you for scheduling.   We discussed how untreated sleep apnea puts an individual at risk for cardiac arrhthymias, pulm HTN, DM, stroke and increases their risk for daytime accidents. We also briefly reviewed treatment options including weight loss, side sleeping position, oral appliance, CPAP therapy or referral to ENT for possible surgical options  Avoid driving if you become drowsy   Follow up in 6-8 weeks with Katie Maudene Stotler,NP or sooner if needed     Insomnia Longstanding history of insomnia.  She is currently taking trazodone with minimal relief.  She has been tried on Ambien in the past which also has not worked for her.  Discussed today that I would not want to adjust her medications prior to sleep study as this could worsen her untreated OSA.  We also discussed that  untreated sleep apnea can cause worsening insomniac symptoms.  We will reevaluate after her sleep study.  If there is no evidence of significant OSA, we can refer her to cognitive behavioral therapy for further management.  Nocturia Possibly related to untreated OSA.  She did experience frequent urination at night when she was on CPAP therapy; however it was not as severe as it is now.  If there is evidence of OSA and she is started on CPAP and still has symptoms, we will refer her back to urology or try medications for overactive bladder.  Obesity (BMI 30-39.9) Healthy weight loss encouraged.  She will let us know if she wants a referral to medical weight management.   I spent *** minutes of dedicated to the care of this patient on the date of this encounter to include pre-visit review of records, face-to-face time with the patient discussing conditions above, post visit ordering of testing, clinical documentation with the electronic health record, making appropriate referrals as documented, and communicating necessary findings to members of the patients care team.  Clayton Bibles, NP 05/15/2022  Pt aware and understands NP's role.

## 2022-05-14 NOTE — Patient Instructions (Signed)
Given your symptoms and history, I am concerned that you have sleep disordered breathing with obstructive sleep apnea. You will need a sleep study for further evaluation. Someone will contact you for scheduling.   We discussed how untreated sleep apnea puts an individual at risk for cardiac arrhthymias, pulm HTN, DM, stroke and increases their risk for daytime accidents. We also briefly reviewed treatment options including weight loss, side sleeping position, oral appliance, CPAP therapy or referral to ENT for possible surgical options  Avoid driving if you become drowsy   Follow up in 6-8 weeks with Brenda Addison Noella Kipnis,NP or sooner if needed

## 2022-05-15 DIAGNOSIS — R351 Nocturia: Secondary | ICD-10-CM | POA: Insufficient documentation

## 2022-05-15 NOTE — Assessment & Plan Note (Signed)
Longstanding history of insomnia.  She is currently taking trazodone with minimal relief.  She has been tried on Ambien in the past which also has not worked for her.  Discussed today that I would not want to adjust her medications prior to sleep study as this could worsen her untreated OSA.  We also discussed that untreated sleep apnea can cause worsening insomniac symptoms.  We will reevaluate after her sleep study.  If there is no evidence of significant OSA, we can refer her to cognitive behavioral therapy for further management.

## 2022-05-15 NOTE — Assessment & Plan Note (Signed)
She has a history of OSA, previously on CPAP but had trouble with tolerating it.  Given her increased weight gain, excessive daytime fatigue, nocturia, insomnia, frequent night awakenings, I have a high suspicion that she still has sleep disordered breathing with obstructive sleep apnea.  She will need a sleep study for further evaluation.  We discussed the correlation between her symptoms and untreated sleep apnea.  She would be agreeable to restarting on CPAP therapy if appropriate.  Patient Instructions  Given your symptoms and history, I am concerned that you have sleep disordered breathing with obstructive sleep apnea. You will need a sleep study for further evaluation. Someone will contact you for scheduling.   We discussed how untreated sleep apnea puts an individual at risk for cardiac arrhthymias, pulm HTN, DM, stroke and increases their risk for daytime accidents. We also briefly reviewed treatment options including weight loss, side sleeping position, oral appliance, CPAP therapy or referral to ENT for possible surgical options  Avoid driving if you become drowsy   Follow up in 6-8 weeks with Brenda Addison Kenita Bines,NP or sooner if needed

## 2022-05-15 NOTE — Assessment & Plan Note (Signed)
Healthy weight loss encouraged.  She will let us know if she wants a referral to medical weight management.

## 2022-05-15 NOTE — Assessment & Plan Note (Signed)
Possibly related to untreated OSA.  She did experience frequent urination at night when she was on CPAP therapy; however it was not as severe as it is now.  If there is evidence of OSA and she is started on CPAP and still has symptoms, we will refer her back to urology or try medications for overactive bladder.

## 2022-05-18 ENCOUNTER — Encounter: Payer: Self-pay | Admitting: Rehabilitative and Restorative Service Providers"

## 2022-05-18 ENCOUNTER — Ambulatory Visit: Payer: Medicare HMO | Admitting: Rehabilitative and Restorative Service Providers"

## 2022-05-18 ENCOUNTER — Encounter: Payer: Self-pay | Admitting: Nurse Practitioner

## 2022-05-18 DIAGNOSIS — M6281 Muscle weakness (generalized): Secondary | ICD-10-CM | POA: Diagnosis not present

## 2022-05-18 DIAGNOSIS — R2689 Other abnormalities of gait and mobility: Secondary | ICD-10-CM | POA: Diagnosis not present

## 2022-05-18 DIAGNOSIS — R252 Cramp and spasm: Secondary | ICD-10-CM | POA: Diagnosis not present

## 2022-05-18 DIAGNOSIS — R42 Dizziness and giddiness: Secondary | ICD-10-CM

## 2022-05-18 NOTE — Progress Notes (Signed)
Reviewed and agree with assessment/plan.   Dmarion Perfect, MD [facility] 05/18/2022, 10:40 PM Pager:  [phone]  

## 2022-05-18 NOTE — Therapy (Signed)
OUTPATIENT PHYSICAL THERAPY TREATMENT NOTE     Patient Name: Brenda Mcfarland MRN: [id number] DOB:June 06, 1946, 76 y.o., female Today's Date: 05/18/2022  PCP: Lauree Chandler, NP REFERRING PROVIDER: Lauree Chandler, NP   PT End of Session - 05/18/22 1236     Visit Number 7    Date for PT Re-Evaluation 05/29/22    Authorization Type [employer]    Progress Note Due on Visit 10    PT Start Time 1230    PT Stop Time 1310    PT Time Calculation (min) 40 min    Activity Tolerance Patient tolerated treatment well    Behavior During Therapy [facility] for tasks assessed/performed             Past Medical History:  Diagnosis Date   Abnormal EKG 10/23/2016   Arthritis 01/18/2020   Chronic fatigue 10/08/2015   Depression 01/18/2020   Diabetes mellitus ([place]) 10/08/2015   Formatting of this note might be different from the original. Overview:  last Hgb A1C 7.2/no blood sugars at home Formatting of this note might be different from the original. last Hgb A1C 7.2/no blood sugars at home   ESR raised 11/27/2016   Frequent urination 12/16/2018   Glaucoma 10/08/2015   High cholesterol    History of pancreatitis 07/11/2014   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/23/2016   Hypertension 10/08/2015   IFG (impaired fasting glucose) 10/24/2019   Incomplete emptying of bladder 11/30/2017   Knee pain, left 12/22/2016   Loss of memory 01/18/2020   Major depressive disorder in partial remission ([place]) 10/08/2015   Morbid obesity ([place]) 10/23/2016   Obesity (BMI 30-39.9) 11/10/2016   Obstructive sleep apnea 12/16/2018   Osteoarthritis of both knees 11/27/2016   Other insomnia 03/05/2017   Palpitations 10/23/2016   Positive ANA (antinuclear antibody) 10/23/2016   Sleep apnea 10/08/2015   Formatting of this note might be different from the original. Overview:  does not use CPAP Formatting of this note might be different from the original. does not use CPAP   Sore neck 10/24/2019   Spondylosis of cervical spine with myelopathy 01/18/2020    Type 2 diabetes mellitus without complication, without long-term current use of insulin ([place]) 12/16/2018   Vitamin D deficiency 10/08/2015   Past Surgical History:  Procedure Laterality Date   APPENDECTOMY     age 31   CESAREAN SECTION     X [place]  2015   SPINE SURGERY  05/29/2015   Patient Active Problem List   Diagnosis Date Noted   Nocturia 05/15/2022   Paroxysmal SVT (supraventricular tachycardia) ([place]) 01/22/2020   Arthritis 01/18/2020   Depression 01/18/2020   Loss of memory 01/18/2020   Spondylosis of cervical spine with myelopathy 01/18/2020   IFG (impaired fasting glucose) 10/24/2019   Sore neck 10/24/2019   Frequent urination 12/16/2018   Obstructive sleep apnea 12/16/2018   Type 2 diabetes mellitus without complication, without long-term current use of insulin ([place]) 12/16/2018   Incomplete emptying of bladder 11/30/2017   Insomnia 03/05/2017   Knee pain, left 12/22/2016   ESR raised 11/27/2016   Osteoarthritis of both knees 11/27/2016   Obesity (BMI 30-39.9) 11/10/2016   Abnormal EKG 10/23/2016   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/23/2016   Morbid obesity ([place]) 10/23/2016   Palpitations 10/23/2016   Positive ANA (antinuclear antibody) 10/23/2016   Hypertension 10/08/2015   Chronic fatigue 10/08/2015   Diabetes mellitus ([place]) 10/08/2015   Glaucoma 10/08/2015   Major depressive disorder in partial remission ([place]) 10/08/2015  Vitamin D deficiency 10/08/2015   Sleep apnea 10/08/2015   History of pancreatitis 07/11/2014    ONSET DATE: June 17/2023  REFERRING DIAG: R42 (ICD-10-CM) - Vertigo   THERAPY DIAG:  Dizziness and giddiness  Balance problem  Other abnormalities of gait and mobility  Muscle weakness (generalized)  Cramp and spasm  Rationale for Evaluation and Treatment Rehabilitation  SUBJECTIVE:   SUBJECTIVE STATEMENT: Pt reports that she has gotten the foam pad and has been doing some exercises on it.  Pt reports that she had increased  dizziness yesterday and states that today is not as bad.  States some dizziness/off balance when she rolled over in bed.    PERTINENT HISTORY: history neck pain/surgery, tinnitis, chronic LBP, recent COVID infection with brain fog, glaucoma, obesity, bil knee OA, T2DM   PAIN:  Are you having pain? Yes: NPRS scale: 2/10 Pain location: cervical Pain description: sharp Aggravating factors: worse at night Relieving factors: unknown  PRECAUTIONS: Fall secondary to dizziness  WEIGHT BEARING RESTRICTIONS No  FALLS: Has patient fallen in last 6 months? No  LIVING ENVIRONMENT: Lives with: lives alone Lives in: House/apartment Stairs: Yes: Internal: 2 steps; on right going up and External: 3 steps; bilateral but cannot reach both Has following equipment at home: Single point cane  PLOF: Independent  PATIENT GOALS:  To be able to drive and go out in the community again independently.  OBJECTIVE:   DIAGNOSTIC FINDINGS: n/a  COGNITION: Overall cognitive status: Within functional limits for tasks assessed    SENSATION: WFL  POSTURE: rounded shoulders   Cervical ROM:    Active A/ROM (deg) eval  Flexion 25  Extension 25  Right lateral flexion 20  Left lateral flexion 15  Right rotation 40  Left rotation 38  (Blank rows = not tested)  STRENGTH:  04/07/2022: UE strength is WFL. LE strength grossly 4/5   GAIT: Gait pattern: step to pattern, antalgic, and wide BOS Distance walked: 50 ft Assistive device utilized: Single point cane Level of assistance: SBA Comments: Pt requires close SBA with minimally unsteady gait pattern noted.  FUNCTIONAL TESTs:  5 times sit to stand: 19.2 sec with UE use required Timed up and go (TUG): 21.4 sec without assistive device  PATIENT SURVEYS:   04/21/2022: BERG Balance 34/56  04/07/2022: DHI Total Score: 74 / 100 Physical Score: 16 / 28 Emotional Score: 24 / 36 Functional Score: 34 / 36  VESTIBULAR ASSESSMENT   GENERAL  OBSERVATION: Pt is known to this PT from a previous episode ending in February 2023.    SYMPTOM BEHAVIOR:   Subjective history: Pt was doing better following discharge in February, however, since mid June, pt has been noticing worsening dizziness.   Non-Vestibular symptoms: changes in vision and nausea/vomiting   Type of dizziness: Oscillopsia, Unsteady with head/body turns, and "World moves"   Frequency: near daily basis now   Duration: for at least 4 weeks   Aggravating factors: Induced by position change: rolling to the right and rolling to the left and Induced by motion: looking up at the ceiling, turning body quickly, turning head quickly, and driving   Relieving factors: rest and slow movements   Progression of symptoms: worse   OCULOMOTOR EXAM:   Ocular Alignment: normal   Ocular ROM: No Limitations   Spontaneous Nystagmus: absent   Gaze-Induced Nystagmus: absent   Smooth Pursuits: intact   Saccades: intact   Convergence/Divergence: pt with some difficulty with convergence     VESTIBULAR - OCULAR REFLEX:  Head thrust test:  positive primarily with turns to right side    POSITIONAL TESTING: Right Dix-Hallpike: right geotropic nystagmus noted Left Dix-Hallpike: very minimal nystagmus noted with "feeling weird" reported    OTHOSTATICS: not done   VESTIBULAR TREATMENT:  TODAY'S TREATMENT:  05/18/2022: Nustep level 5 x6 min with PT present to discuss status Marye Round with some reported dizziness to right, unable to assess for nystagmus secondary to pt having eyes closed Proceeded with Epley Maneuver to treat right side Soft Tissue mobilization to cervical spine and cervical paraspinals, upper trap, suboccipitals and levator with gentle cervical manual traction Upper trap and levator stretch 2x20 sec bilat Chin Tucks and scapular retraction 2x10 each Piriformis stretch and hamstring stretch in sitting x20 sec each bilat   05/11/2022: Nustep level 5 x6 min with PT  present to discuss status Chin Tucks and scapular retraction 2x10 each Bouncing purple ball against wall x20 throws with visual tracking of ball Standing on blue foam for static stance x1 min Standing on blue foam performing ball toss x1 min Standing on blue foam performing head turns and head nods x1 min each Standing on trampoline:  marching, bouncing, side to side weight shift.  X1 min each Ambulation down PT hallway performing head turns and head nods 2 laps each  05/07/2022: Nustep level 5 x6 min with PT present to discuss status Standing on blue foam for static stance x1 min Standing on blue foam performing ball toss 2x1 min Standing on blue foam performing head turns and head nods x1 min each Ambulation down PT hallway performing head turns and head nods 2 laps each Chin Tucks and scapular retraction 2x10 each Standing on trampoline:  marching, bouncing, side to side weight shift.  X1 min each Rocker board x1 min    PATIENT EDUCATION: Education details: Pt and daughter issued HEP Person educated: Patient and Child(ren) Education method: Explanation, Demonstration, and Handouts Education comprehension: verbalized understanding and returned demonstration  HOME EXERCISE PROGRAM: Access Code: [email or web address] URL: [email or web address] Date: 05/18/2022 Prepared by: Shelby Dubin Mileena Rothenberger  Exercises - Pencil Pushups  - 1 x daily - 7 x weekly - 3 sets - 10 reps - Seated Vertical Smooth Pursuit  - 1 x daily - 7 x weekly - 3 sets - 10 reps - Seated Diagonal Smooth Pursuit  - 1 x daily - 7 x weekly - 3 sets - 10 reps - Seated Cervical Retraction  - 1 x daily - 7 x weekly - 2 sets - 10 reps - Seated Scapular Retraction  - 1 x daily - 7 x weekly - 2 sets - 10 reps - Standing with Head Rotation  - 1 x daily - 7 x weekly - 3 sets - 10 reps - Standing with Head Nod  - 1 x daily - 7 x weekly - 3 sets - 10 reps - Supine Knee Extension Strengthening  - 1 x daily - 7 x weekly - 2 sets - 10  reps - Seated Long Arc Quad  - 1 x daily - 7 x weekly - 2 sets - 10 reps - Seated March  - 1 x daily - 7 x weekly - 2 sets - 10 reps - Seated Hip Adduction Isometrics with Ball  - 1 x daily - 7 x weekly - 2 sets - 10 reps - Seated Upper Trapezius Stretch  - 1 x daily - 7 x weekly - 1 sets - 2 reps - 20 sec hold - Seated Levator Scapulae Stretch  -  1 x daily - 7 x weekly - 1 sets - 2 reps - 20 sec hold - Seated Piriformis Stretch  - 1 x daily - 7 x weekly - 1 sets - 2 reps - 20 sec hold - Seated Table Hamstring Stretch  - 1 x daily - 7 x weekly - 1 sets - 2 reps - 20 sec hold   GOALS: Goals reviewed with patient? Yes  SHORT TERM GOALS: Target date: 04/28/2022   Independent with initial HEP. Baseline: Goal status: MET  2.  Pt will be able to participate in a BERG balance assessment. Baseline:  Goal status: MET   LONG TERM GOALS: Target date: 05/29/2022    Pt will be independent with advanced HEP. Baseline:  Goal status: IN PROGRESS  2.  Improve walking tolerance to at least 800' without limitation from back/leg pain or dizziness/imbalance.  Baseline:  Goal status: INITIAL  3.  Patient will improve score on BERG to at least 50/56 to decrease risk of falls. Baseline:  Goal status: INITIAL  4.  Pt. will report 60% improvement in vestibular impairment.  Baseline:  Goal status: IN PROGRESS  5.  Pt will increase bilateral LE strength to at least 4+/5 to allow pt to negotiate stairs with increased ease. Baseline: 4/5 Goal status: INITIAL   ASSESSMENT:  CLINICAL IMPRESSION: Ms Mooty presents to skilled rehabilitation reporting that she had some dizziness when she rolled quickly towards her right side.  Pt reported dizziness with Marye Round, so proceeded with treatment with Epley maneuver.  Performed some manual therapy to cervical region, as pt was reporting some cervical pain and tightness today.  Followed manual therapy with cervical stretching and LE stretching.  Pt  continues to progress with overall strengthening and improved dynamic balance.  Pt continues to require skilled PT towards goal related activities.   OBJECTIVE IMPAIRMENTS decreased balance, decreased strength, dizziness, increased muscle spasms, impaired flexibility, and pain.   ACTIVITY LIMITATIONS bending, standing, and locomotion level  PARTICIPATION LIMITATIONS: cleaning, laundry, and community activity  PERSONAL FACTORS Past/current experiences, Time since onset of injury/illness/exacerbation, and 3+ comorbidities: history neck pain/surgery, tinnitis, chronic LBP, previous COVID infection with brain fog, glaucoma, obesity, bil knee OA, T2DM  are also affecting patient's functional outcome.   REHAB POTENTIAL: Good  CLINICAL DECISION MAKING: Evolving/moderate complexity  EVALUATION COMPLEXITY: Moderate   PLAN: PT FREQUENCY: 1-2x/week  PT DURATION: 8 weeks  PLANNED INTERVENTIONS: Therapeutic exercises, Therapeutic activity, Neuromuscular re-education, Balance training, Gait training, Patient/Family education, Self Care, Joint mobilization, Joint manipulation, Stair training, Vestibular training, Canalith repositioning, Aquatic Therapy, Dry Needling, Spinal manipulation, Spinal mobilization, Cryotherapy, Moist heat, Taping, Ionotophoresis 34m/ml Dexamethasone, Manual therapy, and Re-evaluation  PLAN FOR NEXT SESSION: progress and assess HEP as indicated, Dix-Hallpike as indicated, vestibular rehab   [place] 05/18/2022, 1:32 PM   [facility] [place]vertownGWe[place]auwe[place] [address]Phone # [phone] [phone]

## 2022-05-22 ENCOUNTER — Encounter: Payer: Medicare HMO | Admitting: Rehabilitative and Restorative Service Providers"

## 2022-05-26 ENCOUNTER — Ambulatory Visit: Payer: Medicare HMO | Admitting: Rehabilitative and Restorative Service Providers"

## 2022-05-27 ENCOUNTER — Telehealth: Payer: Self-pay | Admitting: Nurse Practitioner

## 2022-05-27 ENCOUNTER — Telehealth: Payer: Self-pay

## 2022-05-27 DIAGNOSIS — R42 Dizziness and giddiness: Secondary | ICD-10-CM

## 2022-05-27 DIAGNOSIS — G4733 Obstructive sleep apnea (adult) (pediatric): Secondary | ICD-10-CM

## 2022-05-27 NOTE — Telephone Encounter (Signed)
Patient's daughter called stating that the place where she was suppose to go for vertigo called told them that they would not be able to see her for another month. Daughter stated that she would like to have a referral sent some where else.   Initial referral was sent to [facility] and daughter states that she did not like it.   Message routed to Marlowe Sax, NP (covering provider).

## 2022-05-27 NOTE — Telephone Encounter (Signed)
Please resend referral  ENT order to another practice as requested.

## 2022-05-28 ENCOUNTER — Ambulatory Visit: Payer: Medicare HMO | Admitting: Rehabilitative and Restorative Service Providers"

## 2022-05-28 ENCOUNTER — Encounter: Payer: Self-pay | Admitting: Rehabilitative and Restorative Service Providers"

## 2022-05-28 DIAGNOSIS — R252 Cramp and spasm: Secondary | ICD-10-CM

## 2022-05-28 DIAGNOSIS — R42 Dizziness and giddiness: Secondary | ICD-10-CM

## 2022-05-28 DIAGNOSIS — M6281 Muscle weakness (generalized): Secondary | ICD-10-CM | POA: Diagnosis not present

## 2022-05-28 DIAGNOSIS — R2689 Other abnormalities of gait and mobility: Secondary | ICD-10-CM | POA: Diagnosis not present

## 2022-05-28 NOTE — Therapy (Signed)
OUTPATIENT PHYSICAL THERAPY TREATMENT NOTE AND DISCHARGE SUMMARY     Patient Name: Brenda Mcfarland MRN: [id number] DOB:12/02/1945, 76 y.o., female Today's Date: 05/28/2022  PCP: Lauree Chandler, NP REFERRING PROVIDER: Lauree Chandler, NP   PT End of Session - 05/28/22 1228     Visit Number 8    Date for PT Re-Evaluation 05/29/22    Authorization Type [employer]    Progress Note Due on Visit 10    PT Start Time 1226    PT Stop Time 1310    PT Time Calculation (min) 44 min    Activity Tolerance Patient tolerated treatment well    Behavior During Therapy [facility] for tasks assessed/performed             Past Medical History:  Diagnosis Date   Abnormal EKG 10/23/2016   Arthritis 01/18/2020   Chronic fatigue 10/08/2015   Depression 01/18/2020   Diabetes mellitus ([place]) 10/08/2015   Formatting of this note might be different from the original. Overview:  last Hgb A1C 7.2/no blood sugars at home Formatting of this note might be different from the original. last Hgb A1C 7.2/no blood sugars at home   ESR raised 11/27/2016   Frequent urination 12/16/2018   Glaucoma 10/08/2015   High cholesterol    History of pancreatitis 07/11/2014   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/23/2016   Hypertension 10/08/2015   IFG (impaired fasting glucose) 10/24/2019   Incomplete emptying of bladder 11/30/2017   Knee pain, left 12/22/2016   Loss of memory 01/18/2020   Major depressive disorder in partial remission ([place]) 10/08/2015   Morbid obesity ([place]) 10/23/2016   Obesity (BMI 30-39.9) 11/10/2016   Obstructive sleep apnea 12/16/2018   Osteoarthritis of both knees 11/27/2016   Other insomnia 03/05/2017   Palpitations 10/23/2016   Positive ANA (antinuclear antibody) 10/23/2016   Sleep apnea 10/08/2015   Formatting of this note might be different from the original. Overview:  does not use CPAP Formatting of this note might be different from the original. does not use CPAP   Sore neck 10/24/2019   Spondylosis of cervical spine with  myelopathy 01/18/2020   Type 2 diabetes mellitus without complication, without long-term current use of insulin ([place]) 12/16/2018   Vitamin D deficiency 10/08/2015   Past Surgical History:  Procedure Laterality Date   APPENDECTOMY     age 63   CESAREAN SECTION     X [place]  2015   SPINE SURGERY  05/29/2015   Patient Active Problem List   Diagnosis Date Noted   Nocturia 05/15/2022   Paroxysmal SVT (supraventricular tachycardia) ([place]) 01/22/2020   Arthritis 01/18/2020   Depression 01/18/2020   Loss of memory 01/18/2020   Spondylosis of cervical spine with myelopathy 01/18/2020   IFG (impaired fasting glucose) 10/24/2019   Sore neck 10/24/2019   Frequent urination 12/16/2018   Obstructive sleep apnea 12/16/2018   Type 2 diabetes mellitus without complication, without long-term current use of insulin ([place]) 12/16/2018   Incomplete emptying of bladder 11/30/2017   Insomnia 03/05/2017   Knee pain, left 12/22/2016   ESR raised 11/27/2016   Osteoarthritis of both knees 11/27/2016   Obesity (BMI 30-39.9) 11/10/2016   Abnormal EKG 10/23/2016   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/23/2016   Morbid obesity ([place]) 10/23/2016   Palpitations 10/23/2016   Positive ANA (antinuclear antibody) 10/23/2016   Hypertension 10/08/2015   Chronic fatigue 10/08/2015   Diabetes mellitus ([place]) 10/08/2015   Glaucoma 10/08/2015   Major depressive disorder in partial remission (  [place]) 10/08/2015   Vitamin D deficiency 10/08/2015   Sleep apnea 10/08/2015   History of pancreatitis 07/11/2014    ONSET DATE: June 17/2023  REFERRING DIAG: R42 (ICD-10-CM) - Vertigo   THERAPY DIAG:  Dizziness and giddiness  Balance problem  Other abnormalities of gait and mobility  Muscle weakness (generalized)  Cramp and spasm  Rationale for Evaluation and Treatment Rehabilitation  SUBJECTIVE:   SUBJECTIVE STATEMENT: Pt reports that she has overall been feeling better.  She did go to church and had some anxiety and  instability, but did not have a loss of balance.  Pt reports that she has been doing exercises on foam and the only exercise that causes imbalance is the walking with head turns.  PERTINENT HISTORY: history neck pain/surgery, tinnitis, chronic LBP, recent COVID infection with brain fog, glaucoma, obesity, bil knee OA, T2DM   PAIN:  Are you having pain? Yes: NPRS scale: 2/10 Pain location: cervical Pain description: sharp Aggravating factors: worse at night Relieving factors: unknown  PRECAUTIONS: Fall secondary to dizziness  WEIGHT BEARING RESTRICTIONS No  FALLS: Has patient fallen in last 6 months? No  LIVING ENVIRONMENT: Lives with: lives alone Lives in: House/apartment Stairs: Yes: Internal: 2 steps; on right going up and External: 3 steps; bilateral but cannot reach both Has following equipment at home: Single point cane  PLOF: Independent  PATIENT GOALS:  To be able to drive and go out in the community again independently.  OBJECTIVE:   DIAGNOSTIC FINDINGS: n/a  COGNITION: Overall cognitive status: Within functional limits for tasks assessed    SENSATION: WFL  POSTURE: rounded shoulders   Cervical ROM:    Active A/ROM (deg) eval  Flexion 25  Extension 25  Right lateral flexion 20  Left lateral flexion 15  Right rotation 40  Left rotation 38  (Blank rows = not tested)  STRENGTH:  04/07/2022: UE strength is WFL. LE strength grossly 4/5  05/28/2022: 5/5 grossly throughout   GAIT: Gait pattern: step to pattern, antalgic, and wide BOS Distance walked: 50 ft Assistive device utilized: Single point cane Level of assistance: SBA Comments: Pt requires close SBA with minimally unsteady gait pattern noted.  FUNCTIONAL TESTs:  04/07/2022: 5 times sit to stand: 19.2 sec with UE use required Timed up and go (TUG): 21.4 sec without assistive device  05/28/2022: Timed up and go (TUG): 13.5 sec without assistive device 5 times sit to stand: 19.2 sec with UE  use required  PATIENT SURVEYS:   04/21/2022: BERG Balance 34/56  04/07/2022: DHI Total Score: 74 / 100 Physical Score: 16 / 28 Emotional Score: 24 / 36 Functional Score: 34 / 36  05/28/2022: BERG Balance 53/56 DHI Total Score: 16 / 100 Physical Score: 2 / 28 Emotional Score: 10 / 36 Functional Score: 4 / 36    VESTIBULAR ASSESSMENT   GENERAL OBSERVATION: Pt is known to this PT from a previous episode ending in February 2023.    SYMPTOM BEHAVIOR:   Subjective history: Pt was doing better following discharge in February, however, since mid June, pt has been noticing worsening dizziness.   Non-Vestibular symptoms: changes in vision and nausea/vomiting   Type of dizziness: Oscillopsia, Unsteady with head/body turns, and "World moves"   Frequency: near daily basis now   Duration: for at least 4 weeks   Aggravating factors: Induced by position change: rolling to the right and rolling to the left and Induced by motion: looking up at the ceiling, turning body quickly, turning head quickly, and  driving   Relieving factors: rest and slow movements   Progression of symptoms: worse   OCULOMOTOR EXAM:   Ocular Alignment: normal   Ocular ROM: No Limitations   Spontaneous Nystagmus: absent   Gaze-Induced Nystagmus: absent   Smooth Pursuits: intact   Saccades: intact   Convergence/Divergence: pt with some difficulty with convergence     VESTIBULAR - OCULAR REFLEX:    Head thrust test:  positive primarily with turns to right side    POSITIONAL TESTING: Right Dix-Hallpike: right geotropic nystagmus noted Left Dix-Hallpike: very minimal nystagmus noted with "feeling weird" reported    OTHOSTATICS: not done   VESTIBULAR TREATMENT:  TODAY'S TREATMENT:  05/28/2022: BERG 53/56 Ambulation x1000 ft outside over various surfaces without any dizziness or loss of balance Nustep level 5 x6 min with PT present to discuss status Dizziness Handicapped Inventory 16/100 Leg Press 90#  2x10 Lat Pull 25# 2x10 Standing Rows 10# 2x10   05/18/2022: Nustep level 5 x6 min with PT present to discuss status [employer] with some reported dizziness to right, unable to assess for nystagmus secondary to pt having eyes closed Proceeded with Epley Maneuver to treat right side Soft Tissue mobilization to cervical spine and cervical paraspinals, upper trap, suboccipitals and levator with gentle cervical manual traction Upper trap and levator stretch 2x20 sec bilat Chin Tucks and scapular retraction 2x10 each Piriformis stretch and hamstring stretch in sitting x20 sec each bilat   05/11/2022: Nustep level 5 x6 min with PT present to discuss status Chin Tucks and scapular retraction 2x10 each Bouncing purple ball against wall x20 throws with visual tracking of ball Standing on blue foam for static stance x1 min Standing on blue foam performing ball toss x1 min Standing on blue foam performing head turns and head nods x1 min each Standing on trampoline:  marching, bouncing, side to side weight shift.  X1 min each Ambulation down PT hallway performing head turns and head nods 2 laps each    PATIENT EDUCATION: Education details: Pt and daughter issued HEP Person educated: Patient and Child(ren) Education method: Explanation, Demonstration, and Handouts Education comprehension: verbalized understanding and returned demonstration  HOME EXERCISE PROGRAM: Access Code: [email or web address] URL: [email or web address] Date: 05/18/2022 Prepared by: Shelby Dubin Karlena Luebke  Exercises - Pencil Pushups  - 1 x daily - 7 x weekly - 3 sets - 10 reps - Seated Vertical Smooth Pursuit  - 1 x daily - 7 x weekly - 3 sets - 10 reps - Seated Diagonal Smooth Pursuit  - 1 x daily - 7 x weekly - 3 sets - 10 reps - Seated Cervical Retraction  - 1 x daily - 7 x weekly - 2 sets - 10 reps - Seated Scapular Retraction  - 1 x daily - 7 x weekly - 2 sets - 10 reps - Standing with Head Rotation  - 1 x daily - 7 x  weekly - 3 sets - 10 reps - Standing with Head Nod  - 1 x daily - 7 x weekly - 3 sets - 10 reps - Supine Knee Extension Strengthening  - 1 x daily - 7 x weekly - 2 sets - 10 reps - Seated Long Arc Quad  - 1 x daily - 7 x weekly - 2 sets - 10 reps - Seated March  - 1 x daily - 7 x weekly - 2 sets - 10 reps - Seated Hip Adduction Isometrics with Ball  - 1 x daily - 7 x weekly - 2 sets -  10 reps - Seated Upper Trapezius Stretch  - 1 x daily - 7 x weekly - 1 sets - 2 reps - 20 sec hold - Seated Levator Scapulae Stretch  - 1 x daily - 7 x weekly - 1 sets - 2 reps - 20 sec hold - Seated Piriformis Stretch  - 1 x daily - 7 x weekly - 1 sets - 2 reps - 20 sec hold - Seated Table Hamstring Stretch  - 1 x daily - 7 x weekly - 1 sets - 2 reps - 20 sec hold   GOALS: Goals reviewed with patient? Yes  SHORT TERM GOALS: Target date: 04/28/2022   Independent with initial HEP. Baseline: Goal status: MET  2.  Pt will be able to participate in a BERG balance assessment. Baseline:  Goal status: MET   LONG TERM GOALS: Target date: 05/29/2022    Pt will be independent with advanced HEP. Baseline:  Goal status: MET  2.  Improve walking tolerance to at least 800' without limitation from back/leg pain or dizziness/imbalance.  Baseline:  Goal status: MET  3.  Patient will improve score on BERG to at least 50/56 to decrease risk of falls. Baseline:  Goal status: MET on 05/28/2022  4.  Pt. will report 60% improvement in vestibular impairment.  Baseline:  Goal status: MET  5.  Pt will increase bilateral LE strength to at least 4+/5 to allow pt to negotiate stairs with increased ease. Baseline: 4/5 Goal status: MET   ASSESSMENT:  CLINICAL IMPRESSION: Ms Sparano presents to skilled rehabilitation reporting that she is overall at least 60-75% better with dizziness symptoms.  Pt has met all therapeutic goals and is progressing with balance.  Pt only experiences dizziness with more advanced activities  at this time, otherwise, she is no longer having dizziness.  Pt reports that she is able to stand up from her sofa easier at home.  Pt discharged from outpatient PT at this time to continue with HEP.   OBJECTIVE IMPAIRMENTS decreased balance, decreased strength, dizziness, increased muscle spasms, impaired flexibility, and pain.   ACTIVITY LIMITATIONS bending, standing, and locomotion level  PARTICIPATION LIMITATIONS: cleaning, laundry, and community activity  PERSONAL FACTORS Past/current experiences, Time since onset of injury/illness/exacerbation, and 3+ comorbidities: history neck pain/surgery, tinnitis, chronic LBP, previous COVID infection with brain fog, glaucoma, obesity, bil knee OA, T2DM  are also affecting patient's functional outcome.   REHAB POTENTIAL: Good  CLINICAL DECISION MAKING: Evolving/moderate complexity  EVALUATION COMPLEXITY: Moderate   PLAN: PT FREQUENCY: 1-2x/week  PT DURATION: 8 weeks  PLANNED INTERVENTIONS: Therapeutic exercises, Therapeutic activity, Neuromuscular re-education, Balance training, Gait training, Patient/Family education, Self Care, Joint mobilization, Joint manipulation, Stair training, Vestibular training, Canalith repositioning, Aquatic Therapy, Dry Needling, Spinal manipulation, Spinal mobilization, Cryotherapy, Moist heat, Taping, Ionotophoresis 73m/ml Dexamethasone, Manual therapy, and Re-evaluation  PLAN FOR NEXT SESSION: Discharge on 05/28/2022   PHYSICAL THERAPY DISCHARGE SUMMARY   Patient agrees to discharge. Patient goals were met. Patient is being discharged due to meeting the stated rehab goals.    SJuel Burrow PT 05/28/2022, 1:22 PM   [facility] [place] [place] [id number] # [phone] [phone]

## 2022-05-29 NOTE — Telephone Encounter (Signed)
ENT referral reordered

## 2022-05-29 NOTE — Telephone Encounter (Signed)
Please place ENT referral to [facility].

## 2022-05-29 NOTE — Telephone Encounter (Signed)
Called and spoke with patient. She was calling to check on the status of her sleep study. I advised her that the sleep study was never scheduled after her OV. I apologized to her and assured her that I would go ahead and place the order today. She verbalized understanding.   Nothing further needed at time of call.

## 2022-06-09 ENCOUNTER — Other Ambulatory Visit: Payer: Self-pay

## 2022-06-09 MED ORDER — LOSARTAN POTASSIUM 25 MG PO TABS: 25.0000 mg | ORAL_TABLET | Freq: Every day | ORAL | 1 refills | Status: DC

## 2022-06-09 NOTE — Telephone Encounter (Signed)
Patient has request refill on medication Losartan 50mg . Patient medication has Allergy Contraindications. Patient medication pend and sent to PCP Dewaine Oats [employer], NP for approval.

## 2022-06-26 ENCOUNTER — Ambulatory Visit: Payer: Medicare HMO

## 2022-06-26 ENCOUNTER — Ambulatory Visit: Payer: Medicare HMO | Admitting: Nurse Practitioner

## 2022-06-26 DIAGNOSIS — G4733 Obstructive sleep apnea (adult) (pediatric): Secondary | ICD-10-CM | POA: Diagnosis not present

## 2022-07-01 ENCOUNTER — Telehealth: Payer: Self-pay | Admitting: Pulmonary Disease

## 2022-07-01 ENCOUNTER — Encounter: Payer: Self-pay | Admitting: Nurse Practitioner

## 2022-07-01 DIAGNOSIS — G4733 Obstructive sleep apnea (adult) (pediatric): Secondary | ICD-10-CM | POA: Diagnosis not present

## 2022-07-01 NOTE — Telephone Encounter (Signed)
   HST showed severe  OSA with AHI 46/ hr and low saturation of 77%  Consider CPAP titration study given prior intolerance

## 2022-07-02 ENCOUNTER — Encounter: Payer: Self-pay | Admitting: Nurse Practitioner

## 2022-07-02 DIAGNOSIS — R4789 Other speech disturbances: Secondary | ICD-10-CM | POA: Diagnosis not present

## 2022-07-02 DIAGNOSIS — H903 Sensorineural hearing loss, bilateral: Secondary | ICD-10-CM | POA: Diagnosis not present

## 2022-07-02 DIAGNOSIS — H9313 Tinnitus, bilateral: Secondary | ICD-10-CM | POA: Diagnosis not present

## 2022-07-02 DIAGNOSIS — R42 Dizziness and giddiness: Secondary | ICD-10-CM | POA: Diagnosis not present

## 2022-07-02 NOTE — Telephone Encounter (Signed)
Please schedule follow up to discuss results and treatment options.

## 2022-07-03 NOTE — Telephone Encounter (Signed)
Patient is currently scheduled for an OV on 11/13.

## 2022-07-07 ENCOUNTER — Encounter: Payer: Medicare HMO | Admitting: Nurse Practitioner

## 2022-07-09 ENCOUNTER — Encounter: Payer: Medicare HMO | Admitting: Nurse Practitioner

## 2022-07-09 ENCOUNTER — Encounter: Payer: Self-pay | Admitting: Orthopaedic Surgery

## 2022-07-09 ENCOUNTER — Ambulatory Visit (INDEPENDENT_AMBULATORY_CARE_PROVIDER_SITE_OTHER): Payer: Medicare HMO | Admitting: Orthopaedic Surgery

## 2022-07-09 ENCOUNTER — Telehealth: Payer: Self-pay | Admitting: Orthopaedic Surgery

## 2022-07-09 DIAGNOSIS — M1812 Unilateral primary osteoarthritis of first carpometacarpal joint, left hand: Secondary | ICD-10-CM

## 2022-07-09 MED ORDER — METHYLPREDNISOLONE ACETATE 40 MG/ML IJ SUSP
13.3300 mg | INTRAMUSCULAR | Status: AC | PRN
  Administered 2022-07-09: 13.33 mg

## 2022-07-09 MED ORDER — BUPIVACAINE HCL 0.5 % IJ SOLN
0.3300 mL | INTRAMUSCULAR | Status: AC | PRN
  Administered 2022-07-09: .33 mL

## 2022-07-09 MED ORDER — LIDOCAINE HCL 1 % IJ SOLN
0.3000 mL | INTRAMUSCULAR | Status: AC | PRN
  Administered 2022-07-09: .3 mL

## 2022-07-09 NOTE — Progress Notes (Signed)
Office Visit Note   Patient: Brenda Mcfarland           Date of Birth: [birth year]           MRN: [id number] Visit Date: 07/09/2022              Requested by: Lauree Chandler, NP [place],  [place] [id number] PCP: Lauree Chandler, NP   Assessment & Plan: Visit Diagnoses:  1. Primary osteoarthritis of first carpometacarpal joint of left hand     Plan: Impression is bilateral thumb CMC osteoarthritis.  She requested injections for both however after receiving the left one she declined the right one.  She tolerated the left injection well.  Follow-up as needed.  Follow-Up Instructions: No follow-ups on file.   Orders:  No orders of the defined types were placed in this encounter.  No orders of the defined types were placed in this encounter.     Procedures: Hand/UE Inj: L thumb CMC for osteoarthritis on 07/09/2022 11:03 AM Indications: pain Details: 25 G needle Medications: 0.3 mL lidocaine 1 %; 0.33 mL bupivacaine 0.5 %; 13.33 mg methylPREDNISolone acetate 40 MG/ML Outcome: tolerated well, no immediate complications      Clinical Data: No additional findings.   Subjective: Chief Complaint  Patient presents with   Left Thumb - Pain   Right Thumb - Pain    HPI Brenda Mcfarland returns today for bilateral thumb pain from [facility] arthritis.  Requesting injections for these.  She underwent a right trigger thumb injection in December of last year and has done well from this.  Her daughter is with her today.  Review of Systems  Constitutional: Negative.   HENT: Negative.    Eyes: Negative.   Respiratory: Negative.    Cardiovascular: Negative.   Endocrine: Negative.   Musculoskeletal: Negative.   Neurological: Negative.   Hematological: Negative.   Psychiatric/Behavioral: Negative.    All other systems reviewed and are negative.    Objective: Vital Signs: There were no vitals taken for this visit.  Physical Exam Vitals and nursing note reviewed.   Constitutional:      Appearance: She is well-developed.  Pulmonary:     Effort: Pulmonary effort is normal.  Skin:    General: Skin is warm.     Capillary Refill: Capillary refill takes less than 2 seconds.  Neurological:     Mental Status: She is alert and oriented to person, place, and time.  Psychiatric:        Behavior: Behavior normal.        Thought Content: Thought content normal.        Judgment: Judgment normal.     Ortho Exam Examination of bilateral thumbs show severe pain with thumb CMC grind test.  Normal sensation and capillary refill. Specialty Comments:    No specialty comments available.  Imaging: No results found.   PMFS History: Patient Active Problem List   Diagnosis Date Noted   Nocturia 05/15/2022   Paroxysmal SVT (supraventricular tachycardia) 01/22/2020   Arthritis 01/18/2020   Depression 01/18/2020   Loss of memory 01/18/2020   Spondylosis of cervical spine with myelopathy 01/18/2020   IFG (impaired fasting glucose) 10/24/2019   Sore neck 10/24/2019   Frequent urination 12/16/2018   Obstructive sleep apnea 12/16/2018   Type 2 diabetes mellitus without complication, without long-term current use of insulin ([place]) 12/16/2018   Incomplete emptying of bladder 11/30/2017   Insomnia 03/05/2017   Knee pain, left 12/22/2016   ESR  raised 11/27/2016   Osteoarthritis of both knees 11/27/2016   Obesity (BMI 30-39.9) 11/10/2016   Abnormal EKG 10/23/2016   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/23/2016   Morbid obesity ([place]) 10/23/2016   Palpitations 10/23/2016   Positive ANA (antinuclear antibody) 10/23/2016   Hypertension 10/08/2015   Chronic fatigue 10/08/2015   Diabetes mellitus ([place]) 10/08/2015   Glaucoma 10/08/2015   Major depressive disorder in partial remission ([place]) 10/08/2015   Vitamin D deficiency 10/08/2015   Sleep apnea 10/08/2015   History of pancreatitis 07/11/2014   Past Medical History:  Diagnosis Date   Abnormal EKG 10/23/2016   Arthritis  01/18/2020   Chronic fatigue 10/08/2015   Depression 01/18/2020   Diabetes mellitus ([place]) 10/08/2015   Formatting of this note might be different from the original. Overview:  last Hgb A1C 7.2/no blood sugars at home Formatting of this note might be different from the original. last Hgb A1C 7.2/no blood sugars at home   ESR raised 11/27/2016   Frequent urination 12/16/2018   Glaucoma 10/08/2015   High cholesterol    History of pancreatitis 07/11/2014   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/23/2016   Hypertension 10/08/2015   IFG (impaired fasting glucose) 10/24/2019   Incomplete emptying of bladder 11/30/2017   Knee pain, left 12/22/2016   Loss of memory 01/18/2020   Major depressive disorder in partial remission ([place]) 10/08/2015   Morbid obesity ([place]) 10/23/2016   Obesity (BMI 30-39.9) 11/10/2016   Obstructive sleep apnea 12/16/2018   Osteoarthritis of both knees 11/27/2016   Other insomnia 03/05/2017   Palpitations 10/23/2016   Positive ANA (antinuclear antibody) 10/23/2016   Sleep apnea 10/08/2015   Formatting of this note might be different from the original. Overview:  does not use CPAP Formatting of this note might be different from the original. does not use CPAP   Sore neck 10/24/2019   Spondylosis of cervical spine with myelopathy 01/18/2020   Type 2 diabetes mellitus without complication, without long-term current use of insulin ([place]) 12/16/2018   Vitamin D deficiency 10/08/2015    Family History  Problem Relation Age of Onset   Diabetes Mother    Multiple sclerosis Mother    Heart disease Father    Heart Problems Father    Migraines Daughter    Thyroid disease Daughter    Migraines Daughter     Past Surgical History:  Procedure Laterality Date   APPENDECTOMY     age 34   CESAREAN SECTION     X [place]  2015   SPINE SURGERY  05/29/2015   Social History   Occupational History   Not on file  Tobacco Use   Smoking status: Former    Packs/day: 1.00    Years: 10.00    Total pack years: 10.00     Types: Cigarettes   Smokeless tobacco: Never  Vaping Use   Vaping Use: Never used  Substance and Sexual Activity   Alcohol use: Not Currently   Drug use: Not Currently   Sexual activity: Not on file

## 2022-07-09 NOTE — Telephone Encounter (Signed)
Pt states she forgot brace is on her was to get it please have it up front.

## 2022-07-09 NOTE — Telephone Encounter (Signed)
If she has no questions/concerns and just wants to move forward with CPAP therapy, I am ok with placing orders (since she's been on it today) and then we can have her follow up in 8-10 weeks to pull a download and see how she is doing with therapy. Thanks.

## 2022-07-10 ENCOUNTER — Telehealth: Payer: Self-pay | Admitting: Orthopaedic Surgery

## 2022-07-10 NOTE — Telephone Encounter (Signed)
Called patient. She is needing a left CMC brace. She will come Monday and get this. I explained that it needs to be scheduled for a nurse visit because I need to fit her with the correct size and have her sign the tablet for it. She will call us back with a time for Monday.

## 2022-07-10 NOTE — Telephone Encounter (Signed)
Pt called requesting a call back from Lauren G to make sure a nurse visit at 4 pm. Please call pt to makes ure this is a good time. I went ahead and made appt

## 2022-07-13 ENCOUNTER — Ambulatory Visit: Payer: Medicare HMO | Admitting: Nurse Practitioner

## 2022-07-13 ENCOUNTER — Ambulatory Visit: Payer: Medicare HMO

## 2022-07-13 ENCOUNTER — Ambulatory Visit (INDEPENDENT_AMBULATORY_CARE_PROVIDER_SITE_OTHER): Payer: Medicare HMO

## 2022-07-13 DIAGNOSIS — M1811 Unilateral primary osteoarthritis of first carpometacarpal joint, right hand: Secondary | ICD-10-CM | POA: Diagnosis not present

## 2022-07-13 DIAGNOSIS — G5603 Carpal tunnel syndrome, bilateral upper limbs: Secondary | ICD-10-CM

## 2022-07-13 DIAGNOSIS — M1812 Unilateral primary osteoarthritis of first carpometacarpal joint, left hand: Secondary | ICD-10-CM | POA: Diagnosis not present

## 2022-07-13 NOTE — Progress Notes (Signed)
Patient came in on the nurse schedule today to be fitted with bilateral CMC braces.

## 2022-07-15 ENCOUNTER — Encounter: Payer: Self-pay | Admitting: Nurse Practitioner

## 2022-07-15 ENCOUNTER — Telehealth (INDEPENDENT_AMBULATORY_CARE_PROVIDER_SITE_OTHER): Payer: Medicare HMO | Admitting: Nurse Practitioner

## 2022-07-15 VITALS — BP 159/88 | Ht 67.0 in | Wt 235.0 lb

## 2022-07-15 DIAGNOSIS — G4733 Obstructive sleep apnea (adult) (pediatric): Secondary | ICD-10-CM

## 2022-07-15 DIAGNOSIS — G4700 Insomnia, unspecified: Secondary | ICD-10-CM | POA: Diagnosis not present

## 2022-07-15 NOTE — Assessment & Plan Note (Signed)
Chronic insomnia. Possibly related to untreated OSA. We will evaluate how she does after getting started on CPAP and discuss further treatment with pharmacological therapy, if indicated.

## 2022-07-15 NOTE — Progress Notes (Signed)
Patient ID: Brenda Mcfarland, female     DOB: 07-Jan-1946, 76 y.o.      MRN: [id number]  Chief Complaint  Patient presents with   Follow-up    Pt f/u to discuss CPAP therapy, she is having issues w/ vertigo. Only getting 2hrs of sleep/night. Interested in starting therapy again to help w/ symptoms.     Virtual Visit via Video Note  I connected with Brenda Mcfarland on 07/15/22 at  1:30 PM EST by a video enabled telemedicine application and verified that I am speaking with the correct person using two identifiers.  Location: Patient: Home Provider: Office   I discussed the limitations of evaluation and management by telemedicine and the availability of in person appointments. The patient expressed understanding and agreed to proceed.  History of Present Illness: 76 year old female, former smoker followed for severe obstructive sleep apnea.  Past medical history significant for hypertension, paroxysmal SVT, DM 2, osteoarthritis, chronic fatigue, insomnia, obesity, vertigo.   TESTS/EVENTS: 06/28/2022 HST: AHI 46/h, SpO2 low 77%  05/14/2022: OV with Sehaj Mcenroe NP for initial sleep consult.  History of OSA on CPAP.  Had significant trouble with tolerating mask and Pap therapy.  She ended up stopping.  Is now having trouble with nocturia and difficulty sleeping at night.  Only getting about 3-1/2 to 4 hours of sleep.  Previously tried on Ambien without much success.  No current sleep aids.  Would be open to restarting CPAP therapy.  Home sleep study ordered for further evaluation.  07/15/2022: Today-follow-up Patient presents via virtual visit for follow-up after undergoing home sleep study which revealed severe obstructive sleep apnea.  AHI was 46/h.  She is still struggling with daytime fatigue symptoms.  She is also recently had some issues with vertigo.  Is currently being managed by her PCP and working with physical therapy.  She has trouble with sleeping at night.  Does not sleep more than a few  hours each night.  Has trouble getting back to sleep when she is awake.  Denies any morning headaches, drowsy driving, witnessed apneas, sleep parasomnia/paralysis.  No history of narcolepsy or cataplexy.  She is agreeable to restarting on CPAP therapy.  Wants to make sure she has a mask that works better for her at this time.  Allergies  Allergen Reactions   Amlodipine Swelling   Aripiprazole Other (See Comments)    Tongue twisted, lips curled up Tongue twisted, lips curled up    Carvedilol Other (See Comments)    Nightmares Nightmares    Hydralazine     Diarrhea, abdominal pain   Irbesartan     dizziness   Losartan     dizzy   Meclizine Other (See Comments)    Dizziness    Paroxetine Hcl Other (See Comments)    . Marland Kitchen    Tizanidine Other (See Comments)    Syncope Syncope    Tramadol Other (See Comments)    Unknown   Zolpidem Other (See Comments)    Sleepwalking Sleepwalking    Zyrtec [Cetirizine Hcl]     Dizziness   Immunization History  Administered Date(s) Administered   Tdap 06/30/2018   Past Medical History:  Diagnosis Date   Abnormal EKG 10/23/2016   Arthritis 01/18/2020   Chronic fatigue 10/08/2015   Depression 01/18/2020   Diabetes mellitus ([place]) 10/08/2015   Formatting of this note might be different from the original. Overview:  last Hgb A1C 7.2/no blood sugars at home Formatting of this note might be different from the  original. last Hgb A1C 7.2/no blood sugars at home   ESR raised 11/27/2016   Frequent urination 12/16/2018   Glaucoma 10/08/2015   High cholesterol    History of pancreatitis 07/11/2014   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/23/2016   Hypertension 10/08/2015   IFG (impaired fasting glucose) 10/24/2019   Incomplete emptying of bladder 11/30/2017   Knee pain, left 12/22/2016   Loss of memory 01/18/2020   Major depressive disorder in partial remission ([place]) 10/08/2015   Morbid obesity ([place]) 10/23/2016   Obesity (BMI 30-39.9) 11/10/2016   Obstructive sleep apnea  12/16/2018   Osteoarthritis of both knees 11/27/2016   Other insomnia 03/05/2017   Palpitations 10/23/2016   Positive ANA (antinuclear antibody) 10/23/2016   Sleep apnea 10/08/2015   Formatting of this note might be different from the original. Overview:  does not use CPAP Formatting of this note might be different from the original. does not use CPAP   Sore neck 10/24/2019   Spondylosis of cervical spine with myelopathy 01/18/2020   Type 2 diabetes mellitus without complication, without long-term current use of insulin ([place]) 12/16/2018   Vitamin D deficiency 10/08/2015    Tobacco History: Social History   Tobacco Use  Smoking Status Former   Packs/day: 1.00   Years: 9.00   Total pack years: 9.00   Types: Cigarettes   Start date: 1964   Quit date: 1973   Years since quitting: 50.9  Smokeless Tobacco Never   Counseling given: Not Answered   Outpatient Medications Prior to Visit  Medication Sig Dispense Refill   amLODipine (NORVASC) 5 MG tablet Take 5 mg by mouth every other day.     Ibuprofen 200 MG CAPS Take by mouth as needed.     latanoprost (XALATAN) 0.005 % ophthalmic solution Place 1 drop into both eyes at bedtime.     losartan (COZAAR) 25 MG tablet Take 1 tablet (25 mg total) by mouth daily. 30 tablet 1   MAGNESIUM GLYCINATE PO Take 350 mg by mouth.     melatonin 5 MG TABS Take by mouth at bedtime. 1-2 tablets     OVER THE COUNTER MEDICATION daily. Essential Oils     polyethylene glycol (MIRALAX / GLYCOLAX) 17 g packet Take 17 g by mouth daily.     traZODone (DESYREL) 100 MG tablet Take 100 mg by mouth at bedtime. (Patient not taking: Reported on 07/15/2022)     No facility-administered medications prior to visit.     Review of Systems:   Constitutional: No  night sweats, fevers, chills, or lassitude. +weight gain, fatigue HEENT: No headaches, difficulty swallowing, tooth/dental problems, or sore throat. No sneezing, ear ache, nasal congestion, or post nasal drip. +  Occasional itching CV:  +swelling in lower extremities (occasional, reports it is related to her amlodipine).  No chest pain, orthopnea, PND, anasarca, palpitations, syncope Resp: No shortness of breath with exertion or at rest. No excess mucus or change in color of mucus. No productive or non-productive. No hemoptysis. No wheezing.  No chest wall deformity GI:  +heartburn, indigestion. No abdominal pain, nausea, vomiting, diarrhea, change in bowel habits, loss of appetite, bloody stools.  GU: +nocturia. No dysuria, change in color of urine, urgency or frequency.  No flank pain, no hematuria  Skin: No rash, lesions, ulcerations MSK:  No joint pain or swelling.  No decreased range of motion.  No back pain. Neuro: +dizziness. No gait abnormalities. No memory impairment.  Psych: No depression or anxiety. Mood stable.   Observations/Objective: Patient is  well-developed, well-nourished in no acute distress. Resting comfortably at home. A&Ox3. Unlabored breathing. Speech is clear and coherent with logical content.    Assessment and Plan: Obstructive sleep apnea Severe OSA with AHI 46/h. She is agreeable to restarting CPAP therapy. We will start her on auto setting 5-15 cmH2O and mask of choice. Cautioned on safe driving practices.   Patient Instructions  Start CPAP 5-15 cmH2O every night, minimum of 4-6 hours a night.  Change equipment every 30 days or as directed by DME. Wash your tubing with warm soap and water daily, hang to [place]. Wash humidifier portion weekly.  Be aware of reduced alertness and do not drive or operate heavy machinery if experiencing this or drowsiness.  Exercise encouraged, as tolerated. Avoid or decrease alcohol consumption and medications that make you more sleepy, if possible. Notify if persistent daytime sleepiness occurs even with consistent use of CPAP.  We discussed how untreated sleep apnea puts an individual at risk for cardiac arrhthymias, pulm HTN, DM, stroke and  increases their risk for daytime accidents. We also briefly reviewed treatment options including weight loss, side sleeping position, oral appliance, CPAP therapy or referral to ENT for possible surgical options  Follow up in 10-12 weeks with Katie Yacine Garriga,NP or sooner, if needed    Insomnia Chronic insomnia. Possibly related to untreated OSA. We will evaluate how she does after getting started on CPAP and discuss further treatment with pharmacological therapy, if indicated.      I discussed the assessment and treatment plan with the patient. The patient was provided an opportunity to ask questions and all were answered. The patient agreed with the plan and demonstrated an understanding of the instructions.   The patient was advised to call back or seek an in-person evaluation if the symptoms worsen or if the condition fails to improve as anticipated.  I provided 31 minutes of non-face-to-face time during this encounter.   Clayton Bibles, NP

## 2022-07-15 NOTE — Patient Instructions (Signed)
Start CPAP 5-15 cmH2O every night, minimum of 4-6 hours a night.  Change equipment every 30 days or as directed by DME. Wash your tubing with warm soap and water daily, hang to dry. Wash humidifier portion weekly.  Be aware of reduced alertness and do not drive or operate heavy machinery if experiencing this or drowsiness.  Exercise encouraged, as tolerated. Avoid or decrease alcohol consumption and medications that make you more sleepy, if possible. Notify if persistent daytime sleepiness occurs even with consistent use of CPAP.  We discussed how untreated sleep apnea puts an individual at risk for cardiac arrhthymias, pulm HTN, DM, stroke and increases their risk for daytime accidents. We also briefly reviewed treatment options including weight loss, side sleeping position, oral appliance, CPAP therapy or referral to ENT for possible surgical options  Follow up in 10-12 weeks with Brenda Takeesha Isley,NP or sooner, if needed

## 2022-07-15 NOTE — Assessment & Plan Note (Addendum)
Severe OSA with AHI 46/h. She is agreeable to restarting CPAP therapy. We will start her on auto setting 5-15 cmH2O and mask of choice. Cautioned on safe driving practices.   Patient Instructions  Start CPAP 5-15 cmH2O every night, minimum of 4-6 hours a night.  Change equipment every 30 days or as directed by DME. Wash your tubing with warm soap and water daily, hang to dry. Wash humidifier portion weekly.  Be aware of reduced alertness and do not drive or operate heavy machinery if experiencing this or drowsiness.  Exercise encouraged, as tolerated. Avoid or decrease alcohol consumption and medications that make you more sleepy, if possible. Notify if persistent daytime sleepiness occurs even with consistent use of CPAP.  We discussed how untreated sleep apnea puts an individual at risk for cardiac arrhthymias, pulm HTN, DM, stroke and increases their risk for daytime accidents. We also briefly reviewed treatment options including weight loss, side sleeping position, oral appliance, CPAP therapy or referral to ENT for possible surgical options  Follow up in 10-12 weeks with [name],NP or sooner, if needed

## 2022-07-16 NOTE — Progress Notes (Signed)
Reviewed and agree with assessment/plan.   Loranzo Desha, MD [facility] 07/16/2022, 8:35 AM Pager:  [phone]  

## 2022-07-20 ENCOUNTER — Other Ambulatory Visit: Payer: Self-pay | Admitting: *Deleted

## 2022-07-20 MED ORDER — LOSARTAN POTASSIUM 25 MG PO TABS: 25.0000 mg | ORAL_TABLET | Freq: Every day | ORAL | 1 refills | Status: DC

## 2022-07-20 NOTE — Telephone Encounter (Signed)
[facility] requested refill.  Pended Rx and sent to [facility] for approval due to HIGH ALERT Warning.

## 2022-07-27 ENCOUNTER — Other Ambulatory Visit: Payer: Self-pay

## 2022-07-27 ENCOUNTER — Encounter: Payer: Self-pay | Admitting: Nurse Practitioner

## 2022-07-27 DIAGNOSIS — G4733 Obstructive sleep apnea (adult) (pediatric): Secondary | ICD-10-CM

## 2022-07-27 MED ORDER — NYSTATIN 100000 UNIT/GM EX POWD: 1.0000 | Freq: Three times a day (TID) | CUTANEOUS | 0 refills | Status: DC

## 2022-07-27 NOTE — Telephone Encounter (Signed)
Spoke with patient.  She does not want to make an appointment because of her transportation issues.  She says she knows her body and just wants oral medication.  Advised patient she needs an appointment and she said she will call back.

## 2022-07-27 NOTE — Telephone Encounter (Signed)
Please call patient and schedule appt to be seen in office due to symptoms unable to prescribe medication without seeing patient first (can be scheduled with any provider in office)

## 2022-08-03 ENCOUNTER — Ambulatory Visit: Payer: Medicare HMO | Admitting: Nurse Practitioner

## 2022-08-07 DIAGNOSIS — R42 Dizziness and giddiness: Secondary | ICD-10-CM | POA: Diagnosis not present

## 2022-08-07 DIAGNOSIS — H9313 Tinnitus, bilateral: Secondary | ICD-10-CM | POA: Diagnosis not present

## 2022-08-17 ENCOUNTER — Encounter: Payer: Self-pay | Admitting: Nurse Practitioner

## 2022-08-27 DIAGNOSIS — M5136 Other intervertebral disc degeneration, lumbar region: Secondary | ICD-10-CM | POA: Diagnosis not present

## 2022-08-27 DIAGNOSIS — M9903 Segmental and somatic dysfunction of lumbar region: Secondary | ICD-10-CM | POA: Diagnosis not present

## 2022-08-27 DIAGNOSIS — M47816 Spondylosis without myelopathy or radiculopathy, lumbar region: Secondary | ICD-10-CM | POA: Diagnosis not present

## 2022-08-27 DIAGNOSIS — M5032 Other cervical disc degeneration, mid-cervical region, unspecified level: Secondary | ICD-10-CM | POA: Diagnosis not present

## 2022-08-27 DIAGNOSIS — M9902 Segmental and somatic dysfunction of thoracic region: Secondary | ICD-10-CM | POA: Diagnosis not present

## 2022-08-27 DIAGNOSIS — M9901 Segmental and somatic dysfunction of cervical region: Secondary | ICD-10-CM | POA: Diagnosis not present

## 2022-08-27 DIAGNOSIS — R519 Headache, unspecified: Secondary | ICD-10-CM | POA: Diagnosis not present

## 2022-08-28 ENCOUNTER — Encounter: Payer: Self-pay | Admitting: Nurse Practitioner

## 2022-08-28 DIAGNOSIS — R519 Headache, unspecified: Secondary | ICD-10-CM | POA: Diagnosis not present

## 2022-08-28 DIAGNOSIS — M47816 Spondylosis without myelopathy or radiculopathy, lumbar region: Secondary | ICD-10-CM | POA: Diagnosis not present

## 2022-08-28 DIAGNOSIS — M5136 Other intervertebral disc degeneration, lumbar region: Secondary | ICD-10-CM | POA: Diagnosis not present

## 2022-08-28 DIAGNOSIS — M9903 Segmental and somatic dysfunction of lumbar region: Secondary | ICD-10-CM | POA: Diagnosis not present

## 2022-08-28 DIAGNOSIS — M9901 Segmental and somatic dysfunction of cervical region: Secondary | ICD-10-CM | POA: Diagnosis not present

## 2022-08-28 DIAGNOSIS — M9902 Segmental and somatic dysfunction of thoracic region: Secondary | ICD-10-CM | POA: Diagnosis not present

## 2022-08-28 DIAGNOSIS — M5032 Other cervical disc degeneration, mid-cervical region, unspecified level: Secondary | ICD-10-CM | POA: Diagnosis not present

## 2022-09-01 ENCOUNTER — Encounter: Payer: Self-pay | Admitting: Nurse Practitioner

## 2022-09-01 DIAGNOSIS — M47816 Spondylosis without myelopathy or radiculopathy, lumbar region: Secondary | ICD-10-CM | POA: Diagnosis not present

## 2022-09-01 DIAGNOSIS — M9903 Segmental and somatic dysfunction of lumbar region: Secondary | ICD-10-CM | POA: Diagnosis not present

## 2022-09-01 DIAGNOSIS — M9901 Segmental and somatic dysfunction of cervical region: Secondary | ICD-10-CM | POA: Diagnosis not present

## 2022-09-01 DIAGNOSIS — R519 Headache, unspecified: Secondary | ICD-10-CM | POA: Diagnosis not present

## 2022-09-01 DIAGNOSIS — M9902 Segmental and somatic dysfunction of thoracic region: Secondary | ICD-10-CM | POA: Diagnosis not present

## 2022-09-01 DIAGNOSIS — M5136 Other intervertebral disc degeneration, lumbar region: Secondary | ICD-10-CM | POA: Diagnosis not present

## 2022-09-01 DIAGNOSIS — M5032 Other cervical disc degeneration, mid-cervical region, unspecified level: Secondary | ICD-10-CM | POA: Diagnosis not present

## 2022-09-02 ENCOUNTER — Ambulatory Visit (INDEPENDENT_AMBULATORY_CARE_PROVIDER_SITE_OTHER): Payer: Medicare HMO | Admitting: Nurse Practitioner

## 2022-09-02 ENCOUNTER — Encounter: Payer: Self-pay | Admitting: Nurse Practitioner

## 2022-09-02 VITALS — BP 128/84 | HR 82 | Temp 97.9°F | Ht 67.0 in | Wt 245.0 lb

## 2022-09-02 DIAGNOSIS — R42 Dizziness and giddiness: Secondary | ICD-10-CM | POA: Diagnosis not present

## 2022-09-02 DIAGNOSIS — M25511 Pain in right shoulder: Secondary | ICD-10-CM

## 2022-09-02 DIAGNOSIS — I10 Essential (primary) hypertension: Secondary | ICD-10-CM

## 2022-09-02 DIAGNOSIS — G4733 Obstructive sleep apnea (adult) (pediatric): Secondary | ICD-10-CM

## 2022-09-02 DIAGNOSIS — G4700 Insomnia, unspecified: Secondary | ICD-10-CM | POA: Diagnosis not present

## 2022-09-02 MED ORDER — AMLODIPINE BESYLATE 5 MG PO TABS: 5.0000 mg | ORAL_TABLET | ORAL | 3 refills | Status: DC

## 2022-09-02 MED ORDER — LOSARTAN POTASSIUM 50 MG PO TABS: 50.0000 mg | ORAL_TABLET | Freq: Every day | ORAL | 1 refills | Status: DC

## 2022-09-02 MED ORDER — PREDNISONE 10 MG (21) PO TBPK: ORAL_TABLET | ORAL | 0 refills | Status: DC

## 2022-09-02 NOTE — Telephone Encounter (Signed)
I called [facility] and spoke to [place] - order was received on 11/27.  She spoke to person that processes cpap orders and she states order is being processed now and pt should be getting a call from them today.  Will route back to triage so pt can be made aware thru [place].  [facility]'s phone # is [phone] is pt should need it.

## 2022-09-02 NOTE — Progress Notes (Signed)
Careteam: Patient Care Team: Lauree Chandler, NP as PCP - General (Geriatric Medicine) Berniece Salines, DO as PCP - Cardiology (Cardiology) Murlean Iba, MD as Referring Physician (Orthopedic Surgery) Park Liter, MD as Consulting Physician (Cardiology)  PLACE OF SERVICE:  [place]  Advanced Directive information    Allergies  Allergen Reactions   Amlodipine Swelling   Aripiprazole Other (See Comments)    Tongue twisted, lips curled up Tongue twisted, lips curled up    Carvedilol Other (See Comments)    Nightmares Nightmares    Hydralazine     Diarrhea, abdominal pain   Irbesartan     dizziness   Losartan     dizzy   Meclizine Other (See Comments)    Dizziness    Paroxetine Hcl Other (See Comments)    . Marland Kitchen    Tizanidine Other (See Comments)    Syncope Syncope    Tramadol Other (See Comments)    Unknown   Zolpidem Other (See Comments)    Sleepwalking Sleepwalking    Zyrtec [Cetirizine Hcl]     Dizziness    Chief Complaint  Patient presents with   Acute Visit    Patient c/o neck, arm, and shoulder pain since 08/07/22. Pain is so bad at times that patient yells out. Here with daughter. Discuss losartan and dizziness, losartan is listed on allergy list.      HPI: Patient is a 77 y.o. female for neck pain. She had to turn her head when she went to the ENT (12/13) and after that gradually started worsening in her neck started hurting. She went to the chiropractor for adjustments and felt like pain is a little better but has a lot of pain. Pain from her neck into her shoulder and back of her. Has limited ROM.  Has taken ibuprofen, some essential oils which has not helped.  Pain is a 7/10 but gets to 10 to 12.  She has gotten very weak with her vertigo.  Has been using ice since last week.   She is using losartan and amlodipine- she noticed dizziness when she started losartan, now does not notice this after taking medication.  Continues to have ongoing vertigo.   Review of Systems:  Review of Systems  Constitutional:  Negative for chills, fever and weight loss.  HENT:  Negative for tinnitus.   Respiratory:  Negative for cough, sputum production and shortness of breath.   Cardiovascular:  Negative for chest pain, palpitations and leg swelling.  Gastrointestinal:  Negative for abdominal pain, constipation, diarrhea and heartburn.  Genitourinary:  Negative for dysuria, frequency and urgency.  Musculoskeletal:  Positive for joint pain and myalgias. Negative for back pain and falls.  Skin: Negative.   Neurological:  Positive for dizziness. Negative for headaches.  Psychiatric/Behavioral:  Negative for depression and memory loss. The patient does not have insomnia.     Past Medical History:  Diagnosis Date   Abnormal EKG 10/23/2016   Arthritis 01/18/2020   Chronic fatigue 10/08/2015   Depression 01/18/2020   Diabetes mellitus ([place]) 10/08/2015   Formatting of this note might be different from the original. Overview:  last Hgb A1C 7.2/no blood sugars at home Formatting of this note might be different from the original. last Hgb A1C 7.2/no blood sugars at home   ESR raised 11/27/2016   Frequent urination 12/16/2018   Glaucoma 10/08/2015   High cholesterol    History of pancreatitis 07/11/2014   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/23/2016   Hypertension 10/08/2015  IFG (impaired fasting glucose) 10/24/2019   Incomplete emptying of bladder 11/30/2017   Knee pain, left 12/22/2016   Loss of memory 01/18/2020   Major depressive disorder in partial remission ([place]) 10/08/2015   Morbid obesity ([place]) 10/23/2016   Obesity (BMI 30-39.9) 11/10/2016   Obstructive sleep apnea 12/16/2018   Osteoarthritis of both knees 11/27/2016   Other insomnia 03/05/2017   Palpitations 10/23/2016   Positive ANA (antinuclear antibody) 10/23/2016   Sleep apnea 10/08/2015   Formatting of this note might be different from the original. Overview:  does not use CPAP Formatting of  this note might be different from the original. does not use CPAP   Sore neck 10/24/2019   Spondylosis of cervical spine with myelopathy 01/18/2020   Type 2 diabetes mellitus without complication, without long-term current use of insulin ([place]) 12/16/2018   Vitamin D deficiency 10/08/2015   Past Surgical History:  Procedure Laterality Date   APPENDECTOMY     age 58   CESAREAN SECTION     X [place]  2015   SPINE SURGERY  05/29/2015   Social History:   reports that she quit smoking about 51 years ago. Her smoking use included cigarettes. She started smoking about 60 years ago. She has a 9.00 pack-year smoking history. She has never used smokeless tobacco. She reports that she does not currently use alcohol. She reports that she does not currently use drugs.  Family History  Problem Relation Age of Onset   Diabetes Mother    Multiple sclerosis Mother    Heart disease Father    Heart Problems Father    Migraines Daughter    Thyroid disease Daughter    Migraines Daughter     Medications: Patient's Medications  New Prescriptions   No medications on file  Previous Medications   AMLODIPINE (NORVASC) 5 MG TABLET    Take 5 mg by mouth every other day.   IBUPROFEN 200 MG CAPS    Take by mouth as needed.   LATANOPROST (XALATAN) 0.005 % OPHTHALMIC SOLUTION    Place 1 drop into both eyes at bedtime.   LOSARTAN (COZAAR) 50 MG TABLET    Take 50 mg by mouth daily.   MAGNESIUM GLYCINATE PO    Take 350 mg by mouth.   MELATONIN 5 MG TABS    Take by mouth at bedtime. 1-2 tablets   OVER THE COUNTER MEDICATION    daily. Essential Oils   POLYETHYLENE GLYCOL (MIRALAX / GLYCOLAX) 17 G PACKET    Take 17 g by mouth daily.  Modified Medications   No medications on file  Discontinued Medications   LOSARTAN (COZAAR) 25 MG TABLET    Take 1 tablet (25 mg total) by mouth daily.   NYSTATIN (MYCOSTATIN/NYSTOP) POWDER    Apply 1 Application topically 3 (three) times daily.    Physical Exam:  Vitals:    09/01/22 1555  BP: 128/84  Pulse: 82  Temp: 97.9 F (36.6 C)  TempSrc: Temporal  SpO2: 96%  Weight: 245 lb (111.1 kg)  Height: _0  (1.702 m)   Body mass index is 38.37 kg/m. Wt Readings from Last 3 Encounters:  09/01/22 245 lb (111.1 kg)  07/15/22 235 lb (106.6 kg)  05/14/22 243 lb 12.8 oz (110.6 kg)    Physical Exam Constitutional:      General: She is not in acute distress.    Appearance: She is well-developed. She is not diaphoretic.  HENT:     Head: Normocephalic  and atraumatic.     Mouth/Throat:     Pharynx: No oropharyngeal exudate.  Eyes:     Conjunctiva/sclera: Conjunctivae normal.     Pupils: Pupils are equal, round, and reactive to light.  Cardiovascular:     Rate and Rhythm: Normal rate and regular rhythm.     Heart sounds: Normal heart sounds.  Pulmonary:     Effort: Pulmonary effort is normal.     Breath sounds: Normal breath sounds.  Abdominal:     General: Bowel sounds are normal.     Palpations: Abdomen is soft.  Musculoskeletal:     Right shoulder: Tenderness present. Decreased range of motion. Decreased strength.     Left shoulder: Normal.       Arms:     Cervical back: Normal range of motion and neck supple.     Right lower leg: No edema.     Left lower leg: No edema.     Comments: Significant tenderness with palpitation  and pain with movement  Skin:    General: Skin is warm and dry.  Neurological:     Mental Status: She is alert.  Psychiatric:        Mood and Affect: Mood normal.     Labs reviewed: Basic Metabolic Panel: Recent Labs    11/17/21 1509 05/01/22 1542  NA 142 140  K 4.1 4.1  CL 107 106  CO2 25 23  GLUCOSE 126 121  BUN 22 23  CREATININE 0.94 0.85  CALCIUM 9.2 9.1  TSH  --  2.82   Liver Function Tests: Recent Labs    11/17/21 1509 05/01/22 1542  AST 14 15  ALT 15 21  BILITOT 0.3 0.4  PROT 6.7 6.9   No results for input(s): "LIPASE", "AMYLASE" in the last 8760 hours. No results for input(s):  "AMMONIA" in the last 8760 hours. CBC: Recent Labs    11/17/21 1509 05/01/22 1542  WBC 6.3 6.5  NEUTROABS 3,226 3,718  HGB 13.5 14.2  HCT 39.8 42.0  MCV 94.5 94.0  PLT 247 237   Lipid Panel: Recent Labs    11/17/21 1509  CHOL 204*  HDL 54  LDLCALC 118*  TRIG 199*  CHOLHDL 3.8   TSH: Recent Labs    05/01/22 1542  TSH 2.82   A1C: Lab Results  Component Value Date   HGBA1C 5.9 (H) 05/01/2022     Assessment/Plan 1. Vertigo Ongoing, plans to follow up with neurology for further evaluation  2. Primary hypertension -Blood pressure well controlled, goal bp <140/90 Continue current medications and dietary modifications follow metabolic panel - amLODipine (NORVASC) 5 MG tablet; Take 1 tablet (5 mg total) by mouth every other day.  Dispense: 90 tablet; Refill: 3 - losartan (COZAAR) 50 MG tablet; Take 1 tablet (50 mg total) by mouth daily.  Dispense: 90 tablet; Refill: 1  3. Acute pain of right shoulder -severe pain noted, - Ambulatory referral to Sports Medicine for further evaluation and treatment. - predniSONE (STERAPRED UNI-PAK 21 TAB) 10 MG (21) TBPK tablet; Use as directed  Dispense: 21 tablet; Refill: 0  at this time Continue ice  4. Insomnia, unspecified type -ongoing, continues on melatonin   5. OSA (obstructive sleep apnea) Awaiting CPAP.     Return in about 4 months (around 01/01/2023) for routine follow up. Carlos American. [place], [facility] [phone]

## 2022-09-02 NOTE — Telephone Encounter (Signed)
Mychart message sent by pt: Brenda Mcfarland [facility] (supporting Marland Kitchen V, NP)21 hours ago (12:36 PM)   CP I still have not heard from [facility] concerning Cpap. It's been over a month since this whole process has started in November 13.  I've contacted you and Earnest Bailey a number of times, and still no results from [employer].     Routing to [facility] to see if they can help [place] out with this.

## 2022-09-02 NOTE — Patient Instructions (Addendum)
Stop ibuprofen  Okay to use tylenol 325 mg 2 tablets every 6 hours as needed for pain   Follow up with neurologist for dizziness   Continue to ice shoulder

## 2022-09-04 ENCOUNTER — Encounter: Payer: Self-pay | Admitting: Nurse Practitioner

## 2022-09-04 DIAGNOSIS — M25511 Pain in right shoulder: Secondary | ICD-10-CM

## 2022-09-07 ENCOUNTER — Telehealth: Payer: Medicare HMO

## 2022-09-07 MED ORDER — HYDROCODONE-ACETAMINOPHEN 5-325 MG PO TABS: 1.0000 | ORAL_TABLET | Freq: Four times a day (QID) | ORAL | 0 refills | Status: DC | PRN

## 2022-09-07 NOTE — Telephone Encounter (Signed)
Patient is requesting hydrocodone be sent to pharmacy  Messae sent to Sherrie Mustache, NP

## 2022-09-07 NOTE — Telephone Encounter (Signed)
It was sent to pharmacy.

## 2022-09-11 ENCOUNTER — Ambulatory Visit (INDEPENDENT_AMBULATORY_CARE_PROVIDER_SITE_OTHER): Payer: Medicare HMO | Admitting: Family Medicine

## 2022-09-11 ENCOUNTER — Ambulatory Visit: Payer: Self-pay

## 2022-09-11 VITALS — BP 140/86 | HR 91 | Ht 67.0 in | Wt 243.0 lb

## 2022-09-11 DIAGNOSIS — M4712 Other spondylosis with myelopathy, cervical region: Secondary | ICD-10-CM | POA: Diagnosis not present

## 2022-09-11 DIAGNOSIS — M25511 Pain in right shoulder: Secondary | ICD-10-CM

## 2022-09-11 DIAGNOSIS — G8929 Other chronic pain: Secondary | ICD-10-CM

## 2022-09-11 DIAGNOSIS — M62838 Other muscle spasm: Secondary | ICD-10-CM

## 2022-09-11 MED ORDER — METHOCARBAMOL 500 MG PO TABS: 500.0000 mg | ORAL_TABLET | Freq: Three times a day (TID) | ORAL | 0 refills | Status: DC | PRN

## 2022-09-11 NOTE — Patient Instructions (Addendum)
Thank you for coming in today.   I've referred you to Physical Therapy.  Let us know if you don't hear from them in one week.   Call or go to the ER if you develop a large red swollen joint with extreme pain or oozing puss.    Recheck in 6 weeks.   Try a heating pad and TENS unit.   Use the muscle relaxer sparingly.   TENS UNIT: This is helpful for muscle pain and spasm.   Search and Purchase a TENS 7000 2nd edition at  www.tenspros.com or www.[place].com It should be less than $30.     TENS unit instructions: Do not shower or bathe with the unit on Turn the unit off before removing electrodes or batteries If the electrodes lose stickiness add a drop of water to the electrodes after they are disconnected from the unit and place on plastic sheet. If you continued to have difficulty, call the TENS unit company to purchase more electrodes. Do not apply lotion on the skin area prior to use. Make sure the skin is clean and dry as this will help prolong the life of the electrodes. After use, always check skin for unusual red areas, rash or other skin difficulties. If there are any skin problems, does not apply electrodes to the same area. Never remove the electrodes from the unit by pulling the wires. Do not use the TENS unit or electrodes other than as directed. Do not change electrode placement without consultating your therapist or physician. Keep 2 fingers with between each electrode. Wear time ratio is 2:1, on to off times.    For example on for 30 minutes off for 15 minutes and then on for 30 minutes off for 15 minutes

## 2022-09-11 NOTE — Progress Notes (Signed)
I, Peterson Lombard, LAT, ATC acting as a scribe for Lynne Leader, MD.  Subjective:    CC: R shoulder and neck pain  HPI: Pt is a 77 y/o female c/o R shoulder and neck pain ongoing since mid-Dec. Pt hx of a cervical fusion 7 years ago. Pt locates pain to R-side of her neck, R shoulder, and along the lateral aspect of the upper arm. Sometime pain will radiate further in to R forearm and R hand.  Dominant symptoms are right lateral shoulder pain and trapezius and lateral neck pain. The dominant pain is worse with overhead motion reaching back and at bedtime.  Radiates:  yes Mechanical symptoms: yes UE Numbness/tingling: no UE Weakness: no Aggravates: any shoulder motions; aBd, overhead Treatments tried: hydrocodone, prednisone  Pertinent review of Systems: No fevers or chills.  Positive for vertigo.  Relevant historical information: Hypertension.  Diabetes.  History of vertigo.  History of cervical radiculopathy requiring cervical spine surgery.   Objective:    Vitals:   09/11/22 0922  BP: (!) 140/86  Pulse: 91  SpO2: 95%   General: Well Developed, well nourished, and in no acute distress.   MSK: C-spine: Normal appearing Nontender spinal midline.  Tender palpation right lateral cervical paraspinal musculature and trapezius. Decreased cervical motion. Upper extremity strength is intact except noted below. Sensation is intact.  Right shoulder: Swelling located lateral shoulder otherwise normal-appearing Tender palpation trapezius  Decreased shoulder motion.  Abduction limited to 110 degrees.  External rotation full internal rotation lumbar spine. Strength 4/5 abduction.  4/5 external rotation.  5/5 internal rotation. Positive Hawkins and Neer's test.   Positive empty can test. Negative Yergason's and speeds test.  Pulses capillary refill and sensation are intact distally.  Lab and Radiology Results  Procedure: Real-time Ultrasound Guided Injection of right  shoulder subacromial bursa Device: Philips Affiniti 50G Images permanently stored and available for review in PACS Very large subacromial and subdeltoid bursitis are present.  Rotator cuff tendons appear to be intact without large rotator cuff retracted tear.  Accuracy of ultrasound limited by body habitus Verbal informed consent obtained.  Discussed risks and benefits of procedure. Warned about infection, bleeding, hyperglycemia damage to structures among others. Patient expresses understanding and agreement Time-out conducted.   Noted no overlying erythema, induration, or other signs of local infection.   Skin prepped in a sterile fashion.   Local anesthesia: Topical Ethyl chloride.   With sterile technique and under real time ultrasound guidance: 40 mg of Kenalog and 2 mL of Marcaine injected into subacromial bursa. Fluid seen entering the bursa.   Completed without difficulty   Pain immediately resolved suggesting accurate placement of the medication.   Advised to call if fevers/chills, erythema, induration, drainage, or persistent bleeding.   Images permanently stored and available for review in the ultrasound unit.  Impression: Technically successful ultrasound guided injection.   Patient had x-rays C-spine and right shoulder at the chiropractor office earlier this week.  She will bring the by the office later.     Impression and Recommendations:    Assessment and Plan: 77 y.o. female with right shoulder and neck pain.  Pain is multifactorial.  The dominant pain is rotator cuff tendinitis/bursitis and impingement.  She had immediate good response with a subacromial injection indicating this is the main pain generator.  Plan to refer to physical therapy as that will be helpful as well.  Additionally she has significant dysfunction and spasm and pain related to trapezius and right lateral cervical  paraspinal muscle dysfunction.  Plan to refer to physical therapy.  Also recommend  heating pad and TENS unit.  I prescribed methocarbamol at low-dose as her daughter has tolerated that well in the past.  There may be a component of cervical radiculopathy but this is much less dominant and certainly we can follow this up in the future if it becomes more dominant..  Recheck in 6 weeks.  PDMP not reviewed this encounter. Orders Placed This Encounter  Procedures   [place] LIMITED JOINT SPACE STRUCTURES UP RIGHT(NO LINKED CHARGES)    Order Specific Question:   Reason for Exam (SYMPTOM  OR DIAGNOSIS REQUIRED)    Answer:   right shoulder pain    Order Specific Question:   Preferred imaging location?    Answer:   [facility]   Ambulatory referral to Physical Therapy    Referral Priority:   Routine    Referral Type:   Physical Medicine    Referral Reason:   Specialty Services Required    Requested Specialty:   Physical Therapy    Number of Visits Requested:   1   Meds ordered this encounter  Medications   methocarbamol (ROBAXIN) 500 MG tablet    Sig: Take 1 tablet (500 mg total) by mouth every 8 (eight) hours as needed for muscle spasms.    Dispense:  90 tablet    Refill:  0    Discussed warning signs or symptoms. Please see discharge instructions. Patient expresses understanding.   The above documentation has been reviewed and is accurate and complete Lynne Leader, M.D.

## 2022-09-14 DIAGNOSIS — G4733 Obstructive sleep apnea (adult) (pediatric): Secondary | ICD-10-CM | POA: Diagnosis not present

## 2022-09-30 ENCOUNTER — Ambulatory Visit: Payer: Medicare HMO | Attending: Family Medicine | Admitting: Physical Therapy

## 2022-09-30 ENCOUNTER — Encounter: Payer: Self-pay | Admitting: Physical Therapy

## 2022-09-30 DIAGNOSIS — R293 Abnormal posture: Secondary | ICD-10-CM | POA: Diagnosis present

## 2022-09-30 DIAGNOSIS — G8929 Other chronic pain: Secondary | ICD-10-CM | POA: Diagnosis not present

## 2022-09-30 DIAGNOSIS — R252 Cramp and spasm: Secondary | ICD-10-CM | POA: Diagnosis present

## 2022-09-30 DIAGNOSIS — M6281 Muscle weakness (generalized): Secondary | ICD-10-CM | POA: Diagnosis present

## 2022-09-30 DIAGNOSIS — M25511 Pain in right shoulder: Secondary | ICD-10-CM | POA: Diagnosis present

## 2022-09-30 DIAGNOSIS — M62838 Other muscle spasm: Secondary | ICD-10-CM | POA: Insufficient documentation

## 2022-09-30 NOTE — Therapy (Signed)
OUTPATIENT PHYSICAL THERAPY SHOULDER EVALUATION   Patient Name: Brenda Mcfarland MRN: [id number] DOB:1946/02/13, 77 y.o., female Today's Date: 09/30/2022  END OF SESSION:  PT End of Session - 09/30/22 1021     Visit Number 1    Number of Visits 12    Date for PT Re-Evaluation 11/11/22    Authorization Type [employer] MCR + State BCBS    Progress Note Due on Visit 10    PT Start Time 1021    PT Stop Time 1105    PT Time Calculation (min) 44 min    Activity Tolerance Patient tolerated treatment well    Behavior During Therapy WFL for tasks assessed/performed             Past Medical History:  Diagnosis Date   Abnormal EKG 10/23/2016   Arthritis 01/18/2020   Chronic fatigue 10/08/2015   Depression 01/18/2020   Diabetes mellitus ([place]) 10/08/2015   Formatting of this note might be different from the original. Overview:  last Hgb A1C 7.2/no blood sugars at home Formatting of this note might be different from the original. last Hgb A1C 7.2/no blood sugars at home   ESR raised 11/27/2016   Frequent urination 12/16/2018   Glaucoma 10/08/2015   High cholesterol    History of pancreatitis 07/11/2014   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/23/2016   Hypertension 10/08/2015   IFG (impaired fasting glucose) 10/24/2019   Incomplete emptying of bladder 11/30/2017   Knee pain, left 12/22/2016   Loss of memory 01/18/2020   Major depressive disorder in partial remission ([place]) 10/08/2015   Morbid obesity ([place]) 10/23/2016   Obesity (BMI 30-39.9) 11/10/2016   Obstructive sleep apnea 12/16/2018   Osteoarthritis of both knees 11/27/2016   Other insomnia 03/05/2017   Palpitations 10/23/2016   Positive ANA (antinuclear antibody) 10/23/2016   Sleep apnea 10/08/2015   Formatting of this note might be different from the original. Overview:  does not use CPAP Formatting of this note might be different from the original. does not use CPAP   Sore neck 10/24/2019   Spondylosis of cervical spine with myelopathy 01/18/2020   Type 2 diabetes mellitus  without complication, without long-term current use of insulin ([place]) 12/16/2018   Vitamin D deficiency 10/08/2015   Past Surgical History:  Procedure Laterality Date   APPENDECTOMY     age 83   CESAREAN SECTION     X [place]  2015   SPINE SURGERY  05/29/2015   Patient Active Problem List   Diagnosis Date Noted   Nocturia 05/15/2022   Paroxysmal SVT (supraventricular tachycardia) 01/22/2020   Arthritis 01/18/2020   Depression 01/18/2020   Loss of memory 01/18/2020   Spondylosis of cervical spine with myelopathy 01/18/2020   IFG (impaired fasting glucose) 10/24/2019   Frequent urination 12/16/2018   Obstructive sleep apnea 12/16/2018   Type 2 diabetes mellitus without complication, without long-term current use of insulin ([place]) 12/16/2018   Incomplete emptying of bladder 11/30/2017   Insomnia 03/05/2017   Knee pain, left 12/22/2016   ESR raised 11/27/2016   Osteoarthritis of both knees 11/27/2016   Obesity (BMI 30-39.9) 11/10/2016   Abnormal EKG 10/23/2016   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/23/2016   Morbid obesity ([place]) 10/23/2016   Palpitations 10/23/2016   Positive ANA (antinuclear antibody) 10/23/2016   Hypertension 10/08/2015   Chronic fatigue 10/08/2015   Diabetes mellitus ([place]) 10/08/2015   Glaucoma 10/08/2015   Major depressive disorder in partial remission ([place]) 10/08/2015   Vitamin D deficiency 10/08/2015  Sleep apnea 10/08/2015   History of pancreatitis 07/11/2014    PCP: Lauree Chandler, NP  REFERRING PROVIDER: Gregor Hams, MD   REFERRING DIAG: [id number] (ICD-10-CM) - Chronic right shoulder pain M62.838 (ICD-10-CM) - Trapezius muscle spasm  THERAPY DIAG:  Acute pain of right shoulder  Abnormal posture  Cramp and spasm  Muscle weakness (generalized)  Rationale for Evaluation and Treatment: Rehabilitation  ONSET DATE: 08/13/23  SUBJECTIVE:                                                                                                                                                                                       SUBJECTIVE STATEMENT: Brenda Mcfarland reports she still has the vertigo and things have gone down hill, so become sedentary, gone downhill, had audiology appointment and then had her lay back and turn head, and that just tore something in her arm, went to orthopedic man who gave her cortisone shot in R shoulder.  She is right hand dominant, and having trouble doing things with left, so showering, dressing.  Went to chiropractor she hadn't seen before and she pulled arms overhead and that made it worse.   PERTINENT HISTORY: history neck pain/surgery, tinnitis, chronic LBP, recent COVID infection with brain fog, glaucoma, obesity, bil knee OA, T2DM, vertigo  PAIN:  Are you having pain? Yes: NPRS scale: 2-6/10 Pain location: R shoulder  Pain description: radiates down to elbow, shooting, throbbing Aggravating factors: moving arm, raising overhead, putting on bra Relieving factors: keeping it still, pain creme, heat   PRECAUTIONS: None  WEIGHT BEARING RESTRICTIONS: No  FALLS:  Has patient fallen in last 6 months? Yes. Number of falls 1 going up stairs, tripped  LIVING ENVIRONMENT: Lives with: lives alone Lives in: House/apartment Stairs: Yes: Internal: 2 steps; on right going up Has following equipment at home: None  OCCUPATION: Retired   PLOF: Independent  PATIENT GOALS:decrease shoulder pain  NEXT MD VISIT:  10/22/2022  OBJECTIVE:   DIAGNOSTIC FINDINGS:  09/11/2022 Limited diagnostic ultrasound: Very large subacromial and subdeltoid bursitis are present. Rotator cuff tendons appear to be intact without large rotator cuff retracted tear. Accuracy of ultrasound limited by body habitus   PATIENT SURVEYS:  Quick Dash 47.7%  COGNITION: Overall cognitive status: Within functional limits for tasks assessed     SENSATION: WFL reports occasional numbness radiating to palmar surface R hand    POSTURE: Forward head, rounded shoulders  CERVICAL ROM:  AROM eval  Cervical Flexion 15  Cervical Extension 20  Cervical Rotation to Left 30  Cervical Rotation to Right 45  Left Sidebend 15  Right Sidebend 10    UPPER EXTREMITY ROM:  Active ROM Right eval Left eval  Shoulder flexion 85 125  Shoulder extension 55 65  Shoulder abduction 85 115  Shoulder adduction    Shoulder internal rotation 60 70  Shoulder external rotation 50 65  (Blank rows = not tested)  UPPER EXTREMITY MMT:  MMT Right eval Left eval  Shoulder flexion 4+ 5  Shoulder extension    Shoulder abduction 4+ 5  Shoulder adduction    Shoulder internal rotation 4+ 5  Shoulder external rotation 4+ 5  Elbow flexion 5 5  Elbow extension 5 5  Wrist flexion 5 5  Wrist extension 5 5  Grip strength (lbs) good good  (Blank rows = not tested)  SHOULDER SPECIAL TESTS: NT  JOINT MOBILITY TESTING:  NT  PALPATION:  Tightness R upper trap, R levator scapulae, cervical paraspinals, R deltoid and bicips   TODAY'S TREATMENT:                                                                                                                                         DATE:   Self Care: See patient education, initial exercises (shoulder rolls backwards/shrugs) Ultrasound: x 5 min to R biceps 1 MHz, 1.2 w/cm2 cont to decrease inflammation/pain    PATIENT EDUCATION: Education details: findings, POC, education on dry needling, importance of posture, initial exercise.  Person educated: Patient Education method: Medical illustrator Education comprehension: verbalized understanding  HOME EXERCISE PROGRAM: TBA  ASSESSMENT:  CLINICAL IMPRESSION: Patient is a 77 y.o. right hand dominant female who was seen today for physical therapy evaluation and treatment for R upper trap strain and shoulder pain.  She reports symptoms were aggravated when neck forcefully turned during vertigo assessment, but shoulder  does feel better after injection.  Examination results consistent with referral.  She was very interested in dry needling, but after education and discussion of soreness following, decided to defer today as working on quilt, so performed US on tender area in R biceps as this was the most painful area today.  She has a number of other medical issues, and overall has demonstrated poor tolerance to exercise in past, very worried about backwards shoulders rolls today causing pain.  Brenda Mcfarland would benefit from skilled physical therapy to decrease muscle spasm, decrease R shoulder pain and improve QOL.    OBJECTIVE IMPAIRMENTS: decreased activity tolerance, decreased endurance, decreased mobility, decreased ROM, decreased strength, increased fascial restrictions, increased muscle spasms, impaired flexibility, impaired UE functional use, postural dysfunction, and pain.   ACTIVITY LIMITATIONS: carrying, lifting, sleeping, bed mobility, dressing, reach over head, and hygiene/grooming  PARTICIPATION LIMITATIONS:  sewing  PERSONAL FACTORS: Age, Fitness, Past/current experiences, and 3+ comorbidities: history neck pain/surgery, tinnitis, chronic LBP, recent COVID infection with brain fog, glaucoma, obesity, bil knee OA, T2DM, vertigo  are also affecting patient's functional outcome.   REHAB POTENTIAL: Good  CLINICAL DECISION MAKING: Evolving/moderate complexity  EVALUATION COMPLEXITY: Moderate  GOALS: Goals reviewed with patient? Yes  SHORT TERM GOALS: Target date: 10/14/2022   Patient will be independent with initial HEP.  Baseline: TBA Goal status: INITIAL   LONG TERM GOALS: Target date: 11/11/2022   Patient will be independent with advanced/ongoing HEP to improve outcomes and carryover.  Baseline:  Goal status: INITIAL  2.  Patient will report 75% improvement in Right shoulder pain to improve QOL.  Baseline:  Goal status: INITIAL  3. Patient to improve R shoulder AROM to [facility] without  pain provocation to allow for increased ease of ADLs.  Baseline: see objective  Goal status: INITIAL  4.  Patient will demonstrate improved functional UE strength as demonstrated by 5/5 R shoulder strength. Baseline: 4+/5 limited by pain Goal status: INITIAL  5.  Patient will report <40% impairment on QuickDash to demonstrate improved functional ability.  Baseline: 47.7% Goal status: INITIAL  PLAN:  PT FREQUENCY: 2x/week  PT DURATION: 6 weeks  PLANNED INTERVENTIONS: Therapeutic exercises, Therapeutic activity, Neuromuscular re-education, Balance training, Gait training, Patient/Family education, Self Care, Joint mobilization, Dry Needling, Electrical stimulation, Spinal mobilization, Cryotherapy, Moist heat, Taping, Traction, Ultrasound, Ionotophoresis 4mg /ml Dexamethasone, Manual therapy, and Re-evaluation  PLAN FOR NEXT SESSION: HEP for postural strengthening, TrDN to R UT/LS, manual therapy.    Rennie Natter, PT, DPT  09/30/2022, 6:22 PM

## 2022-10-06 ENCOUNTER — Encounter: Payer: Self-pay | Admitting: Physical Therapy

## 2022-10-06 ENCOUNTER — Ambulatory Visit: Payer: Medicare HMO | Attending: Family Medicine | Admitting: Physical Therapy

## 2022-10-06 DIAGNOSIS — R252 Cramp and spasm: Secondary | ICD-10-CM | POA: Diagnosis not present

## 2022-10-06 DIAGNOSIS — R293 Abnormal posture: Secondary | ICD-10-CM | POA: Insufficient documentation

## 2022-10-06 DIAGNOSIS — M25511 Pain in right shoulder: Secondary | ICD-10-CM | POA: Diagnosis not present

## 2022-10-06 DIAGNOSIS — M6281 Muscle weakness (generalized): Secondary | ICD-10-CM | POA: Diagnosis not present

## 2022-10-06 NOTE — Therapy (Signed)
OUTPATIENT PHYSICAL THERAPY Treatment   Patient Name: Brenda Mcfarland MRN: [id number] DOB:08/17/1946, 77 y.o., female Today's Date: 10/06/2022  END OF SESSION:  PT End of Session - 10/06/22 1022     Visit Number 2    Number of Visits 12    Date for PT Re-Evaluation 11/11/22    Authorization Type [employer] MCR + State BCBS    Progress Note Due on Visit 10    PT Start Time 1018    PT Stop Time 1100    PT Time Calculation (min) 42 min    Activity Tolerance Patient tolerated treatment well    Behavior During Therapy WFL for tasks assessed/performed             Past Medical History:  Diagnosis Date   Abnormal EKG 10/23/2016   Arthritis 01/18/2020   Chronic fatigue 10/08/2015   Depression 01/18/2020   Diabetes mellitus ([place]) 10/08/2015   Formatting of this note might be different from the original. Overview:  last Hgb A1C 7.2/no blood sugars at home Formatting of this note might be different from the original. last Hgb A1C 7.2/no blood sugars at home   ESR raised 11/27/2016   Frequent urination 12/16/2018   Glaucoma 10/08/2015   High cholesterol    History of pancreatitis 07/11/2014   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/23/2016   Hypertension 10/08/2015   IFG (impaired fasting glucose) 10/24/2019   Incomplete emptying of bladder 11/30/2017   Knee pain, left 12/22/2016   Loss of memory 01/18/2020   Major depressive disorder in partial remission ([place]) 10/08/2015   Morbid obesity ([place]) 10/23/2016   Obesity (BMI 30-39.9) 11/10/2016   Obstructive sleep apnea 12/16/2018   Osteoarthritis of both knees 11/27/2016   Other insomnia 03/05/2017   Palpitations 10/23/2016   Positive ANA (antinuclear antibody) 10/23/2016   Sleep apnea 10/08/2015   Formatting of this note might be different from the original. Overview:  does not use CPAP Formatting of this note might be different from the original. does not use CPAP   Sore neck 10/24/2019   Spondylosis of cervical spine with myelopathy 01/18/2020   Type 2 diabetes mellitus without  complication, without long-term current use of insulin ([place]) 12/16/2018   Vitamin D deficiency 10/08/2015   Past Surgical History:  Procedure Laterality Date   APPENDECTOMY     age 67   CESAREAN SECTION     X [place]  2015   SPINE SURGERY  05/29/2015   Patient Active Problem List   Diagnosis Date Noted   Nocturia 05/15/2022   Paroxysmal SVT (supraventricular tachycardia) 01/22/2020   Arthritis 01/18/2020   Depression 01/18/2020   Loss of memory 01/18/2020   Spondylosis of cervical spine with myelopathy 01/18/2020   IFG (impaired fasting glucose) 10/24/2019   Frequent urination 12/16/2018   Obstructive sleep apnea 12/16/2018   Type 2 diabetes mellitus without complication, without long-term current use of insulin ([place]) 12/16/2018   Incomplete emptying of bladder 11/30/2017   Insomnia 03/05/2017   Knee pain, left 12/22/2016   ESR raised 11/27/2016   Osteoarthritis of both knees 11/27/2016   Obesity (BMI 30-39.9) 11/10/2016   Abnormal EKG 10/23/2016   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/23/2016   Morbid obesity ([place]) 10/23/2016   Palpitations 10/23/2016   Positive ANA (antinuclear antibody) 10/23/2016   Hypertension 10/08/2015   Chronic fatigue 10/08/2015   Diabetes mellitus ([place]) 10/08/2015   Glaucoma 10/08/2015   Major depressive disorder in partial remission ([place]) 10/08/2015   Vitamin D deficiency 10/08/2015   Sleep  apnea 10/08/2015   History of pancreatitis 07/11/2014    PCP: Brenda Chandler, NP  REFERRING PROVIDER: Gregor Hams, MD   REFERRING DIAG: [phone] (ICD-10-CM) - Chronic right shoulder pain M62.838 (ICD-10-CM) - Trapezius muscle spasm  THERAPY DIAG:  Acute pain of right shoulder  Abnormal posture  Cramp and spasm  Muscle weakness (generalized)  Rationale for Evaluation and Treatment: Rehabilitation  ONSET DATE: 08/13/23  SUBJECTIVE:                                                                                                                                                                                       SUBJECTIVE STATEMENT: Brenda Mcfarland reports pain is more in neck and elbow today.     PERTINENT HISTORY: history neck pain/surgery, tinnitis, chronic LBP, recent COVID infection with brain fog, glaucoma, obesity, bil knee OA, T2DM, vertigo  PAIN:  Are you having pain? Yes: NPRS scale: did not give number when asked/10 Pain location: R shoulder  Pain description: radiates down to elbow, shooting, throbbing Aggravating factors: moving arm, raising overhead, putting on bra Relieving factors: keeping it still, pain creme, heat   PRECAUTIONS: None  WEIGHT BEARING RESTRICTIONS: No  FALLS:  Has patient fallen in last 6 months? Yes. Number of falls 1 going up stairs, tripped  LIVING ENVIRONMENT: Lives with: lives alone Lives in: House/apartment Stairs: Yes: Internal: 2 steps; on right going up Has following equipment at home: None  OCCUPATION: Retired   PLOF: Independent  PATIENT GOALS:decrease shoulder pain  NEXT MD VISIT:  10/22/2022  OBJECTIVE:   DIAGNOSTIC FINDINGS:  09/11/2022 Limited diagnostic ultrasound: Very large subacromial and subdeltoid bursitis are present. Rotator cuff tendons appear to be intact without large rotator cuff retracted tear. Accuracy of ultrasound limited by body habitus   PATIENT SURVEYS:  Quick Dash 47.7%  COGNITION: Overall cognitive status: Within functional limits for tasks assessed     SENSATION: WFL reports occasional numbness radiating to palmar surface R hand   POSTURE: Forward head, rounded shoulders  CERVICAL ROM:  AROM eval  Cervical Flexion 15  Cervical Extension 20  Cervical Rotation to Left 30  Cervical Rotation to Right 45  Left Sidebend 15  Right Sidebend 10    UPPER EXTREMITY ROM:   Active ROM Right eval Left eval  Shoulder flexion 85 125  Shoulder extension 55 65  Shoulder abduction 85 115  Shoulder adduction    Shoulder internal  rotation 60 70  Shoulder external rotation 50 65  (Blank rows = not tested)  UPPER EXTREMITY MMT:  MMT Right eval Left eval  Shoulder flexion 4+ 5  Shoulder extension    Shoulder abduction  4+ 5  Shoulder adduction    Shoulder internal rotation 4+ 5  Shoulder external rotation 4+ 5  Elbow flexion 5 5  Elbow extension 5 5  Wrist flexion 5 5  Wrist extension 5 5  Grip strength (lbs) good good  (Blank rows = not tested)  SHOULDER SPECIAL TESTS: NT  JOINT MOBILITY TESTING:  NT  PALPATION:  Tightness R upper trap, R levator scapulae, cervical paraspinals, R deltoid and bicips   TODAY'S TREATMENT:                                                                                                                                         DATE:  10/06/2022 Therapeutic Exercise: to improve strength and mobility.  Demo, verbal and tactile cues throughout for technique. UBE L1 x 5 min forward Seated R shoulder isometrics - flexion 5 x 5 sec hold, extension 5 x 5 sec hold, ER 5 x 5 sec hold, IR 5 x 5 sec hold, scap squeezes 5 x 5 sec hold.  UT stretch - uncomfortable Levator stretch 2 x 10 sec hold Manual Therapy: to decrease muscle spasm and pain and improve mobility STM/TPR to R UT, cervical paraspinals, levator scapuale, R deltoid, biceps, skilled palpation and monitoring during dry needling. Trigger Point Dry-Needling  Treatment instructions: Expect mild to moderate muscle soreness. S/S of pneumothorax if dry needled over a lung field, and to seek immediate medical attention should they occur. Patient verbalized understanding of these instructions and education. Patient Consent Given: Yes Education handout provided: Yes Muscles treated: R UT, R biceps Electrical stimulation performed: No Parameters: N/A Treatment response/outcome: Twitch Response Elicited and Palpable Increase in Muscle Length  09/30/22 Self Care: See patient education, initial exercises (shoulder rolls  backwards/shrugs) Ultrasound: x 5 min to R biceps 1 MHz, 1.2 w/cm2 cont to decrease inflammation/pain    PATIENT EDUCATION: Education details: DN, HEP Person educated: Patient Education method: Consulting civil engineer, Demonstration, Verbal cues, and Handouts Education comprehension: verbalized understanding and returned demonstration  HOME EXERCISE PROGRAM: Access Code: [email or web address] URL: [email or web address] Date: 10/06/2022 Prepared by: Glenetta Hew  Exercises - Seated Scapular Retraction  - 3 x daily - 7 x weekly - 1 sets - 5 reps - 5 sec hold - Standing Isometric Shoulder Internal Rotation at Doorway  - 3 x daily - 7 x weekly - 1 sets - 5 reps - 5 sec  hold - Isometric Shoulder External Rotation at Wall  - 3 x daily - 7 x weekly - 1 sets - 5 reps - 5 sec  hold - Isometric Shoulder Extension at Wall  - 3 x daily - 7 x weekly - 1 sets - 5 reps - 5 sec hold - Standing Isometric Shoulder Flexion with Doorway - Arm Bent  - 3 x daily - 7 x weekly - 1 sets - 5 reps - 5 sec  hold - Gentle Levator Scapulae Stretch  -  3 x daily - 7 x weekly - 1 sets - 3 reps - 15 sec  hold  ASSESSMENT:  CLINICAL IMPRESSION: Harbour Nordmeyer was initially very hesitant to perform any exercises, but was able to do UBE forward without increased discomfort, and tolerated isometric exercises well, difficulty mostly with UT stretches.  Given initial HEP then after explanation of DN rational, procedures, outcomes and potential side effects, patient verbalized consent to DN treatment in conjunction with manual STM/DTM and TPR to reduce ttp/muscle tension. Muscles treated as indicated above.  Pt educated to expect mild to moderate muscle soreness for up to 24-48 hrs and instructed to continue prescribed home exercise program and current activity level with pt verbalizing understanding of theses instructions.   Noted at rest she tends to have very rapid horizontal eye movements, when asked to fix gaze diminishes,  briefly discussed continued dizziness/vertigo, advised we could work on here after shoulder pain improved but would need new order.  Annelies Coyt continues to demonstrate potential for improvement and would benefit from continued skilled therapy to address impairments.      OBJECTIVE IMPAIRMENTS: decreased activity tolerance, decreased endurance, decreased mobility, decreased ROM, decreased strength, increased fascial restrictions, increased muscle spasms, impaired flexibility, impaired UE functional use, postural dysfunction, and pain.   ACTIVITY LIMITATIONS: carrying, lifting, sleeping, bed mobility, dressing, reach over head, and hygiene/grooming  PARTICIPATION LIMITATIONS:  sewing  PERSONAL FACTORS: Age, Fitness, Past/current experiences, and 3+ comorbidities: history neck pain/surgery, tinnitis, chronic LBP, recent COVID infection with brain fog, glaucoma, obesity, bil knee OA, T2DM, vertigo  are also affecting patient's functional outcome.   REHAB POTENTIAL: Good  CLINICAL DECISION MAKING: Evolving/moderate complexity  EVALUATION COMPLEXITY: Moderate   GOALS: Goals reviewed with patient? Yes  SHORT TERM GOALS: Target date: 10/14/2022   Patient will be independent with initial HEP.  Baseline: needs Goal status: IN PROGRESS 10/06/22- HEP given.    LONG TERM GOALS: Target date: 11/11/2022   Patient will be independent with advanced/ongoing HEP to improve outcomes and carryover.  Baseline:  Goal status: IN PROGRESS  2.  Patient will report 75% improvement in Right shoulder pain to improve QOL.  Baseline:  Goal status: IN PROGRESS  3. Patient to improve R shoulder AROM to [facility] without pain provocation to allow for increased ease of ADLs.  Baseline: see objective  Goal status: IN PROGRESS  4.  Patient will demonstrate improved functional UE strength as demonstrated by 5/5 R shoulder strength. Baseline: 4+/5 limited by pain Goal status: IN PROGRESS  5.  Patient will report  <40% impairment on QuickDash to demonstrate improved functional ability.  Baseline: 47.7% Goal status: IN PROGRESS  PLAN:  PT FREQUENCY: 2x/week  PT DURATION: 6 weeks  PLANNED INTERVENTIONS: Therapeutic exercises, Therapeutic activity, Neuromuscular re-education, Balance training, Gait training, Patient/Family education, Self Care, Joint mobilization, Dry Needling, Electrical stimulation, Spinal mobilization, Cryotherapy, Moist heat, Taping, Traction, Ultrasound, Ionotophoresis 4mg /ml Dexamethasone, Manual therapy, and Re-evaluation  PLAN FOR NEXT SESSION: postural strengthening, strentches as tolerated manual therapy, modalities PRN.    , PT, DPT  10/06/2022, 11:57 AM

## 2022-10-06 NOTE — Patient Instructions (Signed)

## 2022-10-13 ENCOUNTER — Encounter: Payer: Self-pay | Admitting: Physical Therapy

## 2022-10-13 ENCOUNTER — Ambulatory Visit: Payer: Medicare HMO | Admitting: Physical Therapy

## 2022-10-13 DIAGNOSIS — M25511 Pain in right shoulder: Secondary | ICD-10-CM

## 2022-10-13 DIAGNOSIS — R293 Abnormal posture: Secondary | ICD-10-CM

## 2022-10-13 DIAGNOSIS — M6281 Muscle weakness (generalized): Secondary | ICD-10-CM | POA: Diagnosis not present

## 2022-10-13 DIAGNOSIS — R252 Cramp and spasm: Secondary | ICD-10-CM | POA: Diagnosis not present

## 2022-10-13 NOTE — Therapy (Signed)
OUTPATIENT PHYSICAL THERAPY Treatment   Patient Name: Brenda Mcfarland MRN: [id number] DOB:Jan 02, 1946, 77 y.o., female Today's Date: 10/13/2022  END OF SESSION:  PT End of Session - 10/13/22 1020     Visit Number 3    Number of Visits 12    Date for PT Re-Evaluation 11/11/22    Authorization Type [employer] MCR + State BCBS    Progress Note Due on Visit 10    PT Start Time 1018    PT Stop Time 1102    PT Time Calculation (min) 44 min    Activity Tolerance Patient tolerated treatment well    Behavior During Therapy WFL for tasks assessed/performed             Past Medical History:  Diagnosis Date   Abnormal EKG 10/23/2016   Arthritis 01/18/2020   Chronic fatigue 10/08/2015   Depression 01/18/2020   Diabetes mellitus ([address]) 10/08/2015   Formatting of this note might be different from the original. Overview:  last Hgb A1C 7.2/no blood sugars at home Formatting of this note might be different from the original. last Hgb A1C 7.2/no blood sugars at home   ESR raised 11/27/2016   Frequent urination 12/16/2018   Glaucoma 10/08/2015   High cholesterol    History of pancreatitis 07/11/2014   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/23/2016   Hypertension 10/08/2015   IFG (impaired fasting glucose) 10/24/2019   Incomplete emptying of bladder 11/30/2017   Knee pain, left 12/22/2016   Loss of memory 01/18/2020   Major depressive disorder in partial remission ([place]) 10/08/2015   Morbid obesity ([place]) 10/23/2016   Obesity (BMI 30-39.9) 11/10/2016   Obstructive sleep apnea 12/16/2018   Osteoarthritis of both knees 11/27/2016   Other insomnia 03/05/2017   Palpitations 10/23/2016   Positive ANA (antinuclear antibody) 10/23/2016   Sleep apnea 10/08/2015   Formatting of this note might be different from the original. Overview:  does not use CPAP Formatting of this note might be different from the original. does not use CPAP   Sore neck 10/24/2019   Spondylosis of cervical spine with myelopathy 01/18/2020   Type 2 diabetes mellitus without  complication, without long-term current use of insulin ([place]) 12/16/2018   Vitamin D deficiency 10/08/2015   Past Surgical History:  Procedure Laterality Date   APPENDECTOMY     age 68   CESAREAN SECTION     X [place]  2015   SPINE SURGERY  05/29/2015   Patient Active Problem List   Diagnosis Date Noted   Nocturia 05/15/2022   Paroxysmal SVT (supraventricular tachycardia) 01/22/2020   Arthritis 01/18/2020   Depression 01/18/2020   Loss of memory 01/18/2020   Spondylosis of cervical spine with myelopathy 01/18/2020   IFG (impaired fasting glucose) 10/24/2019   Frequent urination 12/16/2018   Obstructive sleep apnea 12/16/2018   Type 2 diabetes mellitus without complication, without long-term current use of insulin ([place]) 12/16/2018   Incomplete emptying of bladder 11/30/2017   Insomnia 03/05/2017   Knee pain, left 12/22/2016   ESR raised 11/27/2016   Osteoarthritis of both knees 11/27/2016   Obesity (BMI 30-39.9) 11/10/2016   Abnormal EKG 10/23/2016   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/23/2016   Morbid obesity ([place]) 10/23/2016   Palpitations 10/23/2016   Positive ANA (antinuclear antibody) 10/23/2016   Hypertension 10/08/2015   Chronic fatigue 10/08/2015   Diabetes mellitus ([place]) 10/08/2015   Glaucoma 10/08/2015   Major depressive disorder in partial remission ([place]) 10/08/2015   Vitamin D deficiency 10/08/2015   Sleep  apnea 10/08/2015   History of pancreatitis 07/11/2014    PCP: Lauree Chandler, NP  REFERRING PROVIDER: Gregor Hams, MD   REFERRING DIAG: [phone] (ICD-10-CM) - Chronic right shoulder pain M62.838 (ICD-10-CM) - Trapezius muscle spasm  THERAPY DIAG:  Acute pain of right shoulder  Abnormal posture  Cramp and spasm  Rationale for Evaluation and Treatment: Rehabilitation  ONSET DATE: 08/13/23  SUBJECTIVE:                                                                                                                                                                                       SUBJECTIVE STATEMENT: Brenda Mcfarland reports she was sore after needling but did feel it was helpful and would like to do again.  She reports pain with exercises, and even pulling covers up on bed hurts.     PERTINENT HISTORY: history neck pain/surgery, tinnitis, chronic LBP, recent COVID infection with brain fog, glaucoma, obesity, bil knee OA, T2DM, vertigo  PAIN:  Are you having pain? Yes: NPRS scale: 3-4/10 Pain location: neck to R arm  Pain description: radiates down to elbow, shooting, throbbing Aggravating factors: moving arm, raising overhead, putting on bra Relieving factors: keeping it still, pain creme, heat   PRECAUTIONS: None  WEIGHT BEARING RESTRICTIONS: No  FALLS:  Has patient fallen in last 6 months? Yes. Number of falls 1 going up stairs, tripped  LIVING ENVIRONMENT: Lives with: lives alone Lives in: House/apartment Stairs: Yes: Internal: 2 steps; on right going up Has following equipment at home: None  OCCUPATION: Retired   PLOF: Independent  PATIENT GOALS:decrease shoulder pain  NEXT MD VISIT:  10/22/2022  OBJECTIVE:   DIAGNOSTIC FINDINGS:  09/11/2022 Limited diagnostic ultrasound: Very large subacromial and subdeltoid bursitis are present. Rotator cuff tendons appear to be intact without large rotator cuff retracted tear. Accuracy of ultrasound limited by body habitus   PATIENT SURVEYS:  Quick Dash 47.7%  COGNITION: Overall cognitive status: Within functional limits for tasks assessed     SENSATION: WFL reports occasional numbness radiating to palmar surface R hand   POSTURE: Forward head, rounded shoulders  CERVICAL ROM:  AROM eval  Cervical Flexion 15  Cervical Extension 20  Cervical Rotation to Left 30  Cervical Rotation to Right 45  Left Sidebend 15  Right Sidebend 10    UPPER EXTREMITY ROM:   Active ROM Right eval Left eval  Shoulder flexion 85 125  Shoulder extension 55 65  Shoulder  abduction 85 115  Shoulder adduction    Shoulder internal rotation 60 70  Shoulder external rotation 50 65  (Blank rows = not tested)  UPPER EXTREMITY MMT:  MMT Right  eval Left eval  Shoulder flexion 4+ 5  Shoulder extension    Shoulder abduction 4+ 5  Shoulder adduction    Shoulder internal rotation 4+ 5  Shoulder external rotation 4+ 5  Elbow flexion 5 5  Elbow extension 5 5  Wrist flexion 5 5  Wrist extension 5 5  Grip strength (lbs) good good  (Blank rows = not tested)  SHOULDER SPECIAL TESTS: NT  JOINT MOBILITY TESTING:  NT  PALPATION:  Tightness R upper trap, R levator scapulae, cervical paraspinals, R deltoid and bicips   TODAY'S TREATMENT:                                                                                                                                         DATE:   10/13/22 Therapeutic Exercise: to improve strength and mobility.  Demo, verbal and tactile cues throughout for technique. Review of HEP R shoulder isometrics - flexion 5 x 5 sec hold, extension 5 x 5 sec hold, ER 5 x 5 sec hold, IR 5 x 5 sec hold, scap squeezes 5 x 5 sec hold.  Chin tucks 5 x 5 sec hold Levator stretch 2 x 10 sec hold Manual Therapy: to decrease muscle spasm and pain and improve mobility STM/TPR to R UT, cervical paraspinals, levator scapuale, suboccipitals, R deltoid, biceps, skilled palpation and monitoring during dry needling. Trigger Point Dry-Needling  Treatment instructions: Expect mild to moderate muscle soreness. S/S of pneumothorax if dry needled over a lung field, and to seek immediate medical attention should they occur. Patient verbalized understanding of these instructions and education. Patient Consent Given: Yes Education handout provided: Yes Muscles treated: R UT, R L/S Electrical stimulation performed: No Parameters: N/A Treatment response/outcome: Twitch Response Elicited and Palpable Increase in Muscle Length Modalities: MHP x 10 min to neck in  sitting (after session concluded, not included in treatment time).    10/06/2022 Therapeutic Exercise: to improve strength and mobility.  Demo, verbal and tactile cues throughout for technique. UBE L1 x 5 min forward Seated R shoulder isometrics - flexion 5 x 5 sec hold, extension 5 x 5 sec hold, ER 5 x 5 sec hold, IR 5 x 5 sec hold, scap squeezes 5 x 5 sec hold.  UT stretch - uncomfortable Levator stretch 2 x 10 sec hold Manual Therapy: to decrease muscle spasm and pain and improve mobility STM/TPR to R UT, cervical paraspinals, levator scapuale, R deltoid, biceps, skilled palpation and monitoring during dry needling. Trigger Point Dry-Needling  Treatment instructions: Expect mild to moderate muscle soreness. S/S of pneumothorax if dry needled over a lung field, and to seek immediate medical attention should they occur. Patient verbalized understanding of these instructions and education. Patient Consent Given: Yes Education handout provided: Yes Muscles treated: R UT, R biceps Electrical stimulation performed: No Parameters: N/A Treatment response/outcome: Twitch Response Elicited and Palpable Increase in Muscle Length  09/30/22  Self Care: See patient education, initial exercises (shoulder rolls backwards/shrugs) Ultrasound: x 5 min to R biceps 1 MHz, 1.2 w/cm2 cont to decrease inflammation/pain    PATIENT EDUCATION: Education details: DN, HEP Person educated: Patient Education method: Consulting civil engineer, Demonstration, Verbal cues, and Handouts Education comprehension: verbalized understanding and returned demonstration  HOME EXERCISE PROGRAM: Access Code: [email or web address] URL: [email or web address] Date: 10/06/2022 Prepared by: Glenetta Hew  Exercises - Seated Scapular Retraction  - 3 x daily - 7 x weekly - 1 sets - 5 reps - 5 sec hold - Standing Isometric Shoulder Internal Rotation at Doorway  - 3 x daily - 7 x weekly - 1 sets - 5 reps - 5 sec  hold - Isometric Shoulder  External Rotation at Wall  - 3 x daily - 7 x weekly - 1 sets - 5 reps - 5 sec  hold - Isometric Shoulder Extension at Wall  - 3 x daily - 7 x weekly - 1 sets - 5 reps - 5 sec hold - Standing Isometric Shoulder Flexion with Doorway - Arm Bent  - 3 x daily - 7 x weekly - 1 sets - 5 reps - 5 sec  hold - Gentle Levator Scapulae Stretch  - 3 x daily - 7 x weekly - 1 sets - 3 reps - 15 sec  hold  ASSESSMENT:  CLINICAL IMPRESSION: Louraine Herbst reported difficulty with HEP and increased hand pain, so reviewed in standing using wall to avoid any pressure on hand, also problem solving how to do with limited wall space, also reviewing form with stretches and focusing on stretching and shoulder rolls in pain free ROM, after which reported decreased pain.  Manual therapy focused on suboccipitals which were very tight today, followed by TrDN again to R UT and L/S.  MHP applied at end of session for further muscle relaxation.  Jyah Bonnet continues to demonstrate potential for improvement and would benefit from continued skilled therapy to address impairments.      OBJECTIVE IMPAIRMENTS: decreased activity tolerance, decreased endurance, decreased mobility, decreased ROM, decreased strength, increased fascial restrictions, increased muscle spasms, impaired flexibility, impaired UE functional use, postural dysfunction, and pain.   ACTIVITY LIMITATIONS: carrying, lifting, sleeping, bed mobility, dressing, reach over head, and hygiene/grooming  PARTICIPATION LIMITATIONS:  sewing  PERSONAL FACTORS: Age, Fitness, Past/current experiences, and 3+ comorbidities: history neck pain/surgery, tinnitis, chronic LBP, recent COVID infection with brain fog, glaucoma, obesity, bil knee OA, T2DM, vertigo  are also affecting patient's functional outcome.   REHAB POTENTIAL: Good  CLINICAL DECISION MAKING: Evolving/moderate complexity  EVALUATION COMPLEXITY: Moderate   GOALS: Goals reviewed with patient? Yes  SHORT  TERM GOALS: Target date: 10/14/2022   Patient will be independent with initial HEP.  Baseline: needs Goal status: IN PROGRESS 10/06/22- HEP given.    LONG TERM GOALS: Target date: 11/11/2022   Patient will be independent with advanced/ongoing HEP to improve outcomes and carryover.  Baseline:  Goal status: IN PROGRESS  2.  Patient will report 75% improvement in Right shoulder pain to improve QOL.  Baseline:  Goal status: IN PROGRESS  3. Patient to improve R shoulder AROM to [facility] without pain provocation to allow for increased ease of ADLs.  Baseline: see objective  Goal status: IN PROGRESS  4.  Patient will demonstrate improved functional UE strength as demonstrated by 5/5 R shoulder strength. Baseline: 4+/5 limited by pain Goal status: IN PROGRESS  5.  Patient will report <40% impairment on QuickDash to demonstrate improved functional ability.  Baseline:  47.7% Goal status: IN PROGRESS  PLAN:  PT FREQUENCY: 2x/week  PT DURATION: 6 weeks  PLANNED INTERVENTIONS: Therapeutic exercises, Therapeutic activity, Neuromuscular re-education, Balance training, Gait training, Patient/Family education, Self Care, Joint mobilization, Dry Needling, Electrical stimulation, Spinal mobilization, Cryotherapy, Moist heat, Taping, Traction, Ultrasound, Ionotophoresis 60m/ml Dexamethasone, Manual therapy, and Re-evaluation  PLAN FOR NEXT SESSION: postural strengthening, strentches as tolerated manual therapy, modalities PRN.    ERennie Natter PT, DPT  10/13/2022, 12:08 PM

## 2022-10-14 DIAGNOSIS — G4733 Obstructive sleep apnea (adult) (pediatric): Secondary | ICD-10-CM | POA: Diagnosis not present

## 2022-10-15 ENCOUNTER — Encounter: Payer: Self-pay | Admitting: Physical Therapy

## 2022-10-15 ENCOUNTER — Ambulatory Visit: Payer: Medicare HMO | Admitting: Physical Therapy

## 2022-10-15 DIAGNOSIS — M25511 Pain in right shoulder: Secondary | ICD-10-CM | POA: Diagnosis not present

## 2022-10-15 DIAGNOSIS — G4733 Obstructive sleep apnea (adult) (pediatric): Secondary | ICD-10-CM | POA: Diagnosis not present

## 2022-10-15 DIAGNOSIS — M6281 Muscle weakness (generalized): Secondary | ICD-10-CM | POA: Diagnosis not present

## 2022-10-15 DIAGNOSIS — R293 Abnormal posture: Secondary | ICD-10-CM | POA: Diagnosis not present

## 2022-10-15 DIAGNOSIS — R252 Cramp and spasm: Secondary | ICD-10-CM

## 2022-10-15 NOTE — Therapy (Signed)
OUTPATIENT PHYSICAL THERAPY Treatment   Patient Name: Shahnaz Klass MRN: [id number] DOB:[birth year], 77 y.o., female Today's Date: 10/15/2022  END OF SESSION:  PT End of Session - 10/15/22 1022     Visit Number 4    Number of Visits 12    Date for PT Re-Evaluation 11/11/22    Authorization Type [employer] MCR + State BCBS    Progress Note Due on Visit 10    PT Start Time 1019    PT Stop Time 1105    PT Time Calculation (min) 46 min    Activity Tolerance Patient tolerated treatment well    Behavior During Therapy WFL for tasks assessed/performed             Past Medical History:  Diagnosis Date   Abnormal EKG 10/23/2016   Arthritis 01/18/2020   Chronic fatigue 10/08/2015   Depression 01/18/2020   Diabetes mellitus ([place]) 10/08/2015   Formatting of this note might be different from the original. Overview:  last Hgb A1C 7.2/no blood sugars at home Formatting of this note might be different from the original. last Hgb A1C 7.2/no blood sugars at home   ESR raised 11/27/2016   Frequent urination 12/16/2018   Glaucoma 10/08/2015   High cholesterol    History of pancreatitis 07/11/2014   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/23/2016   Hypertension 10/08/2015   IFG (impaired fasting glucose) 10/24/2019   Incomplete emptying of bladder 11/30/2017   Knee pain, left 12/22/2016   Loss of memory 01/18/2020   Major depressive disorder in partial remission ([place]) 10/08/2015   Morbid obesity ([place]) 10/23/2016   Obesity (BMI 30-39.9) 11/10/2016   Obstructive sleep apnea 12/16/2018   Osteoarthritis of both knees 11/27/2016   Other insomnia 03/05/2017   Palpitations 10/23/2016   Positive ANA (antinuclear antibody) 10/23/2016   Sleep apnea 10/08/2015   Formatting of this note might be different from the original. Overview:  does not use CPAP Formatting of this note might be different from the original. does not use CPAP   Sore neck 10/24/2019   Spondylosis of cervical spine with myelopathy 01/18/2020   Type 2 diabetes mellitus without  complication, without long-term current use of insulin ([place]) 12/16/2018   Vitamin D deficiency 10/08/2015   Past Surgical History:  Procedure Laterality Date   APPENDECTOMY     age 56   CESAREAN SECTION     X [place]  2015   SPINE SURGERY  05/29/2015   Patient Active Problem List   Diagnosis Date Noted   Nocturia 05/15/2022   Paroxysmal SVT (supraventricular tachycardia) 01/22/2020   Arthritis 01/18/2020   Depression 01/18/2020   Loss of memory 01/18/2020   Spondylosis of cervical spine with myelopathy 01/18/2020   IFG (impaired fasting glucose) 10/24/2019   Frequent urination 12/16/2018   Obstructive sleep apnea 12/16/2018   Type 2 diabetes mellitus without complication, without long-term current use of insulin ([place]) 12/16/2018   Incomplete emptying of bladder 11/30/2017   Insomnia 03/05/2017   Knee pain, left 12/22/2016   ESR raised 11/27/2016   Osteoarthritis of both knees 11/27/2016   Obesity (BMI 30-39.9) 11/10/2016   Abnormal EKG 10/23/2016   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/23/2016   Morbid obesity ([place]) 10/23/2016   Palpitations 10/23/2016   Positive ANA (antinuclear antibody) 10/23/2016   Hypertension 10/08/2015   Chronic fatigue 10/08/2015   Diabetes mellitus ([place]) 10/08/2015   Glaucoma 10/08/2015   Major depressive disorder in partial remission ([place]) 10/08/2015   Vitamin D deficiency 10/08/2015   Sleep  apnea 10/08/2015   History of pancreatitis 07/11/2014    PCP: Lauree Chandler, NP  REFERRING PROVIDER: Gregor Hams, MD   REFERRING DIAG: [phone] (ICD-10-CM) - Chronic right shoulder pain M62.838 (ICD-10-CM) - Trapezius muscle spasm  THERAPY DIAG:  Acute pain of right shoulder  Abnormal posture  Cramp and spasm  Muscle weakness (generalized)  Rationale for Evaluation and Treatment: Rehabilitation  ONSET DATE: 08/13/23  SUBJECTIVE:                                                                                                                                                                                       SUBJECTIVE STATEMENT: Jennarae Knechtel reports was sore yesterday but today is better.  Still thinks the exercises are aggravating but doing them anyway.    Has follow-up with Dr. Georgina Snell next week.   NEXT MD VISIT:  10/22/2022  PERTINENT HISTORY: history neck pain/surgery, tinnitis, chronic LBP, recent COVID infection with brain fog, glaucoma, obesity, bil knee OA, T2DM, vertigo  PAIN:  Are you having pain? Yes: NPRS scale: 2-3/10 Pain location: neck to R arm  Pain description: radiates down to elbow, shooting, throbbing Aggravating factors: moving arm, raising overhead, putting on bra Relieving factors: keeping it still, pain creme, heat   PRECAUTIONS: None  WEIGHT BEARING RESTRICTIONS: No  FALLS:  Has patient fallen in last 6 months? Yes. Number of falls 1 going up stairs, tripped  LIVING ENVIRONMENT: Lives with: lives alone Lives in: House/apartment Stairs: Yes: Internal: 2 steps; on right going up Has following equipment at home: None  OCCUPATION: Retired   PLOF: Independent  PATIENT GOALS:decrease shoulder pain  NEXT MD VISIT:  10/22/2022  OBJECTIVE:   DIAGNOSTIC FINDINGS:  09/11/2022 Limited diagnostic ultrasound: Very large subacromial and subdeltoid bursitis are present. Rotator cuff tendons appear to be intact without large rotator cuff retracted tear. Accuracy of ultrasound limited by body habitus   PATIENT SURVEYS:  Quick Dash 47.7%  COGNITION: Overall cognitive status: Within functional limits for tasks assessed     SENSATION: WFL reports occasional numbness radiating to palmar surface R hand   POSTURE: Forward head, rounded shoulders  CERVICAL ROM:  AROM eval  Cervical Flexion 15  Cervical Extension 20  Cervical Rotation to Left 30  Cervical Rotation to Right 45  Left Sidebend 15  Right Sidebend 10    UPPER EXTREMITY ROM:   Active ROM Right eval Left eval  Shoulder  flexion 85 125  Shoulder extension 55 65  Shoulder abduction 85 115  Shoulder adduction    Shoulder internal rotation 60 70  Shoulder external rotation 50 65  (Blank rows = not tested)  UPPER  EXTREMITY MMT:  MMT Right eval Left eval  Shoulder flexion 4+ 5  Shoulder extension    Shoulder abduction 4+ 5  Shoulder adduction    Shoulder internal rotation 4+ 5  Shoulder external rotation 4+ 5  Elbow flexion 5 5  Elbow extension 5 5  Wrist flexion 5 5  Wrist extension 5 5  Grip strength (lbs) good good  (Blank rows = not tested)  SHOULDER SPECIAL TESTS: NT  JOINT MOBILITY TESTING:  NT  PALPATION:  Tightness R upper trap, R levator scapulae, cervical paraspinals, R deltoid and bicips   TODAY'S TREATMENT:                                                                                                                                         DATE:   10/15/22 Therapeutic Exercise: to improve strength and mobility.  Demo, verbal and tactile cues throughout for technique. Chin tucks, shoulder rolls. Seated table slides scaption x 20 Standing rows YTB x 20 Standing shoulder extension YTB x 20 Standing shoulder bil ER YTB x 20 Manual Therapy: to decrease muscle spasm and pain and improve mobility.  STM/TPR to R UT, cervical paraspinals, levator scapuale, suboccipitals, , skilled palpation and monitoring during dry needling. Trigger Point Dry-Needling  Treatment instructions: Expect mild to moderate muscle soreness. S/S of pneumothorax if dry needled over a lung field, and to seek immediate medical attention should they occur. Patient verbalized understanding of these instructions and education. Patient Consent Given: Yes Education handout provided: Yes Muscles treated: R UT, R L/S Electrical stimulation performed: No Parameters: N/A Treatment response/outcome: Twitch Response Elicited and Palpable Increase in Muscle Length  10/13/22 Therapeutic Exercise: to improve strength and  mobility.  Demo, verbal and tactile cues throughout for technique. Review of HEP R shoulder isometrics - flexion 5 x 5 sec hold, extension 5 x 5 sec hold, ER 5 x 5 sec hold, IR 5 x 5 sec hold, scap squeezes 5 x 5 sec hold.  Chin tucks 5 x 5 sec hold Levator stretch 2 x 10 sec hold Manual Therapy: to decrease muscle spasm and pain and improve mobility STM/TPR to R UT, cervical paraspinals, levator scapuale, suboccipitals, R deltoid, biceps, skilled palpation and monitoring during dry needling. Trigger Point Dry-Needling  Treatment instructions: Expect mild to moderate muscle soreness. S/S of pneumothorax if dry needled over a lung field, and to seek immediate medical attention should they occur. Patient verbalized understanding of these instructions and education. Patient Consent Given: Yes Education handout provided: Yes Muscles treated: R UT, R L/S Electrical stimulation performed: No Parameters: N/A Treatment response/outcome: Twitch Response Elicited and Palpable Increase in Muscle Length Modalities: MHP x 10 min to neck in sitting (after session concluded, not included in treatment time).    10/06/2022 Therapeutic Exercise: to improve strength and mobility.  Demo, verbal and tactile cues throughout for technique. UBE L1 x 5 min  forward Seated R shoulder isometrics - flexion 5 x 5 sec hold, extension 5 x 5 sec hold, ER 5 x 5 sec hold, IR 5 x 5 sec hold, scap squeezes 5 x 5 sec hold.  UT stretch - uncomfortable Levator stretch 2 x 10 sec hold Manual Therapy: to decrease muscle spasm and pain and improve mobility STM/TPR to R UT, cervical paraspinals, levator scapuale, R deltoid, biceps, skilled palpation and monitoring during dry needling. Trigger Point Dry-Needling  Treatment instructions: Expect mild to moderate muscle soreness. S/S of pneumothorax if dry needled over a lung field, and to seek immediate medical attention should they occur. Patient verbalized understanding of these  instructions and education. Patient Consent Given: Yes Education handout provided: Yes Muscles treated: R UT, R biceps Electrical stimulation performed: No Parameters: N/A Treatment response/outcome: Twitch Response Elicited and Palpable Increase in Muscle Length    PATIENT EDUCATION: Education details: HEP update 10/15/22 Person educated: Patient Education method: Explanation, Demonstration, Verbal cues, and Handouts Education comprehension: verbalized understanding and returned demonstration  HOME EXERCISE PROGRAM: Access Code: [email or web address] URL: [email or web address] Date: 10/15/2022 Prepared by: Glenetta Hew  Exercises Added   - Shoulder extension with resistance - Neutral  - 1 x daily - 7 x weekly - 2 sets - 10 reps - Scapular Retraction with Resistance  - 1 x daily - 7 x weekly - 2 sets - 10 reps - Standing Shoulder External Rotation with Resistance  - 1 x daily - 7 x weekly - 2 sets - 10 reps - Seated Shoulder Flexion Towel Slide at Table Top  - 1 x daily - 7 x weekly - 2 sets - 10 reps  ASSESSMENT:  CLINICAL IMPRESSION: Tekeya Koegler still reports difficulty with current HEP, so today started more active shoulder exercises, including table slides and yellow theraband, which she tolerated well.  Updated HEP and to discontinue isometrics.  Noted tightness especially in R suboccipitals with manual therapy, reported good response to interventions and decreased pain at end of session.  Kacie Diers continues to demonstrate potential for improvement and would benefit from continued skilled therapy to address impairments.      OBJECTIVE IMPAIRMENTS: decreased activity tolerance, decreased endurance, decreased mobility, decreased ROM, decreased strength, increased fascial restrictions, increased muscle spasms, impaired flexibility, impaired UE functional use, postural dysfunction, and pain.   ACTIVITY LIMITATIONS: carrying, lifting, sleeping, bed mobility, dressing,  reach over head, and hygiene/grooming  PARTICIPATION LIMITATIONS:  sewing  PERSONAL FACTORS: Age, Fitness, Past/current experiences, and 3+ comorbidities: history neck pain/surgery, tinnitis, chronic LBP, recent COVID infection with brain fog, glaucoma, obesity, bil knee OA, T2DM, vertigo  are also affecting patient's functional outcome.   REHAB POTENTIAL: Good  CLINICAL DECISION MAKING: Evolving/moderate complexity  EVALUATION COMPLEXITY: Moderate   GOALS: Goals reviewed with patient? Yes  SHORT TERM GOALS: Target date: 10/14/2022   Patient will be independent with initial HEP.  Baseline: needs Goal status: IN PROGRESS 10/06/22- HEP given.  10/15/22- initial HEP modified   LONG TERM GOALS: Target date: 11/11/2022   Patient will be independent with advanced/ongoing HEP to improve outcomes and carryover.  Baseline:  Goal status: IN PROGRESS  2.  Patient will report 75% improvement in Right shoulder pain to improve QOL.  Baseline:  Goal status: IN PROGRESS  3. Patient to improve R shoulder AROM to [facility] without pain provocation to allow for increased ease of ADLs.  Baseline: see objective  Goal status: IN PROGRESS  4.  Patient will demonstrate improved functional UE strength as demonstrated  by 5/5 R shoulder strength. Baseline: 4+/5 limited by pain Goal status: IN PROGRESS  5.  Patient will report <40% impairment on QuickDash to demonstrate improved functional ability.  Baseline: 47.7% Goal status: IN PROGRESS  PLAN:  PT FREQUENCY: 2x/week  PT DURATION: 6 weeks  PLANNED INTERVENTIONS: Therapeutic exercises, Therapeutic activity, Neuromuscular re-education, Balance training, Gait training, Patient/Family education, Self Care, Joint mobilization, Dry Needling, Electrical stimulation, Spinal mobilization, Cryotherapy, Moist heat, Taping, Traction, Ultrasound, Ionotophoresis 66m/ml Dexamethasone, Manual therapy, and Re-evaluation  PLAN FOR NEXT SESSION: postural strengthening,  stretches as tolerated, manual therapy and TrDN, modalities PRN.    ERennie Natter PT, DPT  10/15/2022, 11:22 AM

## 2022-10-19 ENCOUNTER — Ambulatory Visit: Payer: Medicare HMO | Admitting: Physical Therapy

## 2022-10-19 ENCOUNTER — Encounter: Payer: Self-pay | Admitting: Physical Therapy

## 2022-10-19 DIAGNOSIS — M6281 Muscle weakness (generalized): Secondary | ICD-10-CM

## 2022-10-19 DIAGNOSIS — R252 Cramp and spasm: Secondary | ICD-10-CM | POA: Diagnosis not present

## 2022-10-19 DIAGNOSIS — R293 Abnormal posture: Secondary | ICD-10-CM | POA: Diagnosis not present

## 2022-10-19 DIAGNOSIS — M25511 Pain in right shoulder: Secondary | ICD-10-CM

## 2022-10-19 NOTE — Therapy (Signed)
OUTPATIENT PHYSICAL THERAPY Treatment   Patient Name: Brenda Mcfarland MRN: [id number] DOB:04-30-1946, 77 y.o., female Today's Date: 10/19/2022  END OF SESSION:  PT End of Session - 10/19/22 1023     Visit Number 5    Number of Visits 12    Date for PT Re-Evaluation 11/11/22    Authorization Type [employer] MCR + State BCBS    Progress Note Due on Visit 10    PT Start Time 1019    PT Stop Time 1104    PT Time Calculation (min) 45 min    Activity Tolerance Patient tolerated treatment well    Behavior During Therapy WFL for tasks assessed/performed             Past Medical History:  Diagnosis Date   Abnormal EKG 10/23/2016   Arthritis 01/18/2020   Chronic fatigue 10/08/2015   Depression 01/18/2020   Diabetes mellitus ([place]) 10/08/2015   Formatting of this note might be different from the original. Overview:  last Hgb A1C 7.2/no blood sugars at home Formatting of this note might be different from the original. last Hgb A1C 7.2/no blood sugars at home   ESR raised 11/27/2016   Frequent urination 12/16/2018   Glaucoma 10/08/2015   High cholesterol    History of pancreatitis 07/11/2014   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/23/2016   Hypertension 10/08/2015   IFG (impaired fasting glucose) 10/24/2019   Incomplete emptying of bladder 11/30/2017   Knee pain, left 12/22/2016   Loss of memory 01/18/2020   Major depressive disorder in partial remission ([place]) 10/08/2015   Morbid obesity ([place]) 10/23/2016   Obesity (BMI 30-39.9) 11/10/2016   Obstructive sleep apnea 12/16/2018   Osteoarthritis of both knees 11/27/2016   Other insomnia 03/05/2017   Palpitations 10/23/2016   Positive ANA (antinuclear antibody) 10/23/2016   Sleep apnea 10/08/2015   Formatting of this note might be different from the original. Overview:  does not use CPAP Formatting of this note might be different from the original. does not use CPAP   Sore neck 10/24/2019   Spondylosis of cervical spine with myelopathy 01/18/2020   Type 2 diabetes mellitus without  complication, without long-term current use of insulin ([place]) 12/16/2018   Vitamin D deficiency 10/08/2015   Past Surgical History:  Procedure Laterality Date   APPENDECTOMY     age 66   CESAREAN SECTION     X [place]  2015   SPINE SURGERY  05/29/2015   Patient Active Problem List   Diagnosis Date Noted   Nocturia 05/15/2022   Paroxysmal SVT (supraventricular tachycardia) 01/22/2020   Arthritis 01/18/2020   Depression 01/18/2020   Loss of memory 01/18/2020   Spondylosis of cervical spine with myelopathy 01/18/2020   IFG (impaired fasting glucose) 10/24/2019   Frequent urination 12/16/2018   Obstructive sleep apnea 12/16/2018   Type 2 diabetes mellitus without complication, without long-term current use of insulin ([place]) 12/16/2018   Incomplete emptying of bladder 11/30/2017   Insomnia 03/05/2017   Knee pain, left 12/22/2016   ESR raised 11/27/2016   Osteoarthritis of both knees 11/27/2016   Obesity (BMI 30-39.9) 11/10/2016   Abnormal EKG 10/23/2016   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/23/2016   Morbid obesity ([place]) 10/23/2016   Palpitations 10/23/2016   Positive ANA (antinuclear antibody) 10/23/2016   Hypertension 10/08/2015   Chronic fatigue 10/08/2015   Diabetes mellitus ([place]) 10/08/2015   Glaucoma 10/08/2015   Major depressive disorder in partial remission ([place]) 10/08/2015   Vitamin D deficiency 10/08/2015   Sleep  apnea 10/08/2015   History of pancreatitis 07/11/2014    PCP: Lauree Chandler, NP  REFERRING PROVIDER: Gregor Hams, MD   REFERRING DIAG: [phone] (ICD-10-CM) - Chronic right shoulder pain M62.838 (ICD-10-CM) - Trapezius muscle spasm  THERAPY DIAG:  Acute pain of right shoulder  Abnormal posture  Cramp and spasm  Muscle weakness (generalized)  Rationale for Evaluation and Treatment: Rehabilitation  ONSET DATE: 08/13/23  SUBJECTIVE:                                                                                                                                                                                       SUBJECTIVE STATEMENT: Brenda Mcfarland reports she had a bad day yesterday, just didn't have any energy.  Had a lot of pain too last night, used Radiographer, therapeutic and that helped.  Not too bad today.  Trying to do her exercises at home.     NEXT MD VISIT:  10/22/2022  PERTINENT HISTORY: history neck pain/surgery, tinnitis, chronic LBP, recent COVID infection with brain fog, glaucoma, obesity, bil knee OA, T2DM, vertigo  PAIN:  Are you having pain? Yes: NPRS scale: 2-3/10 Pain location: neck to R arm  Pain description: radiates down to elbow, shooting, throbbing Aggravating factors: moving arm, raising overhead, putting on bra Relieving factors: keeping it still, pain creme, heat   PRECAUTIONS: None  WEIGHT BEARING RESTRICTIONS: No  FALLS:  Has patient fallen in last 6 months? Yes. Number of falls 1 going up stairs, tripped  LIVING ENVIRONMENT: Lives with: lives alone Lives in: House/apartment Stairs: Yes: Internal: 2 steps; on right going up Has following equipment at home: None  OCCUPATION: Retired   PLOF: Independent  PATIENT GOALS:decrease shoulder pain  NEXT MD VISIT:  10/22/2022  OBJECTIVE:   DIAGNOSTIC FINDINGS:  09/11/2022 Limited diagnostic ultrasound: Very large subacromial and subdeltoid bursitis are present. Rotator cuff tendons appear to be intact without large rotator cuff retracted tear. Accuracy of ultrasound limited by body habitus   PATIENT SURVEYS:  Quick Dash 47.7%  COGNITION: Overall cognitive status: Within functional limits for tasks assessed     SENSATION: WFL reports occasional numbness radiating to palmar surface R hand   POSTURE: Forward head, rounded shoulders  CERVICAL ROM:  AROM eval  Cervical Flexion 15  Cervical Extension 20  Cervical Rotation to Left 30  Cervical Rotation to Right 45  Left Sidebend 15  Right Sidebend 10    UPPER EXTREMITY ROM:    Active ROM Right eval Left eval  Shoulder flexion 85 125  Shoulder extension 55 65  Shoulder abduction 85 115  Shoulder adduction    Shoulder internal rotation 60 70  Shoulder external rotation 50 65  (Blank rows = not tested)  UPPER EXTREMITY MMT:  MMT Right eval Left eval  Shoulder flexion 4+ 5  Shoulder extension    Shoulder abduction 4+ 5  Shoulder adduction    Shoulder internal rotation 4+ 5  Shoulder external rotation 4+ 5  Elbow flexion 5 5  Elbow extension 5 5  Wrist flexion 5 5  Wrist extension 5 5  Grip strength (lbs) good good  (Blank rows = not tested)  SHOULDER SPECIAL TESTS: NT  JOINT MOBILITY TESTING:  NT  PALPATION:  Tightness R upper trap, R levator scapulae, cervical paraspinals, R deltoid and bicips   TODAY'S TREATMENT:                                                                                                                                         DATE:   10/19/22 Therapeutic Exercise: to improve strength and mobility.  Demo, verbal and tactile cues throughout for technique. UBE x 6 min (52f2b) Table slides x 10 Shoulder rolls retro x 20 Chin tucks x 20  Manual Therapy: to decrease muscle spasm and pain and improve mobility STM/TPR to cervical paraspinals, R UT, R L/S, R SCM, anterior scalenes,  R 1st rib mobs, IASTM to R deltoids.   Pec stretch with gentle overpressure.    10/15/22 Therapeutic Exercise: to improve strength and mobility.  Demo, verbal and tactile cues throughout for technique. Chin tucks, shoulder rolls. Seated table slides scaption x 20 Standing rows YTB x 20 Standing shoulder extension YTB x 20 Standing shoulder bil ER YTB x 20 Manual Therapy: to decrease muscle spasm and pain and improve mobility.  STM/TPR to R UT, cervical paraspinals, levator scapuale, suboccipitals, , skilled palpation and monitoring during dry needling. Trigger Point Dry-Needling  Treatment instructions: Expect mild to moderate muscle  soreness. S/S of pneumothorax if dry needled over a lung field, and to seek immediate medical attention should they occur. Patient verbalized understanding of these instructions and education. Patient Consent Given: Yes Education handout provided: Yes Muscles treated: R UT, R L/S Electrical stimulation performed: No Parameters: N/A Treatment response/outcome: Twitch Response Elicited and Palpable Increase in Muscle Length  10/13/22 Therapeutic Exercise: to improve strength and mobility.  Demo, verbal and tactile cues throughout for technique. Review of HEP R shoulder isometrics - flexion 5 x 5 sec hold, extension 5 x 5 sec hold, ER 5 x 5 sec hold, IR 5 x 5 sec hold, scap squeezes 5 x 5 sec hold.  Chin tucks 5 x 5 sec hold Levator stretch 2 x 10 sec hold Manual Therapy: to decrease muscle spasm and pain and improve mobility STM/TPR to R UT, cervical paraspinals, levator scapuale, suboccipitals, R deltoid, biceps, skilled palpation and monitoring during dry needling. Trigger Point Dry-Needling  Treatment instructions: Expect mild to moderate muscle soreness. S/S of pneumothorax if dry needled over a lung  field, and to seek immediate medical attention should they occur. Patient verbalized understanding of these instructions and education. Patient Consent Given: Yes Education handout provided: Yes Muscles treated: R UT, R L/S Electrical stimulation performed: No Parameters: N/A Treatment response/outcome: Twitch Response Elicited and Palpable Increase in Muscle Length Modalities: MHP x 10 min to neck in sitting (after session concluded, not included in treatment time).    PATIENT EDUCATION: Education details: HEP update 10/15/22 Person educated: Patient Education method: Explanation, Demonstration, Verbal cues, and Handouts Education comprehension: verbalized understanding and returned demonstration  HOME EXERCISE PROGRAM: Access Code: [email or web address] URL:  [email or web address] Date: 10/15/2022 Prepared by: Glenetta Hew  Exercises Added   - Shoulder extension with resistance - Neutral  - 1 x daily - 7 x weekly - 2 sets - 10 reps - Scapular Retraction with Resistance  - 1 x daily - 7 x weekly - 2 sets - 10 reps - Standing Shoulder External Rotation with Resistance  - 1 x daily - 7 x weekly - 2 sets - 10 reps - Seated Shoulder Flexion Towel Slide at Table Top  - 1 x daily - 7 x weekly - 2 sets - 10 reps  ASSESSMENT:  CLINICAL IMPRESSION: Brenda Mcfarland reports intermittent muscle spasm in UT, but is demonstrating better shoulder movements now, able to perform shoulder circles so told to discontinue shoulder shrugs.  She reported reproduction of symptoms today with STM to anterior scales, SCM and 1st rib mobs.  MHP applied to R shoulder at end of session for relaxation (not included in treatment time).   Brenda Mcfarland continues to demonstrate potential for improvement and would benefit from continued skilled therapy to address impairments.      OBJECTIVE IMPAIRMENTS: decreased activity tolerance, decreased endurance, decreased mobility, decreased ROM, decreased strength, increased fascial restrictions, increased muscle spasms, impaired flexibility, impaired UE functional use, postural dysfunction, and pain.   ACTIVITY LIMITATIONS: carrying, lifting, sleeping, bed mobility, dressing, reach over head, and hygiene/grooming  PARTICIPATION LIMITATIONS:  sewing  PERSONAL FACTORS: Age, Fitness, Past/current experiences, and 3+ comorbidities: history neck pain/surgery, tinnitis, chronic LBP, recent COVID infection with brain fog, glaucoma, obesity, bil knee OA, T2DM, vertigo  are also affecting patient's functional outcome.   REHAB POTENTIAL: Good  CLINICAL DECISION MAKING: Evolving/moderate complexity  EVALUATION COMPLEXITY: Moderate   GOALS: Goals reviewed with patient? Yes  SHORT TERM GOALS: Target date: 10/14/2022    Patient will be independent with initial HEP.  Baseline: needs Goal status: IN PROGRESS 10/06/22- HEP given.  10/15/22- initial HEP modified   LONG TERM GOALS: Target date: 11/11/2022   Patient will be independent with advanced/ongoing HEP to improve outcomes and carryover.  Baseline:  Goal status: IN PROGRESS  2.  Patient will report 75% improvement in Right shoulder pain to improve QOL.  Baseline:  Goal status: IN PROGRESS  3. Patient to improve R shoulder AROM to [facility] without pain provocation to allow for increased ease of ADLs.  Baseline: see objective  Goal status: IN PROGRESS  4.  Patient will demonstrate improved functional UE strength as demonstrated by 5/5 R shoulder strength. Baseline: 4+/5 limited by pain Goal status: IN PROGRESS  5.  Patient will report <40% impairment on QuickDash to demonstrate improved functional ability.  Baseline: 47.7% Goal status: IN PROGRESS  PLAN:  PT FREQUENCY: 2x/week  PT DURATION: 6 weeks  PLANNED INTERVENTIONS: Therapeutic exercises, Therapeutic activity, Neuromuscular re-education, Balance training, Gait training, Patient/Family education, Self Care, Joint mobilization, Dry Needling, Electrical stimulation, Spinal mobilization, Cryotherapy, Moist heat,  Taping, Traction, Ultrasound, Ionotophoresis 23m/ml Dexamethasone, Manual therapy, and Re-evaluation  PLAN FOR NEXT SESSION: postural strengthening, stretches as tolerated, manual therapy and TrDN, modalities PRN.    ERennie Natter PT, DPT  10/19/2022, 12:02 PM

## 2022-10-22 ENCOUNTER — Ambulatory Visit (INDEPENDENT_AMBULATORY_CARE_PROVIDER_SITE_OTHER): Payer: Medicare HMO | Admitting: Family Medicine

## 2022-10-22 ENCOUNTER — Encounter: Payer: Medicare HMO | Admitting: Physical Therapy

## 2022-10-22 ENCOUNTER — Ambulatory Visit (INDEPENDENT_AMBULATORY_CARE_PROVIDER_SITE_OTHER): Payer: Medicare HMO

## 2022-10-22 ENCOUNTER — Encounter: Payer: Self-pay | Admitting: Family Medicine

## 2022-10-22 ENCOUNTER — Ambulatory Visit: Payer: Self-pay

## 2022-10-22 VITALS — BP 122/76 | HR 90 | Ht 67.0 in | Wt 247.0 lb

## 2022-10-22 DIAGNOSIS — M25511 Pain in right shoulder: Secondary | ICD-10-CM | POA: Diagnosis not present

## 2022-10-22 DIAGNOSIS — G8929 Other chronic pain: Secondary | ICD-10-CM

## 2022-10-22 DIAGNOSIS — M5412 Radiculopathy, cervical region: Secondary | ICD-10-CM

## 2022-10-22 DIAGNOSIS — M5416 Radiculopathy, lumbar region: Secondary | ICD-10-CM | POA: Diagnosis not present

## 2022-10-22 DIAGNOSIS — M4326 Fusion of spine, lumbar region: Secondary | ICD-10-CM | POA: Diagnosis not present

## 2022-10-22 MED ORDER — GABAPENTIN 100 MG PO CAPS: 100.0000 mg | ORAL_CAPSULE | Freq: Every evening | ORAL | 3 refills | Status: DC | PRN

## 2022-10-22 MED ORDER — PREDNISONE 50 MG PO TABS: 50.0000 mg | ORAL_TABLET | Freq: Every day | ORAL | 0 refills | Status: DC

## 2022-10-22 NOTE — Patient Instructions (Addendum)
Thank you for coming in today.   Please get an Xray today before you leave   I think you may have a pinched nerve in your neck.   I will ask PT to work on that too.   Take the gabapentin at bedtime if you need to for nerve pain.   If this get worse ok to take that prednisone but let me know.   Let me know if you are not better.

## 2022-10-22 NOTE — Progress Notes (Signed)
I, [name], CMA acting as a scribe for Lynne Leader, MD.  Brenda Mcfarland is a 77 y.o. female who presents to [facility] today for 8-week follow-up chronic right shoulder, right trapezius, and right sided neck pain.  Pt hx of a cervical fusion 7 years ago. Patient was last seen by Dr. Georgina Snell on 09/11/2022 and was given a right subacromial steroid injection and advised to use a heating pad, TENS unit, methocarbamol, and was referred to PT, completing 5 visits.  Today, patient reports compliance with PT for therapy, u/s, and dry-needling; also had massage with PT. Notes less pain, can move the shoulder more without as much pain, ROM has improved. Muscular pain has improved. DuTerra Deep Blue and Heat for sx. Methocarbamol caused Vertigo to flare up. Not using TENs, doesn't like newer unit, wants more steady pulse.    She notes pain radiating down her right arm to her second and third digits of her hand.  This is associated with neck motion.  No weakness or numbness.  Dx imaging: 04/30/2020 R shoulder x-ray (done at [facility])   Pertinent review of systems: No fevers or chills  Relevant historical information: Diabetes.   Exam:  BP 122/76   Pulse 90   Ht 5' 7"$  (1.702 m)   Wt 247 lb (112 kg)   SpO2 94%   BMI 38.69 kg/m  General: Well Developed, well nourished, and in no acute distress.   MSK: C-spine: Normal. Nontender palpation cervical midline. Normal cervical motion. Impression strength decreased shoulder abduction otherwise upper extremity strength is intact.    Lab and Radiology Results  X-ray images C-spine obtained today personally and independently interpreted Anterior cervical fusion present at C3-4-5.  DDD below the level of the fusion.  No acute fractures are visible. Await formal radiology read   Assessment and Plan: 77 y.o. female with right shoulder pain and right arm pain.  Shoulder pain is thought to be due to shoulder and joint  and is improving with physical therapy and subacromial injection.  She still has more physical therapy scheduled but overall is improving.  She has some intermittent right cervical radiculopathy at C7 dermatomal pattern.  Plan to add this onto physical therapy.  I have prescribed gabapentin and prednisone to use as needed if symptoms worsen.  If needed in the future MRI or CT myelogram may be helpful.   PDMP not reviewed this encounter. Orders Placed This Encounter  Procedures   DG Cervical Spine 2 or 3 views    Standing Status:   Future    Number of Occurrences:   1    Standing Expiration Date:   10/23/2023    Order Specific Question:   Reason for Exam (SYMPTOM  OR DIAGNOSIS REQUIRED)    Answer:   eval cervical spine radiculopathy    Order Specific Question:   Preferred imaging location?    Answer:   [facility]   Ambulatory referral to Physical Therapy    Referral Priority:   Routine    Referral Type:   Physical Medicine    Referral Reason:   Specialty Services Required    Requested Specialty:   Physical Therapy    Number of Visits Requested:   1   Meds ordered this encounter  Medications   gabapentin (NEURONTIN) 100 MG capsule    Sig: Take 1-3 capsules (100-300 mg total) by mouth at bedtime as needed (nerve pain).    Dispense:  60 capsule  Refill:  3   predniSONE (DELTASONE) 50 MG tablet    Sig: Take 1 tablet (50 mg total) by mouth daily. Take if the nerve pain worsens    Dispense:  5 tablet    Refill:  0     Discussed warning signs or symptoms. Please see discharge instructions. Patient expresses understanding.   The above documentation has been reviewed and is accurate and complete Lynne Leader, M.D.

## 2022-10-26 ENCOUNTER — Encounter: Payer: Self-pay | Admitting: Physical Therapy

## 2022-10-26 ENCOUNTER — Ambulatory Visit: Payer: Medicare HMO | Admitting: Physical Therapy

## 2022-10-26 DIAGNOSIS — M6281 Muscle weakness (generalized): Secondary | ICD-10-CM | POA: Diagnosis not present

## 2022-10-26 DIAGNOSIS — M25511 Pain in right shoulder: Secondary | ICD-10-CM | POA: Diagnosis not present

## 2022-10-26 DIAGNOSIS — R293 Abnormal posture: Secondary | ICD-10-CM

## 2022-10-26 DIAGNOSIS — R252 Cramp and spasm: Secondary | ICD-10-CM | POA: Diagnosis not present

## 2022-10-26 NOTE — Progress Notes (Signed)
Cervical spine x-ray shows intact cervical fusion hardware from C3-C6 there is some arthritis changes around the surgery.

## 2022-10-26 NOTE — Therapy (Signed)
OUTPATIENT PHYSICAL THERAPY Treatment   Patient Name: Lexiana Bi MRN: [id number] DOB:Jan 11, 1946, 77 y.o., female Today's Date: 10/26/2022  END OF SESSION:  PT End of Session - 10/26/22 1019     Visit Number 6    Number of Visits 12    Date for PT Re-Evaluation 11/11/22    Authorization Type [employer] MCR + State BCBS    Progress Note Due on Visit 10    PT Start Time 1016    PT Stop Time 1104    PT Time Calculation (min) 48 min    Activity Tolerance Patient tolerated treatment well    Behavior During Therapy WFL for tasks assessed/performed             Past Medical History:  Diagnosis Date   Abnormal EKG 10/23/2016   Arthritis 01/18/2020   Chronic fatigue 10/08/2015   Depression 01/18/2020   Diabetes mellitus ([place]) 10/08/2015   Formatting of this note might be different from the original. Overview:  last Hgb A1C 7.2/no blood sugars at home Formatting of this note might be different from the original. last Hgb A1C 7.2/no blood sugars at home   ESR raised 11/27/2016   Frequent urination 12/16/2018   Glaucoma 10/08/2015   High cholesterol    History of pancreatitis 07/11/2014   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/23/2016   Hypertension 10/08/2015   IFG (impaired fasting glucose) 10/24/2019   Incomplete emptying of bladder 11/30/2017   Knee pain, left 12/22/2016   Loss of memory 01/18/2020   Major depressive disorder in partial remission ([place]) 10/08/2015   Morbid obesity ([place]) 10/23/2016   Obesity (BMI 30-39.9) 11/10/2016   Obstructive sleep apnea 12/16/2018   Osteoarthritis of both knees 11/27/2016   Other insomnia 03/05/2017   Palpitations 10/23/2016   Positive ANA (antinuclear antibody) 10/23/2016   Sleep apnea 10/08/2015   Formatting of this note might be different from the original. Overview:  does not use CPAP Formatting of this note might be different from the original. does not use CPAP   Sore neck 10/24/2019   Spondylosis of cervical spine with myelopathy 01/18/2020   Type 2 diabetes mellitus without  complication, without long-term current use of insulin ([place]) 12/16/2018   Vitamin D deficiency 10/08/2015   Past Surgical History:  Procedure Laterality Date   APPENDECTOMY     age 57   CESAREAN SECTION     X [place]  2015   SPINE SURGERY  05/29/2015   Patient Active Problem List   Diagnosis Date Noted   Nocturia 05/15/2022   Paroxysmal SVT (supraventricular tachycardia) 01/22/2020   Arthritis 01/18/2020   Depression 01/18/2020   Loss of memory 01/18/2020   Spondylosis of cervical spine with myelopathy 01/18/2020   IFG (impaired fasting glucose) 10/24/2019   Frequent urination 12/16/2018   Obstructive sleep apnea 12/16/2018   Type 2 diabetes mellitus without complication, without long-term current use of insulin ([place]) 12/16/2018   Incomplete emptying of bladder 11/30/2017   Insomnia 03/05/2017   Knee pain, left 12/22/2016   ESR raised 11/27/2016   Osteoarthritis of both knees 11/27/2016   Obesity (BMI 30-39.9) 11/10/2016   Abnormal EKG 10/23/2016   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/23/2016   Morbid obesity ([place]) 10/23/2016   Palpitations 10/23/2016   Positive ANA (antinuclear antibody) 10/23/2016   Hypertension 10/08/2015   Chronic fatigue 10/08/2015   Diabetes mellitus ([place]) 10/08/2015   Glaucoma 10/08/2015   Major depressive disorder in partial remission ([place]) 10/08/2015   Vitamin D deficiency 10/08/2015   Sleep  apnea 10/08/2015   History of pancreatitis 07/11/2014    PCP: Lauree Chandler, NP  REFERRING PROVIDER: Gregor Hams, MD   REFERRING DIAG: [phone] (ICD-10-CM) - Chronic right shoulder pain M62.838 (ICD-10-CM) - Trapezius muscle spasm  THERAPY DIAG:  Acute pain of right shoulder  Abnormal posture  Cramp and spasm  Muscle weakness (generalized)  Rationale for Evaluation and Treatment: Rehabilitation  ONSET DATE: 08/13/23  SUBJECTIVE:                                                                                                                                                                                       SUBJECTIVE STATEMENT: Elvera Bicker felt like it was getting better but had shooting pain yesterday, thinks it was the theraband exercises.  Saw Dr. Georgina Snell on Thursday who wants to add cervical radiculopathy to treatment plan.   NEXT MD VISIT:  10/22/2022  PERTINENT HISTORY: history neck pain/surgery, tinnitis, chronic LBP, recent COVID infection with brain fog, glaucoma, obesity, bil knee OA, T2DM, vertigo  PAIN:  Are you having pain? Yes: NPRS scale: 3/10 Pain location: neck to R arm  Pain description: radiates down to elbow, shooting, throbbing Aggravating factors: moving arm, raising overhead, putting on bra Relieving factors: keeping it still, pain creme, heat   PRECAUTIONS: None  WEIGHT BEARING RESTRICTIONS: No  FALLS:  Has patient fallen in last 6 months? Yes. Number of falls 1 going up stairs, tripped  LIVING ENVIRONMENT: Lives with: lives alone Lives in: House/apartment Stairs: Yes: Internal: 2 steps; on right going up Has following equipment at home: None  OCCUPATION: Retired   PLOF: Independent  PATIENT GOALS:decrease shoulder pain  NEXT MD VISIT:  10/22/2022  OBJECTIVE:   DIAGNOSTIC FINDINGS:  09/11/2022 Limited diagnostic ultrasound: Very large subacromial and subdeltoid bursitis are present. Rotator cuff tendons appear to be intact without large rotator cuff retracted tear. Accuracy of ultrasound limited by body habitus  10/22/22  DG cervical spine. FINDINGS: The cervical spine is only well visualized to the bottom of C6 on lateral imaging. Anterior plate and screw fusion from C3 through C6 remains. Hardware is in good position. No malalignment. Pre odontoid space and prevertebral soft tissues are normal. Mild degenerative disc disease at C2-3. Uncovertebral degenerative changes identified, right greater than left. No other bony or soft tissue abnormalities are noted.  PATIENT SURVEYS:   Quick Dash 47.7%  COGNITION: Overall cognitive status: Within functional limits for tasks assessed     SENSATION: WFL reports occasional numbness radiating to palmar surface R hand   POSTURE: Forward head, rounded shoulders  CERVICAL ROM:  AROM eval  Cervical Flexion 15  Cervical Extension 20  Cervical Rotation to Left 30  Cervical Rotation to Right 45  Left Sidebend 15  Right Sidebend 10    UPPER EXTREMITY ROM:   Active ROM Right eval Left eval  Shoulder flexion 85 125  Shoulder extension 55 65  Shoulder abduction 85 115  Shoulder adduction    Shoulder internal rotation 60 70  Shoulder external rotation 50 65  (Blank rows = not tested)  UPPER EXTREMITY MMT:  MMT Right eval Left eval  Shoulder flexion 4+ 5  Shoulder extension    Shoulder abduction 4+ 5  Shoulder adduction    Shoulder internal rotation 4+ 5  Shoulder external rotation 4+ 5  Elbow flexion 5 5  Elbow extension 5 5  Wrist flexion 5 5  Wrist extension 5 5  Grip strength (lbs) good good  (Blank rows = not tested)  SHOULDER SPECIAL TESTS: NT  JOINT MOBILITY TESTING:  NT  PALPATION:  Tightness R upper trap, R levator scapulae, cervical paraspinals, R deltoid and bicips   TODAY'S TREATMENT:                                                                                                                                         DATE:   10/26/22 Therapeutic Exercise: to improve strength and mobility.  Demo, verbal and tactile cues throughout for technique. UBE x 6 min forward Shoulder circles Nerve glide - flexing/extending wrist and neck as tolerated.  Manual Therapy: to decrease muscle spasm and pain and improve mobility STM/TPR to cervical paraspinals, R UT, R L/S, R SCM, anterior scalenes,  R 1st rib mobs, skilled palpation and monitoring during dry needling. Trigger Point Dry-Needling  Treatment instructions: Expect mild to moderate muscle soreness. S/S of pneumothorax if dry needled  over a lung field, and to seek immediate medical attention should they occur. Patient verbalized understanding of these instructions and education. Patient Consent Given: Yes Education handout provided: Previously provided Muscles treated: R cervical multifidi C5-7, R UT, R L/S Electrical stimulation performed: No Parameters: N/A Treatment response/outcome: Twitch Response Elicited and Palpable Increase in Muscle Length   10/19/22 Therapeutic Exercise: to improve strength and mobility.  Demo, verbal and tactile cues throughout for technique. UBE x 6 min ([id number]) Table slides x 10 Shoulder rolls retro x 20 Chin tucks x 20  Manual Therapy: to decrease muscle spasm and pain and improve mobility STM/TPR to cervical paraspinals, R UT, R L/S, R SCM, anterior scalenes,  R 1st rib mobs, IASTM to R deltoids.   Pec stretch with gentle overpressure.    10/15/22 Therapeutic Exercise: to improve strength and mobility.  Demo, verbal and tactile cues throughout for technique. Chin tucks, shoulder rolls. Seated table slides scaption x 20 Standing rows YTB x 20 Standing shoulder extension YTB x 20 Standing shoulder bil ER YTB x 20 Manual Therapy: to decrease muscle spasm and pain and improve mobility.  STM/TPR to R  UT, cervical paraspinals, levator scapuale, suboccipitals, , skilled palpation and monitoring during dry needling. Trigger Point Dry-Needling  Treatment instructions: Expect mild to moderate muscle soreness. S/S of pneumothorax if dry needled over a lung field, and to seek immediate medical attention should they occur. Patient verbalized understanding of these instructions and education. Patient Consent Given: Yes Education handout provided: Yes Muscles treated: R UT, R L/S Electrical stimulation performed: No Parameters: N/A Treatment response/outcome: Twitch Response Elicited and Palpable Increase in Muscle Length   PATIENT EDUCATION: Education details: HEP update 10/15/22 Person  educated: Patient Education method: Explanation, Demonstration, Verbal cues, and Handouts Education comprehension: verbalized understanding and returned demonstration  HOME EXERCISE PROGRAM: Access Code: [email or web address] URL: [email or web address] Date: 10/15/2022 Prepared by: Glenetta Hew  Exercises Added   - Shoulder extension with resistance - Neutral  - 1 x daily - 7 x weekly - 2 sets - 10 reps - Scapular Retraction with Resistance  - 1 x daily - 7 x weekly - 2 sets - 10 reps - Standing Shoulder External Rotation with Resistance  - 1 x daily - 7 x weekly - 2 sets - 10 reps - Seated Shoulder Flexion Towel Slide at Table Top  - 1 x daily - 7 x weekly - 2 sets - 10 reps  ASSESSMENT:  CLINICAL IMPRESSION: Sotheary Grodi still reports significant irritability and difficulty with all exercises.  She continues to demonstrate tightness throughout cervical musculature, and palpation of anterior scalenes increases radicular symptoms.  Reassured that we had been already including symptoms from neck into treatment plan.  After reviewing imaging and verifying she had ACDF (she thought it was posterior), trialed dry needling cervical multifidi as well as UT and L/S, but noted she reported more pain with twitch response today.   Anova Quirke continues to demonstrate potential for improvement and would benefit from continued skilled therapy to address impairments.      OBJECTIVE IMPAIRMENTS: decreased activity tolerance, decreased endurance, decreased mobility, decreased ROM, decreased strength, increased fascial restrictions, increased muscle spasms, impaired flexibility, impaired UE functional use, postural dysfunction, and pain.   ACTIVITY LIMITATIONS: carrying, lifting, sleeping, bed mobility, dressing, reach over head, and hygiene/grooming  PARTICIPATION LIMITATIONS:  sewing  PERSONAL FACTORS: Age, Fitness, Past/current experiences, and 3+ comorbidities: history neck pain/surgery,  tinnitis, chronic LBP, recent COVID infection with brain fog, glaucoma, obesity, bil knee OA, T2DM, vertigo  are also affecting patient's functional outcome.   REHAB POTENTIAL: Good  CLINICAL DECISION MAKING: Evolving/moderate complexity  EVALUATION COMPLEXITY: Moderate   GOALS: Goals reviewed with patient? Yes  SHORT TERM GOALS: Target date: 10/14/2022   Patient will be independent with initial HEP.  Baseline: needs Goal status: IN PROGRESS 10/06/22- HEP given.  10/15/22- initial HEP modified   LONG TERM GOALS: Target date: 11/11/2022   Patient will be independent with advanced/ongoing HEP to improve outcomes and carryover.  Baseline:  Goal status: IN PROGRESS  2.  Patient will report 75% improvement in Right shoulder pain to improve QOL.  Baseline:  Goal status: IN PROGRESS  3. Patient to improve R shoulder AROM to [facility] without pain provocation to allow for increased ease of ADLs.  Baseline: see objective  Goal status: IN PROGRESS  4.  Patient will demonstrate improved functional UE strength as demonstrated by 5/5 R shoulder strength. Baseline: 4+/5 limited by pain Goal status: IN PROGRESS  5.  Patient will report <40% impairment on QuickDash to demonstrate improved functional ability.  Baseline: 47.7% Goal status: IN PROGRESS  PLAN:  PT FREQUENCY:  2x/week  PT DURATION: 6 weeks  PLANNED INTERVENTIONS: Therapeutic exercises, Therapeutic activity, Neuromuscular re-education, Balance training, Gait training, Patient/Family education, Self Care, Joint mobilization, Dry Needling, Electrical stimulation, Spinal mobilization, Cryotherapy, Moist heat, Taping, Traction, Ultrasound, Ionotophoresis '4mg'$ /ml Dexamethasone, Manual therapy, and Re-evaluation  PLAN FOR NEXT SESSION: postural strengthening, stretches as tolerated, manual therapy and TrDN, modalities PRN.  Add nerve glides as tolerated to HEP.    Rennie Natter, PT, DPT  10/26/2022, 12:02 PM

## 2022-10-29 ENCOUNTER — Ambulatory Visit: Payer: Medicare HMO | Admitting: Physical Therapy

## 2022-10-29 ENCOUNTER — Encounter: Payer: Self-pay | Admitting: Physical Therapy

## 2022-10-29 DIAGNOSIS — M25511 Pain in right shoulder: Secondary | ICD-10-CM

## 2022-10-29 DIAGNOSIS — R252 Cramp and spasm: Secondary | ICD-10-CM

## 2022-10-29 DIAGNOSIS — R293 Abnormal posture: Secondary | ICD-10-CM

## 2022-10-29 DIAGNOSIS — M6281 Muscle weakness (generalized): Secondary | ICD-10-CM

## 2022-10-29 NOTE — Therapy (Signed)
OUTPATIENT PHYSICAL THERAPY Treatment   Patient Name: Brenda Mcfarland MRN: [id number] DOB:April 13, 1946, 77 y.o., female Today's Date: 10/29/2022  END OF SESSION:  PT End of Session - 10/29/22 1055     Visit Number 7    Number of Visits 12    Date for PT Re-Evaluation 11/11/22    Authorization Type [employer] MCR + State BCBS    Progress Note Due on Visit 10    PT Start Time 1055    PT Stop Time 1154    PT Time Calculation (min) 59 min    Activity Tolerance Patient tolerated treatment well    Behavior During Therapy WFL for tasks assessed/performed             Past Medical History:  Diagnosis Date   Abnormal EKG 10/23/2016   Arthritis 01/18/2020   Chronic fatigue 10/08/2015   Depression 01/18/2020   Diabetes mellitus ([place]) 10/08/2015   Formatting of this note might be different from the original. Overview:  last Hgb A1C 7.2/no blood sugars at home Formatting of this note might be different from the original. last Hgb A1C 7.2/no blood sugars at home   ESR raised 11/27/2016   Frequent urination 12/16/2018   Glaucoma 10/08/2015   High cholesterol    History of pancreatitis 07/11/2014   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/23/2016   Hypertension 10/08/2015   IFG (impaired fasting glucose) 10/24/2019   Incomplete emptying of bladder 11/30/2017   Knee pain, left 12/22/2016   Loss of memory 01/18/2020   Major depressive disorder in partial remission ([place]) 10/08/2015   Morbid obesity ([place]) 10/23/2016   Obesity (BMI 30-39.9) 11/10/2016   Obstructive sleep apnea 12/16/2018   Osteoarthritis of both knees 11/27/2016   Other insomnia 03/05/2017   Palpitations 10/23/2016   Positive ANA (antinuclear antibody) 10/23/2016   Sleep apnea 10/08/2015   Formatting of this note might be different from the original. Overview:  does not use CPAP Formatting of this note might be different from the original. does not use CPAP   Sore neck 10/24/2019   Spondylosis of cervical spine with myelopathy 01/18/2020   Type 2 diabetes mellitus without  complication, without long-term current use of insulin ([place]) 12/16/2018   Vitamin D deficiency 10/08/2015   Past Surgical History:  Procedure Laterality Date   APPENDECTOMY     age 65   CESAREAN SECTION     X [place]  2015   SPINE SURGERY  05/29/2015   Patient Active Problem List   Diagnosis Date Noted   Nocturia 05/15/2022   Paroxysmal SVT (supraventricular tachycardia) 01/22/2020   Arthritis 01/18/2020   Depression 01/18/2020   Loss of memory 01/18/2020   Spondylosis of cervical spine with myelopathy 01/18/2020   IFG (impaired fasting glucose) 10/24/2019   Frequent urination 12/16/2018   Obstructive sleep apnea 12/16/2018   Type 2 diabetes mellitus without complication, without long-term current use of insulin ([place]) 12/16/2018   Incomplete emptying of bladder 11/30/2017   Insomnia 03/05/2017   Knee pain, left 12/22/2016   ESR raised 11/27/2016   Osteoarthritis of both knees 11/27/2016   Obesity (BMI 30-39.9) 11/10/2016   Abnormal EKG 10/23/2016   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/23/2016   Morbid obesity ([place]) 10/23/2016   Palpitations 10/23/2016   Positive ANA (antinuclear antibody) 10/23/2016   Hypertension 10/08/2015   Chronic fatigue 10/08/2015   Diabetes mellitus ([place]) 10/08/2015   Glaucoma 10/08/2015   Major depressive disorder in partial remission ([place]) 10/08/2015   Vitamin D deficiency 10/08/2015   Sleep  apnea 10/08/2015   History of pancreatitis 07/11/2014    PCP: Lauree Chandler, NP  REFERRING PROVIDER: Gregor Hams, MD   REFERRING DIAG: [phone] (ICD-10-CM) - Chronic right shoulder pain M62.838 (ICD-10-CM) - Trapezius muscle spasm  THERAPY DIAG:  Acute pain of right shoulder  Abnormal posture  Cramp and spasm  Muscle weakness (generalized)  Rationale for Evaluation and Treatment: Rehabilitation  ONSET DATE: 08/13/23  SUBJECTIVE:                                                                                                                                                                                       SUBJECTIVE STATEMENT: Brenda Mcfarland drove herself here today, hasn't had vertigo in a few days.  Shoulder feels better after therapy but then goes back to where it was.  Still getting the popping.  Neck is better too.    NEXT MD VISIT:  10/22/2022  PERTINENT HISTORY: history neck pain/surgery, tinnitis, chronic LBP, recent COVID infection with brain fog, glaucoma, obesity, bil knee OA, T2DM, vertigo  PAIN:  Are you having pain? Yes: NPRS scale: 3/10 Pain location: neck to R arm  Pain description: radiates down to elbow, shooting, throbbing Aggravating factors: moving arm, raising overhead, putting on bra Relieving factors: keeping it still, pain creme, heat   PRECAUTIONS: None  WEIGHT BEARING RESTRICTIONS: No  FALLS:  Has patient fallen in last 6 months? Yes. Number of falls 1 going up stairs, tripped  LIVING ENVIRONMENT: Lives with: lives alone Lives in: House/apartment Stairs: Yes: Internal: 2 steps; on right going up Has following equipment at home: None  OCCUPATION: Retired   PLOF: Independent  PATIENT GOALS:decrease shoulder pain  NEXT MD VISIT:  10/22/2022  OBJECTIVE:   DIAGNOSTIC FINDINGS:  09/11/2022 Limited diagnostic ultrasound: Very large subacromial and subdeltoid bursitis are present. Rotator cuff tendons appear to be intact without large rotator cuff retracted tear. Accuracy of ultrasound limited by body habitus  10/22/22  DG cervical spine. FINDINGS: The cervical spine is only well visualized to the bottom of C6 on lateral imaging. Anterior plate and screw fusion from C3 through C6 remains. Hardware is in good position. No malalignment. Pre odontoid space and prevertebral soft tissues are normal. Mild degenerative disc disease at C2-3. Uncovertebral degenerative changes identified, right greater than left. No other bony or soft tissue abnormalities are noted.  PATIENT SURVEYS:  Quick  Dash 47.7%  COGNITION: Overall cognitive status: Within functional limits for tasks assessed     SENSATION: WFL reports occasional numbness radiating to palmar surface R hand   POSTURE: Forward head, rounded shoulders  CERVICAL ROM:  AROM eval  Cervical Flexion 15  Cervical Extension 20  Cervical Rotation to Left 30  Cervical Rotation to Right 45  Left Sidebend 15  Right Sidebend 10    UPPER EXTREMITY ROM:   Active ROM Right eval Left eval  Shoulder flexion 85 125  Shoulder extension 55 65  Shoulder abduction 85 115  Shoulder adduction    Shoulder internal rotation 60 70  Shoulder external rotation 50 65  (Blank rows = not tested)  UPPER EXTREMITY MMT:  MMT Right eval Left eval  Shoulder flexion 4+ 5  Shoulder extension    Shoulder abduction 4+ 5  Shoulder adduction    Shoulder internal rotation 4+ 5  Shoulder external rotation 4+ 5  Elbow flexion 5 5  Elbow extension 5 5  Wrist flexion 5 5  Wrist extension 5 5  Grip strength (lbs) good good  (Blank rows = not tested)  SHOULDER SPECIAL TESTS: NT  JOINT MOBILITY TESTING:  NT  PALPATION:  Tightness R upper trap, R levator scapulae, cervical paraspinals, R deltoid and bicips   TODAY'S TREATMENT:                                                                                                                                         DATE:   10/29/22 Therapeutic Exercise: to improve strength and mobility.  Demo, verbal and tactile cues throughout for technique. Bike L3 x 5 min  Table slides flexion and scaption x 15 Shoulder isometrics using pillow for resistance - shoulder abduction, ER, IR, extension, adduction 10 x 3 sec hold Review of HEP - updated isometrics with instructions on how to perform as done in clinic today.  Manual Therapy: to decrease muscle spasm and pain and improve mobility STM/TPR to cervical paraspinals, R UT, R L/S, R suboccipitals, skilled palpation and monitoring during dry  needling. Trigger Point Dry-Needling  Treatment instructions: Expect mild to moderate muscle soreness. S/S of pneumothorax if dry needled over a lung field, and to seek immediate medical attention should they occur. Patient verbalized understanding of these instructions and education. Patient Consent Given: Yes Education handout provided: Previously provided Muscles treated: R cervical multifidi C5-7, R UT, R L/S Electrical stimulation performed: No Parameters: N/A Treatment response/outcome: Twitch Response Elicited and Palpable Increase in Muscle Length  10/26/22 Therapeutic Exercise: to improve strength and mobility.  Demo, verbal and tactile cues throughout for technique. UBE x 6 min forward Shoulder circles Nerve glide - flexing/extending wrist and neck as tolerated.  Manual Therapy: to decrease muscle spasm and pain and improve mobility STM/TPR to cervical paraspinals, R UT, R L/S, R SCM, anterior scalenes,  R 1st rib mobs, skilled palpation and monitoring during dry needling. Trigger Point Dry-Needling  Treatment instructions: Expect mild to moderate muscle soreness. S/S of pneumothorax if dry needled over a lung field, and to seek immediate medical attention should they occur. Patient verbalized understanding of these instructions and education. Patient Consent  Given: Yes Education handout provided: Previously provided Muscles treated: R cervical multifidi C4-7, R UT, R L/S Electrical stimulation performed: No Parameters: N/A Treatment response/outcome: Twitch Response Elicited and Palpable Increase in Muscle Length   10/19/22 Therapeutic Exercise: to improve strength and mobility.  Demo, verbal and tactile cues throughout for technique. UBE x 6 min ([id number]) Table slides x 10 Shoulder rolls retro x 20 Chin tucks x 20  Manual Therapy: to decrease muscle spasm and pain and improve mobility STM/TPR to cervical paraspinals, R UT, R L/S, R SCM, anterior scalenes,  R 1st rib mobs,  IASTM to R deltoids.   Pec stretch with gentle overpressure.    PATIENT EDUCATION: Education details: HEP update 10/15/22 Person educated: Patient Education method: Explanation, Demonstration, Verbal cues, and Handouts Education comprehension: verbalized understanding and returned demonstration  HOME EXERCISE PROGRAM: Access Code: [email or web address]: [email or web address] Date: 10/29/2022 Prepared by: EGlenetta Hew Exercises - Seated Scapular Retraction  - 3 x daily - 7 x weekly - 1 sets - 5 reps - 5 sec hold - Standing Isometric Shoulder Internal Rotation at Doorway  - 3 x daily - 7 x weekly - 1 sets - 5 reps - 5 sec  hold - Isometric Shoulder External Rotation at Wall  - 3 x daily - 7 x weekly - 1 sets - 5 reps - 5 sec  hold - Isometric Shoulder Extension at Wall  - 3 x daily - 7 x weekly - 1 sets - 5 reps - 5 sec hold - Isometric Shoulder Adduction  - 1 x daily - 7 x weekly - 3 sets - 10 reps - Seated Shoulder Flexion Towel Slide at Table Top  - 1 x daily - 7 x weekly - 2 sets - 10 reps - Seated Shoulder Towel Slides Scaption at Table  - 1 x daily - 7 x weekly - 3 sets - 10 reps - Gentle Levator Scapulae Stretch  - 3 x daily - 7 x weekly - 1 sets - 3 reps - 15 sec  hold - Shoulder extension with resistance - Neutral  - 1 x daily - 7 x weekly - 2 sets - 10 reps - Scapular Retraction with Resistance  - 1 x daily - 7 x weekly - 2 sets - 10 reps  ASSESSMENT:  CLINICAL IMPRESSION: CKhrystina Alsbrookreported frustration today as still having shoulder pain and while manual therapy helps pain is not having lasting benefit.  We had a discussion today on pain neuroscience and importance of differentiating between discomfort and actual pain, and tolerating a little discomfort with exercises to start strengthening RTC and seeing more improvement.  Also reviewed isometrics focusing on how she could do sitting at couch at arm to avoid causing any wrist pain.  She tolerated exercises well  today.  Manual therapy today continued focus on suboccipitals and cervical paraspinals, as she has noticed improvement in vertigo symptoms over last couple days as well.   CRaheel Mcfarland to demonstrate potential for improvement and would benefit from continued skilled therapy to address impairments.      OBJECTIVE IMPAIRMENTS: decreased activity tolerance, decreased endurance, decreased mobility, decreased ROM, decreased strength, increased fascial restrictions, increased muscle spasms, impaired flexibility, impaired UE functional use, postural dysfunction, and pain.   ACTIVITY LIMITATIONS: carrying, lifting, sleeping, bed mobility, dressing, reach over head, and hygiene/grooming  PARTICIPATION LIMITATIONS:  sewing  PERSONAL FACTORS: Age, Fitness, Past/current experiences, and 3+ comorbidities: history neck pain/surgery, tinnitis, chronic LBP, recent COVID infection with brain  fog, glaucoma, obesity, bil knee OA, T2DM, vertigo  are also affecting patient's functional outcome.   REHAB POTENTIAL: Good  CLINICAL DECISION MAKING: Evolving/moderate complexity  EVALUATION COMPLEXITY: Moderate   GOALS: Goals reviewed with patient? Yes  SHORT TERM GOALS: Target date: 10/14/2022   Patient will be independent with initial HEP.  Baseline: needs Goal status: IN PROGRESS 10/06/22- HEP given.  10/15/22- initial HEP modified   LONG TERM GOALS: Target date: 11/11/2022   Patient will be independent with advanced/ongoing HEP to improve outcomes and carryover.  Baseline:  Goal status: IN PROGRESS  2.  Patient will report 75% improvement in Right shoulder pain to improve QOL.  Baseline:  Goal status: IN PROGRESS  3. Patient to improve R shoulder AROM to [facility] without pain provocation to allow for increased ease of ADLs.  Baseline: see objective  Goal status: IN PROGRESS  4.  Patient will demonstrate improved functional UE strength as demonstrated by 5/5 R shoulder strength. Baseline: 4+/5  limited by pain Goal status: IN PROGRESS  5.  Patient will report <40% impairment on QuickDash to demonstrate improved functional ability.  Baseline: 47.7% Goal status: IN PROGRESS  PLAN:  PT FREQUENCY: 2x/week  PT DURATION: 6 weeks  PLANNED INTERVENTIONS: Therapeutic exercises, Therapeutic activity, Neuromuscular re-education, Balance training, Gait training, Patient/Family education, Self Care, Joint mobilization, Dry Needling, Electrical stimulation, Spinal mobilization, Cryotherapy, Moist heat, Taping, Traction, Ultrasound, Ionotophoresis '4mg'$ /ml Dexamethasone, Manual therapy, and Re-evaluation  PLAN FOR NEXT SESSION: postural strengthening, stretches as tolerated, manual therapy and TrDN, modalities PRN.  Add nerve glides as tolerated to HEP.    Rennie Natter, PT, DPT  10/29/2022, 12:10 PM

## 2022-11-02 ENCOUNTER — Ambulatory Visit: Payer: Medicare HMO | Attending: Family Medicine | Admitting: Physical Therapy

## 2022-11-02 ENCOUNTER — Encounter: Payer: Self-pay | Admitting: Nurse Practitioner

## 2022-11-02 DIAGNOSIS — R293 Abnormal posture: Secondary | ICD-10-CM | POA: Diagnosis not present

## 2022-11-02 DIAGNOSIS — R252 Cramp and spasm: Secondary | ICD-10-CM | POA: Diagnosis not present

## 2022-11-02 DIAGNOSIS — M6281 Muscle weakness (generalized): Secondary | ICD-10-CM | POA: Insufficient documentation

## 2022-11-02 DIAGNOSIS — M25511 Pain in right shoulder: Secondary | ICD-10-CM | POA: Insufficient documentation

## 2022-11-02 NOTE — Therapy (Signed)
OUTPATIENT PHYSICAL THERAPY Treatment   Patient Name: Brenda Mcfarland MRN: [id number] DOB:1945/11/05, 77 y.o., female Today's Date: 11/02/2022  END OF SESSION:  PT End of Session - 11/02/22 1105     Visit Number 8    Number of Visits 12    Date for PT Re-Evaluation 11/11/22    Authorization Type [employer] MCR + State BCBS    Progress Note Due on Visit 10    PT Start Time 1103    PT Stop Time 1146    PT Time Calculation (min) 43 min    Activity Tolerance Patient tolerated treatment well    Behavior During Therapy WFL for tasks assessed/performed             Past Medical History:  Diagnosis Date   Abnormal EKG 10/23/2016   Arthritis 01/18/2020   Chronic fatigue 10/08/2015   Depression 01/18/2020   Diabetes mellitus ([place]) 10/08/2015   Formatting of this note might be different from the original. Overview:  last Hgb A1C 7.2/no blood sugars at home Formatting of this note might be different from the original. last Hgb A1C 7.2/no blood sugars at home   ESR raised 11/27/2016   Frequent urination 12/16/2018   Glaucoma 10/08/2015   High cholesterol    History of pancreatitis 07/11/2014   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/23/2016   Hypertension 10/08/2015   IFG (impaired fasting glucose) 10/24/2019   Incomplete emptying of bladder 11/30/2017   Knee pain, left 12/22/2016   Loss of memory 01/18/2020   Major depressive disorder in partial remission ([place]) 10/08/2015   Morbid obesity ([place]) 10/23/2016   Obesity (BMI 30-39.9) 11/10/2016   Obstructive sleep apnea 12/16/2018   Osteoarthritis of both knees 11/27/2016   Other insomnia 03/05/2017   Palpitations 10/23/2016   Positive ANA (antinuclear antibody) 10/23/2016   Sleep apnea 10/08/2015   Formatting of this note might be different from the original. Overview:  does not use CPAP Formatting of this note might be different from the original. does not use CPAP   Sore neck 10/24/2019   Spondylosis of cervical spine with myelopathy 01/18/2020   Type 2 diabetes mellitus without  complication, without long-term current use of insulin ([place]) 12/16/2018   Vitamin D deficiency 10/08/2015   Past Surgical History:  Procedure Laterality Date   APPENDECTOMY     age 55   CESAREAN SECTION     X [place]  2015   SPINE SURGERY  05/29/2015   Patient Active Problem List   Diagnosis Date Noted   Nocturia 05/15/2022   Paroxysmal SVT (supraventricular tachycardia) 01/22/2020   Arthritis 01/18/2020   Depression 01/18/2020   Loss of memory 01/18/2020   Spondylosis of cervical spine with myelopathy 01/18/2020   IFG (impaired fasting glucose) 10/24/2019   Frequent urination 12/16/2018   Obstructive sleep apnea 12/16/2018   Type 2 diabetes mellitus without complication, without long-term current use of insulin ([place]) 12/16/2018   Incomplete emptying of bladder 11/30/2017   Insomnia 03/05/2017   Knee pain, left 12/22/2016   ESR raised 11/27/2016   Osteoarthritis of both knees 11/27/2016   Obesity (BMI 30-39.9) 11/10/2016   Abnormal EKG 10/23/2016   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/23/2016   Morbid obesity ([place]) 10/23/2016   Palpitations 10/23/2016   Positive ANA (antinuclear antibody) 10/23/2016   Hypertension 10/08/2015   Chronic fatigue 10/08/2015   Diabetes mellitus ([place]) 10/08/2015   Glaucoma 10/08/2015   Major depressive disorder in partial remission ([place]) 10/08/2015   Vitamin D deficiency 10/08/2015   Sleep  apnea 10/08/2015   History of pancreatitis 07/11/2014    PCP: Lauree Chandler, NP  REFERRING PROVIDER: Gregor Hams, MD   REFERRING DIAG: [phone] (ICD-10-CM) - Chronic right shoulder pain M62.838 (ICD-10-CM) - Trapezius muscle spasm  THERAPY DIAG:  Acute pain of right shoulder  Abnormal posture  Cramp and spasm  Muscle weakness (generalized)  Rationale for Evaluation and Treatment: Rehabilitation  ONSET DATE: 08/13/23  SUBJECTIVE:                                                                                                                                                                                       SUBJECTIVE STATEMENT: Brenda Mcfarland reports she was doing well, no pain, and then did the isometric squeezing arm against belly in bed and started the pain again last night to 8/10.  Today more sore 2-3/10.  Vertigo is a little bit better.    NEXT MD VISIT:  10/22/2022  PERTINENT HISTORY: history neck pain/surgery, tinnitis, chronic LBP, recent COVID infection with brain fog, glaucoma, obesity, bil knee OA, T2DM, vertigo  PAIN:  Are you having pain? Yes: NPRS scale: 2-3/10 Pain location: neck to R arm  Pain description: radiates down to elbow, shooting, throbbing Aggravating factors: moving arm, raising overhead, putting on bra Relieving factors: keeping it still, pain creme, heat   PRECAUTIONS: None  WEIGHT BEARING RESTRICTIONS: No  FALLS:  Has patient fallen in last 6 months? Yes. Number of falls 1 going up stairs, tripped  LIVING ENVIRONMENT: Lives with: lives alone Lives in: House/apartment Stairs: Yes: Internal: 2 steps; on right going up Has following equipment at home: None  OCCUPATION: Retired   PLOF: Independent  PATIENT GOALS:decrease shoulder pain  NEXT MD VISIT:  10/22/2022  OBJECTIVE:   DIAGNOSTIC FINDINGS:  09/11/2022 Limited diagnostic ultrasound: Very large subacromial and subdeltoid bursitis are present. Rotator cuff tendons appear to be intact without large rotator cuff retracted tear. Accuracy of ultrasound limited by body habitus  10/22/22  DG cervical spine. FINDINGS: The cervical spine is only well visualized to the bottom of C6 on lateral imaging. Anterior plate and screw fusion from C3 through C6 remains. Hardware is in good position. No malalignment. Pre odontoid space and prevertebral soft tissues are normal. Mild degenerative disc disease at C2-3. Uncovertebral degenerative changes identified, right greater than left. No other bony or soft tissue abnormalities are  noted.  PATIENT SURVEYS:  Quick Dash 47.7%  COGNITION: Overall cognitive status: Within functional limits for tasks assessed     SENSATION: WFL reports occasional numbness radiating to palmar surface R hand   POSTURE: Forward head, rounded shoulders  CERVICAL ROM:  AROM eval  Cervical Flexion 15  Cervical Extension 20  Cervical Rotation to Left 30  Cervical Rotation to Right 45  Left Sidebend 15  Right Sidebend 10    UPPER EXTREMITY ROM:   Active ROM Right eval Left eval  Shoulder flexion 85 125  Shoulder extension 55 65  Shoulder abduction 85 115  Shoulder adduction    Shoulder internal rotation 60 70  Shoulder external rotation 50 65  (Blank rows = not tested)  UPPER EXTREMITY MMT:  MMT Right eval Left eval  Shoulder flexion 4+ 5  Shoulder extension    Shoulder abduction 4+ 5  Shoulder adduction    Shoulder internal rotation 4+ 5  Shoulder external rotation 4+ 5  Elbow flexion 5 5  Elbow extension 5 5  Wrist flexion 5 5  Wrist extension 5 5  Grip strength (lbs) good good  (Blank rows = not tested)  SHOULDER SPECIAL TESTS: NT  JOINT MOBILITY TESTING:  NT  PALPATION:  Tightness R upper trap, R levator scapulae, cervical paraspinals, R deltoid and bicips   TODAY'S TREATMENT:                                                                                                                                         DATE:  11/02/2022 Therapeutic Exercise: to improve strength and mobility.  Demo, verbal and tactile cues throughout for technique. UBE forward L1 x 5 min  Cervical snag extension x 10, rotation x 10 bil  Review of exercises Manual Therapy: to decrease muscle spasm and pain and improve mobility STM/TPR to cervical paraspinals, R UT, R L/S, R suboccipitals, anterior scalenes, skilled palpation and monitoring during dry needling. Trigger Point Dry-Needling  Treatment instructions: Expect mild to moderate muscle soreness. S/S of pneumothorax if  dry needled over a lung field, and to seek immediate medical attention should they occur. Patient verbalized understanding of these instructions and education. Patient Consent Given: Yes Education handout provided: Previously provided Muscles treated: R cervical multifidi C5-7, R UT, R L/S Electrical stimulation performed: No Parameters: N/A Treatment response/outcome: Twitch Response Elicited and Palpable Increase in Muscle Length   10/29/22 Therapeutic Exercise: to improve strength and mobility.  Demo, verbal and tactile cues throughout for technique. Bike L3 x 5 min  Table slides flexion and scaption x 15 Shoulder isometrics using pillow for resistance - shoulder abduction, ER, IR, extension, adduction 10 x 3 sec hold Review of HEP - updated isometrics with instructions on how to perform as done in clinic today.  Manual Therapy: to decrease muscle spasm and pain and improve mobility STM/TPR to cervical paraspinals, R UT, R L/S, R suboccipitals, skilled palpation and monitoring during dry needling. Trigger Point Dry-Needling  Treatment instructions: Expect mild to moderate muscle soreness. S/S of pneumothorax if dry needled over a lung field, and to seek immediate medical attention should they occur. Patient verbalized understanding of these instructions and education.  Patient Consent Given: Yes Education handout provided: Previously provided Muscles treated: R cervical multifidi C5-7, R UT, R L/S Electrical stimulation performed: No Parameters: N/A Treatment response/outcome: Twitch Response Elicited and Palpable Increase in Muscle Length  10/26/22 Therapeutic Exercise: to improve strength and mobility.  Demo, verbal and tactile cues throughout for technique. UBE x 6 min forward Shoulder circles Nerve glide - flexing/extending wrist and neck as tolerated.  Manual Therapy: to decrease muscle spasm and pain and improve mobility STM/TPR to cervical paraspinals, R UT, R L/S, R SCM,  anterior scalenes,  R 1st rib mobs, skilled palpation and monitoring during dry needling. Trigger Point Dry-Needling  Treatment instructions: Expect mild to moderate muscle soreness. S/S of pneumothorax if dry needled over a lung field, and to seek immediate medical attention should they occur. Patient verbalized understanding of these instructions and education. Patient Consent Given: Yes Education handout provided: Previously provided Muscles treated: R cervical multifidi C4-7, R UT, R L/S Electrical stimulation performed: No Parameters: N/A Treatment response/outcome: Twitch Response Elicited and Palpable Increase in Muscle Length   PATIENT EDUCATION: Education details: HEP update 10/15/22 Person educated: Patient Education method: Explanation, Demonstration, Verbal cues, and Handouts Education comprehension: verbalized understanding and returned demonstration  HOME EXERCISE PROGRAM: Access Code: [email or web address] URL: [email or web address] Date: 11/02/2022 Prepared by: Glenetta Hew  Exercises - Seated Scapular Retraction  - 3 x daily - 7 x weekly - 1 sets - 5 reps - 5 sec hold - Standing Isometric Shoulder Internal Rotation at Doorway  - 3 x daily - 7 x weekly - 1 sets - 5 reps - 5 sec  hold - Isometric Shoulder External Rotation at Wall  - 3 x daily - 7 x weekly - 1 sets - 5 reps - 5 sec  hold - Isometric Shoulder Extension at Wall  - 3 x daily - 7 x weekly - 1 sets - 5 reps - 5 sec hold - Isometric Shoulder Adduction  - 1 x daily - 7 x weekly - 3 sets - 10 reps - Seated Shoulder Flexion Towel Slide at Table Top  - 1 x daily - 7 x weekly - 2 sets - 10 reps - Seated Shoulder Towel Slides Scaption at Table  - 1 x daily - 7 x weekly - 3 sets - 10 reps - Gentle Levator Scapulae Stretch  - 3 x daily - 7 x weekly - 1 sets - 3 reps - 15 sec  hold - Shoulder extension with resistance - Neutral  - 1 x daily - 7 x weekly - 2 sets - 10 reps - Scapular Retraction with Resistance  -  1 x daily - 7 x weekly - 2 sets - 10 reps - Seated Assisted Cervical Rotation with Towel  - 2 x daily - 7 x weekly - 1 sets - 10 reps - Cervical Extension AROM with Strap  - 2 x daily - 7 x weekly - 1 sets - 10 reps  ASSESSMENT:  CLINICAL IMPRESSION: Brenda Mcfarland reports good tolerance to isometrics as performed how practiced in clinic, but laying in bed may have applied too much pressure and poor form, aggravating pain.  Recommended performing in sitting at couch only.  She tolerated self-snags with pillowcase very well today without increased pain so these were added to HEP.  Reported decreased pain following manual therapy.    Brenda Mcfarland continues to demonstrate potential for improvement and would benefit from continued skilled therapy to address impairments.      OBJECTIVE IMPAIRMENTS:  decreased activity tolerance, decreased endurance, decreased mobility, decreased ROM, decreased strength, increased fascial restrictions, increased muscle spasms, impaired flexibility, impaired UE functional use, postural dysfunction, and pain.   ACTIVITY LIMITATIONS: carrying, lifting, sleeping, bed mobility, dressing, reach over head, and hygiene/grooming  PARTICIPATION LIMITATIONS:  sewing  PERSONAL FACTORS: Age, Fitness, Past/current experiences, and 3+ comorbidities: history neck pain/surgery, tinnitis, chronic LBP, recent COVID infection with brain fog, glaucoma, obesity, bil knee OA, T2DM, vertigo  are also affecting patient's functional outcome.   REHAB POTENTIAL: Good  CLINICAL DECISION MAKING: Evolving/moderate complexity  EVALUATION COMPLEXITY: Moderate   GOALS: Goals reviewed with patient? Yes  SHORT TERM GOALS: Target date: 10/14/2022   Patient will be independent with initial HEP.  Baseline: needs Goal status: IN PROGRESS 10/06/22- HEP given.  10/15/22- initial HEP modified 11/02/22- cues still needed.    LONG TERM GOALS: Target date: 11/11/2022   Patient will be independent with  advanced/ongoing HEP to improve outcomes and carryover.  Baseline:  Goal status: IN PROGRESS  2.  Patient will report 75% improvement in Right shoulder pain to improve QOL.  Baseline:  Goal status: IN PROGRESS  3. Patient to improve R shoulder AROM to [facility] without pain provocation to allow for increased ease of ADLs.  Baseline: see objective  Goal status: IN PROGRESS  4.  Patient will demonstrate improved functional UE strength as demonstrated by 5/5 R shoulder strength. Baseline: 4+/5 limited by pain Goal status: IN PROGRESS  5.  Patient will report <40% impairment on QuickDash to demonstrate improved functional ability.  Baseline: 47.7% Goal status: IN PROGRESS  PLAN:  PT FREQUENCY: 2x/week  PT DURATION: 6 weeks  PLANNED INTERVENTIONS: Therapeutic exercises, Therapeutic activity, Neuromuscular re-education, Balance training, Gait training, Patient/Family education, Self Care, Joint mobilization, Dry Needling, Electrical stimulation, Spinal mobilization, Cryotherapy, Moist heat, Taping, Traction, Ultrasound, Ionotophoresis '4mg'$ /ml Dexamethasone, Manual therapy, and Re-evaluation  PLAN FOR NEXT SESSION: postural strengthening, stretches as tolerated, manual therapy and TrDN, modalities PRN.  Add nerve glides as tolerated to HEP.    Rennie Natter, PT, DPT  11/02/2022, 11:58 AM

## 2022-11-04 DIAGNOSIS — L820 Inflamed seborrheic keratosis: Secondary | ICD-10-CM | POA: Diagnosis not present

## 2022-11-04 DIAGNOSIS — D485 Neoplasm of uncertain behavior of skin: Secondary | ICD-10-CM | POA: Diagnosis not present

## 2022-11-05 ENCOUNTER — Encounter: Payer: Medicare HMO | Admitting: Physical Therapy

## 2022-11-09 ENCOUNTER — Encounter: Payer: Medicare HMO | Admitting: Physical Therapy

## 2022-11-12 ENCOUNTER — Encounter: Payer: Medicare HMO | Admitting: Physical Therapy

## 2022-11-13 DIAGNOSIS — G4733 Obstructive sleep apnea (adult) (pediatric): Secondary | ICD-10-CM | POA: Diagnosis not present

## 2022-11-16 ENCOUNTER — Ambulatory Visit: Payer: Medicare HMO | Admitting: Physical Therapy

## 2022-11-16 ENCOUNTER — Encounter: Payer: Self-pay | Admitting: Physical Therapy

## 2022-11-16 DIAGNOSIS — M6281 Muscle weakness (generalized): Secondary | ICD-10-CM | POA: Diagnosis not present

## 2022-11-16 DIAGNOSIS — M25511 Pain in right shoulder: Secondary | ICD-10-CM | POA: Diagnosis not present

## 2022-11-16 DIAGNOSIS — R252 Cramp and spasm: Secondary | ICD-10-CM

## 2022-11-16 DIAGNOSIS — R293 Abnormal posture: Secondary | ICD-10-CM

## 2022-11-16 NOTE — Therapy (Signed)
OUTPATIENT PHYSICAL THERAPY Treatment Progress Note Reporting Period 09/30/2022 to 11/16/2022  See note below for Objective Data and Assessment of Progress/Goals.      Patient Name: Allida Lahr MRN: [id number] DOB:1946/01/22, 77 y.o., female Today's Date: 11/16/2022  END OF SESSION:  PT End of Session - 11/16/22 1107     Visit Number 9    Number of Visits 17    Date for PT Re-Evaluation 12/14/22    Authorization Type [employer] MCR + State BCBS    Progress Note Due on Visit 17    PT Start Time 1104    PT Stop Time 1145    PT Time Calculation (min) 41 min    Activity Tolerance Patient tolerated treatment well    Behavior During Therapy WFL for tasks assessed/performed             Past Medical History:  Diagnosis Date   Abnormal EKG 10/23/2016   Arthritis 01/18/2020   Chronic fatigue 10/08/2015   Depression 01/18/2020   Diabetes mellitus ([place]) 10/08/2015   Formatting of this note might be different from the original. Overview:  last Hgb A1C 7.2/no blood sugars at home Formatting of this note might be different from the original. last Hgb A1C 7.2/no blood sugars at home   ESR raised 11/27/2016   Frequent urination 12/16/2018   Glaucoma 10/08/2015   High cholesterol    History of pancreatitis 07/11/2014   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/23/2016   Hypertension 10/08/2015   IFG (impaired fasting glucose) 10/24/2019   Incomplete emptying of bladder 11/30/2017   Knee pain, left 12/22/2016   Loss of memory 01/18/2020   Major depressive disorder in partial remission ([place]) 10/08/2015   Morbid obesity ([place]) 10/23/2016   Obesity (BMI 30-39.9) 11/10/2016   Obstructive sleep apnea 12/16/2018   Osteoarthritis of both knees 11/27/2016   Other insomnia 03/05/2017   Palpitations 10/23/2016   Positive ANA (antinuclear antibody) 10/23/2016   Sleep apnea 10/08/2015   Formatting of this note might be different from the original. Overview:  does not use CPAP Formatting of this note might be different from the original. does  not use CPAP   Sore neck 10/24/2019   Spondylosis of cervical spine with myelopathy 01/18/2020   Type 2 diabetes mellitus without complication, without long-term current use of insulin ([place]) 12/16/2018   Vitamin D deficiency 10/08/2015   Past Surgical History:  Procedure Laterality Date   APPENDECTOMY     age 18   CESAREAN SECTION     X [place]  2015   SPINE SURGERY  05/29/2015   Patient Active Problem List   Diagnosis Date Noted   Nocturia 05/15/2022   Paroxysmal SVT (supraventricular tachycardia) 01/22/2020   Arthritis 01/18/2020   Depression 01/18/2020   Loss of memory 01/18/2020   Spondylosis of cervical spine with myelopathy 01/18/2020   IFG (impaired fasting glucose) 10/24/2019   Frequent urination 12/16/2018   Obstructive sleep apnea 12/16/2018   Type 2 diabetes mellitus without complication, without long-term current use of insulin ([place]) 12/16/2018   Incomplete emptying of bladder 11/30/2017   Insomnia 03/05/2017   Knee pain, left 12/22/2016   ESR raised 11/27/2016   Osteoarthritis of both knees 11/27/2016   Obesity (BMI 30-39.9) 11/10/2016   Abnormal EKG 10/23/2016   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/23/2016   Morbid obesity ([place]) 10/23/2016   Palpitations 10/23/2016   Positive ANA (antinuclear antibody) 10/23/2016   Hypertension 10/08/2015   Chronic fatigue 10/08/2015   Diabetes mellitus ([place]) 10/08/2015  Glaucoma 10/08/2015   Major depressive disorder in partial remission ([place]) 10/08/2015   Vitamin D deficiency 10/08/2015   Sleep apnea 10/08/2015   History of pancreatitis 07/11/2014    PCP: Lauree Chandler, NP  REFERRING PROVIDER: Gregor Hams, MD   REFERRING DIAG: [phone] (ICD-10-CM) - Chronic right shoulder pain M62.838 (ICD-10-CM) - Trapezius muscle spasm  THERAPY DIAG:  Acute pain of right shoulder  Abnormal posture  Cramp and spasm  Muscle weakness (generalized)  Rationale for Evaluation and Treatment: Rehabilitation  ONSET DATE:  08/13/23  SUBJECTIVE:                                                                                                                                                                                      SUBJECTIVE STATEMENT: Layci Centofanti reports the shoulder is about the same, she will move it and it will flare up again.  She is doing her exercises.  Loves the pillowcase exercises for her neck.  Driving again, went to church yesterday.     NEXT MD VISIT:  10/22/2022  PERTINENT HISTORY: history neck pain/surgery, tinnitis, chronic LBP, recent COVID infection with brain fog, glaucoma, obesity, bil knee OA, T2DM, vertigo  PAIN:  Are you having pain? Yes: NPRS scale: 4/10 Pain location: R shoulder  Pain description: radiates down to elbow, shooting, throbbing Aggravating factors: moving arm, raising overhead, putting on bra Relieving factors: keeping it still, pain creme, heat   PRECAUTIONS: None  WEIGHT BEARING RESTRICTIONS: No  FALLS:  Has patient fallen in last 6 months? Yes. Number of falls 1 going up stairs, tripped  LIVING ENVIRONMENT: Lives with: lives alone Lives in: House/apartment Stairs: Yes: Internal: 2 steps; on right going up Has following equipment at home: None  OCCUPATION: Retired   PLOF: Independent  PATIENT GOALS:decrease shoulder pain  NEXT MD VISIT:  10/22/2022  OBJECTIVE:   DIAGNOSTIC FINDINGS:  09/11/2022 Limited diagnostic ultrasound: Very large subacromial and subdeltoid bursitis are present. Rotator cuff tendons appear to be intact without large rotator cuff retracted tear. Accuracy of ultrasound limited by body habitus  10/22/22  DG cervical spine. FINDINGS: The cervical spine is only well visualized to the bottom of C6 on lateral imaging. Anterior plate and screw fusion from C3 through C6 remains. Hardware is in good position. No malalignment. Pre odontoid space and prevertebral soft tissues are normal. Mild degenerative disc disease at C2-3.  Uncovertebral degenerative changes identified, right greater than left. No other bony or soft tissue abnormalities are noted.  PATIENT SURVEYS:  Quick Dash 47.7%  COGNITION: Overall cognitive status: Within functional limits for tasks assessed     SENSATION: WFL reports occasional numbness radiating  to palmar surface R hand   POSTURE: Forward head, rounded shoulders  CERVICAL ROM:  AROM eval 11/16/22  Cervical Flexion 15 20  Cervical Extension 20 20  Cervical Rotation to Left 30 35  Cervical Rotation to Right 45 45  Left Sidebend 15 25  Right Sidebend 10 15    UPPER EXTREMITY ROM:   Active ROM Right eval Left eval Right 11/16/2022  Shoulder flexion 85 125 110  Shoulder extension 55 65 64  Shoulder abduction 85 115 105  Shoulder adduction     Shoulder internal rotation 60 70   Shoulder external rotation 50 65   (Blank rows = not tested)  UPPER EXTREMITY MMT:  MMT Right eval Left eval  Shoulder flexion 4+ 5  Shoulder extension    Shoulder abduction 4+ 5  Shoulder adduction    Shoulder internal rotation 4+ 5  Shoulder external rotation 4+ 5  Elbow flexion 5 5  Elbow extension 5 5  Wrist flexion 5 5  Wrist extension 5 5  Grip strength (lbs) good good  (Blank rows = not tested)  SHOULDER SPECIAL TESTS: NT  JOINT MOBILITY TESTING:  NT  PALPATION:  Tightness R upper trap, R levator scapulae, cervical paraspinals, R deltoid and bicips   TODAY'S TREATMENT:                                                                                                                                         DATE:  11/16/22 Therapeutic Exercise: to improve strength and mobility.  Demo, verbal and tactile cues throughout for technique. Bike L2 x 6 min  Review of HEP S/L shoulder abduction x 5 S/L shoulder flexion x 5 S/L shoulder ER x 5 Therapeutic Activity:  to assess progress towards goals.  MMT, ROM, QuickDash, POC    11/02/2022 Therapeutic Exercise: to improve strength  and mobility.  Demo, verbal and tactile cues throughout for technique. UBE forward L1 x 5 min  Cervical snag extension x 10, rotation x 10 bil  Review of exercises Manual Therapy: to decrease muscle spasm and pain and improve mobility STM/TPR to cervical paraspinals, R UT, R L/S, R suboccipitals, anterior scalenes, skilled palpation and monitoring during dry needling. Trigger Point Dry-Needling  Treatment instructions: Expect mild to moderate muscle soreness. S/S of pneumothorax if dry needled over a lung field, and to seek immediate medical attention should they occur. Patient verbalized understanding of these instructions and education. Patient Consent Given: Yes Education handout provided: Previously provided Muscles treated: R cervical multifidi C5-7, R UT, R L/S Electrical stimulation performed: No Parameters: N/A Treatment response/outcome: Twitch Response Elicited and Palpable Increase in Muscle Length   10/29/22 Therapeutic Exercise: to improve strength and mobility.  Demo, verbal and tactile cues throughout for technique. Bike L3 x 5 min  Table slides flexion and scaption x 15 Shoulder isometrics using pillow for resistance - shoulder abduction, ER, IR, extension,  adduction 10 x 3 sec hold Review of HEP - updated isometrics with instructions on how to perform as done in clinic today.  Manual Therapy: to decrease muscle spasm and pain and improve mobility STM/TPR to cervical paraspinals, R UT, R L/S, R suboccipitals, skilled palpation and monitoring during dry needling. Trigger Point Dry-Needling  Treatment instructions: Expect mild to moderate muscle soreness. S/S of pneumothorax if dry needled over a lung field, and to seek immediate medical attention should they occur. Patient verbalized understanding of these instructions and education. Patient Consent Given: Yes Education handout provided: Previously provided Muscles treated: R cervical multifidi C5-7, R UT, R L/S Electrical  stimulation performed: No Parameters: N/A Treatment response/outcome: Twitch Response Elicited and Palpable Increase in Muscle Length  PATIENT EDUCATION: Education details: HEP update 10/15/22, 11/16/22 Person educated: Patient Education method: Explanation, Demonstration, Verbal cues, and Handouts Education comprehension: verbalized understanding and returned demonstration  HOME EXERCISE PROGRAM: Access Code: [email or web address] URL: [email or web address] Date: 11/16/2022 Prepared by: Glenetta Hew  Exercises - Seated Scapular Retraction  - 3 x daily - 7 x weekly - 1 sets - 5 reps - 5 sec hold - Standing Isometric Shoulder Internal Rotation at Doorway  - 3 x daily - 7 x weekly - 1 sets - 5 reps - 5 sec  hold - Isometric Shoulder External Rotation at Wall  - 3 x daily - 7 x weekly - 1 sets - 5 reps - 5 sec  hold - Isometric Shoulder Extension at Wall  - 3 x daily - 7 x weekly - 1 sets - 5 reps - 5 sec hold - Isometric Shoulder Adduction  - 1 x daily - 7 x weekly - 3 sets - 10 reps - Seated Shoulder Flexion Towel Slide at Table Top  - 1 x daily - 7 x weekly - 2 sets - 10 reps - Seated Shoulder Towel Slides Scaption at Table  - 1 x daily - 7 x weekly - 3 sets - 10 reps - Gentle Levator Scapulae Stretch  - 3 x daily - 7 x weekly - 1 sets - 3 reps - 15 sec  hold - Shoulder extension with resistance - Neutral  - 1 x daily - 7 x weekly - 2 sets - 10 reps - Scapular Retraction with Resistance  - 1 x daily - 7 x weekly - 2 sets - 10 reps - Seated Assisted Cervical Rotation with Towel  - 2 x daily - 7 x weekly - 1 sets - 10 reps - Cervical Extension AROM with Strap  - 2 x daily - 7 x weekly - 1 sets - 10 reps - Sidelying Shoulder Abduction Palm Forward  - 1 x daily - 7 x weekly - 2 sets - 10 reps - Sidelying Shoulder Flexion 15 Degrees  - 1 x daily - 7 x weekly - 2 sets - 10 reps - Sidelying Shoulder External Rotation  - 1 x daily - 7 x weekly - 2 sets - 10 reps  ASSESSMENT:  CLINICAL  IMPRESSION: Jamielyn Lovgren reports about 60% overall improvement in R shoulder pain, but still has painful arc with shoulder flexion and abduction.  Her QuickDash has improved to 25% impairment, meeting LTG #5.  Although not a formal goal, her vertigo has improved with manual therapy to her neck and she is able to drive again.  Today progressed HEP to including S/L shoulder trio for shoulder impingement, she was able to perform these exercises without pain and reported decreased pain after  exercise.  She would benefit from continued skilled therapy, recommending additional 4 weeks, 1-2x/week, to continue to improve R shoulder pain and improve activity tolerance.      OBJECTIVE IMPAIRMENTS: decreased activity tolerance, decreased endurance, decreased mobility, decreased ROM, decreased strength, increased fascial restrictions, increased muscle spasms, impaired flexibility, impaired UE functional use, postural dysfunction, and pain.   ACTIVITY LIMITATIONS: carrying, lifting, sleeping, bed mobility, dressing, reach over head, and hygiene/grooming  PARTICIPATION LIMITATIONS:  sewing  PERSONAL FACTORS: Age, Fitness, Past/current experiences, and 3+ comorbidities: history neck pain/surgery, tinnitis, chronic LBP, recent COVID infection with brain fog, glaucoma, obesity, bil knee OA, T2DM, vertigo  are also affecting patient's functional outcome.   REHAB POTENTIAL: Good  CLINICAL DECISION MAKING: Evolving/moderate complexity  EVALUATION COMPLEXITY: Moderate   GOALS: Goals reviewed with patient? Yes  SHORT TERM GOALS: Target date: 10/14/2022   Patient will be independent with initial HEP.  Baseline: needs Goal status: MET 10/06/22- HEP given.  10/15/22- initial HEP modified 11/02/22- cues still needed.  11/16/22- reports good compliance.    LONG TERM GOALS: Target date: 11/11/2022 extended to 12/14/22  Patient will be independent with advanced/ongoing HEP to improve outcomes and carryover.  Baseline:   Goal status: IN PROGRESS 11/16/22- met for current.   2.  Patient will report 75% improvement in Right shoulder pain to improve QOL.  Baseline:  Goal status: IN PROGRESS 11/16/22 - 60% improvement   3. Patient to improve R shoulder AROM to [facility] without pain provocation to allow for increased ease of ADLs.  Baseline: see objective  Goal status: IN PROGRESS 11/16/22- improved, see objective,   4.  Patient will demonstrate improved functional UE strength as demonstrated by 5/5 R shoulder strength. Baseline: 4+/5 limited by pain Goal status: IN PROGRESS 11/16/22- still painful, but 5/5  5.  Patient will report <40% impairment on QuickDash to demonstrate improved functional ability.  Baseline: 47.7% Goal status: MET 11/16/22 - 25%   PLAN:  PT FREQUENCY: 2x/week  PT DURATION: 4 weeks  PLANNED INTERVENTIONS: Therapeutic exercises, Therapeutic activity, Neuromuscular re-education, Balance training, Gait training, Patient/Family education, Self Care, Joint mobilization, Dry Needling, Electrical stimulation, Spinal mobilization, Cryotherapy, Moist heat, Taping, Traction, Ultrasound, Ionotophoresis 4mg /ml Dexamethasone, Manual therapy, and Re-evaluation  PLAN FOR NEXT SESSION: postural strengthening, stretches as tolerated, manual therapy and TrDN, modalities PRN.  Add nerve glides as tolerated to HEP.    Rennie Natter, PT, DPT  11/16/2022, 11:54 AM

## 2022-11-19 ENCOUNTER — Ambulatory Visit: Payer: Medicare HMO | Admitting: Physical Therapy

## 2022-11-19 ENCOUNTER — Encounter: Payer: Self-pay | Admitting: Physical Therapy

## 2022-11-19 DIAGNOSIS — R252 Cramp and spasm: Secondary | ICD-10-CM

## 2022-11-19 DIAGNOSIS — M25511 Pain in right shoulder: Secondary | ICD-10-CM

## 2022-11-19 DIAGNOSIS — R293 Abnormal posture: Secondary | ICD-10-CM | POA: Diagnosis not present

## 2022-11-19 DIAGNOSIS — M6281 Muscle weakness (generalized): Secondary | ICD-10-CM

## 2022-11-19 NOTE — Therapy (Signed)
OUTPATIENT PHYSICAL THERAPY Treatment  Patient Name: Brenda Mcfarland MRN: [id number] DOB:[birth year], 77 y.o., female Today's Date: 11/19/2022  END OF SESSION:  PT End of Session - 11/19/22 1146     Visit Number 10    Number of Visits 17    Date for PT Re-Evaluation 12/14/22    Authorization Type [employer] MCR + State BCBS    Progress Note Due on Visit 17    PT Start Time 1104    PT Stop Time 1144    PT Time Calculation (min) 40 min    Activity Tolerance Patient tolerated treatment well    Behavior During Therapy WFL for tasks assessed/performed             Past Medical History:  Diagnosis Date   Abnormal EKG 10/23/2016   Arthritis 01/18/2020   Chronic fatigue 10/08/2015   Depression 01/18/2020   Diabetes mellitus ([place]) 10/08/2015   Formatting of this note might be different from the original. Overview:  last Hgb A1C 7.2/no blood sugars at home Formatting of this note might be different from the original. last Hgb A1C 7.2/no blood sugars at home   ESR raised 11/27/2016   Frequent urination 12/16/2018   Glaucoma 10/08/2015   High cholesterol    History of pancreatitis 07/11/2014   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/23/2016   Hypertension 10/08/2015   IFG (impaired fasting glucose) 10/24/2019   Incomplete emptying of bladder 11/30/2017   Knee pain, left 12/22/2016   Loss of memory 01/18/2020   Major depressive disorder in partial remission ([place]) 10/08/2015   Morbid obesity ([place]) 10/23/2016   Obesity (BMI 30-39.9) 11/10/2016   Obstructive sleep apnea 12/16/2018   Osteoarthritis of both knees 11/27/2016   Other insomnia 03/05/2017   Palpitations 10/23/2016   Positive ANA (antinuclear antibody) 10/23/2016   Sleep apnea 10/08/2015   Formatting of this note might be different from the original. Overview:  does not use CPAP Formatting of this note might be different from the original. does not use CPAP   Sore neck 10/24/2019   Spondylosis of cervical spine with myelopathy 01/18/2020   Type 2 diabetes mellitus without  complication, without long-term current use of insulin ([place]) 12/16/2018   Vitamin D deficiency 10/08/2015   Past Surgical History:  Procedure Laterality Date   APPENDECTOMY     age 1   CESAREAN SECTION     X [place]  2015   SPINE SURGERY  05/29/2015   Patient Active Problem List   Diagnosis Date Noted   Nocturia 05/15/2022   Paroxysmal SVT (supraventricular tachycardia) 01/22/2020   Arthritis 01/18/2020   Depression 01/18/2020   Loss of memory 01/18/2020   Spondylosis of cervical spine with myelopathy 01/18/2020   IFG (impaired fasting glucose) 10/24/2019   Frequent urination 12/16/2018   Obstructive sleep apnea 12/16/2018   Type 2 diabetes mellitus without complication, without long-term current use of insulin ([place]) 12/16/2018   Incomplete emptying of bladder 11/30/2017   Insomnia 03/05/2017   Knee pain, left 12/22/2016   ESR raised 11/27/2016   Osteoarthritis of both knees 11/27/2016   Obesity (BMI 30-39.9) 11/10/2016   Abnormal EKG 10/23/2016   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/23/2016   Morbid obesity ([place]) 10/23/2016   Palpitations 10/23/2016   Positive ANA (antinuclear antibody) 10/23/2016   Hypertension 10/08/2015   Chronic fatigue 10/08/2015   Diabetes mellitus ([place]) 10/08/2015   Glaucoma 10/08/2015   Major depressive disorder in partial remission ([place]) 10/08/2015   Vitamin D deficiency 10/08/2015   Sleep apnea  10/08/2015   History of pancreatitis 07/11/2014    PCP: Lauree Chandler, NP  REFERRING PROVIDER: Gregor Hams, MD   REFERRING DIAG: [id number] (ICD-10-CM) - Chronic right shoulder pain M62.838 (ICD-10-CM) - Trapezius muscle spasm  THERAPY DIAG:  Acute pain of right shoulder  Abnormal posture  Cramp and spasm  Muscle weakness (generalized)  Rationale for Evaluation and Treatment: Rehabilitation  ONSET DATE: 08/13/23  SUBJECTIVE:                                                                                                                                                                                       SUBJECTIVE STATEMENT: Brenda Mcfarland reports she tried to do the exercises again after PT and it aggravated the pain, popping again today.  Discouraged that sometimes shoulder feels good, other days it hurts.     NEXT MD VISIT:  10/22/2022  PERTINENT HISTORY: history neck pain/surgery, tinnitis, chronic LBP, recent COVID infection with brain fog, glaucoma, obesity, bil knee OA, T2DM, vertigo  PAIN:  Are you having pain? Yes: NPRS scale: 2-3/10 Pain location: R shoulder  Pain description: radiates down to elbow, shooting, throbbing Aggravating factors: moving arm, raising overhead, putting on bra Relieving factors: keeping it still, pain creme, heat   PRECAUTIONS: None  WEIGHT BEARING RESTRICTIONS: No  FALLS:  Has patient fallen in last 6 months? Yes. Number of falls 1 going up stairs, tripped  LIVING ENVIRONMENT: Lives with: lives alone Lives in: House/apartment Stairs: Yes: Internal: 2 steps; on right going up Has following equipment at home: None  OCCUPATION: Retired   PLOF: Independent  PATIENT GOALS:decrease shoulder pain  NEXT MD VISIT:  10/22/2022  OBJECTIVE:   DIAGNOSTIC FINDINGS:  09/11/2022 Limited diagnostic ultrasound: Very large subacromial and subdeltoid bursitis are present. Rotator cuff tendons appear to be intact without large rotator cuff retracted tear. Accuracy of ultrasound limited by body habitus  10/22/22  DG cervical spine. FINDINGS: The cervical spine is only well visualized to the bottom of C6 on lateral imaging. Anterior plate and screw fusion from C3 through C6 remains. Hardware is in good position. No malalignment. Pre odontoid space and prevertebral soft tissues are normal. Mild degenerative disc disease at C2-3. Uncovertebral degenerative changes identified, right greater than left. No other bony or soft tissue abnormalities are noted.  PATIENT SURVEYS:  Quick Dash  47.7%  COGNITION: Overall cognitive status: Within functional limits for tasks assessed     SENSATION: WFL reports occasional numbness radiating to palmar surface R hand   POSTURE: Forward head, rounded shoulders  CERVICAL ROM:  AROM eval 11/16/22  Cervical Flexion 15 20  Cervical Extension 20 20  Cervical  Rotation to Left 30 35  Cervical Rotation to Right 45 45  Left Sidebend 15 25  Right Sidebend 10 15    UPPER EXTREMITY ROM:   Active ROM Right eval Left eval Right 11/16/2022  Shoulder flexion 85 125 110  Shoulder extension 55 65 64  Shoulder abduction 85 115 105  Shoulder adduction     Shoulder internal rotation 60 70   Shoulder external rotation 50 65   (Blank rows = not tested)  UPPER EXTREMITY MMT:  MMT Right eval Left eval  Shoulder flexion 4+ 5  Shoulder extension    Shoulder abduction 4+ 5  Shoulder adduction    Shoulder internal rotation 4+ 5  Shoulder external rotation 4+ 5  Elbow flexion 5 5  Elbow extension 5 5  Wrist flexion 5 5  Wrist extension 5 5  Grip strength (lbs) good good  (Blank rows = not tested)  SHOULDER SPECIAL TESTS: NT  JOINT MOBILITY TESTING:  NT  PALPATION:  Tightness R upper trap, R levator scapulae, cervical paraspinals, R deltoid and bicips   TODAY'S TREATMENT:                                                                                                                                         DATE:  11/19/22 Therapeutic Exercise: to improve strength and mobility.  Demo, verbal and tactile cues throughout for technique. Nustep L4 x 5 min Review of HEP and levator stretches - without overpressure to avoid aggravating shoulders, to perform s/l exercises only 1x/day.  Manual Therapy: to decrease muscle spasm and pain and improve mobility STM/TPR to L suboccipitals, cervical paraspinals, levator scapulae, UT, deltoid, supraspinatus, triceps, biceps, skilled palpation and monitoring during dry needling. Trigger Point  Dry-Needling  Treatment instructions: Expect mild to moderate muscle soreness. S/S of pneumothorax if dry needled over a lung field, and to seek immediate medical attention should they occur. Patient verbalized understanding of these instructions and education. Patient Consent Given: Yes Education handout provided: Previously provided Muscles treated: L UT, L/S, supraspinatus, deltoid Electrical stimulation performed: No Parameters: N/A Treatment response/outcome: Twitch Response Elicited and Palpable Increase in Muscle Length    11/16/22 Therapeutic Exercise: to improve strength and mobility.  Demo, verbal and tactile cues throughout for technique. Bike L2 x 6 min  Review of HEP S/L shoulder abduction x 5 S/L shoulder flexion x 5 S/L shoulder ER x 5 Therapeutic Activity:  to assess progress towards goals.  MMT, ROM, QuickDash, POC    11/02/2022 Therapeutic Exercise: to improve strength and mobility.  Demo, verbal and tactile cues throughout for technique. UBE forward L1 x 5 min  Cervical snag extension x 10, rotation x 10 bil  Review of exercises Manual Therapy: to decrease muscle spasm and pain and improve mobility STM/TPR to cervical paraspinals, R UT, R L/S, R suboccipitals, anterior scalenes, skilled palpation and monitoring during dry needling. Trigger Point  Dry-Needling  Treatment instructions: Expect mild to moderate muscle soreness. S/S of pneumothorax if dry needled over a lung field, and to seek immediate medical attention should they occur. Patient verbalized understanding of these instructions and education. Patient Consent Given: Yes Education handout provided: Previously provided Muscles treated: R cervical multifidi C5-7, R UT, R L/S Electrical stimulation performed: No Parameters: N/A Treatment response/outcome: Twitch Response Elicited and Palpable Increase in Muscle Length   10/29/22 Therapeutic Exercise: to improve strength and mobility.  Demo, verbal and tactile  cues throughout for technique. Bike L3 x 5 min  Table slides flexion and scaption x 15 Shoulder isometrics using pillow for resistance - shoulder abduction, ER, IR, extension, adduction 10 x 3 sec hold Review of HEP - updated isometrics with instructions on how to perform as done in clinic today.  Manual Therapy: to decrease muscle spasm and pain and improve mobility STM/TPR to cervical paraspinals, R UT, R L/S, R suboccipitals, skilled palpation and monitoring during dry needling. Trigger Point Dry-Needling  Treatment instructions: Expect mild to moderate muscle soreness. S/S of pneumothorax if dry needled over a lung field, and to seek immediate medical attention should they occur. Patient verbalized understanding of these instructions and education. Patient Consent Given: Yes Education handout provided: Previously provided Muscles treated: R cervical multifidi C5-7, R UT, R L/S Electrical stimulation performed: No Parameters: N/A Treatment response/outcome: Twitch Response Elicited and Palpable Increase in Muscle Length  PATIENT EDUCATION: Education details: HEP update 10/15/22, 11/16/22 Person educated: Patient Education method: Explanation, Demonstration, Verbal cues, and Handouts Education comprehension: verbalized understanding and returned demonstration  HOME EXERCISE PROGRAM: Access Code: [email or web address] URL: [email or web address] Date: 11/16/2022 Prepared by: Glenetta Hew  Exercises - Seated Scapular Retraction  - 3 x daily - 7 x weekly - 1 sets - 5 reps - 5 sec hold - Standing Isometric Shoulder Internal Rotation at Doorway  - 3 x daily - 7 x weekly - 1 sets - 5 reps - 5 sec  hold - Isometric Shoulder External Rotation at Wall  - 3 x daily - 7 x weekly - 1 sets - 5 reps - 5 sec  hold - Isometric Shoulder Extension at Wall  - 3 x daily - 7 x weekly - 1 sets - 5 reps - 5 sec hold - Isometric Shoulder Adduction  - 1 x daily - 7 x weekly - 3 sets - 10 reps - Seated  Shoulder Flexion Towel Slide at Table Top  - 1 x daily - 7 x weekly - 2 sets - 10 reps - Seated Shoulder Towel Slides Scaption at Table  - 1 x daily - 7 x weekly - 3 sets - 10 reps - Gentle Levator Scapulae Stretch  - 3 x daily - 7 x weekly - 1 sets - 3 reps - 15 sec  hold - Shoulder extension with resistance - Neutral  - 1 x daily - 7 x weekly - 2 sets - 10 reps - Scapular Retraction with Resistance  - 1 x daily - 7 x weekly - 2 sets - 10 reps - Seated Assisted Cervical Rotation with Towel  - 2 x daily - 7 x weekly - 1 sets - 10 reps - Cervical Extension AROM with Strap  - 2 x daily - 7 x weekly - 1 sets - 10 reps - Sidelying Shoulder Abduction Palm Forward  - 1 x daily - 7 x weekly - 2 sets - 10 reps - Sidelying Shoulder Flexion 15 Degrees  - 1 x  daily - 7 x weekly - 2 sets - 10 reps - Sidelying Shoulder External Rotation  - 1 x daily - 7 x weekly - 2 sets - 10 reps  ASSESSMENT:  CLINICAL IMPRESSION: Maricris Strutz reports continued R shoulder pain, rated pain today as more "annoying" today, reported some sharp pain with palpation over delts, also noted tightness in supraspinatus that may be referring pain.  Focused session on manual therapy, reviewing HEP and again cautioned to avoid overdoing HEP, to perform s/l exercises only 1x/day.  Brenda Mcfarland continues to demonstrate potential for improvement and would benefit from continued skilled therapy to address impairments.       OBJECTIVE IMPAIRMENTS: decreased activity tolerance, decreased endurance, decreased mobility, decreased ROM, decreased strength, increased fascial restrictions, increased muscle spasms, impaired flexibility, impaired UE functional use, postural dysfunction, and pain.   ACTIVITY LIMITATIONS: carrying, lifting, sleeping, bed mobility, dressing, reach over head, and hygiene/grooming  PARTICIPATION LIMITATIONS:  sewing  PERSONAL FACTORS: Age, Fitness, Past/current experiences, and 3+ comorbidities: history neck  pain/surgery, tinnitis, chronic LBP, recent COVID infection with brain fog, glaucoma, obesity, bil knee OA, T2DM, vertigo  are also affecting patient's functional outcome.   REHAB POTENTIAL: Good  CLINICAL DECISION MAKING: Evolving/moderate complexity  EVALUATION COMPLEXITY: Moderate   GOALS: Goals reviewed with patient? Yes  SHORT TERM GOALS: Target date: 10/14/2022   Patient will be independent with initial HEP.  Baseline: needs Goal status: MET 10/06/22- HEP given.  10/15/22- initial HEP modified 11/02/22- cues still needed.  11/16/22- reports good compliance.    LONG TERM GOALS: Target date: 11/11/2022 extended to 12/14/22  Patient will be independent with advanced/ongoing HEP to improve outcomes and carryover.  Baseline:  Goal status: IN PROGRESS 11/16/22- met for current.   2.  Patient will report 75% improvement in Right shoulder pain to improve QOL.  Baseline:  Goal status: IN PROGRESS 11/16/22 - 60% improvement   3. Patient to improve R shoulder AROM to [facility] without pain provocation to allow for increased ease of ADLs.  Baseline: see objective  Goal status: IN PROGRESS 11/16/22- improved, see objective,   4.  Patient will demonstrate improved functional UE strength as demonstrated by 5/5 R shoulder strength. Baseline: 4+/5 limited by pain Goal status: IN PROGRESS 11/16/22- still painful, but 5/5  5.  Patient will report <40% impairment on QuickDash to demonstrate improved functional ability.  Baseline: 47.7% Goal status: MET 11/16/22 - 25%   PLAN:  PT FREQUENCY: 2x/week  PT DURATION: 4 weeks  PLANNED INTERVENTIONS: Therapeutic exercises, Therapeutic activity, Neuromuscular re-education, Balance training, Gait training, Patient/Family education, Self Care, Joint mobilization, Dry Needling, Electrical stimulation, Spinal mobilization, Cryotherapy, Moist heat, Taping, Traction, Ultrasound, Ionotophoresis 4mg /ml Dexamethasone, Manual therapy, and Re-evaluation  PLAN FOR NEXT  SESSION: postural strengthening, stretches as tolerated, manual therapy and TrDN, modalities PRN.  Add nerve glides as tolerated to HEP.    Rennie Natter, PT, DPT  11/19/2022, 11:54 AM

## 2022-11-23 ENCOUNTER — Encounter: Payer: Self-pay | Admitting: Physical Therapy

## 2022-11-23 ENCOUNTER — Ambulatory Visit: Payer: Medicare HMO | Admitting: Physical Therapy

## 2022-11-23 DIAGNOSIS — R293 Abnormal posture: Secondary | ICD-10-CM | POA: Diagnosis not present

## 2022-11-23 DIAGNOSIS — M6281 Muscle weakness (generalized): Secondary | ICD-10-CM | POA: Diagnosis not present

## 2022-11-23 DIAGNOSIS — R252 Cramp and spasm: Secondary | ICD-10-CM

## 2022-11-23 DIAGNOSIS — M25511 Pain in right shoulder: Secondary | ICD-10-CM

## 2022-11-23 NOTE — Therapy (Signed)
OUTPATIENT PHYSICAL THERAPY Treatment  Patient Name: Brenda Mcfarland MRN: [id number] DOB:[birth year], 77 y.o., female Today's Date: 11/23/2022  END OF SESSION:  PT End of Session - 11/23/22 1023     Visit Number 11    Number of Visits 17    Date for PT Re-Evaluation 12/14/22    Authorization Type [employer] MCR + State BCBS    Progress Note Due on Visit 17    PT Start Time 1018    PT Stop Time 1101    PT Time Calculation (min) 43 min    Activity Tolerance Patient tolerated treatment well    Behavior During Therapy WFL for tasks assessed/performed             Past Medical History:  Diagnosis Date   Abnormal EKG 10/23/2016   Arthritis 01/18/2020   Chronic fatigue 10/08/2015   Depression 01/18/2020   Diabetes mellitus ([place]) 10/08/2015   Formatting of this note might be different from the original. Overview:  last Hgb A1C 7.2/no blood sugars at home Formatting of this note might be different from the original. last Hgb A1C 7.2/no blood sugars at home   ESR raised 11/27/2016   Frequent urination 12/16/2018   Glaucoma 10/08/2015   High cholesterol    History of pancreatitis 07/11/2014   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/23/2016   Hypertension 10/08/2015   IFG (impaired fasting glucose) 10/24/2019   Incomplete emptying of bladder 11/30/2017   Knee pain, left 12/22/2016   Loss of memory 01/18/2020   Major depressive disorder in partial remission ([place]) 10/08/2015   Morbid obesity ([place]) 10/23/2016   Obesity (BMI 30-39.9) 11/10/2016   Obstructive sleep apnea 12/16/2018   Osteoarthritis of both knees 11/27/2016   Other insomnia 03/05/2017   Palpitations 10/23/2016   Positive ANA (antinuclear antibody) 10/23/2016   Sleep apnea 10/08/2015   Formatting of this note might be different from the original. Overview:  does not use CPAP Formatting of this note might be different from the original. does not use CPAP   Sore neck 10/24/2019   Spondylosis of cervical spine with myelopathy 01/18/2020   Type 2 diabetes mellitus without  complication, without long-term current use of insulin ([place]) 12/16/2018   Vitamin D deficiency 10/08/2015   Past Surgical History:  Procedure Laterality Date   APPENDECTOMY     age 56   CESAREAN SECTION     X [place]  2015   SPINE SURGERY  05/29/2015   Patient Active Problem List   Diagnosis Date Noted   Nocturia 05/15/2022   Paroxysmal SVT (supraventricular tachycardia) 01/22/2020   Arthritis 01/18/2020   Depression 01/18/2020   Loss of memory 01/18/2020   Spondylosis of cervical spine with myelopathy 01/18/2020   IFG (impaired fasting glucose) 10/24/2019   Frequent urination 12/16/2018   Obstructive sleep apnea 12/16/2018   Type 2 diabetes mellitus without complication, without long-term current use of insulin ([place]) 12/16/2018   Incomplete emptying of bladder 11/30/2017   Insomnia 03/05/2017   Knee pain, left 12/22/2016   ESR raised 11/27/2016   Osteoarthritis of both knees 11/27/2016   Obesity (BMI 30-39.9) 11/10/2016   Abnormal EKG 10/23/2016   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/23/2016   Morbid obesity ([place]) 10/23/2016   Palpitations 10/23/2016   Positive ANA (antinuclear antibody) 10/23/2016   Hypertension 10/08/2015   Chronic fatigue 10/08/2015   Diabetes mellitus ([place]) 10/08/2015   Glaucoma 10/08/2015   Major depressive disorder in partial remission ([place]) 10/08/2015   Vitamin D deficiency 10/08/2015   Sleep apnea  10/08/2015   History of pancreatitis 07/11/2014    PCP: Lauree Chandler, NP  REFERRING PROVIDER: Gregor Hams, MD   REFERRING DIAG: [phone] (ICD-10-CM) - Chronic right shoulder pain M62.838 (ICD-10-CM) - Trapezius muscle spasm  THERAPY DIAG:  Acute pain of right shoulder  Abnormal posture  Cramp and spasm  Muscle weakness (generalized)  Rationale for Evaluation and Treatment: Rehabilitation  ONSET DATE: 08/13/23  SUBJECTIVE:                                                                                                                                                                                       SUBJECTIVE STATEMENT: Brenda Mcfarland reports a touch of vertigo today.  Yesterday miserable more in neck and upper back.  Feels like every other day is a bad day.   NEXT MD VISIT:  10/22/2022  PERTINENT HISTORY: history neck pain/surgery, tinnitis, chronic LBP, recent COVID infection with brain fog, glaucoma, obesity, bil knee OA, T2DM, vertigo  PAIN:  Are you having pain? Yes: NPRS scale: 4/10 Pain location: R upper shoulder/neck   Pain description: radiates down to elbow, shooting, throbbing Aggravating factors: moving arm, raising overhead, putting on bra Relieving factors: keeping it still, pain creme, heat   PRECAUTIONS: None  WEIGHT BEARING RESTRICTIONS: No  FALLS:  Has patient fallen in last 6 months? Yes. Number of falls 1 going up stairs, tripped  LIVING ENVIRONMENT: Lives with: lives alone Lives in: House/apartment Stairs: Yes: Internal: 2 steps; on right going up Has following equipment at home: None  OCCUPATION: Retired   PLOF: Independent  PATIENT GOALS:decrease shoulder pain  NEXT MD VISIT:  10/22/2022  OBJECTIVE:   DIAGNOSTIC FINDINGS:  09/11/2022 Limited diagnostic ultrasound: Very large subacromial and subdeltoid bursitis are present. Rotator cuff tendons appear to be intact without large rotator cuff retracted tear. Accuracy of ultrasound limited by body habitus  10/22/22  DG cervical spine. FINDINGS: The cervical spine is only well visualized to the bottom of C6 on lateral imaging. Anterior plate and screw fusion from C3 through C6 remains. Hardware is in good position. No malalignment. Pre odontoid space and prevertebral soft tissues are normal. Mild degenerative disc disease at C2-3. Uncovertebral degenerative changes identified, right greater than left. No other bony or soft tissue abnormalities are noted.  PATIENT SURVEYS:  Quick Dash 47.7%  COGNITION: Overall cognitive  status: Within functional limits for tasks assessed     SENSATION: WFL reports occasional numbness radiating to palmar surface R hand   POSTURE: Forward head, rounded shoulders  CERVICAL ROM:  AROM eval 11/16/22  Cervical Flexion 15 20  Cervical Extension 20 20  Cervical Rotation to Left 30  35  Cervical Rotation to Right 45 45  Left Sidebend 15 25  Right Sidebend 10 15    UPPER EXTREMITY ROM:   Active ROM Right eval Left eval Right 11/16/2022  Shoulder flexion 85 125 110  Shoulder extension 55 65 64  Shoulder abduction 85 115 105  Shoulder adduction     Shoulder internal rotation 60 70   Shoulder external rotation 50 65   (Blank rows = not tested)  UPPER EXTREMITY MMT:  MMT Right eval Left eval  Shoulder flexion 4+ 5  Shoulder extension    Shoulder abduction 4+ 5  Shoulder adduction    Shoulder internal rotation 4+ 5  Shoulder external rotation 4+ 5  Elbow flexion 5 5  Elbow extension 5 5  Wrist flexion 5 5  Wrist extension 5 5  Grip strength (lbs) good good  (Blank rows = not tested)  SHOULDER SPECIAL TESTS: NT  JOINT MOBILITY TESTING:  NT  PALPATION:  Tightness R upper trap, R levator scapulae, cervical paraspinals, R deltoid and bicips   TODAY'S TREATMENT:                                                                                                                                         DATE:   11/23/22 Therapeutic Exercise: to improve strength and mobility.  Demo, verbal and tactile cues throughout for technique.  Bike L3 x 7 min Cervical SNAGs into flexion and rotation Upper trap stretch Manual Therapy: to decrease muscle spasm and pain and improve mobility STM/TPR to bil cervical paraspinals, R UT and L/S, NAGs into rotation, skilled palpation and monitoring during dry needling. Trigger Point Dry-Needling  Treatment instructions: Expect mild to moderate muscle soreness. S/S of pneumothorax if dry needled over a lung field, and to seek  immediate medical attention should they occur. Patient verbalized understanding of these instructions and education. Patient Consent Given: Yes Education handout provided: Previously provided Muscles treated: bil cervical multifidi C3-6, R UT, R L/S Electrical stimulation performed: No Parameters: N/A Treatment response/outcome: Twitch Response Elicited and Palpable Increase in Muscle Length   11/19/22 Therapeutic Exercise: to improve strength and mobility.  Demo, verbal and tactile cues throughout for technique. Nustep L4 x 5 min Review of HEP and levator stretches - without overpressure to avoid aggravating shoulders, to perform s/l exercises only 1x/day.  Manual Therapy: to decrease muscle spasm and pain and improve mobility STM/TPR to R suboccipitals, cervical paraspinals, levator scapulae, UT, deltoid, supraspinatus, triceps, biceps, skilled palpation and monitoring during dry needling. Trigger Point Dry-Needling  Treatment instructions: Expect mild to moderate muscle soreness. S/S of pneumothorax if dry needled over a lung field, and to seek immediate medical attention should they occur. Patient verbalized understanding of these instructions and education. Patient Consent Given: Yes Education handout provided: Previously provided Muscles treated: R UT, L/S, supraspinatus, deltoid Electrical stimulation performed: No Parameters: N/A Treatment response/outcome: Twitch Response  Elicited and Palpable Increase in Muscle Length    11/16/22 Therapeutic Exercise: to improve strength and mobility.  Demo, verbal and tactile cues throughout for technique. Bike L2 x 6 min  Review of HEP S/L shoulder abduction x 5 S/L shoulder flexion x 5 S/L shoulder ER x 5 Therapeutic Activity:  to assess progress towards goals.  MMT, ROM, QuickDash, POC    PATIENT EDUCATION: Education details: HEP update 10/15/22, 11/16/22 Person educated: Patient Education method: Explanation, Demonstration, Verbal  cues, and Handouts Education comprehension: verbalized understanding and returned demonstration  HOME EXERCISE PROGRAM: Access Code: [email or web address] URL: [email or web address] Date: 11/16/2022 Prepared by: Glenetta Hew  Exercises - Seated Scapular Retraction  - 3 x daily - 7 x weekly - 1 sets - 5 reps - 5 sec hold - Standing Isometric Shoulder Internal Rotation at Doorway  - 3 x daily - 7 x weekly - 1 sets - 5 reps - 5 sec  hold - Isometric Shoulder External Rotation at Wall  - 3 x daily - 7 x weekly - 1 sets - 5 reps - 5 sec  hold - Isometric Shoulder Extension at Wall  - 3 x daily - 7 x weekly - 1 sets - 5 reps - 5 sec hold - Isometric Shoulder Adduction  - 1 x daily - 7 x weekly - 3 sets - 10 reps - Seated Shoulder Flexion Towel Slide at Table Top  - 1 x daily - 7 x weekly - 2 sets - 10 reps - Seated Shoulder Towel Slides Scaption at Table  - 1 x daily - 7 x weekly - 3 sets - 10 reps - Gentle Levator Scapulae Stretch  - 3 x daily - 7 x weekly - 1 sets - 3 reps - 15 sec  hold - Shoulder extension with resistance - Neutral  - 1 x daily - 7 x weekly - 2 sets - 10 reps - Scapular Retraction with Resistance  - 1 x daily - 7 x weekly - 2 sets - 10 reps - Seated Assisted Cervical Rotation with Towel  - 2 x daily - 7 x weekly - 1 sets - 10 reps - Cervical Extension AROM with Strap  - 2 x daily - 7 x weekly - 1 sets - 10 reps - Sidelying Shoulder Abduction Palm Forward  - 1 x daily - 7 x weekly - 2 sets - 10 reps - Sidelying Shoulder Flexion 15 Degrees  - 1 x daily - 7 x weekly - 2 sets - 10 reps - Sidelying Shoulder External Rotation  - 1 x daily - 7 x weekly - 2 sets - 10 reps  ASSESSMENT:  CLINICAL IMPRESSION: Brenda Mcfarland reported more neck/UT pain and vertigo today, reviewed (and corrected) SNAGs, followed by manual therapy, she reported decreased tightness and pulling when performed SNAGS again after manual.  MHP applied after session x 10 min for further muscle relaxation.    Brenda Mcfarland continues to demonstrate potential for improvement and would benefit from continued skilled therapy to address impairments.       OBJECTIVE IMPAIRMENTS: decreased activity tolerance, decreased endurance, decreased mobility, decreased ROM, decreased strength, increased fascial restrictions, increased muscle spasms, impaired flexibility, impaired UE functional use, postural dysfunction, and pain.   ACTIVITY LIMITATIONS: carrying, lifting, sleeping, bed mobility, dressing, reach over head, and hygiene/grooming  PARTICIPATION LIMITATIONS:  sewing  PERSONAL FACTORS: Age, Fitness, Past/current experiences, and 3+ comorbidities: history neck pain/surgery, tinnitis, chronic LBP, recent COVID infection with brain fog, glaucoma, obesity, bil knee  OA, T2DM, vertigo  are also affecting patient's functional outcome.   REHAB POTENTIAL: Good  CLINICAL DECISION MAKING: Evolving/moderate complexity  EVALUATION COMPLEXITY: Moderate   GOALS: Goals reviewed with patient? Yes  SHORT TERM GOALS: Target date: 10/14/2022   Patient will be independent with initial HEP.  Baseline: needs Goal status: MET 10/06/22- HEP given.  10/15/22- initial HEP modified 11/02/22- cues still needed.  11/16/22- reports good compliance.    LONG TERM GOALS: Target date: 11/11/2022 extended to 12/14/22  Patient will be independent with advanced/ongoing HEP to improve outcomes and carryover.  Baseline:  Goal status: IN PROGRESS 11/16/22- met for current.   2.  Patient will report 75% improvement in Right shoulder pain to improve QOL.  Baseline:  Goal status: IN PROGRESS 11/16/22 - 60% improvement   3. Patient to improve R shoulder AROM to [facility] without pain provocation to allow for increased ease of ADLs.  Baseline: see objective  Goal status: IN PROGRESS 11/16/22- improved, see objective,   4.  Patient will demonstrate improved functional UE strength as demonstrated by 5/5 R shoulder strength. Baseline: 4+/5 limited  by pain Goal status: IN PROGRESS 11/16/22- still painful, but 5/5  5.  Patient will report <40% impairment on QuickDash to demonstrate improved functional ability.  Baseline: 47.7% Goal status: MET 11/16/22 - 25%   PLAN:  PT FREQUENCY: 2x/week  PT DURATION: 4 weeks  PLANNED INTERVENTIONS: Therapeutic exercises, Therapeutic activity, Neuromuscular re-education, Balance training, Gait training, Patient/Family education, Self Care, Joint mobilization, Dry Needling, Electrical stimulation, Spinal mobilization, Cryotherapy, Moist heat, Taping, Traction, Ultrasound, Ionotophoresis 4mg /ml Dexamethasone, Manual therapy, and Re-evaluation  PLAN FOR NEXT SESSION: postural strengthening, stretches as tolerated, manual therapy and TrDN, modalities PRN.  Add nerve glides as tolerated to HEP.    Rennie Natter, PT, DPT  11/23/2022, 12:06 PM

## 2022-11-26 ENCOUNTER — Encounter: Payer: Self-pay | Admitting: Physical Therapy

## 2022-11-26 ENCOUNTER — Ambulatory Visit: Payer: Medicare HMO | Admitting: Physical Therapy

## 2022-11-26 DIAGNOSIS — R252 Cramp and spasm: Secondary | ICD-10-CM | POA: Diagnosis not present

## 2022-11-26 DIAGNOSIS — M25511 Pain in right shoulder: Secondary | ICD-10-CM | POA: Diagnosis not present

## 2022-11-26 DIAGNOSIS — M6281 Muscle weakness (generalized): Secondary | ICD-10-CM

## 2022-11-26 DIAGNOSIS — R293 Abnormal posture: Secondary | ICD-10-CM

## 2022-11-26 NOTE — Therapy (Signed)
OUTPATIENT PHYSICAL THERAPY Treatment  Patient Name: Brenda Mcfarland MRN: [id number] DOB:10/09/1945, 77 y.o., female Today's Date: 11/26/2022  END OF SESSION:  PT End of Session - 11/26/22 1022     Visit Number 12    Number of Visits 17    Date for PT Re-Evaluation 12/14/22    Authorization Type [employer] MCR + State BCBS    Progress Note Due on Visit 17    PT Start Time 1019    PT Stop Time 1104    PT Time Calculation (min) 45 min    Activity Tolerance Patient tolerated treatment well    Behavior During Therapy WFL for tasks assessed/performed             Past Medical History:  Diagnosis Date   Abnormal EKG 10/23/2016   Arthritis 01/18/2020   Chronic fatigue 10/08/2015   Depression 01/18/2020   Diabetes mellitus ([place]) 10/08/2015   Formatting of this note might be different from the original. Overview:  last Hgb A1C 7.2/no blood sugars at home Formatting of this note might be different from the original. last Hgb A1C 7.2/no blood sugars at home   ESR raised 11/27/2016   Frequent urination 12/16/2018   Glaucoma 10/08/2015   High cholesterol    History of pancreatitis 07/11/2014   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/23/2016   Hypertension 10/08/2015   IFG (impaired fasting glucose) 10/24/2019   Incomplete emptying of bladder 11/30/2017   Knee pain, left 12/22/2016   Loss of memory 01/18/2020   Major depressive disorder in partial remission ([place]) 10/08/2015   Morbid obesity ([place]) 10/23/2016   Obesity (BMI 30-39.9) 11/10/2016   Obstructive sleep apnea 12/16/2018   Osteoarthritis of both knees 11/27/2016   Other insomnia 03/05/2017   Palpitations 10/23/2016   Positive ANA (antinuclear antibody) 10/23/2016   Sleep apnea 10/08/2015   Formatting of this note might be different from the original. Overview:  does not use CPAP Formatting of this note might be different from the original. does not use CPAP   Sore neck 10/24/2019   Spondylosis of cervical spine with myelopathy 01/18/2020   Type 2 diabetes mellitus without  complication, without long-term current use of insulin ([place]) 12/16/2018   Vitamin D deficiency 10/08/2015   Past Surgical History:  Procedure Laterality Date   APPENDECTOMY     age 30   CESAREAN SECTION     X [place]  2015   SPINE SURGERY  05/29/2015   Patient Active Problem List   Diagnosis Date Noted   Nocturia 05/15/2022   Paroxysmal SVT (supraventricular tachycardia) 01/22/2020   Arthritis 01/18/2020   Depression 01/18/2020   Loss of memory 01/18/2020   Spondylosis of cervical spine with myelopathy 01/18/2020   IFG (impaired fasting glucose) 10/24/2019   Frequent urination 12/16/2018   Obstructive sleep apnea 12/16/2018   Type 2 diabetes mellitus without complication, without long-term current use of insulin ([place]) 12/16/2018   Incomplete emptying of bladder 11/30/2017   Insomnia 03/05/2017   Knee pain, left 12/22/2016   ESR raised 11/27/2016   Osteoarthritis of both knees 11/27/2016   Obesity (BMI 30-39.9) 11/10/2016   Abnormal EKG 10/23/2016   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/23/2016   Morbid obesity ([place]) 10/23/2016   Palpitations 10/23/2016   Positive ANA (antinuclear antibody) 10/23/2016   Hypertension 10/08/2015   Chronic fatigue 10/08/2015   Diabetes mellitus ([place]) 10/08/2015   Glaucoma 10/08/2015   Major depressive disorder in partial remission ([place]) 10/08/2015   Vitamin D deficiency 10/08/2015   Sleep apnea  10/08/2015   History of pancreatitis 07/11/2014    PCP: Lauree Chandler, NP  REFERRING PROVIDER: Gregor Hams, MD   REFERRING DIAG: [phone] (ICD-10-CM) - Chronic right shoulder pain M62.838 (ICD-10-CM) - Trapezius muscle spasm  THERAPY DIAG:  Acute pain of right shoulder  Abnormal posture  Cramp and spasm  Muscle weakness (generalized)  Rationale for Evaluation and Treatment: Rehabilitation  ONSET DATE: 08/13/23  SUBJECTIVE:                                                                                                                                                                                       SUBJECTIVE STATEMENT: Brenda Mcfarland reported more vertigo after TrDN last session, but it improved after a day.  Discouraged as still has a lot of R shoulder pain.   NEXT MD VISIT:  10/22/2022  PERTINENT HISTORY: history neck pain/surgery, tinnitis, chronic LBP, recent COVID infection with brain fog, glaucoma, obesity, bil knee OA, T2DM, vertigo  PAIN:  Are you having pain? Yes: NPRS scale: 4/10 Pain location: R upper shoulder/neck   Pain description: radiates down to elbow, shooting, throbbing Aggravating factors: moving arm, raising overhead, putting on bra Relieving factors: keeping it still, pain creme, heat   PRECAUTIONS: None  WEIGHT BEARING RESTRICTIONS: No  FALLS:  Has patient fallen in last 6 months? Yes. Number of falls 1 going up stairs, tripped  LIVING ENVIRONMENT: Lives with: lives alone Lives in: House/apartment Stairs: Yes: Internal: 2 steps; on right going up Has following equipment at home: None  OCCUPATION: Retired   PLOF: Independent  PATIENT GOALS:decrease shoulder pain  NEXT MD VISIT:  10/22/2022  OBJECTIVE:   DIAGNOSTIC FINDINGS:  09/11/2022 Limited diagnostic ultrasound: Very large subacromial and subdeltoid bursitis are present. Rotator cuff tendons appear to be intact without large rotator cuff retracted tear. Accuracy of ultrasound limited by body habitus  10/22/22  DG cervical spine. FINDINGS: The cervical spine is only well visualized to the bottom of C6 on lateral imaging. Anterior plate and screw fusion from C3 through C6 remains. Hardware is in good position. No malalignment. Pre odontoid space and prevertebral soft tissues are normal. Mild degenerative disc disease at C2-3. Uncovertebral degenerative changes identified, right greater than left. No other bony or soft tissue abnormalities are noted.  PATIENT SURVEYS:  Quick Dash 47.7%  COGNITION: Overall cognitive  status: Within functional limits for tasks assessed     SENSATION: WFL reports occasional numbness radiating to palmar surface R hand   POSTURE: Forward head, rounded shoulders  CERVICAL ROM:  AROM eval 11/16/22  Cervical Flexion 15 20  Cervical Extension 20 20  Cervical Rotation to Left 30 35  Cervical Rotation to Right 45 45  Left Sidebend 15 25  Right Sidebend 10 15    UPPER EXTREMITY ROM:   Active ROM Right eval Left eval Right 11/16/2022  Shoulder flexion 85 125 110  Shoulder extension 55 65 64  Shoulder abduction 85 115 105  Shoulder adduction     Shoulder internal rotation 60 70   Shoulder external rotation 50 65   (Blank rows = not tested)  UPPER EXTREMITY MMT:  MMT Right eval Left eval  Shoulder flexion 4+ 5  Shoulder extension    Shoulder abduction 4+ 5  Shoulder adduction    Shoulder internal rotation 4+ 5  Shoulder external rotation 4+ 5  Elbow flexion 5 5  Elbow extension 5 5  Wrist flexion 5 5  Wrist extension 5 5  Grip strength (lbs) good good  (Blank rows = not tested)  SHOULDER SPECIAL TESTS: NT  JOINT MOBILITY TESTING:  NT  PALPATION:  Tightness R upper trap, R levator scapulae, cervical paraspinals, R deltoid and bicips   TODAY'S TREATMENT:                                                                                                                                         DATE:   11/25/22 Therapeutic Exercise: to improve strength and mobility.  Demo, verbal and tactile cues throughout for technique. Nustep L5 x 7 min  Seated hip stretches - piriformis, figure 4, hip flexor stretch bil Manual Therapy: to decrease muscle spasm and pain and improve mobility STM/TPR to bil cervical paraspinals R UT and L/S, R biceps.   11/23/22 Therapeutic Exercise: to improve strength and mobility.  Demo, verbal and tactile cues throughout for technique.  Bike L3 x 7 min Cervical SNAGs into flexion and rotation Upper trap stretch Manual Therapy: to  decrease muscle spasm and pain and improve mobility STM/TPR to bil cervical paraspinals, R UT and L/S, NAGs into rotation, skilled palpation and monitoring during dry needling. Trigger Point Dry-Needling  Treatment instructions: Expect mild to moderate muscle soreness. S/S of pneumothorax if dry needled over a lung field, and to seek immediate medical attention should they occur. Patient verbalized understanding of these instructions and education. Patient Consent Given: Yes Education handout provided: Previously provided Muscles treated: bil cervical multifidi C3-6, R UT, R L/S Electrical stimulation performed: No Parameters: N/A Treatment response/outcome: Twitch Response Elicited and Palpable Increase in Muscle Length   11/19/22 Therapeutic Exercise: to improve strength and mobility.  Demo, verbal and tactile cues throughout for technique. Nustep L4 x 5 min Review of HEP and levator stretches - without overpressure to avoid aggravating shoulders, to perform s/l exercises only 1x/day.  Manual Therapy: to decrease muscle spasm and pain and improve mobility STM/TPR to R suboccipitals, cervical paraspinals, levator scapulae, UT, deltoid, supraspinatus, triceps, biceps, skilled palpation and monitoring during dry needling. Trigger Point Dry-Needling  Treatment instructions: Expect mild to  moderate muscle soreness. S/S of pneumothorax if dry needled over a lung field, and to seek immediate medical attention should they occur. Patient verbalized understanding of these instructions and education. Patient Consent Given: Yes Education handout provided: Previously provided Muscles treated: R UT, L/S, supraspinatus, deltoid Electrical stimulation performed: No Parameters: N/A Treatment response/outcome: Twitch Response Elicited and Palpable Increase in Muscle Length    11/16/22 Therapeutic Exercise: to improve strength and mobility.  Demo, verbal and tactile cues throughout for technique. Bike L2 x  6 min  Review of HEP S/L shoulder abduction x 5 S/L shoulder flexion x 5 S/L shoulder ER x 5 Therapeutic Activity:  to assess progress towards goals.  MMT, ROM, QuickDash, POC    PATIENT EDUCATION: Education details: HEP update 10/15/22, 11/16/22 Person educated: Patient Education method: Explanation, Demonstration, Verbal cues, and Handouts Education comprehension: verbalized understanding and returned demonstration  HOME EXERCISE PROGRAM: Access Code: [email or web address] URL: [email or web address] Date: 11/16/2022 Prepared by: Glenetta Hew  Exercises - Seated Scapular Retraction  - 3 x daily - 7 x weekly - 1 sets - 5 reps - 5 sec hold - Standing Isometric Shoulder Internal Rotation at Doorway  - 3 x daily - 7 x weekly - 1 sets - 5 reps - 5 sec  hold - Isometric Shoulder External Rotation at Wall  - 3 x daily - 7 x weekly - 1 sets - 5 reps - 5 sec  hold - Isometric Shoulder Extension at Wall  - 3 x daily - 7 x weekly - 1 sets - 5 reps - 5 sec hold - Isometric Shoulder Adduction  - 1 x daily - 7 x weekly - 3 sets - 10 reps - Seated Shoulder Flexion Towel Slide at Table Top  - 1 x daily - 7 x weekly - 2 sets - 10 reps - Seated Shoulder Towel Slides Scaption at Table  - 1 x daily - 7 x weekly - 3 sets - 10 reps - Gentle Levator Scapulae Stretch  - 3 x daily - 7 x weekly - 1 sets - 3 reps - 15 sec  hold - Shoulder extension with resistance - Neutral  - 1 x daily - 7 x weekly - 2 sets - 10 reps - Scapular Retraction with Resistance  - 1 x daily - 7 x weekly - 2 sets - 10 reps - Seated Assisted Cervical Rotation with Towel  - 2 x daily - 7 x weekly - 1 sets - 10 reps - Cervical Extension AROM with Strap  - 2 x daily - 7 x weekly - 1 sets - 10 reps - Sidelying Shoulder Abduction Palm Forward  - 1 x daily - 7 x weekly - 2 sets - 10 reps - Sidelying Shoulder Flexion 15 Degrees  - 1 x daily - 7 x weekly - 2 sets - 10 reps - Sidelying Shoulder External Rotation  - 1 x daily - 7 x  weekly - 2 sets - 10 reps  ASSESSMENT:  CLINICAL IMPRESSION: Brenda Mcfarland reports continued R shoulder pain.  From MD notes noted that R subacromial bursa was injected but subdeltoid bursa was not.  Continues to have large palpable trigger point in R levator and muscle tension throughout cervical paraspinals and Upper trap and poor tolerance to exercise.  Recommended if she is not feeling she is improving returning to MD to discuss next steps, but continue to work on exercises as tolerated and attend to posture.  Focus of skilled therapy primarily manual therapy today  without TrDN as felt it aggravated vertigo last session.    Brenda Mcfarland continues to demonstrate potential for improvement and would benefit from continued skilled therapy to address impairments.       OBJECTIVE IMPAIRMENTS: decreased activity tolerance, decreased endurance, decreased mobility, decreased ROM, decreased strength, increased fascial restrictions, increased muscle spasms, impaired flexibility, impaired UE functional use, postural dysfunction, and pain.   ACTIVITY LIMITATIONS: carrying, lifting, sleeping, bed mobility, dressing, reach over head, and hygiene/grooming  PARTICIPATION LIMITATIONS:  sewing  PERSONAL FACTORS: Age, Fitness, Past/current experiences, and 3+ comorbidities: history neck pain/surgery, tinnitis, chronic LBP, recent COVID infection with brain fog, glaucoma, obesity, bil knee OA, T2DM, vertigo  are also affecting patient's functional outcome.   REHAB POTENTIAL: Good  CLINICAL DECISION MAKING: Evolving/moderate complexity  EVALUATION COMPLEXITY: Moderate   GOALS: Goals reviewed with patient? Yes  SHORT TERM GOALS: Target date: 10/14/2022   Patient will be independent with initial HEP.  Baseline: needs Goal status: MET 10/06/22- HEP given.  10/15/22- initial HEP modified 11/02/22- cues still needed.  11/16/22- reports good compliance.    LONG TERM GOALS: Target date: 11/11/2022 extended to  12/14/22  Patient will be independent with advanced/ongoing HEP to improve outcomes and carryover.  Baseline:  Goal status: IN PROGRESS 11/16/22- met for current.   2.  Patient will report 75% improvement in Right shoulder pain to improve QOL.  Baseline:  Goal status: IN PROGRESS 11/16/22 - 60% improvement   3. Patient to improve R shoulder AROM to [facility] without pain provocation to allow for increased ease of ADLs.  Baseline: see objective  Goal status: IN PROGRESS 11/16/22- improved, see objective,   4.  Patient will demonstrate improved functional UE strength as demonstrated by 5/5 R shoulder strength. Baseline: 4+/5 limited by pain Goal status: IN PROGRESS 11/16/22- still painful, but 5/5  5.  Patient will report <40% impairment on QuickDash to demonstrate improved functional ability.  Baseline: 47.7% Goal status: MET 11/16/22 - 25%   PLAN:  PT FREQUENCY: 2x/week  PT DURATION: 4 weeks  PLANNED INTERVENTIONS: Therapeutic exercises, Therapeutic activity, Neuromuscular re-education, Balance training, Gait training, Patient/Family education, Self Care, Joint mobilization, Dry Needling, Electrical stimulation, Spinal mobilization, Cryotherapy, Moist heat, Taping, Traction, Ultrasound, Ionotophoresis 4mg /ml Dexamethasone, Manual therapy, and Re-evaluation  PLAN FOR NEXT SESSION: postural strengthening, stretches as tolerated, manual therapy and TrDN, modalities PRN.  Add nerve glides as tolerated to HEP.    Rennie Natter, PT, DPT  11/26/2022, 12:21 PM

## 2022-11-27 ENCOUNTER — Encounter: Payer: Self-pay | Admitting: Family Medicine

## 2022-11-30 ENCOUNTER — Ambulatory Visit: Payer: Medicare HMO | Attending: Family Medicine | Admitting: Physical Therapy

## 2022-11-30 ENCOUNTER — Encounter: Payer: Self-pay | Admitting: Physical Therapy

## 2022-11-30 DIAGNOSIS — R293 Abnormal posture: Secondary | ICD-10-CM | POA: Diagnosis not present

## 2022-11-30 DIAGNOSIS — R252 Cramp and spasm: Secondary | ICD-10-CM | POA: Insufficient documentation

## 2022-11-30 DIAGNOSIS — M6281 Muscle weakness (generalized): Secondary | ICD-10-CM | POA: Insufficient documentation

## 2022-11-30 DIAGNOSIS — M25511 Pain in right shoulder: Secondary | ICD-10-CM | POA: Insufficient documentation

## 2022-11-30 NOTE — Therapy (Signed)
OUTPATIENT PHYSICAL THERAPY Treatment  Patient Name: Brenda Mcfarland MRN: [id number] DOB:02-18-1946, 77 y.o., female Today's Date: 11/30/2022  END OF SESSION:  PT End of Session - 11/30/22 1406     Visit Number 13    Number of Visits 17    Date for PT Re-Evaluation 12/14/22    Authorization Type [employer] MCR + State BCBS    Progress Note Due on Visit 17    PT Start Time 1403    PT Stop Time 1442    PT Time Calculation (min) 39 min    Activity Tolerance Patient tolerated treatment well    Behavior During Therapy WFL for tasks assessed/performed             Past Medical History:  Diagnosis Date   Abnormal EKG 10/23/2016   Arthritis 01/18/2020   Chronic fatigue 10/08/2015   Depression 01/18/2020   Diabetes mellitus 10/08/2015   Formatting of this note might be different from the original. Overview:  last Hgb A1C 7.2/no blood sugars at home Formatting of this note might be different from the original. last Hgb A1C 7.2/no blood sugars at home   ESR raised 11/27/2016   Frequent urination 12/16/2018   Glaucoma 10/08/2015   High cholesterol    History of pancreatitis 07/11/2014   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/23/2016   Hypertension 10/08/2015   IFG (impaired fasting glucose) 10/24/2019   Incomplete emptying of bladder 11/30/2017   Knee pain, left 12/22/2016   Loss of memory 01/18/2020   Major depressive disorder in partial remission 10/08/2015   Morbid obesity 10/23/2016   Obesity (BMI 30-39.9) 11/10/2016   Obstructive sleep apnea 12/16/2018   Osteoarthritis of both knees 11/27/2016   Other insomnia 03/05/2017   Palpitations 10/23/2016   Positive ANA (antinuclear antibody) 10/23/2016   Sleep apnea 10/08/2015   Formatting of this note might be different from the original. Overview:  does not use CPAP Formatting of this note might be different from the original. does not use CPAP   Sore neck 10/24/2019   Spondylosis of cervical spine with myelopathy 01/18/2020   Type 2 diabetes mellitus without complication, without  long-term current use of insulin 12/16/2018   Vitamin D deficiency 10/08/2015   Past Surgical History:  Procedure Laterality Date   APPENDECTOMY     age 67   CESAREAN SECTION     X [place]  2015   SPINE SURGERY  05/29/2015   Patient Active Problem List   Diagnosis Date Noted   Nocturia 05/15/2022   Paroxysmal SVT (supraventricular tachycardia) 01/22/2020   Arthritis 01/18/2020   Depression 01/18/2020   Loss of memory 01/18/2020   Spondylosis of cervical spine with myelopathy 01/18/2020   IFG (impaired fasting glucose) 10/24/2019   Frequent urination 12/16/2018   Obstructive sleep apnea 12/16/2018   Type 2 diabetes mellitus without complication, without long-term current use of insulin 12/16/2018   Incomplete emptying of bladder 11/30/2017   Insomnia 03/05/2017   Knee pain, left 12/22/2016   ESR raised 11/27/2016   Osteoarthritis of both knees 11/27/2016   Obesity (BMI 30-39.9) 11/10/2016   Abnormal EKG 10/23/2016   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/23/2016   Morbid obesity 10/23/2016   Palpitations 10/23/2016   Positive ANA (antinuclear antibody) 10/23/2016   Hypertension 10/08/2015   Chronic fatigue 10/08/2015   Diabetes mellitus 10/08/2015   Glaucoma 10/08/2015   Major depressive disorder in partial remission 10/08/2015   Vitamin D deficiency 10/08/2015   Sleep apnea 10/08/2015   History of pancreatitis 07/11/2014  PCP: Lauree Chandler, NP  REFERRING PROVIDER: Gregor Hams, MD   REFERRING DIAG: [phone] (ICD-10-CM) - Chronic right shoulder pain M62.838 (ICD-10-CM) - Trapezius muscle spasm  THERAPY DIAG:  Acute pain of right shoulder  Abnormal posture  Cramp and spasm  Muscle weakness (generalized)  Rationale for Evaluation and Treatment: Rehabilitation  ONSET DATE: 08/13/23  SUBJECTIVE:                                                                                                                                                                                       SUBJECTIVE STATEMENT: Brenda Mcfarland still reports more dizziness still.  She still hasn't had any improvement in her shoulder and is planning on returning to Dr. Georgina Snell, but very anxious about drive across town, will have to get her daughter to take her.  The towel stretches are helping neck.  Still having pain front of R hip as well.    NEXT MD VISIT:  10/22/2022  PERTINENT HISTORY: history neck pain/surgery, tinnitis, chronic LBP, recent COVID infection with brain fog, glaucoma, obesity, bil knee OA, T2DM, vertigo  PAIN:  Are you having pain? Yes: NPRS scale: 2-3/10 Pain location: R upper shoulder/neck   Pain description: radiates down to elbow, shooting, throbbing Aggravating factors: moving arm, raising overhead, putting on bra Relieving factors: keeping it still, pain creme, heat   PRECAUTIONS: None  WEIGHT BEARING RESTRICTIONS: No  FALLS:  Has patient fallen in last 6 months? Yes. Number of falls 1 going up stairs, tripped  LIVING ENVIRONMENT: Lives with: lives alone Lives in: House/apartment Stairs: Yes: Internal: 2 steps; on right going up Has following equipment at home: None  OCCUPATION: Retired   PLOF: Independent  PATIENT GOALS:decrease shoulder pain  NEXT MD VISIT:  10/22/2022  OBJECTIVE:   DIAGNOSTIC FINDINGS:  09/11/2022 Limited diagnostic ultrasound: Very large subacromial and subdeltoid bursitis are present. Rotator cuff tendons appear to be intact without large rotator cuff retracted tear. Accuracy of ultrasound limited by body habitus  10/22/22  DG cervical spine. FINDINGS: The cervical spine is only well visualized to the bottom of C6 on lateral imaging. Anterior plate and screw fusion from C3 through C6 remains. Hardware is in good position. No malalignment. Pre odontoid space and prevertebral soft tissues are normal. Mild degenerative disc disease at C2-3. Uncovertebral degenerative changes identified, right greater than left. No  other bony or soft tissue abnormalities are noted.  PATIENT SURVEYS:  Quick Dash 47.7%  COGNITION: Overall cognitive status: Within functional limits for tasks assessed     SENSATION: WFL reports occasional numbness radiating to palmar surface R hand   POSTURE: Forward head, rounded shoulders  CERVICAL ROM:  AROM eval 11/16/22  Cervical Flexion 15 20  Cervical Extension 20 20  Cervical Rotation to Left 30 35  Cervical Rotation to Right 45 45  Left Sidebend 15 25  Right Sidebend 10 15    UPPER EXTREMITY ROM:   Active ROM Right eval Left eval Right 11/16/2022  Shoulder flexion 85 125 110  Shoulder extension 55 65 64  Shoulder abduction 85 115 105  Shoulder adduction     Shoulder internal rotation 60 70   Shoulder external rotation 50 65   (Blank rows = not tested)  UPPER EXTREMITY MMT:  MMT Right eval Left eval  Shoulder flexion 4+ 5  Shoulder extension    Shoulder abduction 4+ 5  Shoulder adduction    Shoulder internal rotation 4+ 5  Shoulder external rotation 4+ 5  Elbow flexion 5 5  Elbow extension 5 5  Wrist flexion 5 5  Wrist extension 5 5  Grip strength (lbs) good good  (Blank rows = not tested)  SHOULDER SPECIAL TESTS: NT  JOINT MOBILITY TESTING:  NT  PALPATION:  Tightness R upper trap, R levator scapulae, cervical paraspinals, R deltoid and bicips   TODAY'S TREATMENT:                                                                                                                                         DATE:   11/30/22 Therapeutic Exercise: to improve strength and mobility.  Demo, verbal and tactile cues throughout for technique. Nustep L5x 6 min Standing hip flexor stretch Review HEP STM/TPR to bil cervical paraspinals/suboccipitals, R UT and L/S, NAGs into rotation, skilled palpation and monitoring during dry needling. Trigger Point Dry-Needling  Treatment instructions: Expect mild to moderate muscle soreness. S/S of pneumothorax if dry  needled over a lung field, and to seek immediate medical attention should they occur. Patient verbalized understanding of these instructions and education. Patient Consent Given: Yes Education handout provided: Previously provided Muscles treated: R cervical multifidi C4-6, R UT, R L/S Electrical stimulation performed: No Parameters: N/A Treatment response/outcome: Twitch Response Elicited and Palpable Increase in Muscle Length   11/25/22 Therapeutic Exercise: to improve strength and mobility.  Demo, verbal and tactile cues throughout for technique. Nustep L5 x 7 min  Seated hip stretches - piriformis, figure 4, hip flexor stretch bil Manual Therapy: to decrease muscle spasm and pain and improve mobility STM/TPR to bil cervical paraspinals R UT and L/S, R biceps.   11/23/22 Therapeutic Exercise: to improve strength and mobility.  Demo, verbal and tactile cues throughout for technique.  Bike L3 x 7 min Cervical SNAGs into flexion and rotation Upper trap stretch Manual Therapy: to decrease muscle spasm and pain and improve mobility STM/TPR to bil cervical paraspinals, R UT and L/S, NAGs into rotation, skilled palpation and monitoring during dry needling. Trigger Point Dry-Needling  Treatment instructions: Expect mild to moderate muscle soreness.  S/S of pneumothorax if dry needled over a lung field, and to seek immediate medical attention should they occur. Patient verbalized understanding of these instructions and education. Patient Consent Given: Yes Education handout provided: Previously provided Muscles treated: bil cervical multifidi C3-6, R UT, R L/S Electrical stimulation performed: No Parameters: N/A Treatment response/outcome: Twitch Response Elicited and Palpable Increase in Muscle Length  PATIENT EDUCATION: Education details: HEP update 10/15/22, 11/16/22 Person educated: Patient Education method: Explanation, Demonstration, Verbal cues, and Handouts Education comprehension:  verbalized understanding and returned demonstration  HOME EXERCISE PROGRAM: Access Code: [email or web address] URL: [email or web address] Date: 11/16/2022 Prepared by: Glenetta Hew  Exercises - Seated Scapular Retraction  - 3 x daily - 7 x weekly - 1 sets - 5 reps - 5 sec hold - Standing Isometric Shoulder Internal Rotation at Doorway  - 3 x daily - 7 x weekly - 1 sets - 5 reps - 5 sec  hold - Isometric Shoulder External Rotation at Wall  - 3 x daily - 7 x weekly - 1 sets - 5 reps - 5 sec  hold - Isometric Shoulder Extension at Wall  - 3 x daily - 7 x weekly - 1 sets - 5 reps - 5 sec hold - Isometric Shoulder Adduction  - 1 x daily - 7 x weekly - 3 sets - 10 reps - Seated Shoulder Flexion Towel Slide at Table Top  - 1 x daily - 7 x weekly - 2 sets - 10 reps - Seated Shoulder Towel Slides Scaption at Table  - 1 x daily - 7 x weekly - 3 sets - 10 reps - Gentle Levator Scapulae Stretch  - 3 x daily - 7 x weekly - 1 sets - 3 reps - 15 sec  hold - Shoulder extension with resistance - Neutral  - 1 x daily - 7 x weekly - 2 sets - 10 reps - Scapular Retraction with Resistance  - 1 x daily - 7 x weekly - 2 sets - 10 reps - Seated Assisted Cervical Rotation with Towel  - 2 x daily - 7 x weekly - 1 sets - 10 reps - Cervical Extension AROM with Strap  - 2 x daily - 7 x weekly - 1 sets - 10 reps - Sidelying Shoulder Abduction Palm Forward  - 1 x daily - 7 x weekly - 2 sets - 10 reps - Sidelying Shoulder Flexion 15 Degrees  - 1 x daily - 7 x weekly - 2 sets - 10 reps - Sidelying Shoulder External Rotation  - 1 x daily - 7 x weekly - 2 sets - 10 reps  ASSESSMENT:  CLINICAL IMPRESSION: Philippa Pilgreen reports worsened dizziness recently, noted increased tightness in suboccipitals and cervical paraspinals, so focused on manual therapy to cervical region today to decrease pain and dizziness.  Noted decreased tension following.  Since the distance of her sports medicine doctor was causing her a lot of  anxiety and she did not feel safe driving herself there, did give recommendation for Dr. Raeford Razor next door for follow-up, as she can drive herself here safely.   Johari Choquette continues to demonstrate potential for improvement and would benefit from continued skilled therapy to address impairments.       OBJECTIVE IMPAIRMENTS: decreased activity tolerance, decreased endurance, decreased mobility, decreased ROM, decreased strength, increased fascial restrictions, increased muscle spasms, impaired flexibility, impaired UE functional use, postural dysfunction, and pain.   ACTIVITY LIMITATIONS: carrying, lifting, sleeping, bed mobility, dressing, reach over head, and hygiene/grooming  PARTICIPATION LIMITATIONS:  sewing  PERSONAL FACTORS: Age, Fitness, Past/current experiences, and 3+ comorbidities: history neck pain/surgery, tinnitis, chronic LBP, recent COVID infection with brain fog, glaucoma, obesity, bil knee OA, T2DM, vertigo  are also affecting patient's functional outcome.   REHAB POTENTIAL: Good  CLINICAL DECISION MAKING: Evolving/moderate complexity  EVALUATION COMPLEXITY: Moderate   GOALS: Goals reviewed with patient? Yes  SHORT TERM GOALS: Target date: 10/14/2022   Patient will be independent with initial HEP.  Baseline: needs Goal status: MET 10/06/22- HEP given.  10/15/22- initial HEP modified 11/02/22- cues still needed.  11/16/22- reports good compliance.    LONG TERM GOALS: Target date: 11/11/2022 extended to 12/14/22  Patient will be independent with advanced/ongoing HEP to improve outcomes and carryover.  Baseline:  Goal status: IN PROGRESS 11/16/22- met for current.   2.  Patient will report 75% improvement in Right shoulder pain to improve QOL.  Baseline:  Goal status: IN PROGRESS 11/16/22 - 60% improvement   3. Patient to improve R shoulder AROM to [facility] without pain provocation to allow for increased ease of ADLs.  Baseline: see objective  Goal status: IN PROGRESS  11/16/22- improved, see objective,   4.  Patient will demonstrate improved functional UE strength as demonstrated by 5/5 R shoulder strength. Baseline: 4+/5 limited by pain Goal status: IN PROGRESS 11/16/22- still painful, but 5/5  5.  Patient will report <40% impairment on QuickDash to demonstrate improved functional ability.  Baseline: 47.7% Goal status: MET 11/16/22 - 25%   PLAN:  PT FREQUENCY: 2x/week  PT DURATION: 4 weeks  PLANNED INTERVENTIONS: Therapeutic exercises, Therapeutic activity, Neuromuscular re-education, Balance training, Gait training, Patient/Family education, Self Care, Joint mobilization, Dry Needling, Electrical stimulation, Spinal mobilization, Cryotherapy, Moist heat, Taping, Traction, Ultrasound, Ionotophoresis 4mg /ml Dexamethasone, Manual therapy, and Re-evaluation  PLAN FOR NEXT SESSION: postural strengthening, stretches as tolerated, manual therapy and TrDN, modalities PRN.    Rennie Natter, PT, DPT  11/30/2022, 3:12 PM

## 2022-11-30 NOTE — Telephone Encounter (Signed)
Pt called, appt scheduled for 12/03/2022.

## 2022-12-01 ENCOUNTER — Encounter: Payer: Self-pay | Admitting: Family Medicine

## 2022-12-01 ENCOUNTER — Other Ambulatory Visit: Payer: Self-pay

## 2022-12-01 ENCOUNTER — Ambulatory Visit (INDEPENDENT_AMBULATORY_CARE_PROVIDER_SITE_OTHER): Payer: Medicare HMO | Admitting: Family Medicine

## 2022-12-01 ENCOUNTER — Ambulatory Visit (HOSPITAL_BASED_OUTPATIENT_CLINIC_OR_DEPARTMENT_OTHER)
Admission: RE | Admit: 2022-12-01 | Discharge: 2022-12-01 | Disposition: A | Discharge status: Home / Self Care | Payer: Medicare HMO | Source: Ambulatory Visit | Attending: Family Medicine | Admitting: Family Medicine

## 2022-12-01 VITALS — BP 150/90 | Ht 67.0 in | Wt 247.0 lb

## 2022-12-01 DIAGNOSIS — M169 Osteoarthritis of hip, unspecified: Secondary | ICD-10-CM | POA: Insufficient documentation

## 2022-12-01 DIAGNOSIS — M19011 Primary osteoarthritis, right shoulder: Secondary | ICD-10-CM | POA: Diagnosis not present

## 2022-12-01 DIAGNOSIS — M25552 Pain in left hip: Secondary | ICD-10-CM | POA: Diagnosis not present

## 2022-12-01 DIAGNOSIS — M25852 Other specified joint disorders, left hip: Secondary | ICD-10-CM

## 2022-12-01 DIAGNOSIS — M25511 Pain in right shoulder: Secondary | ICD-10-CM | POA: Diagnosis not present

## 2022-12-01 MED ORDER — TRIAMCINOLONE ACETONIDE 40 MG/ML IJ SUSP
40.0000 mg | Freq: Once | INTRAMUSCULAR | Status: AC
Start: 2022-12-01 — End: 2022-12-01
  Administered 2022-12-01: 40 mg via INTRA_ARTICULAR

## 2022-12-01 NOTE — Patient Instructions (Signed)
Nice to meet you Please try ice on the hip  We'll call with the results from today   Please send me a message in [place] with any questions or updates.  Please see me back in 4 weeks.   --Dr. Raeford Razor

## 2022-12-01 NOTE — Assessment & Plan Note (Signed)
Acutely occurring with symptoms more consistent with a joint origin.  She is having pain and limping.  No injury inciting event -Counseled on home exercise therapy and supportive care. -Injection. -X-ray. -Could consider further imaging

## 2022-12-01 NOTE — Progress Notes (Signed)
Brenda Mcfarland - 77 y.o. female MRN [id number]  Date of birth: Jan 26, 1946  SUBJECTIVE:  Including CC & ROS.  No chief complaint on file.   Brenda Mcfarland is a 77 y.o. female that is presenting with acute on chronic right shoulder pain and acute left hip pain.  She has been in physical therapy and has plateaued with her right shoulder therapy.  The left hip is occurring in the anterior lateral and posterior aspect of the hip.  No injury or inciting event.    Review of Systems See HPI   HISTORY: Past Medical, Surgical, Social, and Family History Reviewed & Updated per EMR.   Pertinent Historical Findings include:  Past Medical History:  Diagnosis Date   Abnormal EKG 10/23/2016   Arthritis 01/18/2020   Chronic fatigue 10/08/2015   Depression 01/18/2020   Diabetes mellitus 10/08/2015   Formatting of this note might be different from the original. Overview:  last Hgb A1C 7.2/no blood sugars at home Formatting of this note might be different from the original. last Hgb A1C 7.2/no blood sugars at home   ESR raised 11/27/2016   Frequent urination 12/16/2018   Glaucoma 10/08/2015   High cholesterol    History of pancreatitis 07/11/2014   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/23/2016   Hypertension 10/08/2015   IFG (impaired fasting glucose) 10/24/2019   Incomplete emptying of bladder 11/30/2017   Knee pain, left 12/22/2016   Loss of memory 01/18/2020   Major depressive disorder in partial remission 10/08/2015   Morbid obesity 10/23/2016   Obesity (BMI 30-39.9) 11/10/2016   Obstructive sleep apnea 12/16/2018   Osteoarthritis of both knees 11/27/2016   Other insomnia 03/05/2017   Palpitations 10/23/2016   Positive ANA (antinuclear antibody) 10/23/2016   Sleep apnea 10/08/2015   Formatting of this note might be different from the original. Overview:  does not use CPAP Formatting of this note might be different from the original. does not use CPAP   Sore neck 10/24/2019   Spondylosis of cervical spine with myelopathy 01/18/2020    Type 2 diabetes mellitus without complication, without long-term current use of insulin 12/16/2018   Vitamin D deficiency 10/08/2015    Past Surgical History:  Procedure Laterality Date   APPENDECTOMY     age 14   CESAREAN SECTION     X 3   NECK SURGERY  2015   SPINE SURGERY  05/29/2015     PHYSICAL EXAM:  VS: BP (!) 150/90 (BP Location: Left Arm, Patient Position: Sitting)   Ht 5\' 7"  (1.702 m)   Wt 247 lb (112 kg)   BMI 38.69 kg/m  Physical Exam Gen: NAD, alert, cooperative with exam, well-appearing MSK:  Neurovascularly intact    Limited ultrasound: Right shoulder pain:  Ongoing bursitis of the anterior aspect of the shoulder joint. Chronic changes appreciated supraspinatus.  Summary: These consistent with ongoing bursitis of the shoulder  Ultrasound and interpretation by Clearance Coots, MD  Aspiration/Injection Procedure Note Brenda Mcfarland [birth year]  Procedure: Injection Indications: Left hip pain  Procedure Details Consent: Risks of procedure as well as the alternatives and risks of each were explained to the (patient/caregiver).  Consent for procedure obtained. Time Out: Verified patient identification, verified procedure, site/side was marked, verified correct patient position, special equipment/implants available, medications/allergies/relevent history reviewed, required imaging and test results available.  Performed.  The area was cleaned with iodine and alcohol swabs.    The left hip joint was injected using 4 cc of 1% lidocaine and 0.4 cc of 8.4% sodium  bicarbonate on a 22-gauge 3-1/2 inch needle.  The syringe was switched to mixture containing 1 cc's of 40 mg Kenalog and 4 cc's of 0.25% bupivacaine was injected.  Ultrasound was used. Images were obtained in short views showing the injection.     A sterile dressing was applied.  Patient did tolerate procedure well.     ASSESSMENT & PLAN:   Hip impingement syndrome, left Acutely occurring with symptoms  more consistent with a joint origin.  She is having pain and limping.  No injury inciting event -Counseled on home exercise therapy and supportive care. -Injection. -X-ray. -Could consider further imaging  Primary osteoarthritis of right shoulder Acute on chronic in nature.  Continues to have pain and ongoing bursitis appreciated.  Has tried a subacromial injection with physical therapy.  Unclear if this is more of an inflammatory versus joint origin. -Counseled on home exercise therapy and supportive care. -X-ray -Can consider aspiration with analysis. -Consider further imaging

## 2022-12-01 NOTE — Assessment & Plan Note (Addendum)
Acute on chronic in nature.  Continues to have pain and ongoing bursitis appreciated.  Has tried a subacromial injection with physical therapy.  Unclear if this is more of an inflammatory versus joint origin. -Counseled on home exercise therapy and supportive care. -X-ray -Can consider aspiration with analysis. -Consider further imaging

## 2022-12-02 ENCOUNTER — Encounter: Payer: Self-pay | Admitting: Family Medicine

## 2022-12-02 ENCOUNTER — Other Ambulatory Visit: Payer: Self-pay

## 2022-12-02 ENCOUNTER — Ambulatory Visit (INDEPENDENT_AMBULATORY_CARE_PROVIDER_SITE_OTHER): Payer: Medicare HMO | Admitting: Family Medicine

## 2022-12-02 VITALS — BP 180/90 | Ht 67.0 in | Wt 247.0 lb

## 2022-12-02 DIAGNOSIS — M19011 Primary osteoarthritis, right shoulder: Secondary | ICD-10-CM | POA: Diagnosis not present

## 2022-12-02 MED ORDER — METHYLPREDNISOLONE ACETATE 40 MG/ML IJ SUSP
40.0000 mg | Freq: Once | INTRAMUSCULAR | Status: AC
Start: 2022-12-02 — End: 2022-12-02
  Administered 2022-12-02: 40 mg via INTRA_ARTICULAR

## 2022-12-02 NOTE — Patient Instructions (Signed)
Good to see you Please use ice as needed  We'll call with the results.   Please send me a message in MyChart with any questions or updates.  Please see me back in 4 weeks.   --Dr. Raeford Razor

## 2022-12-02 NOTE — Progress Notes (Signed)
Brenda Mcfarland - 77 y.o. female MRN [id number]  Date of birth: 10-Sep-1945  SUBJECTIVE:  Including CC & ROS.  No chief complaint on file.   Brenda Mcfarland is a 77 y.o. female that is here for a shoulder injection.    Review of Systems See HPI   HISTORY: Past Medical, Surgical, Social, and Family History Reviewed & Updated per EMR.   Pertinent Historical Findings include:  Past Medical History:  Diagnosis Date   Abnormal EKG 10/23/2016   Arthritis 01/18/2020   Chronic fatigue 10/08/2015   Depression 01/18/2020   Diabetes mellitus 10/08/2015   Formatting of this note might be different from the original. Overview:  last Hgb A1C 7.2/no blood sugars at home Formatting of this note might be different from the original. last Hgb A1C 7.2/no blood sugars at home   ESR raised 11/27/2016   Frequent urination 12/16/2018   Glaucoma 10/08/2015   High cholesterol    History of pancreatitis 07/11/2014   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/23/2016   Hypertension 10/08/2015   IFG (impaired fasting glucose) 10/24/2019   Incomplete emptying of bladder 11/30/2017   Knee pain, left 12/22/2016   Loss of memory 01/18/2020   Major depressive disorder in partial remission 10/08/2015   Morbid obesity 10/23/2016   Obesity (BMI 30-39.9) 11/10/2016   Obstructive sleep apnea 12/16/2018   Osteoarthritis of both knees 11/27/2016   Other insomnia 03/05/2017   Palpitations 10/23/2016   Positive ANA (antinuclear antibody) 10/23/2016   Sleep apnea 10/08/2015   Formatting of this note might be different from the original. Overview:  does not use CPAP Formatting of this note might be different from the original. does not use CPAP   Sore neck 10/24/2019   Spondylosis of cervical spine with myelopathy 01/18/2020   Type 2 diabetes mellitus without complication, without long-term current use of insulin 12/16/2018   Vitamin D deficiency 10/08/2015    Past Surgical History:  Procedure Laterality Date   APPENDECTOMY     age 42   CESAREAN SECTION     X 3    NECK SURGERY  2015   SPINE SURGERY  05/29/2015     PHYSICAL EXAM:  VS: BP (!) 180/90 (BP Location: Right Arm, Patient Position: Sitting)   Ht 5\' 7"  (1.702 m)   Wt 247 lb (112 kg)   BMI 38.69 kg/m  Physical Exam Gen: NAD, alert, cooperative with exam, well-appearing MSK:  Neurovascularly intact     Aspiration/Injection Procedure Note Brenda Mcfarland 07-Dec-1945  Procedure: Injection Indications: Right shoulder pain  Procedure Details Consent: Risks of procedure as well as the alternatives and risks of each were explained to the (patient/caregiver).  Consent for procedure obtained. Time Out: Verified patient identification, verified procedure, site/side was marked, verified correct patient position, special equipment/implants available, medications/allergies/relevent history reviewed, required imaging and test results available.  Performed.  The area was cleaned with iodine and alcohol swabs.    The right bicipital tendon sheath  was injected using 4 cc of 1% lidocaine and 0.4 cc of 8.4% sodium bicarbonate on a 22-gauge 1-1/2 inch needle.  The syringe was switched to mixture containing 1 cc's of 40 mg Depo-Medrol and 4 cc's of 0.25% bupivacaine was injected.  Ultrasound was used. Images were obtained in long views showing the injection.     A sterile dressing was applied.  Patient did tolerate procedure well.     ASSESSMENT & PLAN:   Primary osteoarthritis of right shoulder Acute on chronic in nature.  The overlying bursitis had  improved but still present.  Unable to aspirate any significant amount of fluid today. -Injection today.

## 2022-12-02 NOTE — Assessment & Plan Note (Signed)
Acute on chronic in nature.  The overlying bursitis had improved but still present.  Unable to aspirate any significant amount of fluid today. -Injection today.

## 2022-12-03 ENCOUNTER — Ambulatory Visit: Payer: Medicare HMO | Admitting: Family Medicine

## 2022-12-03 ENCOUNTER — Ambulatory Visit: Payer: Medicare HMO | Admitting: Physical Therapy

## 2022-12-04 ENCOUNTER — Telehealth: Payer: Self-pay | Admitting: Family Medicine

## 2022-12-04 NOTE — Telephone Encounter (Signed)
Informed of results.   Myra Rude, MD [facility] 12/04/2022, 12:22 PM

## 2022-12-07 ENCOUNTER — Ambulatory Visit: Payer: Medicare HMO | Admitting: Physical Therapy

## 2022-12-07 ENCOUNTER — Encounter: Payer: Self-pay | Admitting: Physical Therapy

## 2022-12-07 DIAGNOSIS — M25511 Pain in right shoulder: Secondary | ICD-10-CM | POA: Diagnosis not present

## 2022-12-07 DIAGNOSIS — R293 Abnormal posture: Secondary | ICD-10-CM

## 2022-12-07 DIAGNOSIS — R252 Cramp and spasm: Secondary | ICD-10-CM | POA: Diagnosis not present

## 2022-12-07 DIAGNOSIS — M6281 Muscle weakness (generalized): Secondary | ICD-10-CM | POA: Diagnosis not present

## 2022-12-07 NOTE — Therapy (Addendum)
OUTPATIENT PHYSICAL THERAPY Treatment  Patient Name: Brenda Mcfarland MRN: [id number] DOB:05-03-1946, 77 y.o., female Today's Date: 12/07/2022  END OF SESSION:  PT End of Session - 12/07/22 1312     Visit Number 14    Number of Visits 17    Date for PT Re-Evaluation 12/14/22    Authorization Type [employer] MCR + State BCBS    Progress Note Due on Visit 17    PT Start Time 1315    PT Stop Time 1358    PT Time Calculation (min) 43 min    Activity Tolerance Patient tolerated treatment well    Behavior During Therapy WFL for tasks assessed/performed             Past Medical History:  Diagnosis Date   Abnormal EKG 10/23/2016   Arthritis 01/18/2020   Chronic fatigue 10/08/2015   Depression 01/18/2020   Diabetes mellitus 10/08/2015   Formatting of this note might be different from the original. Overview:  last Hgb A1C 7.2/no blood sugars at home Formatting of this note might be different from the original. last Hgb A1C 7.2/no blood sugars at home   ESR raised 11/27/2016   Frequent urination 12/16/2018   Glaucoma 10/08/2015   High cholesterol    History of pancreatitis 07/11/2014   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/23/2016   Hypertension 10/08/2015   IFG (impaired fasting glucose) 10/24/2019   Incomplete emptying of bladder 11/30/2017   Knee pain, left 12/22/2016   Loss of memory 01/18/2020   Major depressive disorder in partial remission 10/08/2015   Morbid obesity 10/23/2016   Obesity (BMI 30-39.9) 11/10/2016   Obstructive sleep apnea 12/16/2018   Osteoarthritis of both knees 11/27/2016   Other insomnia 03/05/2017   Palpitations 10/23/2016   Positive ANA (antinuclear antibody) 10/23/2016   Sleep apnea 10/08/2015   Formatting of this note might be different from the original. Overview:  does not use CPAP Formatting of this note might be different from the original. does not use CPAP   Sore neck 10/24/2019   Spondylosis of cervical spine with myelopathy 01/18/2020   Type 2 diabetes mellitus without complication, without  long-term current use of insulin 12/16/2018   Vitamin D deficiency 10/08/2015   Past Surgical History:  Procedure Laterality Date   APPENDECTOMY     age 48   CESAREAN SECTION     X 3   NECK SURGERY  2015   SPINE SURGERY  05/29/2015   Patient Active Problem List   Diagnosis Date Noted   Primary osteoarthritis of right shoulder 12/01/2022   Hip impingement syndrome, left 12/01/2022   Nocturia 05/15/2022   Paroxysmal SVT (supraventricular tachycardia) 01/22/2020   Arthritis 01/18/2020   Depression 01/18/2020   Loss of memory 01/18/2020   Spondylosis of cervical spine with myelopathy 01/18/2020   IFG (impaired fasting glucose) 10/24/2019   Frequent urination 12/16/2018   Obstructive sleep apnea 12/16/2018   Type 2 diabetes mellitus without complication, without long-term current use of insulin 12/16/2018   Incomplete emptying of bladder 11/30/2017   Insomnia 03/05/2017   ESR raised 11/27/2016   Osteoarthritis of both knees 11/27/2016   Obesity (BMI 30-39.9) 11/10/2016   Abnormal EKG 10/23/2016   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/23/2016   Morbid obesity 10/23/2016   Palpitations 10/23/2016   Positive ANA (antinuclear antibody) 10/23/2016   Hypertension 10/08/2015   Chronic fatigue 10/08/2015   Diabetes mellitus 10/08/2015   Glaucoma 10/08/2015   Major depressive disorder in partial remission 10/08/2015   Vitamin D deficiency 10/08/2015   Sleep  apnea 10/08/2015   History of pancreatitis 07/11/2014    PCP: Brenda Mcfarland  REFERRING PROVIDER: Rodolph Bongorey, Evan S, Mcfarland   REFERRING DIAG: [phone] [id number],G89.29 (ICD-10-CM) - Chronic right shoulder pain M62.838 (ICD-10-CM) - Trapezius muscle spasm  THERAPY DIAG:  Acute pain of right shoulder  Abnormal posture  Cramp and spasm  Muscle weakness (generalized)  Rationale for Evaluation and Treatment: Rehabilitation  ONSET DATE: 08/13/23  SUBJECTIVE:                                                                                                                                                                                       SUBJECTIVE STATEMENT: Brenda Mcfarland saw Brenda Mcfarland, saw large fluid filled area in shoulder, thought that was why she wasn't getting better.  He gave her a shot in her hip, had her return to the next day to aspirate, the next day it had improved, he was unable to aspirate fluid, so given cortisone injection instead.  Having more vertigo though.   NEXT Mcfarland VISIT:  10/22/2022  PERTINENT HISTORY: history neck pain/surgery, tinnitis, chronic LBP, recent COVID infection with brain fog, glaucoma, obesity, bil knee OA, T2DM, vertigo  PAIN:  Are you having pain? Yes: NPRS scale: 1/10 Pain location: R upper shoulder/neck   Pain description: radiates down to elbow, shooting, throbbing Aggravating factors: moving arm, raising overhead, putting on bra Relieving factors: keeping it still, pain creme, heat   PRECAUTIONS: None  WEIGHT BEARING RESTRICTIONS: No  FALLS:  Has patient fallen in last 6 months? Yes. Number of falls 1 going up stairs, tripped  LIVING ENVIRONMENT: Lives with: lives alone Lives in: House/apartment Stairs: Yes: Internal: 2 steps; on right going up Has following equipment at home: None  OCCUPATION: Retired   PLOF: Independent  PATIENT GOALS:decrease shoulder pain  NEXT Mcfarland VISIT:  10/22/2022  OBJECTIVE:   DIAGNOSTIC FINDINGS:  09/11/2022 Limited diagnostic ultrasound: Very large subacromial and subdeltoid bursitis are present. Rotator cuff tendons appear to be intact without large rotator cuff retracted tear. Accuracy of ultrasound limited by body habitus  10/22/22  DG cervical spine. FINDINGS: The cervical spine is only well visualized to the bottom of C6 on lateral imaging. Anterior plate and screw fusion from C3 through C6 remains. Hardware is in good position. No malalignment. Pre odontoid space and prevertebral soft tissues are normal. Mild degenerative disc disease at C2-3.  Uncovertebral degenerative changes identified, right greater than left. No other bony or soft tissue abnormalities are noted.  PATIENT SURVEYS:  Quick Dash 47.7%  COGNITION: Overall cognitive status: Within functional limits for tasks assessed     SENSATION: WFL reports occasional numbness radiating to  palmar surface R hand   POSTURE: Forward head, rounded shoulders  CERVICAL ROM:  AROM eval 11/16/22  Cervical Flexion 15 20  Cervical Extension 20 20  Cervical Rotation to Left 30 35  Cervical Rotation to Right 45 45  Left Sidebend 15 25  Right Sidebend 10 15    UPPER EXTREMITY ROM:   Active ROM Right eval Left eval Right 11/16/2022  Shoulder flexion 85 125 110  Shoulder extension 55 65 64  Shoulder abduction 85 115 105  Shoulder adduction     Shoulder internal rotation 60 70   Shoulder external rotation 50 65   (Blank rows = not tested)  UPPER EXTREMITY MMT:  MMT Right eval Left eval  Shoulder flexion 4+ 5  Shoulder extension    Shoulder abduction 4+ 5  Shoulder adduction    Shoulder internal rotation 4+ 5  Shoulder external rotation 4+ 5  Elbow flexion 5 5  Elbow extension 5 5  Wrist flexion 5 5  Wrist extension 5 5  Grip strength (lbs) good good  (Blank rows = not tested)  SHOULDER SPECIAL TESTS: NT  JOINT MOBILITY TESTING:  NT  PALPATION:  Tightness R upper trap, R levator scapulae, cervical paraspinals, R deltoid and bicips   TODAY'S TREATMENT:                                                                                                                                         DATE:  12/07/22 Therapeutic Exercise: to improve strength and mobility.  Demo, verbal and tactile cues throughout for technique. Nustep L5 x 10 min (started 9 min prior to session) Manual Therapy: to decrease muscle spasm and pain and improve mobility STM/TPR to bil cervical paraspinals/suboccipitals, R UT and L/S, R anterior scalenes, NAGs into rotation, MWM, skilled  palpation and monitoring during dry needling. Trigger Point Dry-Needling  Treatment instructions: Expect mild to moderate muscle soreness. S/S of pneumothorax if dry needled over a lung field, and to seek immediate medical attention should they occur. Patient verbalized understanding of these instructions and education. Patient Consent Given: Yes Education handout provided: Previously provided Muscles treated: R cervical multifidi C4-6, R UT Electrical stimulation performed: No Parameters: N/A Treatment response/outcome: Twitch Response Elicited and Palpable Increase in Muscle Length Neuromuscular Reeducation: to decrease dizziness.  VOR x 1 seated exercises for habituation 3 x 10 vertical, 3 x 10 horizontal   11/30/22 Therapeutic Exercise: to improve strength and mobility.  Demo, verbal and tactile cues throughout for technique. Nustep L5x 6 min Standing hip flexor stretch Review HEP Manual Therapy: to decrease muscle spasm and pain and improve mobility STM/TPR to bil cervical paraspinals/suboccipitals, R UT and L/S, NAGs into rotation, skilled palpation and monitoring during dry needling. Trigger Point Dry-Needling  Treatment instructions: Expect mild to moderate muscle soreness. S/S of pneumothorax if dry needled over a lung field, and to seek immediate medical attention should  they occur. Patient verbalized understanding of these instructions and education. Patient Consent Given: Yes Education handout provided: Previously provided Muscles treated: R cervical multifidi C4-6, R UT, R L/S Electrical stimulation performed: No Parameters: N/A Treatment response/outcome: Twitch Response Elicited and Palpable Increase in Muscle Length   11/25/22 Therapeutic Exercise: to improve strength and mobility.  Demo, verbal and tactile cues throughout for technique. Nustep L5 x 7 min  Seated hip stretches - piriformis, figure 4, hip flexor stretch bil Manual Therapy: to decrease muscle spasm and pain  and improve mobility STM/TPR to bil cervical paraspinals R UT and L/S, R biceps.    PATIENT EDUCATION: Education details: HEP update 10/15/22, 11/16/22 Person educated: Patient Education method: Explanation, Demonstration, Verbal cues, and Handouts Education comprehension: verbalized understanding and returned demonstration  HOME EXERCISE PROGRAM: Access Code: [email or web address] URL: [email or web address] Date: 11/16/2022 Prepared by: Harrie Foreman  Exercises - Seated Scapular Retraction  - 3 x daily - 7 x weekly - 1 sets - 5 reps - 5 sec hold - Standing Isometric Shoulder Internal Rotation at Doorway  - 3 x daily - 7 x weekly - 1 sets - 5 reps - 5 sec  hold - Isometric Shoulder External Rotation at Wall  - 3 x daily - 7 x weekly - 1 sets - 5 reps - 5 sec  hold - Isometric Shoulder Extension at Wall  - 3 x daily - 7 x weekly - 1 sets - 5 reps - 5 sec hold - Isometric Shoulder Adduction  - 1 x daily - 7 x weekly - 3 sets - 10 reps - Seated Shoulder Flexion Towel Slide at Table Top  - 1 x daily - 7 x weekly - 2 sets - 10 reps - Seated Shoulder Towel Slides Scaption at Table  - 1 x daily - 7 x weekly - 3 sets - 10 reps - Gentle Levator Scapulae Stretch  - 3 x daily - 7 x weekly - 1 sets - 3 reps - 15 sec  hold - Shoulder extension with resistance - Neutral  - 1 x daily - 7 x weekly - 2 sets - 10 reps - Scapular Retraction with Resistance  - 1 x daily - 7 x weekly - 2 sets - 10 reps - Seated Assisted Cervical Rotation with Towel  - 2 x daily - 7 x weekly - 1 sets - 10 reps - Cervical Extension AROM with Strap  - 2 x daily - 7 x weekly - 1 sets - 10 reps - Sidelying Shoulder Abduction Palm Forward  - 1 x daily - 7 x weekly - 2 sets - 10 reps - Sidelying Shoulder Flexion 15 Degrees  - 1 x daily - 7 x weekly - 2 sets - 10 reps - Sidelying Shoulder External Rotation  - 1 x daily - 7 x weekly - 2 sets - 10 reps  ASSESSMENT:  CLINICAL IMPRESSION: Tanzie Rothschild reports improved R  shoulder pain following cortisone injection, rating pain only 1/10 arriving today, but after 10 min on Nustep (came in a lunch and got onto nustep prior to therapist arriving) rated pain 4/10 from overdoing. Today focused on manual therapy to R cervical paraspinals, UT, and anterior scalenes as still has multiple trigger/tendern points in this region.  She is also reporting continued dizziness, noted nystagmus at rest, reviewed VOR exercises that she had been given previous episodes in PT, afterwards improved visual fixation and less nystagmus.  Brenda Mcfarland continues to demonstrate potential for improvement and would  benefit from continued skilled therapy to address impairments.      OBJECTIVE IMPAIRMENTS: decreased activity tolerance, decreased endurance, decreased mobility, decreased ROM, decreased strength, increased fascial restrictions, increased muscle spasms, impaired flexibility, impaired UE functional use, postural dysfunction, and pain.   ACTIVITY LIMITATIONS: carrying, lifting, sleeping, bed mobility, dressing, reach over head, and hygiene/grooming  PARTICIPATION LIMITATIONS:  sewing  PERSONAL FACTORS: Age, Fitness, Past/current experiences, and 3+ comorbidities: history neck pain/surgery, tinnitis, chronic LBP, recent COVID infection with brain fog, glaucoma, obesity, bil knee OA, T2DM, vertigo  are also affecting patient's functional outcome.   REHAB POTENTIAL: Good  CLINICAL DECISION MAKING: Evolving/moderate complexity  EVALUATION COMPLEXITY: Moderate   GOALS: Goals reviewed with patient? Yes  SHORT TERM GOALS: Target date: 10/14/2022   Patient will be independent with initial HEP.  Baseline: needs Goal status: MET 10/06/22- HEP given.  10/15/22- initial HEP modified 11/02/22- cues still needed.  11/16/22- reports good compliance.    LONG TERM GOALS: Target date: 11/11/2022 extended to 12/14/22  Patient will be independent with advanced/ongoing HEP to improve outcomes and  carryover.  Baseline:  Goal status: IN PROGRESS 11/16/22- met for current.   2.  Patient will report 75% improvement in Right shoulder pain to improve QOL.  Baseline:  Goal status: IN PROGRESS 11/16/22 - 60% improvement   3. Patient to improve R shoulder AROM to [facility] without pain provocation to allow for increased ease of ADLs.  Baseline: see objective  Goal status: IN PROGRESS 11/16/22- improved, see objective,   4.  Patient will demonstrate improved functional UE strength as demonstrated by 5/5 R shoulder strength. Baseline: 4+/5 limited by pain Goal status: IN PROGRESS 11/16/22- still painful, but 5/5  5.  Patient will report <40% impairment on QuickDash to demonstrate improved functional ability.  Baseline: 47.7% Goal status: MET 11/16/22 - 25%   PLAN:  PT FREQUENCY: 2x/week  PT DURATION: 4 weeks  PLANNED INTERVENTIONS: Therapeutic exercises, Therapeutic activity, Neuromuscular re-education, Balance training, Gait training, Patient/Family education, Self Care, Joint mobilization, Dry Needling, Electrical stimulation, Spinal mobilization, Cryotherapy, Moist heat, Taping, Traction, Ultrasound, Ionotophoresis 4mg /ml Dexamethasone, Manual therapy, and Re-evaluation  PLAN FOR NEXT SESSION: postural strengthening, stretches as tolerated, manual therapy and TrDN, modalities PRN.    Jena Gauss, PT, DPT  12/07/2022, 2:03 PM   PHYSICAL THERAPY DISCHARGE SUMMARY  Visits from Start of Care: 14  Current functional level related to goals / functional outcomes: Decreased R shoulder pain   Remaining deficits: Vertigo, neck pain, hip pain   Education / Equipment: HEP  Plan: Patient goals were not met.  Patient is being discharged due to not returning to skilled therapy since 12/07/2022.  Since it has been 30+ days since being seen, she would require new order to return.      Jena Gauss, PT  01/14/2023 3:11 PM

## 2022-12-09 ENCOUNTER — Ambulatory Visit: Payer: Medicare HMO | Admitting: Physical Therapy

## 2022-12-14 ENCOUNTER — Encounter: Payer: Self-pay | Admitting: *Deleted

## 2022-12-14 ENCOUNTER — Ambulatory Visit: Payer: Medicare HMO | Admitting: Physical Therapy

## 2022-12-14 DIAGNOSIS — G4733 Obstructive sleep apnea (adult) (pediatric): Secondary | ICD-10-CM | POA: Diagnosis not present

## 2022-12-17 ENCOUNTER — Telehealth: Payer: Self-pay | Admitting: *Deleted

## 2022-12-17 ENCOUNTER — Ambulatory Visit (INDEPENDENT_AMBULATORY_CARE_PROVIDER_SITE_OTHER): Payer: Medicare HMO | Admitting: Nurse Practitioner

## 2022-12-17 DIAGNOSIS — Z0000 Encounter for general adult medical examination without abnormal findings: Secondary | ICD-10-CM

## 2022-12-17 NOTE — Patient Instructions (Signed)
  Ms. Karabin , Thank you for taking time to come for your Medicare Wellness Visit. I appreciate your ongoing commitment to your health goals. Please review the following plan we discussed and let me know if I can assist you in the future.   These are the goals we discussed:  Goals   None     This is a list of the screening recommended for you and due dates:  Health Maintenance  Topic Date Due   COVID-19 Vaccine (1) Never done   Complete foot exam   Never done   Eye exam for diabetics  Never done   Hemoglobin A1C  10/30/2022   Yearly kidney health urinalysis for diabetes  12/16/2022   Zoster (Shingles) Vaccine (1 of 2) 03/18/2023*   Pneumonia Vaccine (1 of 1 - PCV) 12/17/2023*   DEXA scan (bone density measurement)  12/17/2023*   Flu Shot  04/01/2023   Yearly kidney function blood test for diabetes  05/02/2023   Medicare Annual Wellness Visit  12/17/2023   DTaP/Tdap/Td vaccine (2 - Td or Tdap) 06/30/2028   Hepatitis C Screening: USPSTF Recommendation to screen - Ages 12-79 yo.  Completed   HPV Vaccine  Aged Out  *Topic was postponed. The date shown is not the original due date.

## 2022-12-17 NOTE — Progress Notes (Signed)
  This service is provided via telemedicine  No vital signs collected/recorded due to the encounter was a telemedicine visit.   Location of patient (ex: home, work):  Home  Patient consents to a telephone visit:  Yes  Location of the provider (ex: office, home):  Office [place].   Name of any referring provider:  na  Names of all persons participating in the telemedicine service and their role in the encounter:  Sabino Niemann, Patient, Nelda Severe, CMA, Abbey Chatters, NP  Time spent on call:  7:15

## 2022-12-17 NOTE — Progress Notes (Signed)
Subjective:   Brenda Mcfarland is a 77 y.o. female who presents for Medicare Annual (Subsequent) preventive examination.  Review of Systems     Cardiac Risk Factors include: advanced age (>20men, >8 women);sedentary lifestyle;obesity (BMI >30kg/m2);family history of premature cardiovascular disease;hypertension;dyslipidemia     Objective:    There were no vitals filed for this visit. There is no height or weight on file to calculate BMI.     12/17/2022   10:20 AM 09/30/2022   11:12 AM 05/01/2022    2:17 PM 05/01/2022    1:35 PM 04/07/2022    2:54 PM 11/17/2021    2:37 PM 07/30/2021   11:30 AM  Advanced Directives  Does Patient Have a Medical Advance Directive? Yes Yes Yes No Yes Yes Yes  Type of Estate agent of [place];Living will Healthcare Power of [place];Living will Healthcare Power of [place];Living will;Out of facility DNR (pink MOST or yellow form)  Healthcare Power of [place];Living will Healthcare Power of [place];Living will Healthcare Power of [place];Living will  Does patient want to make changes to medical advance directive? No - Patient declined No - Patient declined No - Patient declined  No - Patient declined No - Patient declined No - Patient declined  Copy of Healthcare Power of Attorney in Chart? No - copy requested No - copy requested Yes - validated most recent copy scanned in chart (See row information)  No - copy requested No - copy requested Yes - validated most recent copy scanned in chart (See row information)  Pre-existing out of facility DNR order (yellow form or pink MOST form)   Pink MOST/Yellow Form most recent copy in chart - Physician notified to receive inpatient order;Pink MOST form placed in chart (order not valid for inpatient use)        Current Medications (verified) Outpatient Encounter Medications as of 12/17/2022  Medication Sig   amLODipine (NORVASC) 5 MG tablet Take 1 tablet (5 mg total) by mouth every other day.    Ibuprofen 200 MG CAPS Take by mouth as needed.   latanoprost (XALATAN) 0.005 % ophthalmic solution Place 1 drop into both eyes at bedtime.   losartan (COZAAR) 50 MG tablet Take 1 tablet (50 mg total) by mouth daily.   MAGNESIUM GLYCINATE PO Take 350 mg by mouth.   melatonin 5 MG TABS Take by mouth at bedtime. 1-2 tablets   OVER THE COUNTER MEDICATION daily. Essential Oils   predniSONE (DELTASONE) 50 MG tablet Take 1 tablet (50 mg total) by mouth daily. Take if the nerve pain worsens   [DISCONTINUED] gabapentin (NEURONTIN) 100 MG capsule Take 1-3 capsules (100-300 mg total) by mouth at bedtime as needed (nerve pain).   No facility-administered encounter medications on file as of 12/17/2022.    Allergies (verified) Amlodipine, Aripiprazole, Carvedilol, Hydralazine, Irbesartan, Meclizine, Paroxetine hcl, Tizanidine, Tramadol, Zolpidem, and Zyrtec [cetirizine hcl]   History: Past Medical History:  Diagnosis Date   Abnormal EKG 10/23/2016   Arthritis 01/18/2020   Chronic fatigue 10/08/2015   Depression 01/18/2020   Diabetes mellitus 10/08/2015   Formatting of this note might be different from the original. Overview:  last Hgb A1C 7.2/no blood sugars at home Formatting of this note might be different from the original. last Hgb A1C 7.2/no blood sugars at home   ESR raised 11/27/2016   Frequent urination 12/16/2018   Glaucoma 10/08/2015   High cholesterol    History of pancreatitis 07/11/2014   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/23/2016   Hypertension 10/08/2015   IFG (  impaired fasting glucose) 10/24/2019   Incomplete emptying of bladder 11/30/2017   Knee pain, left 12/22/2016   Loss of memory 01/18/2020   Major depressive disorder in partial remission 10/08/2015   Morbid obesity 10/23/2016   Obesity (BMI 30-39.9) 11/10/2016   Obstructive sleep apnea 12/16/2018   Osteoarthritis of both knees 11/27/2016   Other insomnia 03/05/2017   Palpitations 10/23/2016   Positive ANA (antinuclear antibody) 10/23/2016   Sleep apnea  10/08/2015   Formatting of this note might be different from the original. Overview:  does not use CPAP Formatting of this note might be different from the original. does not use CPAP   Sore neck 10/24/2019   Spondylosis of cervical spine with myelopathy 01/18/2020   Type 2 diabetes mellitus without complication, without long-term current use of insulin 12/16/2018   Vitamin D deficiency 10/08/2015   Past Surgical History:  Procedure Laterality Date   APPENDECTOMY     age 59   CESAREAN SECTION     X 3   NECK SURGERY  2015   SPINE SURGERY  05/29/2015   Family History  Problem Relation Age of Onset   Diabetes Mother    Multiple sclerosis Mother    Heart disease Father    Heart Problems Father    Migraines Daughter    Thyroid disease Daughter    Migraines Daughter    Social History   Socioeconomic History   Marital status: Unknown    Spouse name: Not on file   Number of children: Not on file   Years of education: Not on file   Highest education level: Not on file  Occupational History   Not on file  Tobacco Use   Smoking status: Former    Packs/day: 1.00    Years: 9.00    Additional pack years: 0.00    Total pack years: 9.00    Types: Cigarettes    Start date: 1964    Quit date: 1973    Years since quitting: 51.3   Smokeless tobacco: Never  Vaping Use   Vaping Use: Never used  Substance and Sexual Activity   Alcohol use: Not Currently   Drug use: Not Currently   Sexual activity: Not on file  Other Topics Concern   Not on file  Social History Narrative   Tobacco use, amount per day now: No   Past tobacco use, amount per day: 10 years. , 1 pack daily.   How many years did you use tobacco: 10 years   Alcohol use (drinks per week): None.   Diet: Simple- Eggs, Sausage, Toast, Chicken/Beef Salad or Vegetables.    Do you drink/eat things with caffeine: No   Marital status:   Divorced                               What year were you married? 1968   Do you live in a house,  apartment, assisted living, condo, trailer, etc.? House.   Is it one or more stories? One   How many persons live in your home? Self.   Do you have pets in your home?( please list) 1 Brenda Mcfarland    Highest Level of education completed? Undergrad.    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   Do you exercise? No  Type and how often?   Do you have a living will? No   Do you have a DNR form? Yes                                  If not, do you want to discuss one? No   Do you have signed POA/HPOA forms? Yes                       If so, please bring to you appointment      Do you have any difficulty bathing or dressing yourself? Yes   Do you have any difficulty preparing food or eating? Yes   Do you have any difficulty managing your medications? No   Do you have any difficulty managing your finances? No   Do you have any difficulty affording your medications? No   Social Determinants of Corporate investment banker Strain: Not on file  Food Insecurity: Not on file  Transportation Needs: Not on file  Physical Activity: Not on file  Stress: Not on file  Social Connections: Not on file    Tobacco Counseling Counseling given: Not Answered   Clinical Intake:  Pre-visit preparation completed: Yes  Pain : No/denies pain     BMI - recorded: 38 Nutritional Status: BMI > 30  Obese Nutritional Risks: Unintentional weight gain Diabetes: Yes  How often do you need to have someone help you when you read instructions, pamphlets, or other written materials from your doctor or pharmacy?: 1 - Never  Diabetic?no         Activities of Daily Living    12/17/2022   10:48 AM  In your present state of health, do you have any difficulty performing the following activities:  Hearing? 0  Vision? 0  Difficulty concentrating or making decisions? 0  Walking or climbing stairs? 0  Dressing or bathing? 0  Doing errands, shopping? 1   Comment due to vertigo  Preparing Food and eating ? N  Using the Toilet? N  In the past six months, have you accidently leaked urine? Y  Do you have problems with loss of bowel control? N  Managing your Medications? N  Managing your Finances? N  Housekeeping or managing your Housekeeping? Y    Patient Care Team: [name], NP as PCP - General (Geriatric Medicine) Thomasene Ripple, DO as PCP - Cardiology (Cardiology) Adelfa Koh, MD as Referring Physician (Orthopedic Surgery) Georgeanna Lea, MD as Consulting Physician (Cardiology)  Indicate any recent Medical Services you may have received from other than [facility] providers in the past year (date may be approximate).     Assessment:   This is a routine wellness examination for Brenda Mcfarland.  Hearing/Vision screen Vision Screening - Comments:: Dr. Logan Mcfarland in [facility]. No recent appointment, patient will schedule one.   Dietary issues and exercise activities discussed: Current Exercise Habits: The patient does not participate in regular exercise at present   Goals Addressed   None    Depression Screen    12/17/2022   10:22 AM 12/15/2021    1:04 PM 08/21/2021   10:05 AM 07/03/2021    4:12 PM  PHQ 2/9 Scores  PHQ - 2 Score 0 0 0 0    Fall Risk    12/17/2022   10:22 AM 03/26/2022    2:49 PM 12/15/2021    1:04 PM 11/17/2021  2:37 PM 08/21/2021   10:04 AM  Fall Risk   Falls in the past year? 0 0 0 0 0  Number falls in past yr: 0 0 0 0 0  Injury with Fall? 0 0 0 0 0  Risk for fall due to :  No Fall Risks No Fall Risks  History of fall(s)  Follow up  Falls evaluation completed Falls evaluation completed Falls evaluation completed Falls evaluation completed;Education provided;Falls prevention discussed    FALL RISK PREVENTION PERTAINING TO THE HOME:  Any stairs in or around the home? Yes  If so, are there any without handrails? No  Home free of loose throw rugs in walkways, pet beds, electrical cords,  etc? Yes  Adequate lighting in your home to reduce risk of falls? Yes   ASSISTIVE DEVICES UTILIZED TO PREVENT FALLS:  Life alert? No  Use of a cane, walker or w/c? No  Grab bars in the bathroom? Yes  Shower chair or bench in shower? No  Elevated toilet seat or a handicapped toilet? Yes   TIMED UP AND GO:  Was the test performed? No .    Cognitive Function:        12/17/2022   10:22 AM 07/03/2021    4:18 PM  6CIT Screen  What Year? 0 points 0 points  What month? 0 points 0 points  What time? 0 points 0 points  Count back from 20 0 points 0 points  Months in reverse 0 points 0 points  Repeat phrase 2 points 0 points  Total Score 2 points 0 points    Immunizations Immunization History  Administered Date(s) Administered   Tdap 06/30/2018    TDAP status: Up to date  Flu Vaccine status: Up to date  Pneumococcal vaccine status: Due, Education has been provided regarding the importance of this vaccine. Advised may receive this vaccine at local pharmacy or Health Dept. Aware to provide a copy of the vaccination record if obtained from local pharmacy or Health Dept. Verbalized acceptance and understanding.  Covid-19 vaccine status: Information provided on how to obtain vaccines.   Qualifies for Shingles Vaccine? Yes   Zostavax completed No   Shingrix Completed?: No.    Education has been provided regarding the importance of this vaccine. Patient has been advised to call insurance company to determine out of pocket expense if they have not yet received this vaccine. Advised may also receive vaccine at local pharmacy or Health Dept. Verbalized acceptance and understanding.  Screening Tests Health Maintenance  Topic Date Due   COVID-19 Vaccine (1) Never done   FOOT EXAM  Never done   OPHTHALMOLOGY EXAM  Never done   HEMOGLOBIN A1C  10/30/2022   Diabetic kidney evaluation - Urine ACR  12/16/2022   Zoster Vaccines- Shingrix (1 of 2) 03/18/2023 (Originally 01/19/1965)    Pneumonia Vaccine 50+ Years old (1 of 1 - PCV) 12/17/2023 (Originally 01/18/2010)   DEXA SCAN  12/17/2023 (Originally 01/18/2010)   INFLUENZA VACCINE  04/01/2023   Diabetic kidney evaluation - eGFR measurement  05/02/2023   Medicare Annual Wellness (AWV)  12/17/2023   DTaP/Tdap/Td (2 - Td or Tdap) 06/30/2028   Hepatitis C Screening  Completed   HPV VACCINES  Aged Out    Health Maintenance  Health Maintenance Due  Topic Date Due   COVID-19 Vaccine (1) Never done   FOOT EXAM  Never done   OPHTHALMOLOGY EXAM  Never done   HEMOGLOBIN A1C  10/30/2022   Diabetic kidney evaluation - Urine  ACR  12/16/2022    Colorectal cancer screening: No longer required.   Mammogram status: No longer required due to age.  Declines bone density   Lung Cancer Screening: (Low Dose CT Chest recommended if Age 64-80 years, 30 pack-year currently smoking OR have quit w/in 15years.) does not qualify.   Lung Cancer Screening Referral: na  Additional Screening:  Hepatitis C Screening: does qualify; Completed 2023  Vision Screening: Recommended annual ophthalmology exams for early detection of glaucoma and other disorders of the eye. Is the patient up to date with their annual eye exam?  No  Who is the provider or what is the name of the office in which the patient attends annual eye exams? Dr Logan Mcfarland If pt is not established with a provider, would they like to be referred to a provider to establish care? No .   Dental Screening: Recommended annual dental exams for proper oral hygiene  Community Resource Referral / Chronic Care Management: CRR required this visit?  No   CCM required this visit?  No      Plan:     I have personally reviewed and noted the following in the patient's chart:   Medical and social history Use of alcohol, tobacco or illicit drugs  Current medications and supplements including opioid prescriptions. Patient is not currently taking opioid prescriptions. Functional ability  and status Nutritional status Physical activity Advanced directives List of other physicians Hospitalizations, surgeries, and ER visits in previous 12 months Vitals Screenings to include cognitive, depression, and falls Referrals and appointments  In addition, I have reviewed and discussed with patient certain preventive protocols, quality metrics, and best practice recommendations. A written personalized care plan for preventive services as well as general preventive health recommendations were provided to patient.     [name], NP   12/17/2022   Virtual Visit via Video Note  I connected with Brenda Mcfarland on 12/17/22 at 10:40 AM EDT by a video enabled telemedicine application and verified that I am speaking with the correct person using two identifiers.  Location: Patient: home Provider: [facility]    I discussed the limitations of evaluation and management by telemedicine and the availability of in person appointments. The patient expressed understanding and agreed to proceed.    I discussed the assessment and treatment plan with the patient. The patient was provided an opportunity to ask questions and all were answered. The patient agreed with the plan and demonstrated an understanding of the instructions.   The patient was advised to call back or seek an in-person evaluation if the symptoms worsen or if the condition fails to improve as anticipated.  I provided 15 minutes of non-face-to-face time during this encounter.  Janene Harvey. Janyth Contes, AGNP Avs printed and mailed.

## 2022-12-17 NOTE — Telephone Encounter (Signed)
Ms. jameka, ivie are scheduled for a virtual visit with your provider today.    Just as we do with appointments in the office, we must obtain your consent to participate.  Your consent will be active for this visit and any virtual visit you Danasia Baker have with one of our providers in the next 365 days.    If you have a MyChart account, I can also send a copy of this consent to you electronically.  All virtual visits are billed to your insurance company just like a traditional visit in the office.  As this is a virtual visit, video technology does not allow for your provider to perform a traditional examination.  This Shreena Baines limit your provider's ability to fully assess your condition.  If your provider identifies any concerns that need to be evaluated in person or the need to arrange testing such as labs, EKG, etc, we will make arrangements to do so.    Although advances in technology are sophisticated, we cannot ensure that it will always work on either your end or our end.  If the connection with a video visit is poor, we Axton Cihlar have to switch to a telephone visit.  With either a video or telephone visit, we are not always able to ensure that we have a secure connection.   I need to obtain your verbal consent now.   Are you willing to proceed with your visit today?   Reyes Fifield has provided verbal consent on 12/17/2022 for a virtual visit (video or telephone).   MayBeckey Downing, [place] 12/17/2022  10:27 AM

## 2022-12-18 DIAGNOSIS — G4733 Obstructive sleep apnea (adult) (pediatric): Secondary | ICD-10-CM | POA: Diagnosis not present

## 2022-12-30 IMAGING — DX DG ABDOMEN 1V
2 series · 2 of 2 positions shown · non-contrast
Comparison: 08/17/2017

CLINICAL DATA: Chronic idiopathic constipation, diarrhea and
abdominal pain.

EXAM:
ABDOMEN - 1 VIEW

[dg abd 1 view (1 of 2)]
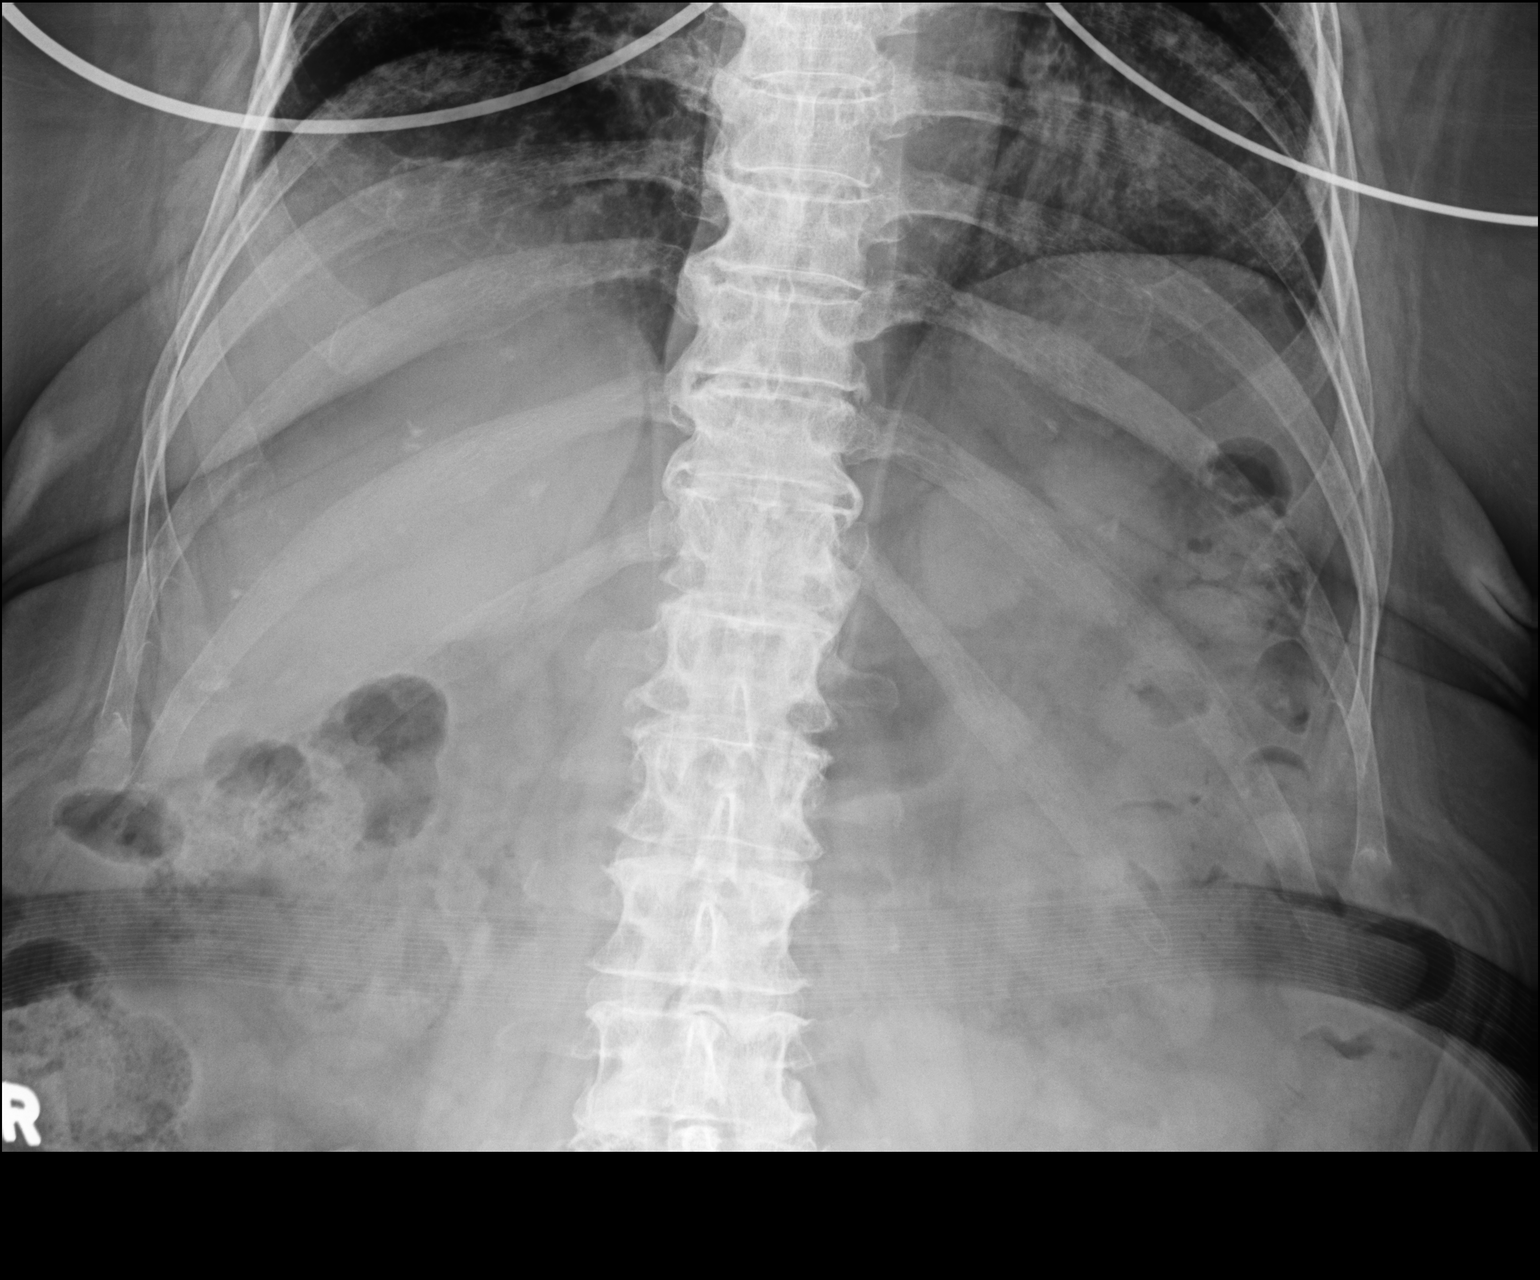

[dg abd 1 view (2 of 2)]
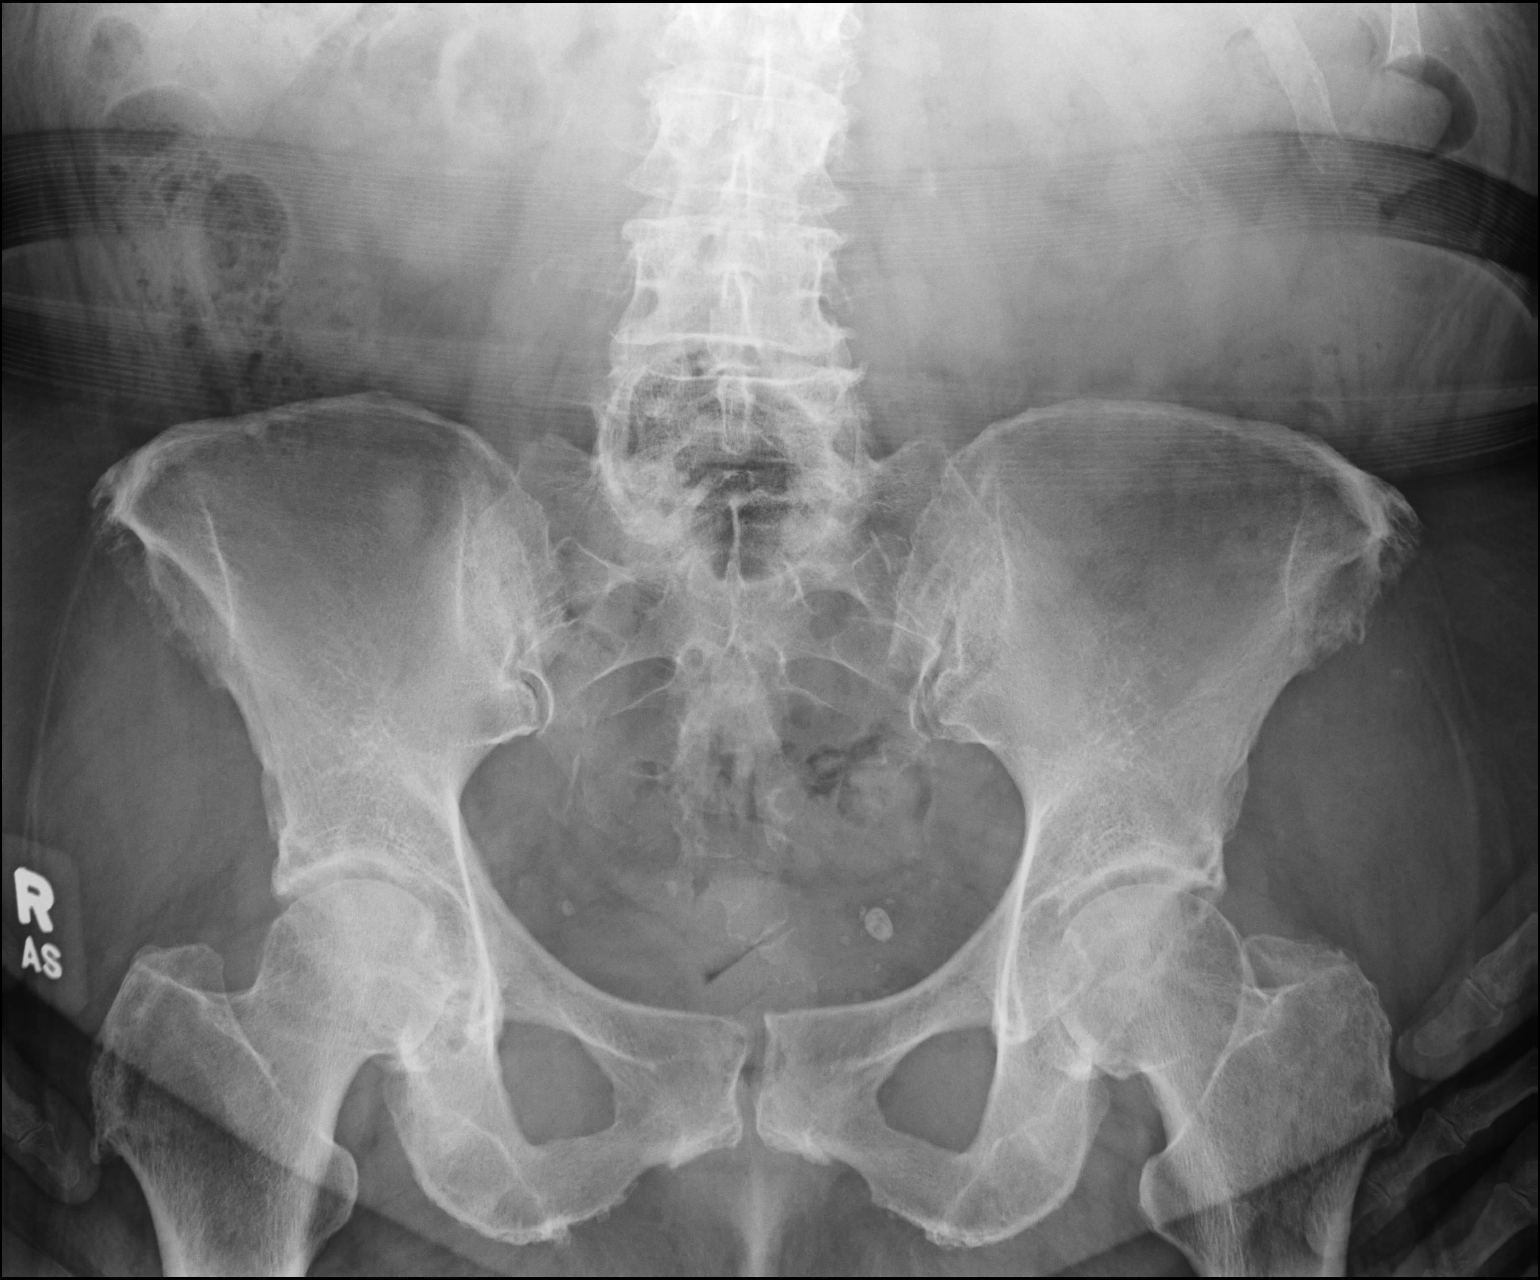

[2 of 2 positions shown; findings below may reference images not displayed]

FINDINGS: Normal bowel gas pattern. Moderate increase in the colonic stool
burden similar to the prior radiographs.

No evidence of renal or ureteral stones. Soft tissues are
unremarkable.

Clear lung bases.

Degenerative changes noted along the lumbar spine.
IMPRESSION: 1. No acute findings.
2. Moderate increase in the colonic stool burden.

## 2023-01-01 ENCOUNTER — Ambulatory Visit: Payer: Medicare HMO | Admitting: Nurse Practitioner

## 2023-01-01 DIAGNOSIS — M5417 Radiculopathy, lumbosacral region: Secondary | ICD-10-CM | POA: Diagnosis not present

## 2023-01-01 DIAGNOSIS — M9903 Segmental and somatic dysfunction of lumbar region: Secondary | ICD-10-CM | POA: Diagnosis not present

## 2023-01-01 DIAGNOSIS — M62830 Muscle spasm of back: Secondary | ICD-10-CM | POA: Diagnosis not present

## 2023-01-01 DIAGNOSIS — M9904 Segmental and somatic dysfunction of sacral region: Secondary | ICD-10-CM | POA: Diagnosis not present

## 2023-01-04 DIAGNOSIS — M5417 Radiculopathy, lumbosacral region: Secondary | ICD-10-CM | POA: Diagnosis not present

## 2023-01-04 DIAGNOSIS — M9904 Segmental and somatic dysfunction of sacral region: Secondary | ICD-10-CM | POA: Diagnosis not present

## 2023-01-04 DIAGNOSIS — M62830 Muscle spasm of back: Secondary | ICD-10-CM | POA: Diagnosis not present

## 2023-01-04 DIAGNOSIS — M9903 Segmental and somatic dysfunction of lumbar region: Secondary | ICD-10-CM | POA: Diagnosis not present

## 2023-01-06 DIAGNOSIS — M5417 Radiculopathy, lumbosacral region: Secondary | ICD-10-CM | POA: Diagnosis not present

## 2023-01-06 DIAGNOSIS — M62830 Muscle spasm of back: Secondary | ICD-10-CM | POA: Diagnosis not present

## 2023-01-06 DIAGNOSIS — M9904 Segmental and somatic dysfunction of sacral region: Secondary | ICD-10-CM | POA: Diagnosis not present

## 2023-01-06 DIAGNOSIS — M9903 Segmental and somatic dysfunction of lumbar region: Secondary | ICD-10-CM | POA: Diagnosis not present

## 2023-01-08 DIAGNOSIS — M62830 Muscle spasm of back: Secondary | ICD-10-CM | POA: Diagnosis not present

## 2023-01-08 DIAGNOSIS — M9904 Segmental and somatic dysfunction of sacral region: Secondary | ICD-10-CM | POA: Diagnosis not present

## 2023-01-08 DIAGNOSIS — M9903 Segmental and somatic dysfunction of lumbar region: Secondary | ICD-10-CM | POA: Diagnosis not present

## 2023-01-08 DIAGNOSIS — M5417 Radiculopathy, lumbosacral region: Secondary | ICD-10-CM | POA: Diagnosis not present

## 2023-01-12 ENCOUNTER — Ambulatory Visit: Payer: Medicare HMO | Admitting: Family Medicine

## 2023-01-13 DIAGNOSIS — G4733 Obstructive sleep apnea (adult) (pediatric): Secondary | ICD-10-CM | POA: Diagnosis not present

## 2023-01-18 ENCOUNTER — Ambulatory Visit (INDEPENDENT_AMBULATORY_CARE_PROVIDER_SITE_OTHER): Payer: Medicare HMO | Admitting: Family Medicine

## 2023-01-18 ENCOUNTER — Ambulatory Visit (HOSPITAL_BASED_OUTPATIENT_CLINIC_OR_DEPARTMENT_OTHER)
Admission: RE | Admit: 2023-01-18 | Discharge: 2023-01-18 | Disposition: A | Discharge status: Home / Self Care | Payer: Medicare HMO | Source: Ambulatory Visit | Attending: Family Medicine | Admitting: Family Medicine

## 2023-01-18 VITALS — BP 138/88 | Ht 67.0 in

## 2023-01-18 DIAGNOSIS — S39012A Strain of muscle, fascia and tendon of lower back, initial encounter: Secondary | ICD-10-CM

## 2023-01-18 DIAGNOSIS — M1612 Unilateral primary osteoarthritis, left hip: Secondary | ICD-10-CM

## 2023-01-18 MED ORDER — METHYLPREDNISOLONE ACETATE 40 MG/ML IJ SUSP
40.0000 mg | Freq: Once | INTRAMUSCULAR | Status: AC
Start: 2023-01-18 — End: 2023-01-18
  Administered 2023-01-18: 40 mg via INTRAMUSCULAR

## 2023-01-18 MED ORDER — HYDROCODONE-ACETAMINOPHEN 5-325 MG PO TABS: 1.0000 | ORAL_TABLET | Freq: Three times a day (TID) | ORAL | 0 refills | Status: DC | PRN

## 2023-01-18 NOTE — Assessment & Plan Note (Signed)
Acutely occurring.  She does have stiffness and pain that originates in the lower lumbar spine. -Counseled on home exercise therapy and supportive care. -X-ray. -Could consider physical therapy

## 2023-01-18 NOTE — Progress Notes (Signed)
  Brenda Mcfarland - 77 y.o. female MRN [id number]  Date of birth: 09/05/1945  SUBJECTIVE:  Including CC & ROS.  No chief complaint on file.   Brenda Mcfarland is a 77 y.o. female that is presenting with acute worsening of her left hip pain.  It was exacerbated by a chiropractor.  She also has pain in the lower lumbar spine.  She has stiffness and pain with any movement.   Review of Systems See HPI   HISTORY: Past Medical, Surgical, Social, and Family History Reviewed & Updated per EMR.   Pertinent Historical Findings include:  Past Medical History:  Diagnosis Date   Abnormal EKG 10/23/2016   Arthritis 01/18/2020   Chronic fatigue 10/08/2015   Depression 01/18/2020   Diabetes mellitus (HCC) 10/08/2015   Formatting of this note might be different from the original. Overview:  last Hgb A1C 7.2/no blood sugars at home Formatting of this note might be different from the original. last Hgb A1C 7.2/no blood sugars at home   ESR raised 11/27/2016   Frequent urination 12/16/2018   Glaucoma 10/08/2015   High cholesterol    History of pancreatitis 07/11/2014   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/23/2016   Hypertension 10/08/2015   IFG (impaired fasting glucose) 10/24/2019   Incomplete emptying of bladder 11/30/2017   Knee pain, left 12/22/2016   Loss of memory 01/18/2020   Major depressive disorder in partial remission (HCC) 10/08/2015   Morbid obesity (HCC) 10/23/2016   Obesity (BMI 30-39.9) 11/10/2016   Obstructive sleep apnea 12/16/2018   Osteoarthritis of both knees 11/27/2016   Other insomnia 03/05/2017   Palpitations 10/23/2016   Positive ANA (antinuclear antibody) 10/23/2016   Sleep apnea 10/08/2015   Formatting of this note might be different from the original. Overview:  does not use CPAP Formatting of this note might be different from the original. does not use CPAP   Sore neck 10/24/2019   Spondylosis of cervical spine with myelopathy 01/18/2020   Type 2 diabetes mellitus without complication, without long-term current use  of insulin (HCC) 12/16/2018   Vitamin D deficiency 10/08/2015    Past Surgical History:  Procedure Laterality Date   APPENDECTOMY     age 48   CESAREAN SECTION     X 3   NECK SURGERY  2015   SPINE SURGERY  05/29/2015     PHYSICAL EXAM:  VS: BP 138/88   Ht 5\' 7"  (1.702 m)   BMI 38.69 kg/m  Physical Exam Gen: NAD, alert, cooperative with exam, well-appearing MSK:  Neurovascularly intact       ASSESSMENT & PLAN:   OA (osteoarthritis) of hip Acutely worse.  Did well with the previous injection but her symptoms have returned since seeing a chiropractor. -Counseled on home exercise therapy and supportive care. -IM Depo-Medrol. -Could consider further imaging  Strain of lumbar region Acutely occurring.  She does have stiffness and pain that originates in the lower lumbar spine. -Counseled on home exercise therapy and supportive care. -X-ray. -Could consider physical therapy

## 2023-01-18 NOTE — Assessment & Plan Note (Signed)
Acutely worse.  Did well with the previous injection but her symptoms have returned since seeing a chiropractor. -Counseled on home exercise therapy and supportive care. -IM Depo-Medrol. -Could consider further imaging

## 2023-01-18 NOTE — Patient Instructions (Signed)
Good to see you Please alternate heat and ice  Please try the exercises  Please use the pain medicine as needed   Please send me a message in MyChart with any questions or updates.  Please see the clinic back in 3 weeks.   --Dr. Jordan Likes

## 2023-01-20 ENCOUNTER — Ambulatory Visit (INDEPENDENT_AMBULATORY_CARE_PROVIDER_SITE_OTHER): Payer: Medicare HMO | Admitting: Family Medicine

## 2023-01-20 ENCOUNTER — Other Ambulatory Visit: Payer: Self-pay

## 2023-01-20 ENCOUNTER — Encounter: Payer: Self-pay | Admitting: Family Medicine

## 2023-01-20 ENCOUNTER — Ambulatory Visit (INDEPENDENT_AMBULATORY_CARE_PROVIDER_SITE_OTHER): Payer: Medicare HMO

## 2023-01-20 VITALS — BP 144/86 | HR 98 | Ht 67.0 in

## 2023-01-20 DIAGNOSIS — S39012A Strain of muscle, fascia and tendon of lower back, initial encounter: Secondary | ICD-10-CM | POA: Diagnosis not present

## 2023-01-20 DIAGNOSIS — M25552 Pain in left hip: Secondary | ICD-10-CM

## 2023-01-20 DIAGNOSIS — M5416 Radiculopathy, lumbar region: Secondary | ICD-10-CM

## 2023-01-20 DIAGNOSIS — M1612 Unilateral primary osteoarthritis, left hip: Secondary | ICD-10-CM | POA: Diagnosis not present

## 2023-01-20 MED ORDER — OXYCODONE-ACETAMINOPHEN 5-325 MG PO TABS: 1.0000 | ORAL_TABLET | Freq: Three times a day (TID) | ORAL | 0 refills | Status: DC | PRN

## 2023-01-20 NOTE — Progress Notes (Signed)
[name], PhD, LAT, ATC acting as a scribe for Clementeen Graham, MD.  Brenda Mcfarland is a 77 y.o. female who presents to [facility] at [facility] today for low back pain. Pt was previously seen by Dr. Denyse Amass on 10/22/22 for chronic right shoulder, right trapezius, and right sided neck pain.   Today, pt c/o low back pain that was exacerbated by a chiropractic adjustment 7-10 days ago. She was just seen by Dr. Jordan Likes on the 20th w/ this complaint and was given a Depo-Medrol IM injection and was prescribed hydrocodone. Pt locates pain to right hip, which has improved. Also c/o left hip pain radiating into the glute and groin. Sx causing difficulty with ambulation. No relief with steroid injection with Dr. Jordan Likes. Notes GI upset with Hydrocodone so not currently taking. Has also been taking Gabapentin which was originally prescribed for shoulder sx. Feels better when leg is outstretched.   Pain is severe 10 out of 10 at times.  Pain is primarily located in the left low back radiating around to the groin and down the leg to the lateral calf.  Radiating pain: hip, glute, groin, leg down to the foot LE numbness/tingling: no LE weakness: no Aggravates: walking Treatments tried: Hydrocodone-APAP, IM steroid inj, Gabapentin  Dx imaging: 01/18/23 L-spine XR  12/01/22 L hip XR  Pertinent review of systems: No fevers or chills  Relevant historical information: Hypertension and diabetes.   Exam:  BP (!) 144/86   Pulse 98   Ht 5\' 7"  (1.702 m)   SpO2 94%   BMI 38.69 kg/m  General: Well Developed, well nourished, and in no acute distress.   MSK: L-spine normal appearing. Nontender palpation spinal midline.  Tender palpation left SI joint. Left hip tender palpation left SI joint.  Normal motion pain with rotation and flexion.  Tender palpation greater trochanter.  Nontender anterior hip. Strength general decreased left leg.  Antalgic gait using a cane to ambulate.    Lab  and Radiology Results  Procedure: Real-time Ultrasound Guided Injection of left SI joint Device: Philips Affiniti 50G Images permanently stored and available for review in PACS Verbal informed consent obtained.  Discussed risks and benefits of procedure. Warned about infection, bleeding, hyperglycemia damage to structures among others. Patient expresses understanding and agreement Time-out conducted.   Noted no overlying erythema, induration, or other signs of local infection.   Skin prepped in a sterile fashion.   Local anesthesia: Topical Ethyl chloride.   With sterile technique and under real time ultrasound guidance: 40 mg of Kenalog and 2 mL Marcaine injected into joint cleft. Fluid seen entering the joint.   Completed without difficulty   Pain moderately resolved suggesting accurate placement of the medication.   Advised to call if fevers/chills, erythema, induration, drainage, or persistent bleeding.   Images permanently stored and available for review in the ultrasound unit.  Impression: Technically successful ultrasound guided injection.    X-ray images left hip obtained today personally and independently interpreted.  X-ray images lumbar spine obtained at Dr. Jordan Likes office on May 20 but not yet read by radiology personally and independently interpreted.  L-spine: No acute fractures.  DDD L5-S1.  Left hip: Mild DJD left hip joint.  No acute fractures are visible.  Erosive change present lesser trochanter.  Appears similar to prior x-ray dated December 01, 2022.  Await formal radiology review     Assessment and Plan: 77 y.o. female with severe pain in the left low back radiating around  the side of the hip to the groin and pain radiating down the leg to the lateral calf.  I believe the pain is multifactorial.  I think a fair amount of her pain is lumbar radiculopathy.  Some of the pain could be hip related and I think she has some SI joint related pain.  The pain started after  chiropractor adjustment.  Today she is point tender in the SI joint so we will go ahead and try injecting that.  Will refer to PT.  Could try an interarticular hip injection and if not improving or worsening she may benefit from MRI of the lumbar spine and left hip. Oxycodone prescribed for pain control.  She will keep me updated.   PDMP reviewed during this encounter. Orders Placed This Encounter  Procedures   [place] LIMITED JOINT SPACE STRUCTURES LOW LEFT(NO LINKED CHARGES)    Order Specific Question:   Reason for Exam (SYMPTOM  OR DIAGNOSIS REQUIRED)    Answer:   left hip pain    Order Specific Question:   Preferred imaging location?    Answer:   [facility] HIP UNILAT W OR W/O PELVIS 2-3 VIEWS LEFT    Standing Status:   Future    Number of Occurrences:   1    Standing Expiration Date:   02/20/2023    Order Specific Question:   Reason for Exam (SYMPTOM  OR DIAGNOSIS REQUIRED)    Answer:   left hip pain    Order Specific Question:   Preferred imaging location?    Answer:   [facility]   Ambulatory referral to Physical Therapy    Referral Priority:   Routine    Referral Type:   Physical Medicine    Referral Reason:   Specialty Services Required    Requested Specialty:   Physical Therapy    Number of Visits Requested:   1   Meds ordered this encounter  Medications   oxyCODONE-acetaminophen (PERCOCET/ROXICET) 5-325 MG tablet    Sig: Take 1 tablet by mouth every 8 (eight) hours as needed for severe pain.    Dispense:  15 tablet    Refill:  0     Discussed warning signs or symptoms. Please see discharge instructions. Patient expresses understanding.   The above documentation has been reviewed and is accurate and complete Clementeen Graham, M.D.

## 2023-01-20 NOTE — Patient Instructions (Addendum)
Thank you for coming in today.   Please get an Xray today before you leave   You received an injection today. Seek immediate medical attention if the joint becomes red, extremely painful, or is oozing fluid.   I've referred you to Physical Therapy.  Let us know if you don't hear from them in one week.   Check back in 6 weeks   

## 2023-01-22 ENCOUNTER — Encounter: Payer: Self-pay | Admitting: Nurse Practitioner

## 2023-01-22 NOTE — Progress Notes (Signed)
Left hip x-ray shows mild arthritis of both hips.

## 2023-01-26 NOTE — Telephone Encounter (Signed)
Message routed to PCP Eubanks, Jessica K, NP  

## 2023-01-28 ENCOUNTER — Other Ambulatory Visit: Payer: Self-pay

## 2023-01-28 ENCOUNTER — Ambulatory Visit: Payer: Medicare HMO | Attending: Family Medicine

## 2023-01-28 DIAGNOSIS — S39012A Strain of muscle, fascia and tendon of lower back, initial encounter: Secondary | ICD-10-CM | POA: Insufficient documentation

## 2023-01-28 DIAGNOSIS — M25552 Pain in left hip: Secondary | ICD-10-CM | POA: Insufficient documentation

## 2023-01-28 DIAGNOSIS — M5416 Radiculopathy, lumbar region: Secondary | ICD-10-CM | POA: Diagnosis not present

## 2023-01-28 NOTE — Therapy (Signed)
OUTPATIENT PHYSICAL THERAPY THORACOLUMBAR EVALUATION   Patient Name: Brenda Mcfarland MRN: [id number] DOB:[birth year], 77 y.o., female Today's Date: 01/28/2023  END OF SESSION:  PT End of Session - 01/28/23 1419     Visit Number 1    Date for PT Re-Evaluation 03/25/23    PT Start Time 1441    PT Stop Time 1530    PT Time Calculation (min) 49 min             Past Medical History:  Diagnosis Date   Abnormal EKG 10/23/2016   Arthritis 01/18/2020   Chronic fatigue 10/08/2015   Depression 01/18/2020   Diabetes mellitus (HCC) 10/08/2015   Formatting of this note might be different from the original. Overview:  last Hgb A1C 7.2/no blood sugars at home Formatting of this note might be different from the original. last Hgb A1C 7.2/no blood sugars at home   ESR raised 11/27/2016   Frequent urination 12/16/2018   Glaucoma 10/08/2015   High cholesterol    History of pancreatitis 07/11/2014   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/23/2016   Hypertension 10/08/2015   IFG (impaired fasting glucose) 10/24/2019   Incomplete emptying of bladder 11/30/2017   Knee pain, left 12/22/2016   Loss of memory 01/18/2020   Major depressive disorder in partial remission (HCC) 10/08/2015   Morbid obesity (HCC) 10/23/2016   Obesity (BMI 30-39.9) 11/10/2016   Obstructive sleep apnea 12/16/2018   Osteoarthritis of both knees 11/27/2016   Other insomnia 03/05/2017   Palpitations 10/23/2016   Positive ANA (antinuclear antibody) 10/23/2016   Sleep apnea 10/08/2015   Formatting of this note might be different from the original. Overview:  does not use CPAP Formatting of this note might be different from the original. does not use CPAP   Sore neck 10/24/2019   Spondylosis of cervical spine with myelopathy 01/18/2020   Type 2 diabetes mellitus without complication, without long-term current use of insulin (HCC) 12/16/2018   Vitamin D deficiency 10/08/2015   Past Surgical History:  Procedure Laterality Date   APPENDECTOMY     age 57   CESAREAN SECTION      X 3   NECK SURGERY  2015   SPINE SURGERY  05/29/2015   Patient Active Problem List   Diagnosis Date Noted   Strain of lumbar region 01/18/2023   Primary osteoarthritis of right shoulder 12/01/2022   OA (osteoarthritis) of hip 12/01/2022   Nocturia 05/15/2022   Paroxysmal SVT (supraventricular tachycardia) 01/22/2020   Arthritis 01/18/2020   Depression 01/18/2020   Loss of memory 01/18/2020   Spondylosis of cervical spine with myelopathy 01/18/2020   IFG (impaired fasting glucose) 10/24/2019   Frequent urination 12/16/2018   Obstructive sleep apnea 12/16/2018   Type 2 diabetes mellitus without complication, without long-term current use of insulin (HCC) 12/16/2018   Incomplete emptying of bladder 11/30/2017   Insomnia 03/05/2017   ESR raised 11/27/2016   Osteoarthritis of both knees 11/27/2016   Obesity (BMI 30-39.9) 11/10/2016   Abnormal EKG 10/23/2016   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/23/2016   Morbid obesity (HCC) 10/23/2016   Palpitations 10/23/2016   Positive ANA (antinuclear antibody) 10/23/2016   Hypertension 10/08/2015   Chronic fatigue 10/08/2015   Diabetes mellitus (HCC) 10/08/2015   Glaucoma 10/08/2015   Major depressive disorder in partial remission (HCC) 10/08/2015   Vitamin D deficiency 10/08/2015   Sleep apnea 10/08/2015   History of pancreatitis 07/11/2014    PCP: Abbey Chatters, NP  REFERRING PROVIDER: Clementeen Graham, MD  REFERRING DIAG: L lumbar radiculopathy  Rationale for Evaluation and Treatment: Rehabilitation  THERAPY DIAG:  Acute left lumbar radiculopathy  ONSET DATE: 01/05/23  SUBJECTIVE:                                                                                                                                                                                           SUBJECTIVE STATEMENT: The patient reports had spinal manipulation approximately 4 weeks ago, then developed L lateral hip pain.  Injection of her SI jt did not help   PERTINENT  HISTORY:  Went to chiropractor, had adjustment that seemed to cause L lateral hip pain. Had injection L SI . Referred to PT   PAIN:  Are you having pain? Yes: NPRS scale: 9/10 Pain location: L lateral hip and thigh, groin Pain description: worse with lifting, moving L leg Aggravating factors: getting in and out of bed, walking, sit to stand Relieving factors: sitting on sofa in a certain position  PRECAUTIONS: Fall and Other: frequent vertigo  WEIGHT BEARING RESTRICTIONS: No  FALLS:  Has patient fallen in last 6 months? Yes. Number of falls 1 . L leg gave away 2 days ago  LIVING ENVIRONMENT: Lives with: lives alone Lives in: House/apartment Stairs: No Has following equipment at home: Single point cane and Environmental consultant - 2 wheeled  OCCUPATION: retired  PLOF: Independent with basic ADLs, Independent with household mobility with device, Independent with gait, and Independent with transfers  PATIENT GOALS: be able to get out of the house, walk I  NEXT MD VISIT: 02/09/23  OBJECTIVE:   DIAGNOSTIC FINDINGS:  L-spine: No acute fractures.  DDD L5-S1.   Left hip: Mild DJD left hip joint.  No acute fractures are visible.  Erosive change present lesser trochanter.  Appears similar to prior x-ray dated December 01, 2022.  PATIENT SURVEYS:  Modified Oswestry 22/50 or 66% disability   SCREENING FOR RED FLAGS: Bowel or bladder incontinence: No Spinal tumors: No Cauda equina syndrome: No Compression fracture: No Abdominal aneurysm: No  COGNITION: Overall cognitive status: Within functional limits for tasks assessed     SENSATION: WFL  MUSCLE LENGTH: Hamstrings: Right -30 deg; Left wfl deg  POSTURE: rounded shoulders, forward head, and flexed trunk   PALPATION: Hypersensitive/tender L lateral and post hip musculature, pronounced   LUMBAR ROM:   AROM eval  Flexion 40  Extension 40  Right lateral flexion   Left lateral flexion   Right rotation   Left rotation    (Blank rows =  not tested)  LOWER EXTREMITY ROM:     Passive  Right eval Left eval  Hip flexion 105 105  Hip extension  Hip abduction    Hip adduction    Hip internal rotation 15 15  Hip external rotation wfl Wfl with pain  Knee flexion    Knee extension    Ankle dorsiflexion    Ankle plantarflexion    Ankle inversion    Ankle eversion     (Blank rows = not tested)  LOWER EXTREMITY MMT:    MMT Right eval Left eval  Hip flexion  P! 3/5  Hip extension    Hip abduction  3/5  Hip adduction    Hip internal rotation    Hip external rotation    Knee flexion    Knee extension  Pain L lat hip 4/5  Ankle dorsiflexion    Ankle plantarflexion    Ankle inversion    Ankle eversion     (Blank rows = not tested)  LUMBAR SPECIAL TESTS:  Straight leg raise test: Negative and Slump test: Positive  FUNCTIONAL TESTS:  30 seconds chair stand test 10 meter walk test: 13.1sec  GAIT: Distance walked: in clinic up to 70' Assistive device utilized: Single point cane Level of assistance: Modified independence Comments: antalgic L  TODAY'S TREATMENT:                                                                                                                              DATE: 01/28/23:  Manual: Trigger Point Dry-Needling  Treatment instructions: Expect mild to moderate muscle soreness. S/S of pneumothorax if dry needled over a lung field, and to seek immediate medical attention should they occur. Patient verbalized understanding of these instructions and education. Patient Consent Given: Yes Education handout provided: Previously provided Muscles treated: L gluteus medius, minimus, TFL, 6 pts Electrical stimulation performed: No Parameters: N/A Treatment response/outcome: Twitch Response Elicited and Palpable Increase in Muscle Length   Sidelying R For deep cross friction massage L TFL, glut medius  Therex: instructed in isometrics for hips per HEP below  PATIENT EDUCATION:  Education  details: POC  Person educated: Patient Education method: Programmer, multimedia, Facilities manager, Actor cues, Verbal cues, and Handouts Education comprehension: verbalized understanding, returned demonstration, and verbal cues required  HOME EXERCISE PROGRAM: Access Code: [facility] URL: [email or web address] Date: 01/28/2023 Prepared by: Charlaine Utsey  Exercises - Seated Isometric Hip Abduction with Belt  - 3 x daily - 7 x weekly - 1 sets - 5 reps - 10 sec hold - Seated Isometric Hip Adduction with Ball  - 1 x daily - 7 x weekly - 1 sets - 5 reps - 10 ec hold - Seated Gluteal Sets  - 1 x daily - 7 x weekly - 1 sets - 5 reps - 10 sec  hold  ASSESSMENT:  CLINICAL IMPRESSION: Patient is a 77 y.o. female who was seen today for physical therapy evaluation and treatment for L hip/radicular pain. She demonstrated high tissue irritability L lateral hip musculature , especially L TFL and L glut med.  Responded well to both dry needling  and deep myofascial release today.  Also weakness noted L glut med especially today.  Somewhat complicated evaluation due to patient with chronic vertigo and had episode of vertigo over the last several days which was resolved enough for her to drive today, but her movements were very slow, guarded.  Should benefit from skilled PT to address her goals and function, pain.   OBJECTIVE IMPAIRMENTS: decreased activity tolerance, decreased balance, decreased endurance, difficulty walking, decreased ROM, decreased strength, dizziness, and pain.   ACTIVITY LIMITATIONS: carrying, lifting, bending, standing, squatting, sleeping, transfers, and bed mobility  PARTICIPATION LIMITATIONS: laundry, driving, shopping, and community activity  PERSONAL FACTORS: Age, Behavior pattern, Education, Fitness, Past/current experiences, Time since onset of injury/illness/exacerbation, Transportation, and 3+ comorbidities: vertigo, acute and chronic, R cervical radiculopathy, DM  are also  affecting patient's functional outcome.   REHAB POTENTIAL: Good  CLINICAL DECISION MAKING: Evolving/moderate complexity  EVALUATION COMPLEXITY: Moderate   GOALS: Goals reviewed with patient? Yes  SHORT TERM GOALS: Target date: 2 weeks, 02/11/23  I HEP  Baseline: Goal status: INITIAL   LONG TERM GOALS: Target date: 8 weeks, 03/25/23  Modified oswestry score:22/50 Baseline: 44/50 Goal status: INITIAL  2.  Improve L hip abduction strength to 4+/5 for improved stability for gait  Baseline: 3/5 Goal status: INITIAL  3.  Improve walking speed for 10 M walk test to 7 or less for improved community mobility Baseline: 13.1 Goal status: INITIAL  4.  30 sec sit to stand, increase to 7 Baseline: 1 Goal status: INITIAL  PLAN:  PT FREQUENCY: 2x/week  PT DURATION: 8 weeks  PLANNED INTERVENTIONS: Therapeutic exercises, Therapeutic activity, Neuromuscular re-education, Balance training, Gait training, Patient/Family education, Self Care, and Joint mobilization.  PLAN FOR NEXT SESSION: how was dry needling, reassess strength L hip, add other strengthening ex   Dysen Edmondson L Shaneece Stockburger, PT 01/28/2023, 5:06 PM

## 2023-02-04 ENCOUNTER — Other Ambulatory Visit: Payer: Self-pay

## 2023-02-04 ENCOUNTER — Ambulatory Visit: Payer: Medicare HMO | Attending: Family Medicine

## 2023-02-04 DIAGNOSIS — M25511 Pain in right shoulder: Secondary | ICD-10-CM | POA: Insufficient documentation

## 2023-02-04 DIAGNOSIS — R252 Cramp and spasm: Secondary | ICD-10-CM | POA: Insufficient documentation

## 2023-02-04 DIAGNOSIS — M6281 Muscle weakness (generalized): Secondary | ICD-10-CM | POA: Insufficient documentation

## 2023-02-04 DIAGNOSIS — M5416 Radiculopathy, lumbar region: Secondary | ICD-10-CM | POA: Insufficient documentation

## 2023-02-04 DIAGNOSIS — R293 Abnormal posture: Secondary | ICD-10-CM | POA: Insufficient documentation

## 2023-02-04 NOTE — Therapy (Signed)
OUTPATIENT PHYSICAL THERAPY THORACOLUMBAR EVALUATION   Patient Name: Brenda Mcfarland MRN: [id number] DOB:06-08-1946, 77 y.o., female Today's Date: 02/04/2023  END OF SESSION:  PT End of Session - 02/04/23 1539     Visit Number 2    Date for PT Re-Evaluation 03/25/23    PT Start Time 1534    PT Stop Time 1615    PT Time Calculation (min) 41 min    Activity Tolerance Patient tolerated treatment well    Behavior During Therapy [facility] for tasks assessed/performed              Past Medical History:  Diagnosis Date   Abnormal EKG 10/23/2016   Arthritis 01/18/2020   Chronic fatigue 10/08/2015   Depression 01/18/2020   Diabetes mellitus (HCC) 10/08/2015   Formatting of this note might be different from the original. Overview:  last Hgb A1C 7.2/no blood sugars at home Formatting of this note might be different from the original. last Hgb A1C 7.2/no blood sugars at home   ESR raised 11/27/2016   Frequent urination 12/16/2018   Glaucoma 10/08/2015   High cholesterol    History of pancreatitis 07/11/2014   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/23/2016   Hypertension 10/08/2015   IFG (impaired fasting glucose) 10/24/2019   Incomplete emptying of bladder 11/30/2017   Knee pain, left 12/22/2016   Loss of memory 01/18/2020   Major depressive disorder in partial remission (HCC) 10/08/2015   Morbid obesity (HCC) 10/23/2016   Obesity (BMI 30-39.9) 11/10/2016   Obstructive sleep apnea 12/16/2018   Osteoarthritis of both knees 11/27/2016   Other insomnia 03/05/2017   Palpitations 10/23/2016   Positive ANA (antinuclear antibody) 10/23/2016   Sleep apnea 10/08/2015   Formatting of this note might be different from the original. Overview:  does not use CPAP Formatting of this note might be different from the original. does not use CPAP   Sore neck 10/24/2019   Spondylosis of cervical spine with myelopathy 01/18/2020   Type 2 diabetes mellitus without complication, without long-term current use of insulin (HCC) 12/16/2018   Vitamin D  deficiency 10/08/2015   Past Surgical History:  Procedure Laterality Date   APPENDECTOMY     age 51   CESAREAN SECTION     X 3   NECK SURGERY  2015   SPINE SURGERY  05/29/2015   Patient Active Problem List   Diagnosis Date Noted   Strain of lumbar region 01/18/2023   Primary osteoarthritis of right shoulder 12/01/2022   OA (osteoarthritis) of hip 12/01/2022   Nocturia 05/15/2022   Paroxysmal SVT (supraventricular tachycardia) 01/22/2020   Arthritis 01/18/2020   Depression 01/18/2020   Loss of memory 01/18/2020   Spondylosis of cervical spine with myelopathy 01/18/2020   IFG (impaired fasting glucose) 10/24/2019   Frequent urination 12/16/2018   Obstructive sleep apnea 12/16/2018   Type 2 diabetes mellitus without complication, without long-term current use of insulin (HCC) 12/16/2018   Incomplete emptying of bladder 11/30/2017   Insomnia 03/05/2017   ESR raised 11/27/2016   Osteoarthritis of both knees 11/27/2016   Obesity (BMI 30-39.9) 11/10/2016   Abnormal EKG 10/23/2016   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/23/2016   Morbid obesity (HCC) 10/23/2016   Palpitations 10/23/2016   Positive ANA (antinuclear antibody) 10/23/2016   Hypertension 10/08/2015   Chronic fatigue 10/08/2015   Diabetes mellitus (HCC) 10/08/2015   Glaucoma 10/08/2015   Major depressive disorder in partial remission (HCC) 10/08/2015   Vitamin D deficiency 10/08/2015   Sleep apnea 10/08/2015   History of pancreatitis 07/11/2014  PCP: Abbey Chatters, NP  REFERRING PROVIDER: Clementeen Graham, MD  REFERRING DIAG: L lumbar radiculopathy  Rationale for Evaluation and Treatment: Rehabilitation  THERAPY DIAG:  Acute left lumbar radiculopathy  Acute pain of right shoulder  Abnormal posture  Cramp and spasm  Muscle weakness (generalized)  ONSET DATE: 01/05/23  SUBJECTIVE:                                                                                                                                                                                            SUBJECTIVE STATEMENT: 02/04/23:  the needling did help, I felt better after I left here, wasnt as excruciating.  I feel like the exercises aggravated it.  Also still really limited by my vertigo, so not moving around much.  EVAL:The patient reports had spinal manipulation approximately 4 weeks ago, then developed L lateral hip pain.  Injection of her SI jt did not help   PERTINENT HISTORY:  Went to chiropractor, had adjustment that seemed to cause L lateral hip pain. Had injection L SI . Referred to PT   PAIN:  Are you having pain? Yes: NPRS scale: 9/10 Pain location: L lateral hip and thigh, groin Pain description: worse with lifting, moving L leg Aggravating factors: getting in and out of bed, walking, sit to stand Relieving factors: sitting on sofa in a certain position  PRECAUTIONS: Fall and Other: frequent vertigo  WEIGHT BEARING RESTRICTIONS: No  FALLS:  Has patient fallen in last 6 months? Yes. Number of falls 1 . L leg gave away 2 days ago  LIVING ENVIRONMENT: Lives with: lives alone Lives in: House/apartment Stairs: No Has following equipment at home: Single point cane and Environmental consultant - 2 wheeled  OCCUPATION: retired  PLOF: Independent with basic ADLs, Independent with household mobility with device, Independent with gait, and Independent with transfers  PATIENT GOALS: be able to get out of the house, walk I  NEXT MD VISIT: 02/09/23  OBJECTIVE:   DIAGNOSTIC FINDINGS:  L-spine: No acute fractures.  DDD L5-S1.   Left hip: Mild DJD left hip joint.  No acute fractures are visible.  Erosive change present lesser trochanter.  Appears similar to prior x-ray dated December 01, 2022.  PATIENT SURVEYS:  Modified Oswestry 22/50 or 66% disability   SCREENING FOR RED FLAGS: Bowel or bladder incontinence: No Spinal tumors: No Cauda equina syndrome: No Compression fracture: No Abdominal aneurysm: No  COGNITION: Overall cognitive  status: Within functional limits for tasks assessed     SENSATION: WFL  MUSCLE LENGTH: Hamstrings: Right -30 deg; Left wfl deg  POSTURE: rounded shoulders, forward head, and flexed trunk  PALPATION: Hypersensitive/tender L lateral and post hip musculature, pronounced   LUMBAR ROM:   AROM eval  Flexion 40  Extension 40  Right lateral flexion   Left lateral flexion   Right rotation   Left rotation    (Blank rows = not tested)  LOWER EXTREMITY ROM:     Passive  Right eval Left eval  Hip flexion 105 105  Hip extension    Hip abduction    Hip adduction    Hip internal rotation 15 15  Hip external rotation wfl Wfl with pain  Knee flexion    Knee extension    Ankle dorsiflexion    Ankle plantarflexion    Ankle inversion    Ankle eversion     (Blank rows = not tested)  LOWER EXTREMITY MMT:    MMT Right eval Left eval  Hip flexion  P! 3/5  Hip extension    Hip abduction  3/5  Hip adduction    Hip internal rotation    Hip external rotation    Knee flexion    Knee extension  Pain L lat hip 4/5  Ankle dorsiflexion    Ankle plantarflexion    Ankle inversion    Ankle eversion     (Blank rows = not tested)  LUMBAR SPECIAL TESTS:  Straight leg raise test: Negative and Slump test: Positive  FUNCTIONAL TESTS:  30 seconds chair stand test 10 meter walk test: 13.1sec  GAIT: Distance walked: in clinic up to 70' Assistive device utilized: Single point cane Level of assistance: Modified independence Comments: antalgic L  TODAY'S TREATMENT:                                                                                                                              DATE: 02/04/23:Manual: Trigger Point Dry-Needling  Treatment instructions: Expect mild to moderate muscle soreness. S/S of pneumothorax if dry needled over a lung field, and to seek immediate medical attention should they occur. Patient verbalized understanding of these instructions and education. Patient  Consent Given: Yes Education handout provided: Previously provided Muscles treated: L gluteus medius, minimus, TFL, glut max, L vastus lateralis Electrical stimulation performed: No Parameters: N/A Treatment response/outcome: Twitch Response Elicited and Palpable Increase in Muscle Length  Massage with theragun L vastus lateralis, L TFL   Therex: Seated presses into compliant surface Bosu, 15 x each leg  Seated long arc quads with blue theraband, 10 x 2  Seated hip abd/ ER with blue t band resistance 15 x 2 01/28/23:  Manual: Trigger Point Dry-Needling  Treatment instructions: Expect mild to moderate muscle soreness. S/S of pneumothorax if dry needled over a lung field, and to seek immediate medical attention should they occur. Patient verbalized understanding of these instructions and education. Patient Consent Given: Yes Education handout provided: Previously provided Muscles treated: L gluteus medius, minimus, TFL, 6 pts Electrical stimulation performed: No Parameters: N/A Treatment response/outcome: Twitch Response Elicited and Palpable Increase in Muscle  Length   Sidelying R For deep cross friction massage L TFL, glut medius  Therex: instructed in isometrics for hips per HEP below  PATIENT EDUCATION:  Education details: POC  Person educated: Patient Education method: Programmer, multimedia, Demonstration, Actor cues, Verbal cues, and Handouts Education comprehension: verbalized understanding, returned demonstration, and verbal cues required  HOME EXERCISE PROGRAM: Access Code: [facility] URL: [email or web address] Date: 01/28/2023 Prepared by: Tyjae Issa  Exercises - Seated Isometric Hip Abduction with Belt  - 3 x daily - 7 x weekly - 1 sets - 5 reps - 10 sec hold - Seated Isometric Hip Adduction with Ball  - 1 x daily - 7 x weekly - 1 sets - 5 reps - 10 ec hold - Seated Gluteal Sets  - 1 x daily - 7 x weekly - 1 sets - 5 reps - 10 sec  hold  ASSESSMENT:  CLINICAL  IMPRESSION: Patient is a 77 y.o. female who was seen today for physical therapy treatment for L hip/radicular pain. She demonstrated less tissue irritability L lateral hip musculature., reported aggravated by end of session. Advanced some of the exercises for more strength to try to increase her mobility and due to less tender with palpation.  Still limted with movements by chronic vertigo.  Should benefit from skilled PT to address her goals and function, pain.   OBJECTIVE IMPAIRMENTS: decreased activity tolerance, decreased balance, decreased endurance, difficulty walking, decreased ROM, decreased strength, dizziness, and pain.   ACTIVITY LIMITATIONS: carrying, lifting, bending, standing, squatting, sleeping, transfers, and bed mobility  PARTICIPATION LIMITATIONS: laundry, driving, shopping, and community activity  PERSONAL FACTORS: Age, Behavior pattern, Education, Fitness, Past/current experiences, Time since onset of injury/illness/exacerbation, Transportation, and 3+ comorbidities: vertigo, acute and chronic, R cervical radiculopathy, DM  are also affecting patient's functional outcome.   REHAB POTENTIAL: Good  CLINICAL DECISION MAKING: Evolving/moderate complexity  EVALUATION COMPLEXITY: Moderate   GOALS: Goals reviewed with patient? Yes  SHORT TERM GOALS: Target date: 2 weeks, 02/11/23  I HEP  Baseline: Goal status: INITIAL   LONG TERM GOALS: Target date: 8 weeks, 03/25/23  Modified oswestry score:22/50 Baseline: 44/50 Goal status: INITIAL  2.  Improve L hip abduction strength to 4+/5 for improved stability for gait  Baseline: 3/5 Goal status: INITIAL  3.  Improve walking speed for 10 M walk test to 7 or less for improved community mobility Baseline: 13.1 Goal status: INITIAL  4.  30 sec sit to stand, increase to 7 Baseline: 1 Goal status: INITIAL  PLAN:  PT FREQUENCY: 2x/week  PT DURATION: 8 weeks  PLANNED INTERVENTIONS: Therapeutic exercises, Therapeutic  activity, Neuromuscular re-education, Balance training, Gait training, Patient/Family education, Self Care, and Joint mobilization.  PLAN FOR NEXT SESSION: how was dry needling, reassess strength L hip, add other strengthening ex   Preston Weill L Glennice Marcos, PT 02/04/2023, 4:21 PM

## 2023-02-09 ENCOUNTER — Ambulatory Visit: Payer: Medicare HMO | Admitting: Family Medicine

## 2023-02-10 ENCOUNTER — Ambulatory Visit: Payer: Medicare HMO | Admitting: Physical Therapy

## 2023-02-10 ENCOUNTER — Encounter: Payer: Self-pay | Admitting: Physical Therapy

## 2023-02-10 DIAGNOSIS — R252 Cramp and spasm: Secondary | ICD-10-CM | POA: Diagnosis not present

## 2023-02-10 DIAGNOSIS — M5416 Radiculopathy, lumbar region: Secondary | ICD-10-CM | POA: Diagnosis not present

## 2023-02-10 DIAGNOSIS — M6281 Muscle weakness (generalized): Secondary | ICD-10-CM | POA: Diagnosis not present

## 2023-02-10 DIAGNOSIS — M25511 Pain in right shoulder: Secondary | ICD-10-CM | POA: Diagnosis not present

## 2023-02-10 DIAGNOSIS — R293 Abnormal posture: Secondary | ICD-10-CM | POA: Diagnosis not present

## 2023-02-10 NOTE — Therapy (Signed)
OUTPATIENT PHYSICAL THERAPY TREATMENT   Patient Name: Brenda Mcfarland MRN: [id number] DOB:1946-06-30, 77 y.o., female Today's Date: 02/10/2023  END OF SESSION:  PT End of Session - 02/10/23 1529     Visit Number 3    Date for PT Re-Evaluation 03/25/23    Authorization Type [employer] MCR + State BCBS    PT Start Time 1530    PT Stop Time 1617    PT Time Calculation (min) 47 min    Activity Tolerance Patient tolerated treatment well    Behavior During Therapy [facility] for tasks assessed/performed              Past Medical History:  Diagnosis Date   Abnormal EKG 10/23/2016   Arthritis 01/18/2020   Chronic fatigue 10/08/2015   Depression 01/18/2020   Diabetes mellitus (HCC) 10/08/2015   Formatting of this note might be different from the original. Overview:  last Hgb A1C 7.2/no blood sugars at home Formatting of this note might be different from the original. last Hgb A1C 7.2/no blood sugars at home   ESR raised 11/27/2016   Frequent urination 12/16/2018   Glaucoma 10/08/2015   High cholesterol    History of pancreatitis 07/11/2014   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/23/2016   Hypertension 10/08/2015   IFG (impaired fasting glucose) 10/24/2019   Incomplete emptying of bladder 11/30/2017   Knee pain, left 12/22/2016   Loss of memory 01/18/2020   Major depressive disorder in partial remission (HCC) 10/08/2015   Morbid obesity (HCC) 10/23/2016   Obesity (BMI 30-39.9) 11/10/2016   Obstructive sleep apnea 12/16/2018   Osteoarthritis of both knees 11/27/2016   Other insomnia 03/05/2017   Palpitations 10/23/2016   Positive ANA (antinuclear antibody) 10/23/2016   Sleep apnea 10/08/2015   Formatting of this note might be different from the original. Overview:  does not use CPAP Formatting of this note might be different from the original. does not use CPAP   Sore neck 10/24/2019   Spondylosis of cervical spine with myelopathy 01/18/2020   Type 2 diabetes mellitus without complication, without long-term current use of insulin  (HCC) 12/16/2018   Vitamin D deficiency 10/08/2015   Past Surgical History:  Procedure Laterality Date   APPENDECTOMY     age 40   CESAREAN SECTION     X 3   NECK SURGERY  2015   SPINE SURGERY  05/29/2015   Patient Active Problem List   Diagnosis Date Noted   Strain of lumbar region 01/18/2023   Primary osteoarthritis of right shoulder 12/01/2022   OA (osteoarthritis) of hip 12/01/2022   Nocturia 05/15/2022   Paroxysmal SVT (supraventricular tachycardia) 01/22/2020   Arthritis 01/18/2020   Depression 01/18/2020   Loss of memory 01/18/2020   Spondylosis of cervical spine with myelopathy 01/18/2020   IFG (impaired fasting glucose) 10/24/2019   Frequent urination 12/16/2018   Obstructive sleep apnea 12/16/2018   Type 2 diabetes mellitus without complication, without long-term current use of insulin (HCC) 12/16/2018   Incomplete emptying of bladder 11/30/2017   Insomnia 03/05/2017   ESR raised 11/27/2016   Osteoarthritis of both knees 11/27/2016   Obesity (BMI 30-39.9) 11/10/2016   Abnormal EKG 10/23/2016   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/23/2016   Morbid obesity (HCC) 10/23/2016   Palpitations 10/23/2016   Positive ANA (antinuclear antibody) 10/23/2016   Hypertension 10/08/2015   Chronic fatigue 10/08/2015   Diabetes mellitus (HCC) 10/08/2015   Glaucoma 10/08/2015   Major depressive disorder in partial remission (HCC) 10/08/2015   Vitamin D deficiency 10/08/2015  Sleep apnea 10/08/2015   History of pancreatitis 07/11/2014    PCP: Abbey Chatters, NP  REFERRING PROVIDER: Clementeen Graham, MD  REFERRING DIAG: L lumbar radiculopathy  Rationale for Evaluation and Treatment: Rehabilitation  THERAPY DIAG:  Acute left lumbar radiculopathy  ONSET DATE: 01/05/23  SUBJECTIVE:                                                                                                                                                                                           SUBJECTIVE STATEMENT:  02/10/23:  The back is not excrutiating, the vertigo is really bad now, feeling week, feels off all the time.    EVAL:The patient reports had spinal manipulation approximately 4 weeks ago, then developed L lateral hip pain.  Injection of her SI jt did not help   PERTINENT HISTORY:  Went to chiropractor, had adjustment that seemed to cause L lateral hip pain. Had injection L SI . Referred to PT   PAIN:  Are you having pain? Yes: NPRS scale: 2-3/10 Pain location: L lateral hip and thigh, groin Pain description: worse with lifting, moving L leg Aggravating factors: getting in and out of bed, walking, sit to stand Relieving factors: sitting on sofa in a certain position  PRECAUTIONS: Fall and Other: frequent vertigo  WEIGHT BEARING RESTRICTIONS: No  FALLS:  Has patient fallen in last 6 months? Yes. Number of falls 1 . L leg gave away 2 days ago  LIVING ENVIRONMENT: Lives with: lives alone Lives in: House/apartment Stairs: No Has following equipment at home: Single point cane and Environmental consultant - 2 wheeled  OCCUPATION: retired  PLOF: Independent with basic ADLs, Independent with household mobility with device, Independent with gait, and Independent with transfers  PATIENT GOALS: be able to get out of the house, walk I  NEXT MD VISIT: 02/09/23  OBJECTIVE:   DIAGNOSTIC FINDINGS:  L-spine: No acute fractures.  DDD L5-S1.   Left hip: Mild DJD left hip joint.  No acute fractures are visible.  Erosive change present lesser trochanter.  Appears similar to prior x-ray dated December 01, 2022.  PATIENT SURVEYS:  Modified Oswestry 22/50 or 66% disability   SCREENING FOR RED FLAGS: Bowel or bladder incontinence: No Spinal tumors: No Cauda equina syndrome: No Compression fracture: No Abdominal aneurysm: No  COGNITION: Overall cognitive status: Within functional limits for tasks assessed     SENSATION: WFL  MUSCLE LENGTH: Hamstrings: Right -30 deg; Left wfl deg  POSTURE: rounded  shoulders, forward head, and flexed trunk   PALPATION: Hypersensitive/tender L lateral and post hip musculature, pronounced   LUMBAR ROM:   AROM eval  Flexion 40  Extension 40  Right lateral flexion   Left lateral flexion   Right rotation   Left rotation    (Blank rows = not tested)  LOWER EXTREMITY ROM:     Passive  Right eval Left eval  Hip flexion 105 105  Hip extension    Hip abduction    Hip adduction    Hip internal rotation 15 15  Hip external rotation wfl Wfl with pain  Knee flexion    Knee extension    Ankle dorsiflexion    Ankle plantarflexion    Ankle inversion    Ankle eversion     (Blank rows = not tested)  LOWER EXTREMITY MMT:    MMT Right eval Left eval  Hip flexion  P! 3/5  Hip extension    Hip abduction  3/5  Hip adduction    Hip internal rotation    Hip external rotation    Knee flexion    Knee extension  Pain L lat hip 4/5  Ankle dorsiflexion    Ankle plantarflexion    Ankle inversion    Ankle eversion     (Blank rows = not tested)  LUMBAR SPECIAL TESTS:  Straight leg raise test: Negative and Slump test: Positive  FUNCTIONAL TESTS:  30 seconds chair stand test 10 meter walk test: 13.1sec  GAIT: Distance walked: in clinic up to 70' Assistive device utilized: Single point cane Level of assistance: Modified independence Comments: antalgic L   TODAY'S TREATMENT:                                                                                                                              DATE:   02/10/23 Therapeutic Activity:  screening for dizziness/balance concerns.   OCULOMOTOR EXAM:  Ocular Alignment: normal  Ocular ROM: No Limitations  Spontaneous Nystagmus:  appears to have horizontal nystagmus at rest, but stops when asked to focus on object  Gaze-Induced Nystagmus: right beating with right gaze  Smooth Pursuits: intact  Saccades: intact POSITIONAL TESTING: Right Dix-Hallpike: no nystagmus Left Dix-Hallpike: no  nystagmus Right Roll Test: no nystagmus Left Roll Test: no nystagmus  Balance: unable to get into or maintain tandem stance without immediate LOB.  Coordination: intact (fingers to nose with eyes closed, alternating arm and leg movements) Manual Therapy: to decrease muscle spasm and pain and improve mobility STM/TPR to L glutes/piriformis, skilled palpation and monitoring during dry needling. Trigger Point Dry-Needling  Treatment instructions: Expect mild to moderate muscle soreness. S/S of pneumothorax if dry needled over a lung field, and to seek immediate medical attention should they occur. Patient verbalized understanding of these instructions and education. Patient Consent Given: Yes Education handout provided: Previously provided Muscles treated: L piriformis Treatment response/outcome: Twitch Response Elicited and Palpable Increase in Muscle Length   02/04/23:Manual: Trigger Point Dry-Needling  Treatment instructions: Expect mild to moderate muscle soreness. S/S of pneumothorax if dry needled over a lung field, and to seek immediate medical attention  should they occur. Patient verbalized understanding of these instructions and education. Patient Consent Given: Yes Education handout provided: Previously provided Muscles treated: L gluteus medius, minimus, TFL, glut max, L vastus lateralis Electrical stimulation performed: No Parameters: N/A Treatment response/outcome: Twitch Response Elicited and Palpable Increase in Muscle Length  Massage with theragun L vastus lateralis, L TFL   Therex: Seated presses into compliant surface Bosu, 15 x each leg  Seated long arc quads with blue theraband, 10 x 2  Seated hip abd/ ER with blue t band resistance 15 x 2 01/28/23:  Manual: Trigger Point Dry-Needling  Treatment instructions: Expect mild to moderate muscle soreness. S/S of pneumothorax if dry needled over a lung field, and to seek immediate medical attention should they occur. Patient  verbalized understanding of these instructions and education. Patient Consent Given: Yes Education handout provided: Previously provided Muscles treated: L gluteus medius, minimus, TFL, 6 pts Electrical stimulation performed: No Parameters: N/A Treatment response/outcome: Twitch Response Elicited and Palpable Increase in Muscle Length   Sidelying R For deep cross friction massage L TFL, glut medius  Therex: instructed in isometrics for hips per HEP below  PATIENT EDUCATION:  Education details: educated on BE FAST stroke symptoms   Person educated: Patient Education method: Explanation Education comprehension: verbalized understanding  HOME EXERCISE PROGRAM: Access Code: [email or web address] URL: [email or web address] Date: 01/28/2023 Prepared by: Amy Speaks  Exercises - Seated Isometric Hip Abduction with Belt  - 3 x daily - 7 x weekly - 1 sets - 5 reps - 10 sec hold - Seated Isometric Hip Adduction with Ball  - 1 x daily - 7 x weekly - 1 sets - 5 reps - 10 ec hold - Seated Gluteal Sets  - 1 x daily - 7 x weekly - 1 sets - 5 reps - 10 sec  hold  ASSESSMENT:  CLINICAL IMPRESSION: Brenda Mcfarland reports improvement in LBP, has been doing her exercises, meeting STG #1. She was having  significant difficulty due to vertigo today and requesting treatment for that complaint as she had had PT in the past with good improvement.  She com reported feeling weak as well as dizzy, and balance was off, said this has been going on for several months now.  Performed screen of vestibular systems, BPPV testing was negative however limited by neck ROM (history ACDF).  BP was 130/82.  Recommended she make a follow-up visit with her PCP as the dizziness is worsening and her balance is impaired as well, after that could request new order for PT to address vertigo again after she has been cleared by physician.  We then worked on her back, still has a lot of tightness in L piriformis, reported decreased  tightness after interventions.  Brenda Mcfarland continues to demonstrate potential for improvement and would benefit from continued skilled therapy to address impairments.       OBJECTIVE IMPAIRMENTS: decreased activity tolerance, decreased balance, decreased endurance, difficulty walking, decreased ROM, decreased strength, dizziness, and pain.   ACTIVITY LIMITATIONS: carrying, lifting, bending, standing, squatting, sleeping, transfers, and bed mobility  PARTICIPATION LIMITATIONS: laundry, driving, shopping, and community activity  PERSONAL FACTORS: Age, Behavior pattern, Education, Fitness, Past/current experiences, Time since onset of injury/illness/exacerbation, Transportation, and 3+ comorbidities: vertigo, acute and chronic, R cervical radiculopathy, DM  are also affecting patient's functional outcome.   REHAB POTENTIAL: Good  CLINICAL DECISION MAKING: Evolving/moderate complexity  EVALUATION COMPLEXITY: Moderate   GOALS: Goals reviewed with patient? Yes  SHORT TERM GOALS: Target date: 2 weeks, 02/11/23  I HEP  Baseline: Goal status: IN PROGRESS   LONG TERM GOALS: Target date: 8 weeks, 03/25/23  Modified oswestry score:22/50 Baseline: 44/50 Goal status: IN PROGRESS  2.  Improve L hip abduction strength to 4+/5 for improved stability for gait  Baseline: 3/5 Goal status: IN PROGRESS  3.  Improve walking speed for 10 M walk test to 7 or less for improved community mobility Baseline: 13.1 Goal status: IN PROGRESS  4.  30 sec sit to stand, increase to 7 Baseline: 1 Goal status: IN PROGRESS  PLAN:  PT FREQUENCY: 2x/week  PT DURATION: 8 weeks  PLANNED INTERVENTIONS: Therapeutic exercises, Therapeutic activity, Neuromuscular re-education, Balance training, Gait training, Patient/Family education, Self Care, and Joint mobilization.  PLAN FOR NEXT SESSION: how was dry needling, reassess strength L hip, add other strengthening ex   Jena Gauss, PT, DPT   02/10/2023, 5:12 PM

## 2023-02-13 DIAGNOSIS — G4733 Obstructive sleep apnea (adult) (pediatric): Secondary | ICD-10-CM | POA: Diagnosis not present

## 2023-02-15 ENCOUNTER — Encounter: Payer: Self-pay | Admitting: Physical Therapy

## 2023-02-15 ENCOUNTER — Ambulatory Visit: Payer: Medicare HMO | Admitting: Physical Therapy

## 2023-02-15 DIAGNOSIS — M6281 Muscle weakness (generalized): Secondary | ICD-10-CM | POA: Diagnosis not present

## 2023-02-15 DIAGNOSIS — M25511 Pain in right shoulder: Secondary | ICD-10-CM | POA: Diagnosis not present

## 2023-02-15 DIAGNOSIS — R252 Cramp and spasm: Secondary | ICD-10-CM | POA: Diagnosis not present

## 2023-02-15 DIAGNOSIS — M5416 Radiculopathy, lumbar region: Secondary | ICD-10-CM | POA: Diagnosis not present

## 2023-02-15 DIAGNOSIS — R293 Abnormal posture: Secondary | ICD-10-CM | POA: Diagnosis not present

## 2023-02-15 NOTE — Therapy (Signed)
OUTPATIENT PHYSICAL THERAPY TREATMENT   Patient Name: Brenda Mcfarland MRN: [id number] DOB:10-03-1945, 77 y.o., female Today's Date: 02/15/2023  END OF SESSION:  PT End of Session - 02/15/23 1320     Visit Number 4    Date for PT Re-Evaluation 03/25/23    Authorization Type [employer] MCR + State BCBS    PT Start Time 1316    PT Stop Time 1402    PT Time Calculation (min) 46 min    Activity Tolerance Patient tolerated treatment well    Behavior During Therapy WFL for tasks assessed/performed              Past Medical History:  Diagnosis Date   Abnormal EKG 10/23/2016   Arthritis 01/18/2020   Chronic fatigue 10/08/2015   Depression 01/18/2020   Diabetes mellitus (HCC) 10/08/2015   Formatting of this note might be different from the original. Overview:  last Hgb A1C 7.2/no blood sugars at home Formatting of this note might be different from the original. last Hgb A1C 7.2/no blood sugars at home   ESR raised 11/27/2016   Frequent urination 12/16/2018   Glaucoma 10/08/2015   High cholesterol    History of pancreatitis 07/11/2014   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/23/2016   Hypertension 10/08/2015   IFG (impaired fasting glucose) 10/24/2019   Incomplete emptying of bladder 11/30/2017   Knee pain, left 12/22/2016   Loss of memory 01/18/2020   Major depressive disorder in partial remission (HCC) 10/08/2015   Morbid obesity (HCC) 10/23/2016   Obesity (BMI 30-39.9) 11/10/2016   Obstructive sleep apnea 12/16/2018   Osteoarthritis of both knees 11/27/2016   Other insomnia 03/05/2017   Palpitations 10/23/2016   Positive ANA (antinuclear antibody) 10/23/2016   Sleep apnea 10/08/2015   Formatting of this note might be different from the original. Overview:  does not use CPAP Formatting of this note might be different from the original. does not use CPAP   Sore neck 10/24/2019   Spondylosis of cervical spine with myelopathy 01/18/2020   Type 2 diabetes mellitus without complication, without long-term current use of insulin  (HCC) 12/16/2018   Vitamin D deficiency 10/08/2015   Past Surgical History:  Procedure Laterality Date   APPENDECTOMY     age 53   CESAREAN SECTION     X 3   NECK SURGERY  2015   SPINE SURGERY  05/29/2015   Patient Active Problem List   Diagnosis Date Noted   Strain of lumbar region 01/18/2023   Primary osteoarthritis of right shoulder 12/01/2022   OA (osteoarthritis) of hip 12/01/2022   Nocturia 05/15/2022   Paroxysmal SVT (supraventricular tachycardia) 01/22/2020   Arthritis 01/18/2020   Depression 01/18/2020   Loss of memory 01/18/2020   Spondylosis of cervical spine with myelopathy 01/18/2020   IFG (impaired fasting glucose) 10/24/2019   Frequent urination 12/16/2018   Obstructive sleep apnea 12/16/2018   Type 2 diabetes mellitus without complication, without long-term current use of insulin (HCC) 12/16/2018   Incomplete emptying of bladder 11/30/2017   Insomnia 03/05/2017   ESR raised 11/27/2016   Osteoarthritis of both knees 11/27/2016   Obesity (BMI 30-39.9) 11/10/2016   Abnormal EKG 10/23/2016   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/23/2016   Morbid obesity (HCC) 10/23/2016   Palpitations 10/23/2016   Positive ANA (antinuclear antibody) 10/23/2016   Hypertension 10/08/2015   Chronic fatigue 10/08/2015   Diabetes mellitus (HCC) 10/08/2015   Glaucoma 10/08/2015   Major depressive disorder in partial remission (HCC) 10/08/2015   Vitamin D deficiency 10/08/2015  Sleep apnea 10/08/2015   History of pancreatitis 07/11/2014    PCP: Abbey Chatters, NP  REFERRING PROVIDER: Clementeen Graham, MD  REFERRING DIAG: L lumbar radiculopathy  Rationale for Evaluation and Treatment: Rehabilitation  THERAPY DIAG:  Acute left lumbar radiculopathy  ONSET DATE: 01/05/23  SUBJECTIVE:                                                                                                                                                                                           SUBJECTIVE STATEMENT:  02/15/23 -  Yesterday she had so much pain in L hip/groin had to use her walker, today is better but still painful, walking with her cane.   EVAL:The patient reports had spinal manipulation approximately 4 weeks ago, then developed L lateral hip pain.  Injection of her SI jt did not help   PERTINENT HISTORY:  Went to chiropractor, had adjustment that seemed to cause L lateral hip pain. Had injection L SI . Referred to PT   PAIN:  Are you having pain? Yes: NPRS scale: 5/10 Pain location: L lateral hip and thigh, groin Pain description: worse with lifting, moving L leg Aggravating factors: getting in and out of bed, walking, sit to stand Relieving factors: sitting on sofa in a certain position  PRECAUTIONS: Fall and Other: frequent vertigo  WEIGHT BEARING RESTRICTIONS: No  FALLS:  Has patient fallen in last 6 months? Yes. Number of falls 1 . L leg gave away 2 days ago  LIVING ENVIRONMENT: Lives with: lives alone Lives in: House/apartment Stairs: No Has following equipment at home: Single point cane and Environmental consultant - 2 wheeled  OCCUPATION: retired  PLOF: Independent with basic ADLs, Independent with household mobility with device, Independent with gait, and Independent with transfers  PATIENT GOALS: be able to get out of the house, walk I  NEXT MD VISIT: 02/09/23  OBJECTIVE:   DIAGNOSTIC FINDINGS:  L-spine: No acute fractures.  DDD L5-S1.   Left hip: Mild DJD left hip joint.  No acute fractures are visible.  Erosive change present lesser trochanter.  Appears similar to prior x-ray dated December 01, 2022.  PATIENT SURVEYS:  Modified Oswestry 22/50 or 66% disability   SCREENING FOR RED FLAGS: Bowel or bladder incontinence: No Spinal tumors: No Cauda equina syndrome: No Compression fracture: No Abdominal aneurysm: No  COGNITION: Overall cognitive status: Within functional limits for tasks assessed     SENSATION: WFL  MUSCLE LENGTH: Hamstrings: Right -30 deg; Left wfl  deg  POSTURE: rounded shoulders, forward head, and flexed trunk   PALPATION: Hypersensitive/tender L lateral and post hip musculature, pronounced   LUMBAR ROM:  AROM eval  Flexion 40  Extension 40  Right lateral flexion   Left lateral flexion   Right rotation   Left rotation    (Blank rows = not tested)  LOWER EXTREMITY ROM:     Passive  Right eval Left eval  Hip flexion 105 105  Hip extension    Hip abduction    Hip adduction    Hip internal rotation 15 15  Hip external rotation wfl Wfl with pain  Knee flexion    Knee extension    Ankle dorsiflexion    Ankle plantarflexion    Ankle inversion    Ankle eversion     (Blank rows = not tested)  LOWER EXTREMITY MMT:    MMT Right eval Left eval  Hip flexion  P! 3/5  Hip extension    Hip abduction  3/5  Hip adduction    Hip internal rotation    Hip external rotation    Knee flexion    Knee extension  Pain L lat hip 4/5  Ankle dorsiflexion    Ankle plantarflexion    Ankle inversion    Ankle eversion     (Blank rows = not tested)  LUMBAR SPECIAL TESTS:  Straight leg raise test: Negative and Slump test: Positive  FUNCTIONAL TESTS:  30 seconds chair stand test 10 meter walk test: 13.1sec  GAIT: Distance walked: in clinic up to 70' Assistive device utilized: Single point cane Level of assistance: Modified independence Comments: antalgic L   TODAY'S TREATMENT:                                                                                                                              DATE:   02/15/23 Therapeutic Exercise: to improve strength and mobility.  Demo, verbal and tactile cues throughout for technique. Prone lying x 3 min Prone knee bends x 10 bil  Prone on elbows x 3 min Supine PPT x 10 LTR x 10 L hip flexor stretch Manual Therapy: to decrease muscle spasm and pain and improve mobility L UPA mobs to lumbar spine, STM/TPR to L QL, lumbar paraspinals, glutes/piriformis, skilled palpation and  monitoring during dry needling. Trigger Point Dry-Needling  Treatment instructions: Expect mild to moderate muscle soreness. S/S of pneumothorax if dry needled over a lung field, and to seek immediate medical attention should they occur. Patient verbalized understanding of these instructions and education. Patient Consent Given: Yes Education handout provided: Previously provided Muscles treated: L glute med,  L piriformis Treatment response/outcome: Twitch Response Elicited and Palpable Increase in Muscle Length  02/10/23 Therapeutic Activity:  screening for dizziness/balance concerns.   OCULOMOTOR EXAM:  Ocular Alignment: normal  Ocular ROM: No Limitations  Spontaneous Nystagmus:  appears to have horizontal nystagmus at rest, but stops when asked to focus on object  Gaze-Induced Nystagmus: right beating with right gaze  Smooth Pursuits: intact  Saccades: intact POSITIONAL TESTING: Right Dix-Hallpike: no nystagmus Left Dix-Hallpike: no nystagmus Right Roll  Test: no nystagmus Left Roll Test: no nystagmus  Balance: unable to get into or maintain tandem stance without immediate LOB.  Coordination: intact (fingers to nose with eyes closed, alternating arm and leg movements) Manual Therapy: to decrease muscle spasm and pain and improve mobility STM/TPR to L glutes/piriformis, skilled palpation and monitoring during dry needling. Trigger Point Dry-Needling  Treatment instructions: Expect mild to moderate muscle soreness. S/S of pneumothorax if dry needled over a lung field, and to seek immediate medical attention should they occur. Patient verbalized understanding of these instructions and education. Patient Consent Given: Yes Education handout provided: Previously provided Muscles treated: L piriformis Treatment response/outcome: Twitch Response Elicited and Palpable Increase in Muscle Length   02/04/23:Manual: Trigger Point Dry-Needling  Treatment instructions: Expect mild to moderate  muscle soreness. S/S of pneumothorax if dry needled over a lung field, and to seek immediate medical attention should they occur. Patient verbalized understanding of these instructions and education. Patient Consent Given: Yes Education handout provided: Previously provided Muscles treated: L gluteus medius, minimus, TFL, glut max, L vastus lateralis Electrical stimulation performed: No Parameters: N/A Treatment response/outcome: Twitch Response Elicited and Palpable Increase in Muscle Length  Massage with theragun L vastus lateralis, L TFL   Therex: Seated presses into compliant surface Bosu, 15 x each leg  Seated long arc quads with blue theraband, 10 x 2  Seated hip abd/ ER with blue t band resistance 15 x 2 01/28/23:  Manual: Trigger Point Dry-Needling  Treatment instructions: Expect mild to moderate muscle soreness. S/S of pneumothorax if dry needled over a lung field, and to seek immediate medical attention should they occur. Patient verbalized understanding of these instructions and education. Patient Consent Given: Yes Education handout provided: Previously provided Muscles treated: L gluteus medius, minimus, TFL, 6 pts Electrical stimulation performed: No Parameters: N/A Treatment response/outcome: Twitch Response Elicited and Palpable Increase in Muscle Length   Sidelying R For deep cross friction massage L TFL, glut medius  Therex: instructed in isometrics for hips per HEP below  PATIENT EDUCATION:  Education details: educated on BE FAST stroke symptoms   Person educated: Patient Education method: Explanation Education comprehension: verbalized understanding  HOME EXERCISE PROGRAM: Access Code: [email or web address] URL: [email or web address] Date: 01/28/2023 Prepared by: Amy Speaks  Exercises - Seated Isometric Hip Abduction with Belt  - 3 x daily - 7 x weekly - 1 sets - 5 reps - 10 sec hold - Seated Isometric Hip Adduction with Ball  - 1 x daily - 7 x weekly - 1  sets - 5 reps - 10 ec hold - Seated Gluteal Sets  - 1 x daily - 7 x weekly - 1 sets - 5 reps - 10 sec  hold  ASSESSMENT:  CLINICAL IMPRESSION: Brenda Mcfarland reported increased L LBP radiating into L hip/groin starting this weekend, taken through [facility] extension protocol starting with prone lying, the prone on elbows, and knee bends, followed by manual therapy including TrDN which significantly decreased her pain, she reported not needing the cane after interventions today.  Also started core strengthening.  Brenda Mcfarland continues to demonstrate potential for improvement and would benefit from continued skilled therapy to address impairments.       OBJECTIVE IMPAIRMENTS: decreased activity tolerance, decreased balance, decreased endurance, difficulty walking, decreased ROM, decreased strength, dizziness, and pain.   ACTIVITY LIMITATIONS: carrying, lifting, bending, standing, squatting, sleeping, transfers, and bed mobility  PARTICIPATION LIMITATIONS: laundry, driving, shopping, and community activity  PERSONAL FACTORS: Age, Behavior pattern, Education, Fitness, Past/current experiences,  Time since onset of injury/illness/exacerbation, Transportation, and 3+ comorbidities: vertigo, acute and chronic, R cervical radiculopathy, DM  are also affecting patient's functional outcome.   REHAB POTENTIAL: Good  CLINICAL DECISION MAKING: Evolving/moderate complexity  EVALUATION COMPLEXITY: Moderate   GOALS: Goals reviewed with patient? Yes  SHORT TERM GOALS: Target date: 2 weeks, 02/11/23  I HEP  Baseline: Goal status: IN PROGRESS   LONG TERM GOALS: Target date: 8 weeks, 03/25/23  Modified oswestry score:22/50 Baseline: 44/50 Goal status: IN PROGRESS  2.  Improve L hip abduction strength to 4+/5 for improved stability for gait  Baseline: 3/5 Goal status: IN PROGRESS  3.  Improve walking speed for 10 M walk test to 7 or less for improved community mobility Baseline: 13.1 Goal  status: IN PROGRESS  4.  30 sec sit to stand, increase to 7 Baseline: 1 Goal status: IN PROGRESS  PLAN:  PT FREQUENCY: 2x/week  PT DURATION: 8 weeks  PLANNED INTERVENTIONS: Therapeutic exercises, Therapeutic activity, Neuromuscular re-education, Balance training, Gait training, Patient/Family education, Self Care, and Joint mobilization.  PLAN FOR NEXT SESSION:  reassess strength L hip, add other strengthening ex.  Need to update HEP for prone and NS exercises.    Jena Gauss, PT, DPT  02/15/2023, 6:16 PM

## 2023-02-18 ENCOUNTER — Ambulatory Visit: Payer: Medicare HMO

## 2023-02-18 ENCOUNTER — Other Ambulatory Visit: Payer: Self-pay

## 2023-02-18 DIAGNOSIS — R252 Cramp and spasm: Secondary | ICD-10-CM | POA: Diagnosis not present

## 2023-02-18 DIAGNOSIS — M5416 Radiculopathy, lumbar region: Secondary | ICD-10-CM | POA: Diagnosis not present

## 2023-02-18 DIAGNOSIS — M6281 Muscle weakness (generalized): Secondary | ICD-10-CM | POA: Diagnosis not present

## 2023-02-18 DIAGNOSIS — R293 Abnormal posture: Secondary | ICD-10-CM | POA: Diagnosis not present

## 2023-02-18 DIAGNOSIS — M25511 Pain in right shoulder: Secondary | ICD-10-CM | POA: Diagnosis not present

## 2023-02-18 NOTE — Therapy (Signed)
OUTPATIENT PHYSICAL THERAPY TREATMENT   Patient Name: Brenda Mcfarland MRN: [id number] DOB:1946/01/17, 77 y.o., female Today's Date: 02/18/2023  END OF SESSION:  PT End of Session - 02/18/23 1142     Visit Number 5    Date for PT Re-Evaluation 03/25/23    Progress Note Due on Visit 10    PT Start Time 1145    PT Stop Time 1230    PT Time Calculation (min) 45 min    Activity Tolerance Patient tolerated treatment well    Behavior During Therapy [facility] for tasks assessed/performed               Past Medical History:  Diagnosis Date   Abnormal EKG 10/23/2016   Arthritis 01/18/2020   Chronic fatigue 10/08/2015   Depression 01/18/2020   Diabetes mellitus (HCC) 10/08/2015   Formatting of this note might be different from the original. Overview:  last Hgb A1C 7.2/no blood sugars at home Formatting of this note might be different from the original. last Hgb A1C 7.2/no blood sugars at home   ESR raised 11/27/2016   Frequent urination 12/16/2018   Glaucoma 10/08/2015   High cholesterol    History of pancreatitis 07/11/2014   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/23/2016   Hypertension 10/08/2015   IFG (impaired fasting glucose) 10/24/2019   Incomplete emptying of bladder 11/30/2017   Knee pain, left 12/22/2016   Loss of memory 01/18/2020   Major depressive disorder in partial remission (HCC) 10/08/2015   Morbid obesity (HCC) 10/23/2016   Obesity (BMI 30-39.9) 11/10/2016   Obstructive sleep apnea 12/16/2018   Osteoarthritis of both knees 11/27/2016   Other insomnia 03/05/2017   Palpitations 10/23/2016   Positive ANA (antinuclear antibody) 10/23/2016   Sleep apnea 10/08/2015   Formatting of this note might be different from the original. Overview:  does not use CPAP Formatting of this note might be different from the original. does not use CPAP   Sore neck 10/24/2019   Spondylosis of cervical spine with myelopathy 01/18/2020   Type 2 diabetes mellitus without complication, without long-term current use of insulin (HCC)  12/16/2018   Vitamin D deficiency 10/08/2015   Past Surgical History:  Procedure Laterality Date   APPENDECTOMY     age 22   CESAREAN SECTION     X 3   NECK SURGERY  2015   SPINE SURGERY  05/29/2015   Patient Active Problem List   Diagnosis Date Noted   Strain of lumbar region 01/18/2023   Primary osteoarthritis of right shoulder 12/01/2022   OA (osteoarthritis) of hip 12/01/2022   Nocturia 05/15/2022   Paroxysmal SVT (supraventricular tachycardia) 01/22/2020   Arthritis 01/18/2020   Depression 01/18/2020   Loss of memory 01/18/2020   Spondylosis of cervical spine with myelopathy 01/18/2020   IFG (impaired fasting glucose) 10/24/2019   Frequent urination 12/16/2018   Obstructive sleep apnea 12/16/2018   Type 2 diabetes mellitus without complication, without long-term current use of insulin (HCC) 12/16/2018   Incomplete emptying of bladder 11/30/2017   Insomnia 03/05/2017   ESR raised 11/27/2016   Osteoarthritis of both knees 11/27/2016   Obesity (BMI 30-39.9) 11/10/2016   Abnormal EKG 10/23/2016   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/23/2016   Morbid obesity (HCC) 10/23/2016   Palpitations 10/23/2016   Positive ANA (antinuclear antibody) 10/23/2016   Hypertension 10/08/2015   Chronic fatigue 10/08/2015   Diabetes mellitus (HCC) 10/08/2015   Glaucoma 10/08/2015   Major depressive disorder in partial remission (HCC) 10/08/2015   Vitamin D deficiency 10/08/2015  Sleep apnea 10/08/2015   History of pancreatitis 07/11/2014    PCP: Abbey Chatters, NP  REFERRING PROVIDER: Clementeen Graham, MD  REFERRING DIAG: L lumbar radiculopathy  Rationale for Evaluation and Treatment: Rehabilitation  THERAPY DIAG:  Acute left lumbar radiculopathy  Abnormal posture  Muscle weakness (generalized)  ONSET DATE: 01/05/23  SUBJECTIVE:                                                                                                                                                                                            SUBJECTIVE STATEMENT: 02/18/23 -  Still in a lot of pain, using st cane, initial step is excruciating.  Doesn't feel like any better.  Feels good for a few mins after Rx sessions here then pain again  EVAL:The patient reports had spinal manipulation approximately 4 weeks ago, then developed L lateral hip pain.  Injection of her SI jt did not help   PERTINENT HISTORY:  Went to chiropractor, had adjustment that seemed to cause L lateral hip pain. Had injection L SI . Referred to PT   PAIN:  Are you having pain? Yes: NPRS scale: 5/10 Pain location: L lateral hip and thigh, groin Pain description: worse with lifting, moving L leg Aggravating factors: getting in and out of bed, walking, sit to stand Relieving factors: sitting on sofa in a certain position  PRECAUTIONS: Fall and Other: frequent vertigo  WEIGHT BEARING RESTRICTIONS: No  FALLS:  Has patient fallen in last 6 months? Yes. Number of falls 1 . L leg gave away 2 days ago  LIVING ENVIRONMENT: Lives with: lives alone Lives in: House/apartment Stairs: No Has following equipment at home: Single point cane and Environmental consultant - 2 wheeled  OCCUPATION: retired  PLOF: Independent with basic ADLs, Independent with household mobility with device, Independent with gait, and Independent with transfers  PATIENT GOALS: be able to get out of the house, walk I  NEXT MD VISIT: 02/09/23  OBJECTIVE:   DIAGNOSTIC FINDINGS:  L-spine: No acute fractures.  DDD L5-S1.   Left hip: Mild DJD left hip joint.  No acute fractures are visible.  Erosive change present lesser trochanter.  Appears similar to prior x-ray dated December 01, 2022.  PATIENT SURVEYS:  Modified Oswestry 22/50 or 66% disability   SCREENING FOR RED FLAGS: Bowel or bladder incontinence: No Spinal tumors: No Cauda equina syndrome: No Compression fracture: No Abdominal aneurysm: No  COGNITION: Overall cognitive status: Within functional limits for tasks  assessed     SENSATION: WFL  MUSCLE LENGTH: Hamstrings: Right -30 deg; Left wfl deg  POSTURE: rounded shoulders, forward head, and flexed trunk  PALPATION: Hypersensitive/tender L lateral and post hip musculature, pronounced   LUMBAR ROM:   AROM eval  Flexion 40  Extension 40  Right lateral flexion   Left lateral flexion   Right rotation   Left rotation    (Blank rows = not tested)  LOWER EXTREMITY ROM:     Passive  Right eval Left eval  Hip flexion 105 105  Hip extension    Hip abduction    Hip adduction    Hip internal rotation 15 15  Hip external rotation wfl Wfl with pain  Knee flexion    Knee extension    Ankle dorsiflexion    Ankle plantarflexion    Ankle inversion    Ankle eversion     (Blank rows = not tested)  LOWER EXTREMITY MMT:    MMT Right eval Left eval  Hip flexion  P! 3/5  Hip extension    Hip abduction  3/5  Hip adduction    Hip internal rotation    Hip external rotation    Knee flexion    Knee extension  Pain L lat hip 4/5  Ankle dorsiflexion    Ankle plantarflexion    Ankle inversion    Ankle eversion     (Blank rows = not tested)  LUMBAR SPECIAL TESTS:  Straight leg raise test: Negative and Slump test: Positive  FUNCTIONAL TESTS:  30 seconds chair stand test 10 meter walk test: 13.1sec  GAIT: Distance walked: in clinic up to 70' Assistive device utilized: Single point cane Level of assistance: Modified independence Comments: antalgic L   TODAY'S TREATMENT:                                                                                                                              DATE:   02/18/23: Manual: Initially assessed patient's hip ROM to rule out additional jt issue, no pain with hip IR /ER in sitting and supine.  Prone for deep cross friction massage L gluteals, piriformis, glut med and minimus Trigger Point Dry-Needling  Treatment instructions: Expect mild to moderate muscle soreness. S/S of pneumothorax  if dry needled over a lung field, and to seek immediate medical attention should they occur. Patient verbalized understanding of these instructions and education. Patient Consent Given: Yes Education handout provided: Previously provided Muscles treated: L gluteus medius, L piriformis, L glut max Electrical stimulation performed: No Parameters: N/A Treatment response/outcome: Twitch Response Elicited and Palpable Increase in Muscle Length  L sidelying with 3 pillows between knees, so that hip in slightly abducted position, for kinesiotaping, to support glut med and TFL, extending from mid lateral thigh over IT band to medial, middle and lateral gluteus medius origins on iliac crest, 3 I pieces, pull distal to proximal 25%.   Therex:  instructed in prone heel squeezes with knees extended to engage multifidus and stabilize LS spine.5 sec holds, 10 reps  Also reviewed prone props on elbows, patient performing at home as  well as prone hamstring curls   02/15/23 Therapeutic Exercise: to improve strength and mobility.  Demo, verbal and tactile cues throughout for technique. Prone lying x 3 min Prone knee bends x 10 bil  Prone on elbows x 3 min Supine PPT x 10 LTR x 10 L hip flexor stretch Manual Therapy: to decrease muscle spasm and pain and improve mobility L UPA mobs to lumbar spine, STM/TPR to L QL, lumbar paraspinals, glutes/piriformis, skilled palpation and monitoring during dry needling. Trigger Point Dry-Needling  Treatment instructions: Expect mild to moderate muscle soreness. S/S of pneumothorax if dry needled over a lung field, and to seek immediate medical attention should they occur. Patient verbalized understanding of these instructions and education. Patient Consent Given: Yes Education handout provided: Previously provided Muscles treated: L glute med,  L piriformis Treatment response/outcome: Twitch Response Elicited and Palpable Increase in Muscle Length  02/10/23 Therapeutic  Activity:  screening for dizziness/balance concerns.   OCULOMOTOR EXAM:  Ocular Alignment: normal  Ocular ROM: No Limitations  Spontaneous Nystagmus:  appears to have horizontal nystagmus at rest, but stops when asked to focus on object  Gaze-Induced Nystagmus: right beating with right gaze  Smooth Pursuits: intact  Saccades: intact POSITIONAL TESTING: Right Dix-Hallpike: no nystagmus Left Dix-Hallpike: no nystagmus Right Roll Test: no nystagmus Left Roll Test: no nystagmus  Balance: unable to get into or maintain tandem stance without immediate LOB.  Coordination: intact (fingers to nose with eyes closed, alternating arm and leg movements) Manual Therapy: to decrease muscle spasm and pain and improve mobility STM/TPR to L glutes/piriformis, skilled palpation and monitoring during dry needling. Trigger Point Dry-Needling  Treatment instructions: Expect mild to moderate muscle soreness. S/S of pneumothorax if dry needled over a lung field, and to seek immediate medical attention should they occur. Patient verbalized understanding of these instructions and education. Patient Consent Given: Yes Education handout provided: Previously provided Muscles treated: L piriformis Treatment response/outcome: Twitch Response Elicited and Palpable Increase in Muscle Length   02/04/23:Manual: Trigger Point Dry-Needling  Treatment instructions: Expect mild to moderate muscle soreness. S/S of pneumothorax if dry needled over a lung field, and to seek immediate medical attention should they occur. Patient verbalized understanding of these instructions and education. Patient Consent Given: Yes Education handout provided: Previously provided Muscles treated: L gluteus medius, minimus, TFL, glut max, L vastus lateralis Electrical stimulation performed: No Parameters: N/A Treatment response/outcome: Twitch Response Elicited and Palpable Increase in Muscle Length  Massage with theragun L vastus lateralis, L  TFL   Therex: Seated presses into compliant surface Bosu, 15 x each leg  Seated long arc quads with blue theraband, 10 x 2  Seated hip abd/ ER with blue t band resistance 15 x 2 01/28/23:  Manual: Trigger Point Dry-Needling  Treatment instructions: Expect mild to moderate muscle soreness. S/S of pneumothorax if dry needled over a lung field, and to seek immediate medical attention should they occur. Patient verbalized understanding of these instructions and education. Patient Consent Given: Yes Education handout provided: Previously provided Muscles treated: L gluteus medius, minimus, TFL, 6 pts Electrical stimulation performed: No Parameters: N/A Treatment response/outcome: Twitch Response Elicited and Palpable Increase in Muscle Length   Sidelying R For deep cross friction massage L TFL, glut medius  Therex: instructed in isometrics for hips per HEP below  PATIENT EDUCATION:  Education details: educated on BE FAST stroke symptoms   Person educated: Patient Education method: Explanation Education comprehension: verbalized understanding  HOME EXERCISE PROGRAM: Access Code: [email or web address] URL: [email or web address]  Date: 01/28/2023 Prepared by: Caralee Ates  Exercises - Seated Isometric Hip Abduction with Belt  - 3 x daily - 7 x weekly - 1 sets - 5 reps - 10 sec hold - Seated Isometric Hip Adduction with Ball  - 1 x daily - 7 x weekly - 1 sets - 5 reps - 10 ec hold - Seated Gluteal Sets  - 1 x daily - 7 x weekly - 1 sets - 5 reps - 10 sec  hold  ASSESSMENT:  CLINICAL IMPRESSION: Anacelia Meikle reported continued ongoing pain L lateral/ post hip, sometimes radiating into groin, particularly painful with initial contact L LE with walking.  Had antalgic gait on L when walking into clinic today.  Did utilize some manual techniques with improvement I her deep pain.  Added the kinesiotaping today to support her L hip abductors and much improved gait after session.  Also added to  her  core strengthening.  Marlyss Calderone continues to demonstrate potential for improvement and would benefit from continued skilled therapy to address impairments.  We did discuss follow up with MD due to short lasting relief post sessions. She has a follow up in 10 days.       OBJECTIVE IMPAIRMENTS: decreased activity tolerance, decreased balance, decreased endurance, difficulty walking, decreased ROM, decreased strength, dizziness, and pain.   ACTIVITY LIMITATIONS: carrying, lifting, bending, standing, squatting, sleeping, transfers, and bed mobility  PARTICIPATION LIMITATIONS: laundry, driving, shopping, and community activity  PERSONAL FACTORS: Age, Behavior pattern, Education, Fitness, Past/current experiences, Time since onset of injury/illness/exacerbation, Transportation, and 3+ comorbidities: vertigo, acute and chronic, R cervical radiculopathy, DM  are also affecting patient's functional outcome.   REHAB POTENTIAL: Good  CLINICAL DECISION MAKING: Evolving/moderate complexity  EVALUATION COMPLEXITY: Moderate   GOALS: Goals reviewed with patient? Yes  SHORT TERM GOALS: Target date: 2 weeks, 02/11/23  I HEP  Baseline: Goal status: IN PROGRESS   LONG TERM GOALS: Target date: 8 weeks, 03/25/23  Modified oswestry score:22/50 Baseline: 44/50 Goal status: IN PROGRESS  2.  Improve L hip abduction strength to 4+/5 for improved stability for gait  Baseline: 3/5 Goal status: IN PROGRESS  3.  Improve walking speed for 10 M walk test to 7 or less for improved community mobility Baseline: 13.1 Goal status: IN PROGRESS  4.  30 sec sit to stand, increase to 7 Baseline: 1 Goal status: IN PROGRESS  PLAN:  PT FREQUENCY: 2x/week  PT DURATION: 8 weeks  PLANNED INTERVENTIONS: Therapeutic exercises, Therapeutic activity, Neuromuscular re-education, Balance training, Gait training, Patient/Family education, Self Care, and Joint mobilization.  PLAN FOR NEXT SESSION:  reassess  strength L hip, add other strengthening ex.  Need to update HEP for prone and NS exercises.    Devi Hopman Frazier Richards, PT, DPT  02/18/2023, 12:57 PM

## 2023-02-23 ENCOUNTER — Ambulatory Visit (INDEPENDENT_AMBULATORY_CARE_PROVIDER_SITE_OTHER): Payer: Medicare HMO | Admitting: Medical

## 2023-02-23 ENCOUNTER — Encounter: Payer: Self-pay | Admitting: Medical

## 2023-02-23 ENCOUNTER — Ambulatory Visit: Payer: Medicare HMO

## 2023-02-23 ENCOUNTER — Encounter: Payer: Self-pay | Admitting: Nurse Practitioner

## 2023-02-23 ENCOUNTER — Other Ambulatory Visit: Payer: Self-pay

## 2023-02-23 VITALS — BP 120/70 | HR 80 | Resp 18 | Ht 67.0 in | Wt 243.0 lb

## 2023-02-23 DIAGNOSIS — M5416 Radiculopathy, lumbar region: Secondary | ICD-10-CM

## 2023-02-23 DIAGNOSIS — R739 Hyperglycemia, unspecified: Secondary | ICD-10-CM

## 2023-02-23 DIAGNOSIS — R252 Cramp and spasm: Secondary | ICD-10-CM | POA: Diagnosis not present

## 2023-02-23 DIAGNOSIS — R293 Abnormal posture: Secondary | ICD-10-CM

## 2023-02-23 DIAGNOSIS — M5432 Sciatica, left side: Secondary | ICD-10-CM | POA: Diagnosis not present

## 2023-02-23 DIAGNOSIS — M25552 Pain in left hip: Secondary | ICD-10-CM | POA: Diagnosis not present

## 2023-02-23 DIAGNOSIS — R42 Dizziness and giddiness: Secondary | ICD-10-CM | POA: Diagnosis not present

## 2023-02-23 DIAGNOSIS — M6281 Muscle weakness (generalized): Secondary | ICD-10-CM

## 2023-02-23 DIAGNOSIS — I10 Essential (primary) hypertension: Secondary | ICD-10-CM

## 2023-02-23 DIAGNOSIS — M25511 Pain in right shoulder: Secondary | ICD-10-CM | POA: Diagnosis not present

## 2023-02-23 LAB — CBC WITH DIFFERENTIAL/PLATELET
Basophils Absolute: 0 10*3/uL (ref 0.0–0.1)
Basophils Relative: 0.4 % (ref 0.0–3.0)
Eosinophils Absolute: 0.1 10*3/uL (ref 0.0–0.7)
Eosinophils Relative: 1.6 % (ref 0.0–5.0)
HCT: 41.3 % (ref 36.0–46.0)
Hemoglobin: 13.8 g/dL (ref 12.0–15.0)
Lymphocytes Relative: 23.9 % (ref 12.0–46.0)
Lymphs Abs: 1.7 10*3/uL (ref 0.7–4.0)
MCHC: 33.5 g/dL (ref 30.0–36.0)
MCV: 94.8 fl (ref 78.0–100.0)
Monocytes Absolute: 0.7 10*3/uL (ref 0.1–1.0)
Monocytes Relative: 9.8 % (ref 3.0–12.0)
Neutro Abs: 4.7 10*3/uL (ref 1.4–7.7)
Neutrophils Relative %: 64.3 % (ref 43.0–77.0)
Platelets: 256 10*3/uL (ref 150.0–400.0)
RBC: 4.36 Mil/uL (ref 3.87–5.11)
RDW: 13.4 % (ref 11.5–15.5)
WBC: 7.3 10*3/uL (ref 4.0–10.5)

## 2023-02-23 LAB — COMPREHENSIVE METABOLIC PANEL
ALT: 16 U/L (ref 0–35)
AST: 13 U/L (ref 0–37)
Albumin: 4.1 g/dL (ref 3.5–5.2)
Alkaline Phosphatase: 60 U/L (ref 39–117)
BUN: 24 mg/dL — ABNORMAL HIGH (ref 6–23)
CO2: 25 mEq/L (ref 19–32)
Calcium: 9.2 mg/dL (ref 8.4–10.5)
Chloride: 106 mEq/L (ref 96–112)
Creatinine, Ser: 0.74 mg/dL (ref 0.40–1.20)
GFR: 77.67 mL/min (ref >60.00)
Glucose, Bld: 123 mg/dL — ABNORMAL HIGH (ref 70–99)
Potassium: 4.2 mEq/L (ref 3.5–5.1)
Sodium: 139 mEq/L (ref 135–145)
Total Bilirubin: 0.6 mg/dL (ref 0.2–1.2)
Total Protein: 6.5 g/dL (ref 6.0–8.3)

## 2023-02-23 LAB — HEMOGLOBIN A1C: Hgb A1c MFr Bld: 7 % — ABNORMAL HIGH (ref 4.6–6.5)

## 2023-02-23 MED ORDER — KETOROLAC TROMETHAMINE 30 MG/ML IJ SOLN
30.0000 mg | Freq: Once | INTRAMUSCULAR | Status: AC
Start: 2023-02-23 — End: 2023-02-23
  Administered 2023-02-23: 30 mg via INTRAMUSCULAR

## 2023-02-23 NOTE — Therapy (Signed)
OUTPATIENT PHYSICAL THERAPY TREATMENT   Patient Name: Brenda Mcfarland MRN: [id number] DOB:01-Mar-1946, 77 y.o., female Today's Date: 02/23/2023  END OF SESSION:  PT End of Session - 02/23/23 1404     Visit Number 6    Date for PT Re-Evaluation 03/25/23    Progress Note Due on Visit 10    PT Start Time 1604    PT Stop Time 1645    PT Time Calculation (min) 41 min    Activity Tolerance Patient tolerated treatment well    Behavior During Therapy [facility] for tasks assessed/performed               Past Medical History:  Diagnosis Date   Abnormal EKG 10/23/2016   Arthritis 01/18/2020   Chronic fatigue 10/08/2015   Depression 01/18/2020   Diabetes mellitus (HCC) 10/08/2015   Formatting of this note might be different from the original. Overview:  last Hgb A1C 7.2/no blood sugars at home Formatting of this note might be different from the original. last Hgb A1C 7.2/no blood sugars at home   ESR raised 11/27/2016   Frequent urination 12/16/2018   Glaucoma 10/08/2015   High cholesterol    History of pancreatitis 07/11/2014   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/23/2016   Hypertension 10/08/2015   IFG (impaired fasting glucose) 10/24/2019   Incomplete emptying of bladder 11/30/2017   Knee pain, left 12/22/2016   Loss of memory 01/18/2020   Major depressive disorder in partial remission (HCC) 10/08/2015   Morbid obesity (HCC) 10/23/2016   Obesity (BMI 30-39.9) 11/10/2016   Obstructive sleep apnea 12/16/2018   Osteoarthritis of both knees 11/27/2016   Other insomnia 03/05/2017   Palpitations 10/23/2016   Positive ANA (antinuclear antibody) 10/23/2016   Sleep apnea 10/08/2015   Formatting of this note might be different from the original. Overview:  does not use CPAP Formatting of this note might be different from the original. does not use CPAP   Sore neck 10/24/2019   Spondylosis of cervical spine with myelopathy 01/18/2020   Type 2 diabetes mellitus without complication, without long-term current use of insulin (HCC)  12/16/2018   Vitamin D deficiency 10/08/2015   Past Surgical History:  Procedure Laterality Date   APPENDECTOMY     age 89   CESAREAN SECTION     X 3   NECK SURGERY  2015   SPINE SURGERY  05/29/2015   Patient Active Problem List   Diagnosis Date Noted   Strain of lumbar region 01/18/2023   Primary osteoarthritis of right shoulder 12/01/2022   OA (osteoarthritis) of hip 12/01/2022   Nocturia 05/15/2022   Paroxysmal SVT (supraventricular tachycardia) 01/22/2020   Arthritis 01/18/2020   Depression 01/18/2020   Loss of memory 01/18/2020   Spondylosis of cervical spine with myelopathy 01/18/2020   IFG (impaired fasting glucose) 10/24/2019   Frequent urination 12/16/2018   Obstructive sleep apnea 12/16/2018   Type 2 diabetes mellitus without complication, without long-term current use of insulin (HCC) 12/16/2018   Incomplete emptying of bladder 11/30/2017   Insomnia 03/05/2017   ESR raised 11/27/2016   Osteoarthritis of both knees 11/27/2016   Obesity (BMI 30-39.9) 11/10/2016   Abnormal EKG 10/23/2016   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/23/2016   Morbid obesity (HCC) 10/23/2016   Palpitations 10/23/2016   Positive ANA (antinuclear antibody) 10/23/2016   Hypertension 10/08/2015   Chronic fatigue 10/08/2015   Diabetes mellitus (HCC) 10/08/2015   Glaucoma 10/08/2015   Major depressive disorder in partial remission (HCC) 10/08/2015   Vitamin D deficiency 10/08/2015  Sleep apnea 10/08/2015   History of pancreatitis 07/11/2014    PCP: Abbey Chatters, NP  REFERRING PROVIDER: Clementeen Graham, MD  REFERRING DIAG: L lumbar radiculopathy  Rationale for Evaluation and Treatment: Rehabilitation  THERAPY DIAG:  Acute left lumbar radiculopathy  Abnormal posture  Muscle weakness (generalized)  ONSET DATE: 01/05/23  SUBJECTIVE:                                                                                                                                                                                            SUBJECTIVE STATEMENT: 02/23/23 -  Still in a lot of pain L hip.  Therapy makes me feel better but relief doesn't last long.  L hip still hurts when I take a step . Feels good for a few mins after Rx sessions here then pain again.  Pain almost goes away when I sit on my sofa for a while.   Vertigo is really flared up.  Saw a new PCP today, and he is referring me to different specialists for the vertigo and L hip pain.  The tape didn't help much .   EVAL:The patient reports had spinal manipulation approximately 4 weeks ago, then developed L lateral hip pain.  Injection of her SI jt did not help   PERTINENT HISTORY:  Went to chiropractor, had adjustment that seemed to cause L lateral hip pain. Had injection L SI . Referred to PT   PAIN:  Are you having pain? Yes: NPRS scale: 5/10 Pain location: L lateral hip and thigh, groin Pain description: worse with lifting, moving L leg Aggravating factors: getting in and out of bed, walking, sit to stand Relieving factors: sitting on sofa in a certain position  PRECAUTIONS: Fall and Other: frequent vertigo  WEIGHT BEARING RESTRICTIONS: No  FALLS:  Has patient fallen in last 6 months? Yes. Number of falls 1 . L leg gave away 2 days ago  LIVING ENVIRONMENT: Lives with: lives alone Lives in: House/apartment Stairs: No Has following equipment at home: Single point cane and Environmental consultant - 2 wheeled  OCCUPATION: retired  PLOF: Independent with basic ADLs, Independent with household mobility with device, Independent with gait, and Independent with transfers  PATIENT GOALS: be able to get out of the house, walk I  NEXT MD VISIT: 02/09/23  OBJECTIVE:   DIAGNOSTIC FINDINGS:  L-spine: No acute fractures.  DDD L5-S1.   Left hip: Mild DJD left hip joint.  No acute fractures are visible.  Erosive change present lesser trochanter.  Appears similar to prior x-ray dated December 01, 2022.  PATIENT SURVEYS:  Modified Oswestry 22/50 or 66%  disability  SCREENING FOR RED FLAGS: Bowel or bladder incontinence: No Spinal tumors: No Cauda equina syndrome: No Compression fracture: No Abdominal aneurysm: No  COGNITION: Overall cognitive status: Within functional limits for tasks assessed     SENSATION: WFL  MUSCLE LENGTH: Hamstrings: Right -30 deg; Left wfl deg  POSTURE: rounded shoulders, forward head, and flexed trunk   PALPATION: Hypersensitive/tender L lateral and post hip musculature, pronounced   LUMBAR ROM:   AROM eval  Flexion 40  Extension 40  Right lateral flexion   Left lateral flexion   Right rotation   Left rotation    (Blank rows = not tested)  LOWER EXTREMITY ROM:     Passive  Right eval Left eval  Hip flexion 105 105  Hip extension    Hip abduction    Hip adduction    Hip internal rotation 15 15  Hip external rotation wfl Wfl with pain  Knee flexion    Knee extension    Ankle dorsiflexion    Ankle plantarflexion    Ankle inversion    Ankle eversion     (Blank rows = not tested)  LOWER EXTREMITY MMT:    MMT Right eval Left eval  Hip flexion  P! 3/5  Hip extension    Hip abduction  3/5  Hip adduction    Hip internal rotation    Hip external rotation    Knee flexion    Knee extension  Pain L lat hip 4/5  Ankle dorsiflexion    Ankle plantarflexion    Ankle inversion    Ankle eversion     (Blank rows = not tested)  LUMBAR SPECIAL TESTS:  Straight leg raise test: Negative and Slump test: Positive  FUNCTIONAL TESTS:  30 seconds chair stand test 10 meter walk test: 13.1sec  GAIT: Distance walked: in clinic up to 70' Assistive device utilized: Single point cane Level of assistance: Modified independence Comments: antalgic L   TODAY'S TREATMENT:                                                                                                                              DATE:  02/23/23:Therapeutic exercise:  the patient was instructed in the following exercises,  designed to recruit her LE musculature and assist with endurance ex.   Nustep level 5, 3 min , then 1 min rest, then 3 additional min, Ue's and LE's Seated long arc quads 3#, 2 x 10 reps each Seated hamstring curls, blue band, 20 reps each Seated heel raises with 2, 3# wts on thighs, 2 x 10 reps Seated leg presses into compliant surface of BOSU, 15 reps, 5 sec holds each leg Seated ball squeezes 5 sec holds B for hip adductors Seated L hip ER stretch    02/18/23: Manual: Initially assessed patient's hip ROM to rule out additional jt issue, no pain with hip IR /ER in sitting and supine.  Prone for deep cross friction massage L gluteals, piriformis,  glut med and minimus Trigger Point Dry-Needling  Treatment instructions: Expect mild to moderate muscle soreness. S/S of pneumothorax if dry needled over a lung field, and to seek immediate medical attention should they occur. Patient verbalized understanding of these instructions and education. Patient Consent Given: Yes Education handout provided: Previously provided Muscles treated: L gluteus medius, L piriformis, L glut max Electrical stimulation performed: No Parameters: N/A Treatment response/outcome: Twitch Response Elicited and Palpable Increase in Muscle Length  L sidelying with 3 pillows between knees, so that hip in slightly abducted position, for kinesiotaping, to support glut med and TFL, extending from mid lateral thigh over IT band to medial, middle and lateral gluteus medius origins on iliac crest, 3 I pieces, pull distal to proximal 25%.   Therex:  instructed in prone heel squeezes with knees extended to engage multifidus and stabilize LS spine.5 sec holds, 10 reps  Also reviewed prone props on elbows, patient performing at home as well as prone hamstring curls   02/15/23 Therapeutic Exercise: to improve strength and mobility.  Demo, verbal and tactile cues throughout for technique. Prone lying x 3 min Prone knee bends x 10  bil  Prone on elbows x 3 min Supine PPT x 10 LTR x 10 L hip flexor stretch Manual Therapy: to decrease muscle spasm and pain and improve mobility L UPA mobs to lumbar spine, STM/TPR to L QL, lumbar paraspinals, glutes/piriformis, skilled palpation and monitoring during dry needling. Trigger Point Dry-Needling  Treatment instructions: Expect mild to moderate muscle soreness. S/S of pneumothorax if dry needled over a lung field, and to seek immediate medical attention should they occur. Patient verbalized understanding of these instructions and education. Patient Consent Given: Yes Education handout provided: Previously provided Muscles treated: L glute med,  L piriformis Treatment response/outcome: Twitch Response Elicited and Palpable Increase in Muscle Length  02/10/23 Therapeutic Activity:  screening for dizziness/balance concerns.   OCULOMOTOR EXAM:  Ocular Alignment: normal  Ocular ROM: No Limitations  Spontaneous Nystagmus:  appears to have horizontal nystagmus at rest, but stops when asked to focus on object  Gaze-Induced Nystagmus: right beating with right gaze  Smooth Pursuits: intact  Saccades: intact POSITIONAL TESTING: Right Dix-Hallpike: no nystagmus Left Dix-Hallpike: no nystagmus Right Roll Test: no nystagmus Left Roll Test: no nystagmus  Balance: unable to get into or maintain tandem stance without immediate LOB.  Coordination: intact (fingers to nose with eyes closed, alternating arm and leg movements) Manual Therapy: to decrease muscle spasm and pain and improve mobility STM/TPR to L glutes/piriformis, skilled palpation and monitoring during dry needling. Trigger Point Dry-Needling  Treatment instructions: Expect mild to moderate muscle soreness. S/S of pneumothorax if dry needled over a lung field, and to seek immediate medical attention should they occur. Patient verbalized understanding of these instructions and education. Patient Consent Given: Yes Education  handout provided: Previously provided Muscles treated: L piriformis Treatment response/outcome: Twitch Response Elicited and Palpable Increase in Muscle Length   02/04/23:Manual: Trigger Point Dry-Needling  Treatment instructions: Expect mild to moderate muscle soreness. S/S of pneumothorax if dry needled over a lung field, and to seek immediate medical attention should they occur. Patient verbalized understanding of these instructions and education. Patient Consent Given: Yes Education handout provided: Previously provided Muscles treated: L gluteus medius, minimus, TFL, glut max, L vastus lateralis Electrical stimulation performed: No Parameters: N/A Treatment response/outcome: Twitch Response Elicited and Palpable Increase in Muscle Length  Massage with theragun L vastus lateralis, L TFL   Therex: Seated presses into  compliant surface Bosu, 15 x each leg  Seated long arc quads with blue theraband, 10 x 2  Seated hip abd/ ER with blue t band resistance 15 x 2 01/28/23:  Manual: Trigger Point Dry-Needling  Treatment instructions: Expect mild to moderate muscle soreness. S/S of pneumothorax if dry needled over a lung field, and to seek immediate medical attention should they occur. Patient verbalized understanding of these instructions and education. Patient Consent Given: Yes Education handout provided: Previously provided Muscles treated: L gluteus medius, minimus, TFL, 6 pts Electrical stimulation performed: No Parameters: N/A Treatment response/outcome: Twitch Response Elicited and Palpable Increase in Muscle Length   Sidelying R For deep cross friction massage L TFL, glut medius  Therex: instructed in isometrics for hips per HEP below  PATIENT EDUCATION:  Education details: educated on BE FAST stroke symptoms   Person educated: Patient Education method: Explanation Education comprehension: verbalized understanding  HOME EXERCISE PROGRAM: Access Code: [email or web address] URL:  [email or web address] Date: 01/28/2023 Prepared by: Jamarri Vuncannon  Exercises - Seated Isometric Hip Abduction with Belt  - 3 x daily - 7 x weekly - 1 sets - 5 reps - 10 sec hold - Seated Isometric Hip Adduction with Ball  - 1 x daily - 7 x weekly - 1 sets - 5 reps - 10 ec hold - Seated Gluteal Sets  - 1 x daily - 7 x weekly - 1 sets - 5 reps - 10 sec  hold  ASSESSMENT:  CLINICAL IMPRESSION: Lillyth Spong reported continued ongoing pain L lateral/ post hip, sometimes radiating into groin, still more  painful with initial contact L LE with walking.  Today we determined to utilize B LE strengthening instead of utilizing the modalities and manual pain management techniques. She did state improved post visit.  Amita Atayde continues to demonstrate potential for improvement and would benefit from continued skilled therapy to address impairments.   OBJECTIVE IMPAIRMENTS: decreased activity tolerance, decreased balance, decreased endurance, difficulty walking, decreased ROM, decreased strength, dizziness, and pain.   ACTIVITY LIMITATIONS: carrying, lifting, bending, standing, squatting, sleeping, transfers, and bed mobility  PARTICIPATION LIMITATIONS: laundry, driving, shopping, and community activity  PERSONAL FACTORS: Age, Behavior pattern, Education, Fitness, Past/current experiences, Time since onset of injury/illness/exacerbation, Transportation, and 3+ comorbidities: vertigo, acute and chronic, R cervical radiculopathy, DM  are also affecting patient's functional outcome.   REHAB POTENTIAL: Good  CLINICAL DECISION MAKING: Evolving/moderate complexity  EVALUATION COMPLEXITY: Moderate   GOALS: Goals reviewed with patient? Yes  SHORT TERM GOALS: Target date: 2 weeks, 02/11/23  I HEP  Baseline: Goal status: IN PROGRESS   LONG TERM GOALS: Target date: 8 weeks, 03/25/23  Modified oswestry score:22/50 Baseline: 44/50 Goal status: IN PROGRESS  2.  Improve L hip abduction  strength to 4+/5 for improved stability for gait  Baseline: 3/5 Goal status: IN PROGRESS  3.  Improve walking speed for 10 M walk test to 7 or less for improved community mobility Baseline: 13.1 Goal status: IN PROGRESS  4.  30 sec sit to stand, increase to 7 Baseline: 1 Goal status: IN PROGRESS  PLAN:  PT FREQUENCY: 2x/week  PT DURATION: 8 weeks  PLANNED INTERVENTIONS: Therapeutic exercises, Therapeutic activity, Neuromuscular re-education, Balance training, Gait training, Patient/Family education, Self Care, and Joint mobilization.  PLAN FOR NEXT SESSION:  reassess strength L hip, add other strengthening ex.  Need to update HEP for prone and NS exercises.    Darilyn Storbeck Frazier Richards, PT, DPT  02/23/2023, 4:48 PM

## 2023-02-23 NOTE — Patient Instructions (Addendum)
Hypertension, unspecified type Bp well controlled. Continue current bp meds.  - Comp Met (CMET)  Sciatica of left side Will follow cmp. If kidney function good will try low dose meloxicam. If you take nsaid not use with any otc nsaid. Also check bp. Not to use meloxicam if bp above 140/90. - Ambulatory referral to Sports Medicine  Pain of left hip See above(rx advisement on if you choose to try norco that you have.) - Ambulatory referral to Sports Medicine  Elevated blood sugar Low sugar diet and check a1c - Hemoglobin A1c   Vertigo Chronic with no improvement recently with epley and meclizine did hot help. Recommend you have your pcp refer you to neurologist as planned. See if [facility] referral. Looks like in past  referral was declined since you saw [facility] in the past? - CBC w/Diff to see if any anemia.  Follow up with pcp as in 2-3 week or sooner if needed.

## 2023-02-23 NOTE — Progress Notes (Signed)
Subjective:    Patient ID: Brenda Mcfarland, female    DOB: 1946/08/10, 77 y.o.   MRN: [id number]  HPI  Pt in for first time. Pt explained mid exam here for opinion on various conditions. Recommended by PT but not tranferring care.   Htn- initial bp reading. On amlodipine 5 mg daily and losartan 50 mg daily. Pt does not check bp routinely. She has bp machine but not checking.   Pt has left hip pain- she states chiropacter tx did not help. In fact she states recent pain made worse. Then went to sports med twice and now in PT. Pt has seen both Dr. Jordan Likes and Dr. Kandee Keen. Hip pain for 1.5 months. Pt constant event at rest. But states extreme on movement at times.   Pt states in past she was given oxycodone but had extreme vertigo.  Pt states has not tried norco. She is afraid to take since vertigo with oxycodone.  Pt is using a message gun and at time get relief with alleve. Will use alleve    IMPRESSION: Mild bilateral hip osteoarthritis, without acute superimposed process.  Hx of prediabetes for year.    Hx of vertigo for 1.5 years. In the past patients had Epley maneuvers. Most recent pt did not think epley maneuvers would be benificial.  Pt states she has seen both ent and neurologist.  Primary care NP referred to neurlogist.    Review of Systems  Constitutional:  Negative for chills, fatigue and fever.  HENT:  Negative for congestion, drooling and ear discharge.   Respiratory:  Negative for cough, chest tightness, shortness of breath and wheezing.   Genitourinary:  Negative for dysuria.  Musculoskeletal:  Negative for back pain and gait problem.  Skin:  Negative for rash.  Neurological:        Chronic vertigo.       Objective:   Physical Exam  General Mental Status- Alert. General Appearance- Not in acute distress.   Skin General: Color- Normal Color. Moisture- Normal Moisture.  Neck Carotid Arteries- Normal color. Moisture- Normal Moisture. No carotid bruits.  No JVD.  Chest and Lung Exam Auscultation: Breath Sounds:-Normal.  Cardiovascular Auscultation:Rythm- Regular. Murmurs & Other Heart Sounds:Auscultation of the heart reveals- No Murmurs.  Abdomen Inspection:-Inspeection Normal. Palpation/Percussion:Note:No mass. Palpation and Percussion of the abdomen reveal- Non Tender, Non Distended + BS, no rebound or guarding.    Neurologic Cranial Nerve exam:- CN III-XII intact(No nystagmus), symmetric smile.ct Strength:- 5/5 equal and symmetric strength both upper and lower extremities.   Back- no mid lumbar tenderness to palpation. Left SI tenderness to palpation. Hip some pain in rom. But on rom pain seems to more prominent in left si area.    Assessment & Plan:   Patient Instructions  Hypertension, unspecified type Bp well controlled. Continue current bp meds.  - Comp Met (CMET)  Sciatica of left side Will follow cmp. If kidney function good will try low dose meloxicam - Ambulatory referral to Sports Medicine  Pain of left hip See above - Ambulatory referral to Sports Medicine  Elevated blood sugar Low sugar diet and check a1c - Hemoglobin A1c   Vertigo Chronic with no improvement recently with epley and meclizine did hot help. Recommend you have your pcp refer you to neurologist as planned. [facility]. Looks like in past was declined since you saw [facility]ist in the past? - CBC w/Diff to see if any anemia.  Follow up with pcp as in  2-3 week or sooner if needed.   Esperanza Richters, PA-C   Time spent with patient today was 52  minutes which consisted of chart revdiew, discussing diagnosis, work up treatment and documentation.

## 2023-02-24 ENCOUNTER — Telehealth: Payer: Self-pay

## 2023-02-24 ENCOUNTER — Encounter: Payer: Self-pay | Admitting: Family Medicine

## 2023-02-24 DIAGNOSIS — I10 Essential (primary) hypertension: Secondary | ICD-10-CM

## 2023-02-24 DIAGNOSIS — M25552 Pain in left hip: Secondary | ICD-10-CM

## 2023-02-24 DIAGNOSIS — M5416 Radiculopathy, lumbar region: Secondary | ICD-10-CM

## 2023-02-24 MED ORDER — LOSARTAN POTASSIUM 50 MG PO TABS
50.0000 mg | ORAL_TABLET | Freq: Every day | ORAL | 1 refills | Status: DC
Start: 2023-02-24 — End: 2023-08-18

## 2023-02-24 NOTE — Telephone Encounter (Signed)
Pharmacy send a rx for losartan 50 mg

## 2023-02-25 ENCOUNTER — Ambulatory Visit: Payer: Medicare HMO

## 2023-02-25 ENCOUNTER — Encounter: Payer: Self-pay | Admitting: Neurology

## 2023-02-25 NOTE — Telephone Encounter (Signed)
Forwarding to Dr. Corey to review and advise.  

## 2023-02-25 NOTE — Telephone Encounter (Signed)
Patient called back following up on this message °

## 2023-02-25 NOTE — Telephone Encounter (Signed)
Patient called asking if Dr Denyse Amass would be able to go ahead and order the MRI for her and then follow up after the results are back.  Location: [facility]   She states that she is in a lot of pain and trying to get this done sooner than later.

## 2023-02-25 NOTE — Telephone Encounter (Signed)
Order placed for MRI L-spine and MRI Left Hip to [facility].

## 2023-02-26 ENCOUNTER — Ambulatory Visit: Payer: Medicare HMO | Admitting: Cardiology

## 2023-02-28 ENCOUNTER — Encounter: Payer: Self-pay | Admitting: Medical

## 2023-03-01 ENCOUNTER — Other Ambulatory Visit: Payer: Self-pay | Admitting: Family Medicine

## 2023-03-01 MED ORDER — HYDROCODONE-ACETAMINOPHEN 5-325 MG PO TABS: 1.0000 | ORAL_TABLET | Freq: Three times a day (TID) | ORAL | 0 refills | Status: DC | PRN

## 2023-03-01 NOTE — Telephone Encounter (Signed)
Refilled

## 2023-03-01 NOTE — Telephone Encounter (Signed)
Last OV 01/20/23 Next OV 03/03/23  Last refill 01/18/23 Qty #15/0  Controlled substance, forwarding to Dr. Denyse Amass

## 2023-03-01 NOTE — Telephone Encounter (Signed)
Patient called asking if Dr Denyse Amass would be able to refill HYDROcodone-acetaminophen (NORCO/VICODIN) 5-325 MG tablet?  Pharmacy: [facility]

## 2023-03-02 ENCOUNTER — Ambulatory Visit: Payer: Medicare HMO | Admitting: Family Medicine

## 2023-03-02 NOTE — Telephone Encounter (Signed)
Medication filled yesterday by Dr.Evans

## 2023-03-03 ENCOUNTER — Ambulatory Visit (INDEPENDENT_AMBULATORY_CARE_PROVIDER_SITE_OTHER): Payer: Medicare HMO | Admitting: Sports Medicine

## 2023-03-03 VITALS — BP 130/84 | HR 81 | Ht 67.0 in | Wt 243.0 lb

## 2023-03-03 DIAGNOSIS — M25552 Pain in left hip: Secondary | ICD-10-CM

## 2023-03-03 DIAGNOSIS — M5416 Radiculopathy, lumbar region: Secondary | ICD-10-CM

## 2023-03-03 MED ORDER — METHYLPREDNISOLONE 4 MG PO TBPK: ORAL_TABLET | ORAL | 0 refills | Status: DC

## 2023-03-03 MED ORDER — KETOROLAC TROMETHAMINE 60 MG/2ML IM SOLN
60.0000 mg | Freq: Once | INTRAMUSCULAR | Status: AC
Start: 2023-03-03 — End: 2023-03-03
  Administered 2023-03-03: 60 mg via INTRAMUSCULAR

## 2023-03-03 MED ORDER — METHYLPREDNISOLONE ACETATE 80 MG/ML IJ SUSP
80.0000 mg | Freq: Once | INTRAMUSCULAR | Status: AC
Start: 2023-03-03 — End: 2023-03-03
  Administered 2023-03-03: 80 mg via INTRAMUSCULAR

## 2023-03-03 NOTE — Progress Notes (Signed)
Brenda Mcfarland D.[facility] [address] [place] [address] Phone: [phone]   Assessment and Plan:     1. Left hip pain 2. Lumbar back pain with radiculopathy affecting left lower extremity   -Chronic with exacerbation, subsequent visit - Patient has continued left low back pain, left hip pain, and left lower extremity pain.  Pain has become severe and at times is 10/10, so patient presents today for further recommendation on pain relief - Patient already has left hip MRI and lumbar MRI scheduled for 03/08/2023.  I agree with these image studies, and we can review them at follow-up visit -Patient message encounter showed [facility] recently called in a short supply of hydrocodone for patient to use for severe breakthrough pain.  I would not plan on making this a long-term pain reliever, but I agree this is appropriate in the short-term.  Reviewing family medicine notes, they also are only planning on using a short supply for temporary pain relief - Patient elected for IM injection of methylprednisone 80 mg/Toradol 60 mg.  Injection given in clinic today and tolerated well. -Start prednisone Dosepak tomorrow  Patient accompanied by her daughter who provide HPI  Pertinent previous records reviewed include family medicine patient message 02/01/2023, family medicine patient message 02/01/2023, sports medicine patient message 02/24/23, medicine patient message 02/23/23, physical therapy note 02/23/23, family medicine note 02/23/23, lumbar spine x-ray 01/18/2023   Follow Up: 4 days after MRI to review results and discuss treatment plan.  Could consider OMT versus CSI based on MRI findings   Subjective:   I, Brenda Mcfarland, am serving as a Neurosurgeon for Doctor Richardean Sale  Chief Complaint: MSK   HPI:   01/20/2023 Brenda Mcfarland is a 77 y.o. female who presents to [facility] at [facility] today for low back pain. Pt was previously seen by Dr. Denyse Amass on 10/22/22  for chronic right shoulder, right trapezius, and right sided neck pain.    Today, pt c/o low back pain that was exacerbated by a chiropractic adjustment 7-10 days ago. She was just seen by Dr. Jordan Likes on the 20th w/ this complaint and was given a Depo-Medrol IM injection and was prescribed hydrocodone. Pt locates pain to right hip, which has improved. Also c/o left hip pain radiating into the glute and groin. Sx causing difficulty with ambulation. No relief with steroid injection with Dr. Jordan Likes. Notes GI upset with Hydrocodone so not currently taking. Has also been taking Gabapentin which was originally prescribed for shoulder sx. Feels better when leg is outstretched.    Pain is severe 10 out of 10 at times.  Pain is primarily located in the left low back radiating around to the groin and down the leg to the lateral calf.   Radiating pain: hip, glute, groin, leg down to the foot LE numbness/tingling: no LE weakness: no Aggravates: walking Treatments tried: Hydrocodone-APAP, IM steroid inj, Gabapentin   Dx imaging: 01/18/23 L-spine XR             12/01/22 L hip XR   03/03/23 Patient is a 77 year old female complaining of low back pain. Patient states left hip pain. 2 months of pain immediate reaction after a chiro adjustment. Pain radiates down to the leg and to the groin area. Pain with walking she has shooting pain. Is in a wheelchair and uses a cane. Has been given hydro and has had ankle swelling. No numbness and tingling. PT is not helping  Relevant Historical Information: Hypertension, OSA, DM type II, elevated BMI,  Additional pertinent review of systems negative.  Current Outpatient Medications  Medication Sig Dispense Refill   amLODipine (NORVASC) 5 MG tablet Take 1 tablet (5 mg total) by mouth every other day. 90 tablet 3   HYDROcodone-acetaminophen (NORCO/VICODIN) 5-325 MG tablet Take 1 tablet by mouth every 8 (eight) hours as needed. 15 tablet 0   Ibuprofen 200 MG CAPS Take  by mouth as needed.     latanoprost (XALATAN) 0.005 % ophthalmic solution Place 1 drop into both eyes at bedtime.     losartan (COZAAR) 50 MG tablet Take 1 tablet (50 mg total) by mouth daily. 90 tablet 1   MAGNESIUM GLYCINATE PO Take 350 mg by mouth.     melatonin 5 MG TABS Take by mouth at bedtime. 1-2 tablets     methylPREDNISolone (MEDROL DOSEPAK) 4 MG TBPK tablet Take 6 tablets on day 1.  Take 5 tablets on day 2.  Take 4 tablets on day 3.  Take 3 tablets on day 4.  Take 2 tablets on day 5.  Take 1 tablet on day 6. 21 tablet 0   OVER THE COUNTER MEDICATION daily. Essential Oils     oxyCODONE-acetaminophen (PERCOCET/ROXICET) 5-325 MG tablet Take 1 tablet by mouth every 8 (eight) hours as needed for severe pain. 15 tablet 0   predniSONE (DELTASONE) 50 MG tablet Take 1 tablet (50 mg total) by mouth daily. Take if the nerve pain worsens 5 tablet 0   No current facility-administered medications for this visit.      Objective:     Vitals:   03/03/23 1311  BP: 130/84  Pulse: 81  SpO2: 94%  Weight: 243 lb (110.2 kg)  Height: 5\' 7"  (1.702 m)      Body mass index is 38.06 kg/m.    Physical Exam:     General: Well-appearing, cooperative, sitting uncomfortably in wheelchair HEENT: Normocephalic, atraumatic.   Neck: No gross abnormality.  Cardiovascular: No pallor or cyanosis. Resp: Comfortable WOB.   Abdomen: Non distended.   Skin: Warm and dry; no focal rashes identified on limited exam. Extremities: No cyanosis.  2+ bilateral pitting edema Neuro: Gross motor and sensory intact. Gait normal. Psychiatric: Mood and affect are appropriate.   Electronically signed by:  Brenda Mcfarland D.[facility] 1:38 PM 03/03/23

## 2023-03-03 NOTE — Addendum Note (Signed)
Addended by: Evon Slack on: 03/03/2023 01:57 PM   Modules accepted: Orders

## 2023-03-03 NOTE — Patient Instructions (Addendum)
Prednisone dos pak  Follow up next Friday

## 2023-03-06 ENCOUNTER — Other Ambulatory Visit: Payer: Medicare HMO

## 2023-03-08 ENCOUNTER — Ambulatory Visit: Payer: Medicare HMO | Attending: Family Medicine | Admitting: Physical Therapy

## 2023-03-08 ENCOUNTER — Encounter: Payer: Self-pay | Admitting: Sports Medicine

## 2023-03-08 ENCOUNTER — Encounter: Payer: Self-pay | Admitting: Physical Therapy

## 2023-03-08 ENCOUNTER — Ambulatory Visit: Payer: Medicare HMO

## 2023-03-08 DIAGNOSIS — M5416 Radiculopathy, lumbar region: Secondary | ICD-10-CM | POA: Insufficient documentation

## 2023-03-08 DIAGNOSIS — M6281 Muscle weakness (generalized): Secondary | ICD-10-CM | POA: Diagnosis not present

## 2023-03-08 DIAGNOSIS — R293 Abnormal posture: Secondary | ICD-10-CM | POA: Insufficient documentation

## 2023-03-08 NOTE — Progress Notes (Deleted)
Initial neurology clinic note  Brenda Mcfarland MRN: [id number] DOB: 1946/01/25  Referring provider: [name], NP  Primary care provider: [name], NP  Reason for consult:  Dizziness  Subjective:  This is [name], a 77 y.o. ***-handed female with a medical history of cervical spondylosis s/p C3-C6 surgery, HTN, HLD, DM, OSA, depression, chronic fatigue, OA, vit D deficiency*** who presents to neurology clinic with dizziness***. The patient is accompanied by ***.  *** Chronic vertigo - like on a boat, rocking Not able to think well as a result Tried meclizine, made her dizzy  Patient was referred to vestibular rehab by PCP on 03/26/22.*** Saw ENT? Had Epley in the past, did not find helpful  On opioids?*** On gabapentin for shoulder pain***  Patient was seen at [facility] by Dr. Rosalyn Gess on 11/27/20. Per that clinic note: [name] is a 77 y.o. female referred for confusion and clumsiness. Majority of her symptoms started after she did had Covid. Patient stated that she had Covid twice. She stated since then she has had confusion involving difficulty with memory she also endorses difficulty holding onto objects and shakiness. Some anxiety. Patient also states that she has vertigo however has had vertigo for years even before Covid. One of the concerns was could this be iatrogenic she was on temazepam she has stopped then since then her memory and thought processing per patient and patient's daughter has improved.Marland Kitchen     MEDICATIONS:  Outpatient Encounter Medications as of 03/17/2023  Medication Sig   amLODipine (NORVASC) 5 MG tablet Take 1 tablet (5 mg total) by mouth every other day.   HYDROcodone-acetaminophen (NORCO/VICODIN) 5-325 MG tablet Take 1 tablet by mouth every 8 (eight) hours as needed.   Ibuprofen 200 MG CAPS Take by mouth as needed.   latanoprost (XALATAN) 0.005 % ophthalmic solution Place 1 drop into both eyes at  bedtime.   losartan (COZAAR) 50 MG tablet Take 1 tablet (50 mg total) by mouth daily.   MAGNESIUM GLYCINATE PO Take 350 mg by mouth.   melatonin 5 MG TABS Take by mouth at bedtime. 1-2 tablets   methylPREDNISolone (MEDROL DOSEPAK) 4 MG TBPK tablet Take 6 tablets on day 1.  Take 5 tablets on day 2.  Take 4 tablets on day 3.  Take 3 tablets on day 4.  Take 2 tablets on day 5.  Take 1 tablet on day 6.   OVER THE COUNTER MEDICATION daily. Essential Oils   oxyCODONE-acetaminophen (PERCOCET/ROXICET) 5-325 MG tablet Take 1 tablet by mouth every 8 (eight) hours as needed for severe pain.   predniSONE (DELTASONE) 50 MG tablet Take 1 tablet (50 mg total) by mouth daily. Take if the nerve pain worsens   No facility-administered encounter medications on file as of 03/17/2023.    PAST MEDICAL HISTORY: Past Medical History:  Diagnosis Date   Abnormal EKG 10/23/2016   Arthritis 01/18/2020   Chronic fatigue 10/08/2015   Depression 01/18/2020   Diabetes mellitus (HCC) 10/08/2015   Formatting of this note might be different from the original. Overview:  last Hgb A1C 7.2/no blood sugars at home Formatting of this note might be different from the original. last Hgb A1C 7.2/no blood sugars at home   ESR raised 11/27/2016   Frequent urination 12/16/2018   Glaucoma 10/08/2015   High cholesterol    History of pancreatitis 07/11/2014   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/23/2016   Hypertension 10/08/2015   IFG (impaired fasting glucose) 10/24/2019  Incomplete emptying of bladder 11/30/2017   Knee pain, left 12/22/2016   Loss of memory 01/18/2020   Major depressive disorder in partial remission (HCC) 10/08/2015   Morbid obesity (HCC) 10/23/2016   Obesity (BMI 30-39.9) 11/10/2016   Obstructive sleep apnea 12/16/2018   Osteoarthritis of both knees 11/27/2016   Other insomnia 03/05/2017   Palpitations 10/23/2016   Positive ANA (antinuclear antibody) 10/23/2016   Sleep apnea 10/08/2015   Formatting of this note might be different from the original.  Overview:  does not use CPAP Formatting of this note might be different from the original. does not use CPAP   Sore neck 10/24/2019   Spondylosis of cervical spine with myelopathy 01/18/2020   Type 2 diabetes mellitus without complication, without long-term current use of insulin (HCC) 12/16/2018   Vitamin D deficiency 10/08/2015    PAST SURGICAL HISTORY: Past Surgical History:  Procedure Laterality Date   APPENDECTOMY     age 72   CESAREAN SECTION     X 3   NECK SURGERY  2015   SPINE SURGERY  05/29/2015    ALLERGIES: Allergies  Allergen Reactions   Amlodipine Swelling   Aripiprazole Other (See Comments)    Tongue twisted, lips curled up Tongue twisted, lips curled up    Carvedilol Other (See Comments)    Nightmares Nightmares    Hydralazine     Diarrhea, abdominal pain   Irbesartan     dizziness   Meclizine Other (See Comments)    Dizziness    Paroxetine Hcl Other (See Comments)    . Marland Kitchen    Tizanidine Other (See Comments)    Syncope Syncope    Tramadol Other (See Comments)    Unknown   Zolpidem Other (See Comments)    Sleepwalking Sleepwalking    Zyrtec [Cetirizine Hcl]     Dizziness    FAMILY HISTORY: Family History  Problem Relation Age of Onset   Diabetes Mother    Multiple sclerosis Mother    Heart disease Father    Heart Problems Father    Migraines Daughter    Thyroid disease Daughter    Migraines Daughter     SOCIAL HISTORY: Social History   Tobacco Use   Smoking status: Former    Packs/day: 1.00    Years: 9.00    Additional pack years: 0.00    Total pack years: 9.00    Types: Cigarettes    Start date: 1964    Quit date: 1973    Years since quitting: 51.5   Smokeless tobacco: Never  Vaping Use   Vaping Use: Never used  Substance Use Topics   Alcohol use: Not Currently   Drug use: Not Currently   Social History   Social History Narrative   Tobacco use, amount per day now: No   Past tobacco use, amount per day: 10 years. , 1  pack daily.   How many years did you use tobacco: 10 years   Alcohol use (drinks per week): None.   Diet: Simple- Eggs, Sausage, Toast, Chicken/Beef Salad or Vegetables.    Do you drink/eat things with caffeine: No   Marital status:   Divorced                               What year were you married? 1968   Do you live in a house, apartment, assisted living, condo, trailer, etc.? House.   Is it one or more  stories? One   How many persons live in your home? Self.   Do you have pets in your home?( please list) 1 [name]    Highest Level of education completed? Undergrad.    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   Do you exercise? No                                 Type and how often?   Do you have a living will? No   Do you have a DNR form? Yes                                  If not, do you want to discuss one? No   Do you have signed POA/HPOA forms? Yes                       If so, please bring to you appointment      Do you have any difficulty bathing or dressing yourself? Yes   Do you have any difficulty preparing food or eating? Yes   Do you have any difficulty managing your medications? No   Do you have any difficulty managing your finances? No   Do you have any difficulty affording your medications? No    Objective:  Vital Signs:  There were no vitals taken for this visit.  ***  Labs and Imaging review: Internal labs: Lab Results  Component Value Date   HGBA1C 7.0 (H) 02/23/2023   Lab Results  Component Value Date   VITAMINB12 560 11/18/2020   Lab Results  Component Value Date   TSH 2.82 05/01/2022   No results found for: "ESRSEDRATE", "POCTSEDRATE"  02/23/23: CBC w/ diff unremarkable CMP significant for mildly elevated glucose (123)  Vit D (05/01/22): wnl  External labs: ***  Imaging: MRI lumbar spine wo contrast (03/08/23): ***  Cervical spine xray (10/22/22): FINDINGS: The cervical spine is only well visualized to the bottom  of C6 on lateral imaging. Anterior plate and screw fusion from C3 through C6 remains. Hardware is in good position. No malalignment. Pre odontoid space and prevertebral soft tissues are normal. Mild degenerative disc disease at C2-3. Uncovertebral degenerative changes identified, right greater than left. No other bony or soft tissue abnormalities are noted.   IMPRESSION: 1. Anterior plate and screw fusion from C3 through C6 remains. Hardware is in good position. 2. Degenerative changes as above.  CT head wo contrast (05/14/22): FINDINGS: Brain: No intracranial hemorrhage, mass effect, or evidence of acute infarct. No hydrocephalus. No extra-axial fluid collection. Age commensurate generalized cerebral atrophy and chronic small vessel ischemic disease.   Vascular: No hyperdense vessel. Intracranial arterial calcification.   Skull: No fracture or focal lesion.   Sinuses/Orbits: No acute finding. Paranasal sinuses and mastoid air cells are well aerated.   Other: None.   IMPRESSION: No acute intracranial abnormality. ***  Assessment/Plan:  Jakila Wenzell is a 77 y.o. female who presents for evaluation of ***. *** has a relevant medical history of ***. *** neurological examination is pertinent for ***. Available diagnostic data is significant for ***. This constellation of symptoms and objective data would most likely localize to ***. ***  PLAN: -Blood work: *** ***  -Return to clinic ***  The impression above as well as the plan as outlined below  were extensively discussed with the patient (in the company of ***) who voiced understanding. All questions were answered to their satisfaction.  The patient was counseled on pertinent fall precautions per the printed material provided today, and as noted under the "Patient Instructions" section below.***  When available, results of the above investigations and possible further recommendations will be communicated to the patient via  telephone/MyChart. Patient to call office if not contacted after expected testing turnaround time.   Total time spent reviewing records, interview, history/exam, documentation, and coordination of care on day of encounter:  *** min   Thank you for allowing me to participate in patient's care.  If I can answer any additional questions, I would be pleased to do so.  Brenda Balint, MD   CC: Brenda Contes Janene Harvey, NP [address] [place] [address]  CC: Referring provider: [name], NP [address] [place],  [place] [address]

## 2023-03-08 NOTE — Therapy (Signed)
OUTPATIENT PHYSICAL THERAPY TREATMENT   Patient Name: Brenda Mcfarland MRN: [id number] DOB:12-20-1945, 77 y.o., female Today's Date: 03/08/2023  END OF SESSION:  PT End of Session - 03/08/23 1005     Visit Number 7    Date for PT Re-Evaluation 03/25/23    Authorization Type [employer] MCR + State BCBS    Progress Note Due on Visit 10    PT Start Time 1010    PT Stop Time 1100    PT Time Calculation (min) 50 min    Activity Tolerance Patient tolerated treatment well    Behavior During Therapy WFL for tasks assessed/performed               Past Medical History:  Diagnosis Date   Abnormal EKG 10/23/2016   Arthritis 01/18/2020   Chronic fatigue 10/08/2015   Depression 01/18/2020   Diabetes mellitus (HCC) 10/08/2015   Formatting of this note might be different from the original. Overview:  last Hgb A1C 7.2/no blood sugars at home Formatting of this note might be different from the original. last Hgb A1C 7.2/no blood sugars at home   ESR raised 11/27/2016   Frequent urination 12/16/2018   Glaucoma 10/08/2015   High cholesterol    History of pancreatitis 07/11/2014   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/23/2016   Hypertension 10/08/2015   IFG (impaired fasting glucose) 10/24/2019   Incomplete emptying of bladder 11/30/2017   Knee pain, left 12/22/2016   Loss of memory 01/18/2020   Major depressive disorder in partial remission (HCC) 10/08/2015   Morbid obesity (HCC) 10/23/2016   Obesity (BMI 30-39.9) 11/10/2016   Obstructive sleep apnea 12/16/2018   Osteoarthritis of both knees 11/27/2016   Other insomnia 03/05/2017   Palpitations 10/23/2016   Positive ANA (antinuclear antibody) 10/23/2016   Sleep apnea 10/08/2015   Formatting of this note might be different from the original. Overview:  does not use CPAP Formatting of this note might be different from the original. does not use CPAP   Sore neck 10/24/2019   Spondylosis of cervical spine with myelopathy 01/18/2020   Type 2 diabetes mellitus without complication, without  long-term current use of insulin (HCC) 12/16/2018   Vitamin D deficiency 10/08/2015   Past Surgical History:  Procedure Laterality Date   APPENDECTOMY     age 15   CESAREAN SECTION     X 3   NECK SURGERY  2015   SPINE SURGERY  05/29/2015   Patient Active Problem List   Diagnosis Date Noted   Strain of lumbar region 01/18/2023   Primary osteoarthritis of right shoulder 12/01/2022   OA (osteoarthritis) of hip 12/01/2022   Nocturia 05/15/2022   Paroxysmal SVT (supraventricular tachycardia) 01/22/2020   Arthritis 01/18/2020   Depression 01/18/2020   Loss of memory 01/18/2020   Spondylosis of cervical spine with myelopathy 01/18/2020   IFG (impaired fasting glucose) 10/24/2019   Frequent urination 12/16/2018   Obstructive sleep apnea 12/16/2018   Type 2 diabetes mellitus without complication, without long-term current use of insulin (HCC) 12/16/2018   Incomplete emptying of bladder 11/30/2017   Insomnia 03/05/2017   ESR raised 11/27/2016   Osteoarthritis of both knees 11/27/2016   Obesity (BMI 30-39.9) 11/10/2016   Abnormal EKG 10/23/2016   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/23/2016   Morbid obesity (HCC) 10/23/2016   Palpitations 10/23/2016   Positive ANA (antinuclear antibody) 10/23/2016   Hypertension 10/08/2015   Chronic fatigue 10/08/2015   Diabetes mellitus (HCC) 10/08/2015   Glaucoma 10/08/2015   Major depressive disorder in partial remission (  HCC) 10/08/2015   Vitamin D deficiency 10/08/2015   Sleep apnea 10/08/2015   History of pancreatitis 07/11/2014    PCP: Abbey Chatters, NP  REFERRING PROVIDER: Clementeen Graham, MD  REFERRING DIAG: L lumbar radiculopathy  Rationale for Evaluation and Treatment: Rehabilitation  THERAPY DIAG:  Acute left lumbar radiculopathy  Abnormal posture  Muscle weakness (generalized)  ONSET DATE: 01/05/23  SUBJECTIVE:                                                                                                                                                                                            SUBJECTIVE STATEMENT:  03/08/2023 pain has been very bad since last session, can barely walk now.  Saw Dr. Jean Rosenthal, he has ordered MRI, getting that this afternoon.  Living on hydrocodone.   Tried to do exercises but nothing working.   Massage gun works the best.    EVAL:The patient reports had spinal manipulation approximately 4 weeks ago, then developed L lateral hip pain.  Injection of her SI jt did not help   PERTINENT HISTORY:  Went to chiropractor, had adjustment that seemed to cause L lateral hip pain. Had injection L SI . Referred to PT   PAIN:  Are you having pain? Yes: NPRS scale: 15/10 Pain location: L lateral hip and thigh, groin Pain description: worse with lifting, moving L leg Aggravating factors: getting in and out of bed, walking, sit to stand Relieving factors: sitting on sofa in a certain position  PRECAUTIONS: Fall and Other: frequent vertigo  WEIGHT BEARING RESTRICTIONS: No  FALLS:  Has patient fallen in last 6 months? Yes. Number of falls 1 . L leg gave away 2 days ago  LIVING ENVIRONMENT: Lives with: lives alone Lives in: House/apartment Stairs: No Has following equipment at home: Single point cane and Environmental consultant - 2 wheeled  OCCUPATION: retired  PLOF: Independent with basic ADLs, Independent with household mobility with device, Independent with gait, and Independent with transfers  PATIENT GOALS: be able to get out of the house, walk I  NEXT MD VISIT: 02/09/23  OBJECTIVE:   DIAGNOSTIC FINDINGS:  L-spine: No acute fractures.  DDD L5-S1.   Left hip: Mild DJD left hip joint.  No acute fractures are visible.  Erosive change present lesser trochanter.  Appears similar to prior x-ray dated December 01, 2022.  PATIENT SURVEYS:  Modified Oswestry 22/50 or 66% disability   SCREENING FOR RED FLAGS: Bowel or bladder incontinence: No Spinal tumors: No Cauda equina syndrome: No Compression fracture:  No Abdominal aneurysm: No  COGNITION: Overall cognitive status: Within functional limits for tasks assessed  SENSATION: WFL  MUSCLE LENGTH: Hamstrings: Right -30 deg; Left wfl deg  POSTURE: rounded shoulders, forward head, and flexed trunk   PALPATION: Hypersensitive/tender L lateral and post hip musculature, pronounced   LUMBAR ROM:   AROM eval  Flexion 40  Extension 40  Right lateral flexion   Left lateral flexion   Right rotation   Left rotation    (Blank rows = not tested)  LOWER EXTREMITY ROM:     Passive  Right eval Left eval  Hip flexion 105 105  Hip extension    Hip abduction    Hip adduction    Hip internal rotation 15 15  Hip external rotation wfl Wfl with pain  Knee flexion    Knee extension     (Blank rows = not tested)  LOWER EXTREMITY MMT:    MMT Right eval Left eval  Hip flexion  P! 3/5  Hip extension    Hip abduction  3/5  Hip adduction    Hip internal rotation    Hip external rotation    Knee flexion    Knee extension  Pain L lat hip 4/5   (Blank rows = not tested)  LUMBAR SPECIAL TESTS:  Straight leg raise test: Negative and Slump test: Positive  FUNCTIONAL TESTS:  30 seconds chair stand test 10 meter walk test: 13.1sec  GAIT: Distance walked: in clinic up to 13' Assistive device utilized: Single point cane Level of assistance: Modified independence Comments: antalgic L   TODAY'S TREATMENT:                                                                                                                              DATE:   03/08/2023 Manual Therapy: to decrease muscle spasm and pain and improve mobility IASTM with foam roller to L glutes and lumbar paraspinals, L UPA mobs grade 1-2, IASTM with percussive device to L piriformis and glutes.  skilled palpation and monitoring during dry needling. Trigger Point Dry-Needling  Treatment instructions: Expect mild to moderate muscle soreness. S/S of pneumothorax if dry needled over  a lung field, and to seek immediate medical attention should they occur. Patient verbalized understanding of these instructions and education. Patient Consent Given: Yes Education handout provided: Previously provided Muscles treated: L piriformis Electrical stimulation performed: No Parameters: N/A Treatment response/outcome: Twitch Response Elicited and Palpable Increase in Muscle Length     02/23/23:Therapeutic exercise:  the patient was instructed in the following exercises, designed to recruit her LE musculature and assist with endurance ex.   Nustep level 5, 3 min , then 1 min rest, then 3 additional min, Ue's and LE's Seated long arc quads 3#, 2 x 10 reps each Seated hamstring curls, blue band, 20 reps each Seated heel raises with 2, 3# wts on thighs, 2 x 10 reps Seated leg presses into compliant surface of BOSU, 15 reps, 5 sec holds each leg Seated ball squeezes 5 sec holds B for hip adductors  Seated L hip ER stretch    02/18/23: Manual: Initially assessed patient's hip ROM to rule out additional jt issue, no pain with hip IR /ER in sitting and supine.  Prone for deep cross friction massage L gluteals, piriformis, glut med and minimus Trigger Point Dry-Needling  Treatment instructions: Expect mild to moderate muscle soreness. S/S of pneumothorax if dry needled over a lung field, and to seek immediate medical attention should they occur. Patient verbalized understanding of these instructions and education. Patient Consent Given: Yes Education handout provided: Previously provided Muscles treated: L gluteus medius, L piriformis, L glut max Electrical stimulation performed: No Parameters: N/A Treatment response/outcome: Twitch Response Elicited and Palpable Increase in Muscle Length  L sidelying with 3 pillows between knees, so that hip in slightly abducted position, for kinesiotaping, to support glut med and TFL, extending from mid lateral thigh over IT band to medial, middle  and lateral gluteus medius origins on iliac crest, 3 I pieces, pull distal to proximal 25%.   Therex:  instructed in prone heel squeezes with knees extended to engage multifidus and stabilize LS spine.5 sec holds, 10 reps  Also reviewed prone props on elbows, patient performing at home as well as prone hamstring curls   02/15/23 Therapeutic Exercise: to improve strength and mobility.  Demo, verbal and tactile cues throughout for technique. Prone lying x 3 min Prone knee bends x 10 bil  Prone on elbows x 3 min Supine PPT x 10 LTR x 10 L hip flexor stretch Manual Therapy: to decrease muscle spasm and pain and improve mobility L UPA mobs to lumbar spine, STM/TPR to L QL, lumbar paraspinals, glutes/piriformis, skilled palpation and monitoring during dry needling. Trigger Point Dry-Needling  Treatment instructions: Expect mild to moderate muscle soreness. S/S of pneumothorax if dry needled over a lung field, and to seek immediate medical attention should they occur. Patient verbalized understanding of these instructions and education. Patient Consent Given: Yes Education handout provided: Previously provided Muscles treated: L glute med,  L piriformis Treatment response/outcome: Twitch Response Elicited and Palpable Increase in Muscle Length   PATIENT EDUCATION:  Education details: discussed medication side effects, can try taking hydrocodone at night to help sleep and lessen dizziness, increase fiber to decrease constipation.     Person educated: Patient Education method: Explanation Education comprehension: verbalized understanding  HOME EXERCISE PROGRAM: Access Code: [facility] URL: [email or web address] Date: 01/28/2023 Prepared by: Amy Speaks  Exercises - Seated Isometric Hip Abduction with Belt  - 3 x daily - 7 x weekly - 1 sets - 5 reps - 10 sec hold - Seated Isometric Hip Adduction with Ball  - 1 x daily - 7 x weekly - 1 sets - 5 reps - 10 ec hold - Seated Gluteal  Sets  - 1 x daily - 7 x weekly - 1 sets - 5 reps - 10 sec  hold  ASSESSMENT:  CLINICAL IMPRESSION: Brenda Mcfarland arrived in w/c today, accompanied by her daughter, reporting that her LLE pain has worsened significantly, scheduled for MRI later this afternoon.   She is now on hydrocodone.  Focus of today's skilled intervention was on decreasing pain to make her more comfortable, she reported decreased pain at end of session.  Brenda Mcfarland continues to demonstrate potential for improvement and would benefit from continued skilled therapy to address impairments.    OBJECTIVE IMPAIRMENTS: decreased activity tolerance, decreased balance, decreased endurance, difficulty walking, decreased ROM, decreased strength, dizziness, and pain.   ACTIVITY LIMITATIONS: carrying, lifting, bending, standing,  squatting, sleeping, transfers, and bed mobility  PARTICIPATION LIMITATIONS: laundry, driving, shopping, and community activity  PERSONAL FACTORS: Age, Behavior pattern, Education, Fitness, Past/current experiences, Time since onset of injury/illness/exacerbation, Transportation, and 3+ comorbidities: vertigo, acute and chronic, R cervical radiculopathy, DM  are also affecting patient's functional outcome.   REHAB POTENTIAL: Good  CLINICAL DECISION MAKING: Evolving/moderate complexity  EVALUATION COMPLEXITY: Moderate   GOALS: Goals reviewed with patient? Yes  SHORT TERM GOALS: Target date: 2 weeks, 02/11/23  I HEP  Baseline: Goal status: IN PROGRESS   LONG TERM GOALS: Target date: 8 weeks, 03/25/23  Modified oswestry score:22/50 Baseline: 44/50 Goal status: IN PROGRESS  2.  Improve L hip abduction strength to 4+/5 for improved stability for gait  Baseline: 3/5 Goal status: IN PROGRESS  3.  Improve walking speed for 10 M walk test to 7 or less for improved community mobility Baseline: 13.1 Goal status: IN PROGRESS  4.  30 sec sit to stand, increase to 7 Baseline: 1 Goal status: IN  PROGRESS  PLAN:  PT FREQUENCY: 2x/week  PT DURATION: 8 weeks  PLANNED INTERVENTIONS: Therapeutic exercises, Therapeutic activity, Neuromuscular re-education, Balance training, Gait training, Patient/Family education, Self Care, and Joint mobilization.  PLAN FOR NEXT SESSION:  reassess strength L hip, add other strengthening ex.  Need to update HEP for prone and NS exercises.    Jena Gauss, PT, DPT  03/08/2023, 12:30 PM

## 2023-03-09 ENCOUNTER — Telehealth: Payer: Self-pay

## 2023-03-09 ENCOUNTER — Telehealth: Payer: Self-pay | Admitting: Family Medicine

## 2023-03-09 DIAGNOSIS — M5416 Radiculopathy, lumbar region: Secondary | ICD-10-CM

## 2023-03-09 MED ORDER — HYDROCODONE-ACETAMINOPHEN 10-325 MG PO TABS: 1.0000 | ORAL_TABLET | Freq: Three times a day (TID) | ORAL | 0 refills | Status: DC | PRN

## 2023-03-09 NOTE — Telephone Encounter (Signed)
Received contact from [facility] MRI.  She did not tolerate MRI lumbar spine the hip at the same time.  She had too much pain.  After conversation with Lanaiya she is upset with [facility] because they did not pull her out of the scanner sooner.  Will prescribe stronger doses of hydrocodone in order just MRI lumbar spine first at [facility] imaging.  If she cannot wait we can always try a CT myelogram.

## 2023-03-09 NOTE — Telephone Encounter (Signed)
Patient called stating that she got her message from Dr. Jean Rosenthal but would like to talk about what the difference is between the MRI machine and the CT scan to where it would be better for her positioning for all the pain she is in? Informed patient that I was not sure but she could call the location of where she was getting her MRI done and they would have way more detail on that. However patient states she is not even sure she wants to go back there because of the way she was treated. Patient did not go in to detail.   Patient wants to get a refill on her hydrocodone until she can get in for this test so has something for the pain.   She would like a phone call to discuss all this.

## 2023-03-10 ENCOUNTER — Ambulatory Visit: Payer: Medicare HMO | Admitting: Physical Therapy

## 2023-03-11 NOTE — Progress Notes (Deleted)
Brenda Mcfarland D.[facility] [address] [place] [address] Phone: [phone]   Assessment and Plan:     There are no diagnoses linked to this encounter.  ***   Pertinent previous records reviewed include ***   Follow Up: ***     Subjective:   I,  , am serving as a Neurosurgeon for Doctor Richardean Sale  Chief Complaint: MSK    HPI:    01/20/2023 Brenda Mcfarland is a 76 y.o. female who presents to [facility] at [facility] today for low back pain. Pt was previously seen by Dr. Denyse Amass on 10/22/22 for chronic right shoulder, right trapezius, and right sided neck pain.    Today, pt c/o low back pain that was exacerbated by a chiropractic adjustment 7-10 days ago. She was just seen by Dr. Jordan Likes on the 20th w/ this complaint and was given a Depo-Medrol IM injection and was prescribed hydrocodone. Pt locates pain to right hip, which has improved. Also c/o left hip pain radiating into the glute and groin. Sx causing difficulty with ambulation. No relief with steroid injection with Dr. Jordan Likes. Notes GI upset with Hydrocodone so not currently taking. Has also been taking Gabapentin which was originally prescribed for shoulder sx. Feels better when leg is outstretched.    Pain is severe 10 out of 10 at times.  Pain is primarily located in the left low back radiating around to the groin and down the leg to the lateral calf.   Radiating pain: hip, glute, groin, leg down to the foot LE numbness/tingling: no LE weakness: no Aggravates: walking Treatments tried: Hydrocodone-APAP, IM steroid inj, Gabapentin   Dx imaging: 01/18/23 L-spine XR             12/01/22 L hip XR     03/03/23 Patient is a 77 year old female complaining of low back pain. Patient states left hip pain. 2 months of pain immediate reaction after a chiro adjustment. Pain radiates down to the leg and to the groin area. Pain with walking she has shooting pain. Is  in a wheelchair and uses a cane. Has been given hydro and has had ankle swelling. No numbness and tingling. PT is not helping     03/12/2023 Patient states   Relevant Historical Information: Hypertension, OSA, DM type II, elevated BMI, Additional pertinent review of systems negative.   Current Outpatient Medications:    amLODipine (NORVASC) 5 MG tablet, Take 1 tablet (5 mg total) by mouth every other day., Disp: 90 tablet, Rfl: 3   HYDROcodone-acetaminophen (NORCO) 10-325 MG tablet, Take 1 tablet by mouth every 8 (eight) hours as needed., Disp: 30 tablet, Rfl: 0   Ibuprofen 200 MG CAPS, Take by mouth as needed., Disp: , Rfl:    latanoprost (XALATAN) 0.005 % ophthalmic solution, Place 1 drop into both eyes at bedtime., Disp: , Rfl:    losartan (COZAAR) 50 MG tablet, Take 1 tablet (50 mg total) by mouth daily., Disp: 90 tablet, Rfl: 1   MAGNESIUM GLYCINATE PO, Take 350 mg by mouth., Disp: , Rfl:    melatonin 5 MG TABS, Take by mouth at bedtime. 1-2 tablets, Disp: , Rfl:    methylPREDNISolone (MEDROL DOSEPAK) 4 MG TBPK tablet, Take 6 tablets on day 1.  Take 5 tablets on day 2.  Take 4 tablets on day 3.  Take 3 tablets on day 4.  Take 2 tablets on day 5.  Take 1 tablet on day 6.,  Disp: 21 tablet, Rfl: 0   OVER THE COUNTER MEDICATION, daily. Essential Oils, Disp: , Rfl:    oxyCODONE-acetaminophen (PERCOCET/ROXICET) 5-325 MG tablet, Take 1 tablet by mouth every 8 (eight) hours as needed for severe pain., Disp: 15 tablet, Rfl: 0   predniSONE (DELTASONE) 50 MG tablet, Take 1 tablet (50 mg total) by mouth daily. Take if the nerve pain worsens, Disp: 5 tablet, Rfl: 0   Objective:     There were no vitals filed for this visit.    There is no height or weight on file to calculate BMI.    Physical Exam:    ***   Electronically signed by:  Brenda Mcfarland D.[facility] 7:19 AM 03/11/23

## 2023-03-12 ENCOUNTER — Ambulatory Visit: Payer: Medicare HMO | Admitting: Sports Medicine

## 2023-03-15 DIAGNOSIS — G4733 Obstructive sleep apnea (adult) (pediatric): Secondary | ICD-10-CM | POA: Diagnosis not present

## 2023-03-16 ENCOUNTER — Encounter: Payer: Medicare HMO | Admitting: Physical Therapy

## 2023-03-17 ENCOUNTER — Encounter: Payer: Self-pay | Admitting: Family Medicine

## 2023-03-17 ENCOUNTER — Ambulatory Visit: Payer: Medicare HMO | Admitting: Neurology

## 2023-03-18 ENCOUNTER — Telehealth: Payer: Self-pay | Admitting: Family Medicine

## 2023-03-18 DIAGNOSIS — M5416 Radiculopathy, lumbar region: Secondary | ICD-10-CM

## 2023-03-18 NOTE — Telephone Encounter (Signed)
I called Brenda Mcfarland and we talked about the MRI.  Plan to switch it to a CT meylogram.  She cannot tolerate a MRI without full anesthesia.

## 2023-03-18 NOTE — Telephone Encounter (Signed)
Pt daughter, Brenda Mcfarland called. Has many concerns about the MRI coming up, as pt was unable to complete this before due to her pain.  Pt was instructed to take her pain meds 30 minutes before the MRI, but pt and daughter are concerned this will not be enough to get her through as she is barely able to function currently and unable to perform daily activities on her own.  Daughter seemed to think this MRI was going to be done under anesthesia at the hospital, but it appears to be at [facility].   Unsure if additional medications would help or if MRI needs to be done in the hospital due to pt pain. They very much want to get this done but are worried she will be unable.

## 2023-03-21 ENCOUNTER — Other Ambulatory Visit: Payer: Medicare HMO

## 2023-03-22 ENCOUNTER — Encounter: Payer: Medicare HMO | Admitting: Physical Therapy

## 2023-03-22 ENCOUNTER — Encounter: Payer: Self-pay | Admitting: Family Medicine

## 2023-03-22 MED ORDER — HYDROCODONE-ACETAMINOPHEN 10-325 MG PO TABS: 1.0000 | ORAL_TABLET | Freq: Three times a day (TID) | ORAL | 0 refills | Status: DC | PRN

## 2023-03-24 NOTE — Progress Notes (Unsigned)
Brenda Mcfarland D.[facility] [address] [place] [address] Phone: [phone]   Assessment and Plan:     There are no diagnoses linked to this encounter.  ***   Pertinent previous records reviewed include ***   Follow Up: ***     Subjective:   I, Brenda Mcfarland, am serving as a Neurosurgeon for Doctor Richardean Sale  Chief Complaint: MSK    HPI:    01/20/2023 Brenda Mcfarland is a 77 y.o. female who presents to [facility] at [facility] today for low back pain. Pt was previously seen by Dr. Denyse Amass on 10/22/22 for chronic right shoulder, right trapezius, and right sided neck pain.    Today, pt c/o low back pain that was exacerbated by a chiropractic adjustment 7-10 days ago. She was just seen by Dr. Jordan Likes on the 20th w/ this complaint and was given a Depo-Medrol IM injection and was prescribed hydrocodone. Pt locates pain to right hip, which has improved. Also c/o left hip pain radiating into the glute and groin. Sx causing difficulty with ambulation. No relief with steroid injection with Dr. Jordan Likes. Notes GI upset with Hydrocodone so not currently taking. Has also been taking Gabapentin which was originally prescribed for shoulder sx. Feels better when leg is outstretched.    Pain is severe 10 out of 10 at times.  Pain is primarily located in the left low back radiating around to the groin and down the leg to the lateral calf.   Radiating pain: hip, glute, groin, leg down to the foot LE numbness/tingling: no LE weakness: no Aggravates: walking Treatments tried: Hydrocodone-APAP, IM steroid inj, Gabapentin   Dx imaging: 01/18/23 L-spine XR             12/01/22 L hip XR     03/03/23 Patient is a 77 year old female complaining of low back pain. Patient states left hip pain. 2 months of pain immediate reaction after a chiro adjustment. Pain radiates down to the leg and to the groin area. Pain with walking she has shooting pain. Is  in a wheelchair and uses a cane. Has been given hydro and has had ankle swelling. No numbness and tingling. PT is not helping     03/25/2023 Patient states    Relevant Historical Information: Hypertension, OSA, DM type II, elevated BMI,  Additional pertinent review of systems negative.   Current Outpatient Medications:    amLODipine (NORVASC) 5 MG tablet, Take 1 tablet (5 mg total) by mouth every other day., Disp: 90 tablet, Rfl: 3   HYDROcodone-acetaminophen (NORCO) 10-325 MG tablet, Take 1 tablet by mouth every 8 (eight) hours as needed., Disp: 30 tablet, Rfl: 0   Ibuprofen 200 MG CAPS, Take by mouth as needed., Disp: , Rfl:    latanoprost (XALATAN) 0.005 % ophthalmic solution, Place 1 drop into both eyes at bedtime., Disp: , Rfl:    losartan (COZAAR) 50 MG tablet, Take 1 tablet (50 mg total) by mouth daily., Disp: 90 tablet, Rfl: 1   MAGNESIUM GLYCINATE PO, Take 350 mg by mouth., Disp: , Rfl:    melatonin 5 MG TABS, Take by mouth at bedtime. 1-2 tablets, Disp: , Rfl:    methylPREDNISolone (MEDROL DOSEPAK) 4 MG TBPK tablet, Take 6 tablets on day 1.  Take 5 tablets on day 2.  Take 4 tablets on day 3.  Take 3 tablets on day 4.  Take 2 tablets on day 5.  Take 1 tablet on  day 6., Disp: 21 tablet, Rfl: 0   OVER THE COUNTER MEDICATION, daily. Essential Oils, Disp: , Rfl:    oxyCODONE-acetaminophen (PERCOCET/ROXICET) 5-325 MG tablet, Take 1 tablet by mouth every 8 (eight) hours as needed for severe pain., Disp: 15 tablet, Rfl: 0   predniSONE (DELTASONE) 50 MG tablet, Take 1 tablet (50 mg total) by mouth daily. Take if the nerve pain worsens, Disp: 5 tablet, Rfl: 0   Objective:     There were no vitals filed for this visit.    There is no height or weight on file to calculate BMI.    Physical Exam:    ***   Electronically signed by:  Brenda Mcfarland D.[facility] 12:28 PM 03/24/23

## 2023-03-25 ENCOUNTER — Encounter: Payer: Medicare HMO | Admitting: Physical Therapy

## 2023-03-25 ENCOUNTER — Ambulatory Visit (INDEPENDENT_AMBULATORY_CARE_PROVIDER_SITE_OTHER): Payer: Medicare HMO | Admitting: Sports Medicine

## 2023-03-25 ENCOUNTER — Encounter: Payer: Self-pay | Admitting: Sports Medicine

## 2023-03-25 ENCOUNTER — Ambulatory Visit: Payer: Medicare HMO

## 2023-03-25 VITALS — BP 136/78 | HR 74 | Ht 67.0 in | Wt 243.0 lb

## 2023-03-25 DIAGNOSIS — M5416 Radiculopathy, lumbar region: Secondary | ICD-10-CM | POA: Diagnosis not present

## 2023-03-25 DIAGNOSIS — M25552 Pain in left hip: Secondary | ICD-10-CM | POA: Diagnosis not present

## 2023-03-25 DIAGNOSIS — M1612 Unilateral primary osteoarthritis, left hip: Secondary | ICD-10-CM

## 2023-03-25 MED ORDER — METHYLPREDNISOLONE ACETATE 80 MG/ML IJ SUSP
80.0000 mg | Freq: Once | INTRAMUSCULAR | Status: AC
  Administered 2023-03-25: 80 mg via INTRAMUSCULAR

## 2023-03-25 MED ORDER — KETOROLAC TROMETHAMINE 60 MG/2ML IM SOLN
60.0000 mg | Freq: Once | INTRAMUSCULAR | Status: AC
  Administered 2023-03-25: 60 mg via INTRAMUSCULAR

## 2023-03-25 NOTE — Patient Instructions (Signed)
Thank you for coming in today.  You received a combination injection of methylprednisone and Toradol today in clinic.  We will be ordering lumbar MRI and left hip MRI without contrast to [facility] imaging.  When they contact you to schedule imaging, you may ask to be put on a cancellation list to hopefully get in for imaging sooner.  When you find out what day you will have imaging, call our clinic and ask for a nursing visit for a methylprednisone and Toradol injection prior to imaging, as requested by Dr. Jean Rosenthal.  Recommend alternating Advil 220 mg 1 to 2 tablets and Tylenol 320 mg every 6 hours and can use opioids for severe breakthrough pain  Once you have your MRI scheduled, call our clinic and schedule a follow-up for 4 days after MRI to review results and discuss treatment plan.

## 2023-03-25 NOTE — Addendum Note (Signed)
Addended by: Evon Slack on: 03/25/2023 03:01 PM   Modules accepted: Orders

## 2023-03-26 ENCOUNTER — Telehealth: Payer: Self-pay | Admitting: Sports Medicine

## 2023-03-26 NOTE — Telephone Encounter (Signed)
Injections given ventrolateral aspect(s) of gluteal region using z-track method.

## 2023-03-26 NOTE — Telephone Encounter (Signed)
Patient's daughter called looking for advice in reference to the MRIs that were ordered.  She said that after speaking to the imaging center, each MRI will take 40 minutes. She is concerned about laying in the MRI for that long period of time.  She asked if it would be better to have it done at the hospital so that she can be put under or what Dr Jean Rosenthal would suggest?  She also said that Dr Jean Rosenthal mentioned possibly doing another cocktail injection before the MRI, but the cocktail yesterday has not helped like before so they do not know if that would be helpful either?  Please advise.  Call daughter: [phone] (Mom gets confused and has trouble remembering)

## 2023-03-29 NOTE — Telephone Encounter (Signed)
I do not know what to do for this patient other than her going to the actual hospital to be seen and just getting it done that way. I have changed this location 4 times now. That's 2 different images with 2 different insurances. And the whole reason Dr. Denyse Amass ordered the CT is because she couldn't handle being in the MRI that long, which she was aware of. This CT has been authorized already for [employer]. I

## 2023-03-29 NOTE — Telephone Encounter (Signed)
Pt's daughter called back & would just like to have a referral for Ortho sent to D. O'toole at [facility] in [place].

## 2023-03-30 ENCOUNTER — Other Ambulatory Visit: Payer: Self-pay | Admitting: Sports Medicine

## 2023-03-30 DIAGNOSIS — S39012A Strain of muscle, fascia and tendon of lower back, initial encounter: Secondary | ICD-10-CM

## 2023-03-30 DIAGNOSIS — M25552 Pain in left hip: Secondary | ICD-10-CM

## 2023-03-30 DIAGNOSIS — M5416 Radiculopathy, lumbar region: Secondary | ICD-10-CM

## 2023-03-30 DIAGNOSIS — M19011 Primary osteoarthritis, right shoulder: Secondary | ICD-10-CM

## 2023-03-30 DIAGNOSIS — M1612 Unilateral primary osteoarthritis, left hip: Secondary | ICD-10-CM

## 2023-03-30 NOTE — Progress Notes (Unsigned)
Ortho sent to D. O'toole at [facility] in [place].  Referral placed

## 2023-03-30 NOTE — Telephone Encounter (Signed)
Placed referral to Dr. Laurian Brim

## 2023-03-31 ENCOUNTER — Encounter: Payer: Self-pay | Admitting: Family Medicine

## 2023-03-31 ENCOUNTER — Encounter: Payer: Self-pay | Admitting: Sports Medicine

## 2023-03-31 NOTE — Telephone Encounter (Signed)
Last OV 03/25/23  Last refill 03/22/23 Qty #30/0

## 2023-03-31 NOTE — Telephone Encounter (Signed)
Controlled substance, forwarding to Dr. Georgina Snell

## 2023-04-01 MED ORDER — HYDROCODONE-ACETAMINOPHEN 10-325 MG PO TABS: 1.0000 | ORAL_TABLET | Freq: Three times a day (TID) | ORAL | 0 refills | Status: DC | PRN

## 2023-04-01 NOTE — Telephone Encounter (Signed)
I did prescribe the medication but sent her a message saying we need a better long-term plan.

## 2023-04-05 ENCOUNTER — Telehealth: Payer: Self-pay | Admitting: Family Medicine

## 2023-04-05 DIAGNOSIS — M5136 Other intervertebral disc degeneration, lumbar region: Secondary | ICD-10-CM | POA: Diagnosis not present

## 2023-04-05 DIAGNOSIS — M5416 Radiculopathy, lumbar region: Secondary | ICD-10-CM | POA: Diagnosis not present

## 2023-04-05 NOTE — Telephone Encounter (Signed)
Patient called asking that we change the MRI order to an Open MRI. She is aware that this will change the location and authorization that has been done.  Please advise.

## 2023-04-06 NOTE — Progress Notes (Deleted)
Initial neurology clinic note  Karlee Sieczkowski MRN: [id number] DOB: [birth year]  Referring provider: [name], NP  Primary care provider: [name], NP  Reason for consult:  Dizziness  Subjective:  This is Ms. Johnie Stonehouse, a 77 y.o. ***-handed female with a medical history of cervical spondylosis s/p C3-C6 surgery, HTN, HLD, DM, OSA, depression, chronic fatigue, OA, vit D deficiency*** who presents to neurology clinic with dizziness***. The patient is accompanied by ***.  *** Chronic vertigo - like on a boat, rocking Not able to think well as a result Tried meclizine, made her dizzy  Patient was referred to vestibular rehab by PCP on 03/26/22.*** Saw ENT? Had Epley in the past, did not find helpful  MRI of lumbar spine? 03/08/23?***  On opioids?*** On gabapentin for shoulder pain***  Patient was seen at [facility] by Dr. Rosalyn Gess on 11/27/20. Per that clinic note: Ms. Eneida Bassi is a 77 y.o. female referred for confusion and clumsiness. Majority of her symptoms started after she did had Covid. Patient stated that she had Covid twice. She stated since then she has had confusion involving difficulty with memory she also endorses difficulty holding onto objects and shakiness. Some anxiety. Patient also states that she has vertigo however has had vertigo for years even before Covid. One of the concerns was could this be iatrogenic she was on temazepam she has stopped then since then her memory and thought processing per patient and patient's daughter has improved.Marland Kitchen     MEDICATIONS:  Outpatient Encounter Medications as of 04/15/2023  Medication Sig   amLODipine (NORVASC) 5 MG tablet Take 1 tablet (5 mg total) by mouth every other day.   HYDROcodone-acetaminophen (NORCO) 10-325 MG tablet Take 1 tablet by mouth every 8 (eight) hours as needed.   Ibuprofen 200 MG CAPS Take by mouth as needed.   latanoprost (XALATAN) 0.005 % ophthalmic solution Place 1  drop into both eyes at bedtime.   losartan (COZAAR) 50 MG tablet Take 1 tablet (50 mg total) by mouth daily.   MAGNESIUM GLYCINATE PO Take 350 mg by mouth.   melatonin 5 MG TABS Take by mouth at bedtime. 1-2 tablets   methylPREDNISolone (MEDROL DOSEPAK) 4 MG TBPK tablet Take 6 tablets on day 1.  Take 5 tablets on day 2.  Take 4 tablets on day 3.  Take 3 tablets on day 4.  Take 2 tablets on day 5.  Take 1 tablet on day 6.   OVER THE COUNTER MEDICATION daily. Essential Oils   oxyCODONE-acetaminophen (PERCOCET/ROXICET) 5-325 MG tablet Take 1 tablet by mouth every 8 (eight) hours as needed for severe pain.   predniSONE (DELTASONE) 50 MG tablet Take 1 tablet (50 mg total) by mouth daily. Take if the nerve pain worsens   No facility-administered encounter medications on file as of 04/15/2023.    PAST MEDICAL HISTORY: Past Medical History:  Diagnosis Date   Abnormal EKG 10/23/2016   Arthritis 01/18/2020   Chronic fatigue 10/08/2015   Depression 01/18/2020   Diabetes mellitus (HCC) 10/08/2015   Formatting of this note might be different from the original. Overview:  last Hgb A1C 7.2/no blood sugars at home Formatting of this note might be different from the original. last Hgb A1C 7.2/no blood sugars at home   ESR raised 11/27/2016   Frequent urination 12/16/2018   Glaucoma 10/08/2015   High cholesterol    History of pancreatitis 07/11/2014   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/23/2016   Hypertension 10/08/2015   IFG (  impaired fasting glucose) 10/24/2019   Incomplete emptying of bladder 11/30/2017   Knee pain, left 12/22/2016   Loss of memory 01/18/2020   Major depressive disorder in partial remission (HCC) 10/08/2015   Morbid obesity (HCC) 10/23/2016   Obesity (BMI 30-39.9) 11/10/2016   Obstructive sleep apnea 12/16/2018   Osteoarthritis of both knees 11/27/2016   Other insomnia 03/05/2017   Palpitations 10/23/2016   Positive ANA (antinuclear antibody) 10/23/2016   Sleep apnea 10/08/2015   Formatting of this note might be  different from the original. Overview:  does not use CPAP Formatting of this note might be different from the original. does not use CPAP   Sore neck 10/24/2019   Spondylosis of cervical spine with myelopathy 01/18/2020   Type 2 diabetes mellitus without complication, without long-term current use of insulin (HCC) 12/16/2018   Vitamin D deficiency 10/08/2015    PAST SURGICAL HISTORY: Past Surgical History:  Procedure Laterality Date   APPENDECTOMY     age 36   CESAREAN SECTION     X 3   NECK SURGERY  2015   SPINE SURGERY  05/29/2015    ALLERGIES: Allergies  Allergen Reactions   Amlodipine Swelling   Aripiprazole Other (See Comments)    Tongue twisted, lips curled up Tongue twisted, lips curled up    Carvedilol Other (See Comments)    Nightmares Nightmares    Hydralazine     Diarrhea, abdominal pain   Irbesartan     dizziness   Meclizine Other (See Comments)    Dizziness    Paroxetine Hcl Other (See Comments)    . Marland Kitchen    Tizanidine Other (See Comments)    Syncope Syncope    Tramadol Other (See Comments)    Unknown   Zolpidem Other (See Comments)    Sleepwalking Sleepwalking    Zyrtec [Cetirizine Hcl]     Dizziness    FAMILY HISTORY: Family History  Problem Relation Age of Onset   Diabetes Mother    Multiple sclerosis Mother    Heart disease Father    Heart Problems Father    Migraines Daughter    Thyroid disease Daughter    Migraines Daughter     SOCIAL HISTORY: Social History   Tobacco Use   Smoking status: Former    Current packs/day: 0.00    Average packs/day: 1 pack/day for 9.0 years (9.0 ttl pk-yrs)    Types: Cigarettes    Start date: 1964    Quit date: 25    Years since quitting: 51.6   Smokeless tobacco: Never  Vaping Use   Vaping status: Never Used  Substance Use Topics   Alcohol use: Not Currently   Drug use: Not Currently   Social History   Social History Narrative   Tobacco use, amount per day now: No   Past tobacco use,  amount per day: 10 years. , 1 pack daily.   How many years did you use tobacco: 10 years   Alcohol use (drinks per week): None.   Diet: Simple- Eggs, Sausage, Toast, Chicken/Beef Salad or Vegetables.    Do you drink/eat things with caffeine: No   Marital status:   Divorced                               What year were you married? 1968   Do you live in a house, apartment, assisted living, condo, trailer, etc.? House.   Is it one or  more stories? One   How many persons live in your home? Self.   Do you have pets in your home?( please list) 1 [name]    Highest Level of education completed? Undergrad.    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   Do you exercise? No                                 Type and how often?   Do you have a living will? No   Do you have a DNR form? Yes                                  If not, do you want to discuss one? No   Do you have signed POA/HPOA forms? Yes                       If so, please bring to you appointment      Do you have any difficulty bathing or dressing yourself? Yes   Do you have any difficulty preparing food or eating? Yes   Do you have any difficulty managing your medications? No   Do you have any difficulty managing your finances? No   Do you have any difficulty affording your medications? No    Objective:  Vital Signs:  There were no vitals taken for this visit.  ***  Labs and Imaging review: Internal labs: Lab Results  Component Value Date   HGBA1C 7.0 (H) 02/23/2023   Lab Results  Component Value Date   VITAMINB12 560 11/18/2020   Lab Results  Component Value Date   TSH 2.82 05/01/2022   No results found for: "ESRSEDRATE", "POCTSEDRATE"  02/23/23: CBC w/ diff unremarkable CMP significant for mildly elevated glucose (123)  Vit D (05/01/22): wnl  External labs: ***  Imaging: MRI lumbar spine wo contrast (03/08/23???***): ***  Cervical spine xray (10/22/22): FINDINGS: The cervical spine is  only well visualized to the bottom of C6 on lateral imaging. Anterior plate and screw fusion from C3 through C6 remains. Hardware is in good position. No malalignment. Pre odontoid space and prevertebral soft tissues are normal. Mild degenerative disc disease at C2-3. Uncovertebral degenerative changes identified, right greater than left. No other bony or soft tissue abnormalities are noted.   IMPRESSION: 1. Anterior plate and screw fusion from C3 through C6 remains. Hardware is in good position. 2. Degenerative changes as above.  CT head wo contrast (05/14/22): FINDINGS: Brain: No intracranial hemorrhage, mass effect, or evidence of acute infarct. No hydrocephalus. No extra-axial fluid collection. Age commensurate generalized cerebral atrophy and chronic small vessel ischemic disease.   Vascular: No hyperdense vessel. Intracranial arterial calcification.   Skull: No fracture or focal lesion.   Sinuses/Orbits: No acute finding. Paranasal sinuses and mastoid air cells are well aerated.   Other: None.   IMPRESSION: No acute intracranial abnormality. ***  Assessment/Plan:  Charlesetta Goddard is a 77 y.o. female who presents for evaluation of ***. *** has a relevant medical history of ***. *** neurological examination is pertinent for ***. Available diagnostic data is significant for ***. This constellation of symptoms and objective data would most likely localize to ***. ***  PLAN: -Blood work: *** ***  -Return to clinic ***  The impression above as well as the plan as outlined  below were extensively discussed with the patient (in the company of ***) who voiced understanding. All questions were answered to their satisfaction.  The patient was counseled on pertinent fall precautions per the printed material provided today, and as noted under the "Patient Instructions" section below.***  When available, results of the above investigations and possible further recommendations will  be communicated to the patient via telephone/MyChart. Patient to call office if not contacted after expected testing turnaround time.   Total time spent reviewing records, interview, history/exam, documentation, and coordination of care on day of encounter:  *** min   Thank you for allowing me to participate in patient's care.  If I can answer any additional questions, I would be pleased to do so.  Jacquelyne Balint, MD   CC: Janyth Contes Janene Harvey, NP [address] [place] [address]  CC: Referring provider: [name], NP [address] [place],  [place] [address]

## 2023-04-07 ENCOUNTER — Ambulatory Visit: Payer: Medicare HMO | Admitting: Neurology

## 2023-04-08 NOTE — Telephone Encounter (Signed)
It is going to take a long time to get the MRI done if you want an open MRI.  Also an open MRI is still very tight and restrictive.  My advice is proceed with the CT myelogram we already have ordered.  Every time we change the plan we have to authorize it again for a new location or a new protocol.

## 2023-04-08 NOTE — Telephone Encounter (Signed)
Spoke to pt and relayed this information. Going forward she stated that she is going to work w/ the "orthopedist" in managing her care. She said they were planning on proceeding w/ the "open" MRI moving forward. I re-enforced w/ her the confusion it creates to go back and forth between providers, but that we want to be sure she gets the best care. Also re-enforced that if the "orthopedist" was prescribing her pain rx/opiates, that she can't then expect [facility] to refill these rx. She verbalized understanding. Pt did seem to be a bit confused and noted that she had taken some medication that makes her forgetful and fuzzy. She verbalized appreciation.

## 2023-04-09 DIAGNOSIS — H35372 Puckering of macula, left eye: Secondary | ICD-10-CM | POA: Diagnosis not present

## 2023-04-09 DIAGNOSIS — H524 Presbyopia: Secondary | ICD-10-CM | POA: Diagnosis not present

## 2023-04-09 DIAGNOSIS — E119 Type 2 diabetes mellitus without complications: Secondary | ICD-10-CM | POA: Diagnosis not present

## 2023-04-09 DIAGNOSIS — H52203 Unspecified astigmatism, bilateral: Secondary | ICD-10-CM | POA: Diagnosis not present

## 2023-04-09 DIAGNOSIS — H04123 Dry eye syndrome of bilateral lacrimal glands: Secondary | ICD-10-CM | POA: Diagnosis not present

## 2023-04-09 DIAGNOSIS — H401131 Primary open-angle glaucoma, bilateral, mild stage: Secondary | ICD-10-CM | POA: Diagnosis not present

## 2023-04-09 DIAGNOSIS — H25012 Cortical age-related cataract, left eye: Secondary | ICD-10-CM | POA: Diagnosis not present

## 2023-04-09 DIAGNOSIS — Z7984 Long term (current) use of oral hypoglycemic drugs: Secondary | ICD-10-CM | POA: Diagnosis not present

## 2023-04-09 DIAGNOSIS — H11153 Pinguecula, bilateral: Secondary | ICD-10-CM | POA: Diagnosis not present

## 2023-04-09 DIAGNOSIS — H2513 Age-related nuclear cataract, bilateral: Secondary | ICD-10-CM | POA: Diagnosis not present

## 2023-04-09 DIAGNOSIS — H5203 Hypermetropia, bilateral: Secondary | ICD-10-CM | POA: Diagnosis not present

## 2023-04-09 LAB — HM DIABETES EYE EXAM

## 2023-04-15 ENCOUNTER — Ambulatory Visit: Payer: Medicare HMO | Admitting: Neurology

## 2023-04-15 DIAGNOSIS — G4733 Obstructive sleep apnea (adult) (pediatric): Secondary | ICD-10-CM | POA: Diagnosis not present

## 2023-04-20 DIAGNOSIS — M48061 Spinal stenosis, lumbar region without neurogenic claudication: Secondary | ICD-10-CM | POA: Diagnosis not present

## 2023-04-20 DIAGNOSIS — G894 Chronic pain syndrome: Secondary | ICD-10-CM | POA: Diagnosis not present

## 2023-04-26 DIAGNOSIS — Z888 Allergy status to other drugs, medicaments and biological substances status: Secondary | ICD-10-CM | POA: Diagnosis not present

## 2023-04-26 DIAGNOSIS — Z79899 Other long term (current) drug therapy: Secondary | ICD-10-CM | POA: Diagnosis not present

## 2023-04-26 DIAGNOSIS — M1611 Unilateral primary osteoarthritis, right hip: Secondary | ICD-10-CM | POA: Diagnosis not present

## 2023-04-26 DIAGNOSIS — Z885 Allergy status to narcotic agent status: Secondary | ICD-10-CM | POA: Diagnosis not present

## 2023-04-26 DIAGNOSIS — M5116 Intervertebral disc disorders with radiculopathy, lumbar region: Secondary | ICD-10-CM | POA: Diagnosis not present

## 2023-04-26 DIAGNOSIS — M5136 Other intervertebral disc degeneration, lumbar region: Secondary | ICD-10-CM | POA: Diagnosis not present

## 2023-04-26 DIAGNOSIS — M4726 Other spondylosis with radiculopathy, lumbar region: Secondary | ICD-10-CM | POA: Diagnosis not present

## 2023-04-26 DIAGNOSIS — M1612 Unilateral primary osteoarthritis, left hip: Secondary | ICD-10-CM | POA: Diagnosis not present

## 2023-04-27 DIAGNOSIS — M5135 Other intervertebral disc degeneration, thoracolumbar region: Secondary | ICD-10-CM | POA: Diagnosis not present

## 2023-04-27 DIAGNOSIS — M47816 Spondylosis without myelopathy or radiculopathy, lumbar region: Secondary | ICD-10-CM | POA: Diagnosis not present

## 2023-04-27 DIAGNOSIS — M47817 Spondylosis without myelopathy or radiculopathy, lumbosacral region: Secondary | ICD-10-CM | POA: Diagnosis not present

## 2023-04-27 DIAGNOSIS — M5136 Other intervertebral disc degeneration, lumbar region: Secondary | ICD-10-CM | POA: Diagnosis not present

## 2023-04-27 DIAGNOSIS — M5137 Other intervertebral disc degeneration, lumbosacral region: Secondary | ICD-10-CM | POA: Diagnosis not present

## 2023-04-27 DIAGNOSIS — M4807 Spinal stenosis, lumbosacral region: Secondary | ICD-10-CM | POA: Diagnosis not present

## 2023-05-06 DIAGNOSIS — M47816 Spondylosis without myelopathy or radiculopathy, lumbar region: Secondary | ICD-10-CM | POA: Diagnosis not present

## 2023-05-06 DIAGNOSIS — M48061 Spinal stenosis, lumbar region without neurogenic claudication: Secondary | ICD-10-CM | POA: Diagnosis not present

## 2023-05-06 DIAGNOSIS — M5136 Other intervertebral disc degeneration, lumbar region: Secondary | ICD-10-CM | POA: Diagnosis not present

## 2023-05-11 ENCOUNTER — Ambulatory Visit: Payer: Medicare HMO | Admitting: Cardiology

## 2023-05-13 ENCOUNTER — Ambulatory Visit: Payer: Medicare HMO

## 2023-05-16 DIAGNOSIS — G4733 Obstructive sleep apnea (adult) (pediatric): Secondary | ICD-10-CM | POA: Diagnosis not present

## 2023-05-18 ENCOUNTER — Telehealth: Payer: Self-pay

## 2023-05-18 NOTE — Telephone Encounter (Signed)
Pt is overdue for office visit, recommend in person evaluation

## 2023-05-18 NOTE — Telephone Encounter (Signed)
Discussed response with Victorino Dike and she emphasized that that is part of the problem, she can hardly get her anywhere, despite the challenges with ambulating Victorino Dike scheduled an appointment for Friday with Shanda Bumps, refused earlier appointment with a different provider

## 2023-05-18 NOTE — Telephone Encounter (Signed)
Message left on clinical intake voicemail:   Patients daughter would like to know what is the next step in getting her mother help for decreased mobility "She is unable to move around." Patient seen orthopedic specialist, however no improvement. Please advise

## 2023-05-21 ENCOUNTER — Ambulatory Visit (INDEPENDENT_AMBULATORY_CARE_PROVIDER_SITE_OTHER): Payer: Medicare HMO | Admitting: Nurse Practitioner

## 2023-05-21 ENCOUNTER — Encounter: Payer: Self-pay | Admitting: Nurse Practitioner

## 2023-05-21 VITALS — BP 132/86 | HR 87 | Temp 97.1°F | Ht 67.0 in | Wt 229.0 lb

## 2023-05-21 DIAGNOSIS — I10 Essential (primary) hypertension: Secondary | ICD-10-CM

## 2023-05-21 DIAGNOSIS — Z7189 Other specified counseling: Secondary | ICD-10-CM | POA: Diagnosis not present

## 2023-05-21 DIAGNOSIS — R42 Dizziness and giddiness: Secondary | ICD-10-CM

## 2023-05-21 DIAGNOSIS — M19041 Primary osteoarthritis, right hand: Secondary | ICD-10-CM

## 2023-05-21 DIAGNOSIS — E1169 Type 2 diabetes mellitus with other specified complication: Secondary | ICD-10-CM | POA: Diagnosis not present

## 2023-05-21 DIAGNOSIS — F325 Major depressive disorder, single episode, in full remission: Secondary | ICD-10-CM

## 2023-05-21 DIAGNOSIS — E785 Hyperlipidemia, unspecified: Secondary | ICD-10-CM

## 2023-05-21 DIAGNOSIS — G4733 Obstructive sleep apnea (adult) (pediatric): Secondary | ICD-10-CM | POA: Diagnosis not present

## 2023-05-21 DIAGNOSIS — I471 Supraventricular tachycardia, unspecified: Secondary | ICD-10-CM

## 2023-05-21 NOTE — Progress Notes (Signed)
Careteam: Patient Care Team: Brenda Seller, NP as PCP - General (Geriatric Medicine) Brenda Ripple, DO as PCP - Cardiology (Cardiology) Brenda Koh, MD as Referring Physician (Orthopedic Surgery) Brenda Lea, MD as Consulting Physician (Cardiology)  PLACE OF SERVICE:  [facility]  Advanced Directive information Does Patient Have a Medical Advance Directive?: No, Would patient like information on creating a medical advance directive?: Yes (MAU/Ambulatory/Procedural Areas - Information given)  Allergies  Allergen Reactions   Amlodipine Swelling   Aripiprazole Other (See Comments)    Tongue twisted, lips curled up Tongue twisted, lips curled up    Carvedilol Other (See Comments)    Nightmares Nightmares    Hydralazine     Diarrhea, abdominal pain   Irbesartan     dizziness   Meclizine Other (See Comments)    Dizziness    Paroxetine Hcl Other (See Comments)    . Marland Kitchen    Tizanidine Other (See Comments)    Syncope Syncope    Tramadol Other (See Comments)    Unknown   Zolpidem Other (See Comments)    Sleepwalking Sleepwalking    Zyrtec [Cetirizine Hcl]     Dizziness    Chief Complaint  Patient presents with   Acute Visit    Decreased mobility and pain in hip. Foot exam. Discuss need for eye exam, diabetic kidney evaluation, shingrix, and flu vaccine.      HPI: Patient is a 77 y.o. female presents for chronic condition follow-up. Accompanied by her daughter and daughter-in-law.  States about 4 months ago she saw a chiropractor that lead to extreme intermittent left hip pain. Patient is either sharp or dull. Reports she had an MRI which showed DDD, stenosis and arthritis. She will be starting PT to help with this pain. She's seen sports medicine and orthopedics through [facility].  She is no longer independent, she's requiring a lot more help now. She's not able to stand up long enough to cook for herself, therefore, her diet has been  dependent on family/friends that bring her food ane she reports a decreased appetite.   States she is requiring hydrocodone for her hip pain but that has worsen her vertigo to the point she feels very nauseated. Her vertigo has been ongoing for a few years but it was starting to get better before she started the hydrocodone. Denies falls but has had many frightening episodes where her vertigo got out of control.   She has lost about 15 lbs despite her decreased physical activity, daughters have concern she's lost muscle mass. Occasional constipation with the hydrocodone but taking Miralax which is effective.   Reports getting up often at night due to urinary urgency/frequency and she's not sleeping much due to this issue but this is not new.   She wants to know what other options she has for pain management. Her and her daughters are having a hard time navigating all these changes alone and want to see if they are able to receive home care services and help coordinating her care.  Hx of OSA, not wearing CPAP at night. Denies history of recurrent heart palpitations or chest pain.   Review of Systems:  Review of Systems  Constitutional:  Positive for malaise/fatigue. Negative for chills and fever.       Hot flashes  Respiratory:  Negative for cough and shortness of breath.   Cardiovascular:  Negative for chest pain, palpitations and leg swelling.  Gastrointestinal:  Positive for constipation and nausea. Negative for  diarrhea and vomiting.  Genitourinary:  Positive for frequency and urgency. Negative for dysuria.  Neurological:  Positive for dizziness and weakness. Negative for headaches.  Psychiatric/Behavioral:  Negative for depression. The patient has insomnia. The patient is not nervous/anxious.     Past Medical History:  Diagnosis Date   Abnormal EKG 10/23/2016   Arthritis 01/18/2020   Chronic fatigue 10/08/2015   Depression 01/18/2020   Diabetes mellitus (HCC) 10/08/2015   Formatting of  this note might be different from the original. Overview:  last Hgb A1C 7.2/no blood sugars at home Formatting of this note might be different from the original. last Hgb A1C 7.2/no blood sugars at home   ESR raised 11/27/2016   Frequent urination 12/16/2018   Glaucoma 10/08/2015   High cholesterol    History of pancreatitis 07/11/2014   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/23/2016   Hypertension 10/08/2015   IFG (impaired fasting glucose) 10/24/2019   Incomplete emptying of bladder 11/30/2017   Knee pain, left 12/22/2016   Loss of memory 01/18/2020   Major depressive disorder in partial remission (HCC) 10/08/2015   Morbid obesity (HCC) 10/23/2016   Obesity (BMI 30-39.9) 11/10/2016   Obstructive sleep apnea 12/16/2018   Osteoarthritis of both knees 11/27/2016   Other insomnia 03/05/2017   Palpitations 10/23/2016   Positive ANA (antinuclear antibody) 10/23/2016   Sleep apnea 10/08/2015   Formatting of this note might be different from the original. Overview:  does not use CPAP Formatting of this note might be different from the original. does not use CPAP   Sore neck 10/24/2019   Spondylosis of cervical spine with myelopathy 01/18/2020   Type 2 diabetes mellitus without complication, without long-term current use of insulin (HCC) 12/16/2018   Vitamin D deficiency 10/08/2015   Past Surgical History:  Procedure Laterality Date   APPENDECTOMY     age 62   CESAREAN SECTION     X 3   NECK SURGERY  2015   SPINE SURGERY  05/29/2015   Social History:   reports that she quit smoking about 51 years ago. Her smoking use included cigarettes. She started smoking about 60 years ago. She has a 9 pack-year smoking history. She has never used smokeless tobacco. She reports that she does not currently use alcohol. She reports that she does not currently use drugs.  Family History  Problem Relation Age of Onset   Diabetes Mother    Multiple sclerosis Mother    Heart disease Father    Heart Problems Father    Migraines Daughter     Thyroid disease Daughter    Migraines Daughter     Medications: Patient's Medications  New Prescriptions   No medications on file  Previous Medications   ACETAMINOPHEN (TYLENOL) 500 MG TABLET    Take 1,000 mg by mouth 2 (two) times daily as needed.   AMLODIPINE (NORVASC) 5 MG TABLET    Take 5 mg by mouth daily.   HYDROCODONE-ACETAMINOPHEN (NORCO) 10-325 MG TABLET    Take 1 tablet by mouth every 6 (six) hours as needed.   IBUPROFEN 200 MG CAPS    Take by mouth as needed.   LATANOPROST (XALATAN) 0.005 % OPHTHALMIC SOLUTION    Place 1 drop into both eyes at bedtime.   LOSARTAN (COZAAR) 50 MG TABLET    Take 1 tablet (50 mg total) by mouth daily.   MAGNESIUM GLYCINATE PO    Take 350 mg by mouth.   MELATONIN 5 MG TABS    Take by mouth at  bedtime. 1-2 tablets   OVER THE COUNTER MEDICATION    daily. Essential Oils   POLYETHYLENE GLYCOL (MIRALAX / GLYCOLAX) 17 G PACKET    Take 17 g by mouth as needed.  Modified Medications   No medications on file  Discontinued Medications   AMLODIPINE (NORVASC) 5 MG TABLET    Take 1 tablet (5 mg total) by mouth every other day.   HYDROCODONE-ACETAMINOPHEN (NORCO) 10-325 MG TABLET    Take 1 tablet by mouth every 8 (eight) hours as needed.   METHYLPREDNISOLONE (MEDROL DOSEPAK) 4 MG TBPK TABLET    Take 6 tablets on day 1.  Take 5 tablets on day 2.  Take 4 tablets on day 3.  Take 3 tablets on day 4.  Take 2 tablets on day 5.  Take 1 tablet on day 6.   OXYCODONE-ACETAMINOPHEN (PERCOCET/ROXICET) 5-325 MG TABLET    Take 1 tablet by mouth every 8 (eight) hours as needed for severe pain.   PREDNISONE (DELTASONE) 50 MG TABLET    Take 1 tablet (50 mg total) by mouth daily. Take if the nerve pain worsens    Physical Exam:  Vitals:   05/21/23 1452  BP: 132/86  Pulse: 87  Temp: (!) 97.1 F (36.2 C)  TempSrc: Temporal  SpO2: 98%  Weight: 103.9 kg  Height: 5\' 7"  (1.702 m)   Body mass index is 35.87 kg/m. Wt Readings from Last 3 Encounters:  05/21/23 103.9 kg   03/25/23 110.2 kg  03/03/23 110.2 kg    Physical Exam Constitutional:      Appearance: Normal appearance. She is obese.  Cardiovascular:     Rate and Rhythm: Normal rate and regular rhythm.     Pulses: Normal pulses.     Heart sounds: Normal heart sounds.  Pulmonary:     Effort: Pulmonary effort is normal.     Breath sounds: Normal breath sounds.  Abdominal:     General: Bowel sounds are normal.     Palpations: Abdomen is soft.  Skin:    General: Skin is warm and dry.  Neurological:     General: No focal deficit present.     Mental Status: She is alert and oriented to person, place, and time. Mental status is at baseline.     Motor: Weakness present.  Psychiatric:        Mood and Affect: Mood normal.        Behavior: Behavior normal.     Labs reviewed: Basic Metabolic Panel: Recent Labs    02/23/23 1212  NA 139  K 4.2  CL 106  CO2 25  GLUCOSE 123*  BUN 24*  CREATININE 0.74  CALCIUM 9.2   Liver Function Tests: Recent Labs    02/23/23 1212  AST 13  ALT 16  ALKPHOS 60  BILITOT 0.6  PROT 6.5  ALBUMIN 4.1   No results for input(s): "LIPASE", "AMYLASE" in the last 8760 hours. No results for input(s): "AMMONIA" in the last 8760 hours. CBC: Recent Labs    02/23/23 1212  WBC 7.3  NEUTROABS 4.7  HGB 13.8  HCT 41.3  MCV 94.8  PLT 256.0   Lipid Panel: No results for input(s): "CHOL", "HDL", "LDLCALC", "TRIG", "CHOLHDL", "LDLDIRECT" in the last 8760 hours. TSH: No results for input(s): "TSH" in the last 8760 hours. A1C: Lab Results  Component Value Date   HGBA1C 7.0 (H) 02/23/2023     Assessment/Plan 1. Primary hypertension -Controlled, Continue current medication regimen with dietary modification   2. Vertigo -  Ongoing, neurology has seen and did not have answers. She is planning to see a new neurologist- appt scheduled but unable to go due to pain. Now vertigo is worsened by hydrocodone; recommended consulting with sports medicine to see what  other options they had for pain control since they are managing Continues exercises through PT  3. Type 2 diabetes mellitus with other specified complication, without long-term current use of insulin (HCC) -A1c slightly up likely due to immobility and recent use of prednisone due to back pain. -Encouraged dietary modifications and physical activity as tolerated  4. Hyperlipidemia LDL goal <70 -Encouraged dietary modifications and physical activity as tolerated  5. Paroxysmal SVT (supraventricular tachycardia) -No recent issues -Denies chest pain, palpitations  6. Morbid obesity (HCC) -BMI 35 with htn, arthritis, OSA.  -Encouraged dietary modifications and physical activity as tolerated  7. Primary osteoarthritis of right hand -Stable, no flare ups  8. OSA (obstructive sleep apnea) -Not wearing CPAP   9. Major depression in remission (HCC) -Stable -Denies feeling depressed, she's just dealing with a lot and is overwhelmed Continue supportive care -consider cymbalta as this could help with pain management as well.   10. Complex care coordination -long discussion on resources and how to navigate her needing increase assistance. Discuss getting recommendations on DME from PT Encouraged reaching out to insurance and home care health services company to see what case manager/social worker assistance they could provide   Return in about 3 months (around 08/20/2023) for routine follow up .   Rollen Sox, FNP-MSN Student -I personally was present during the history, physical exam and medical decision-making activities of this service and have verified that the service and findings are accurately documented in the student's note Abbey Chatters, NP

## 2023-05-23 ENCOUNTER — Other Ambulatory Visit: Payer: Medicare HMO

## 2023-05-31 ENCOUNTER — Encounter: Payer: Self-pay | Admitting: Nurse Practitioner

## 2023-05-31 DIAGNOSIS — R42 Dizziness and giddiness: Secondary | ICD-10-CM

## 2023-06-01 ENCOUNTER — Ambulatory Visit: Payer: Medicare HMO | Admitting: Physical Therapy

## 2023-06-01 ENCOUNTER — Encounter: Payer: Self-pay | Admitting: Nurse Practitioner

## 2023-06-01 NOTE — Telephone Encounter (Signed)
Referral order is pending for provider to associate and complete

## 2023-06-01 NOTE — Telephone Encounter (Signed)
Okay to approve PT for vestibular rehab

## 2023-06-03 ENCOUNTER — Telehealth: Payer: Self-pay | Admitting: Nurse Practitioner

## 2023-06-03 ENCOUNTER — Ambulatory Visit: Payer: Medicare HMO

## 2023-06-03 DIAGNOSIS — R42 Dizziness and giddiness: Secondary | ICD-10-CM

## 2023-06-03 NOTE — Telephone Encounter (Signed)
Outgoing call placed to patients daughter to find out who the company is providing PT. Per Victorino Dike the company is [facility] in [place], phone number [phone]. Victorino Dike also stressed the need for a Child psychotherapist to be added to referral order.   PT Vestibular rehab order canceled   Side note: any additional follow-up outside of the referral will need to be addressed by clinical intake medical assistant Isidoro Donning (today)

## 2023-06-03 NOTE — Telephone Encounter (Signed)
Okay to add on vestibular rehab and SW to current home health orders, please call agency to notify of new orders.

## 2023-06-03 NOTE — Telephone Encounter (Signed)
Per office order needs to be entered as a referral, they don't work off orders.    PT vestibular rehab    Please advise   Brenda Mcfarland

## 2023-06-07 DIAGNOSIS — M25552 Pain in left hip: Secondary | ICD-10-CM | POA: Diagnosis not present

## 2023-06-07 MED ORDER — AMLODIPINE BESYLATE 5 MG PO TABS: 5.0000 mg | ORAL_TABLET | Freq: Every day | ORAL | 1 refills | Status: DC

## 2023-06-07 NOTE — Telephone Encounter (Signed)
It will not allow me to close the encounter saying unsigned orders  Please advise

## 2023-06-15 DIAGNOSIS — G4733 Obstructive sleep apnea (adult) (pediatric): Secondary | ICD-10-CM | POA: Diagnosis not present

## 2023-06-26 ENCOUNTER — Encounter: Payer: Self-pay | Admitting: Nurse Practitioner

## 2023-07-01 ENCOUNTER — Encounter: Payer: Self-pay | Admitting: Nurse Practitioner

## 2023-07-01 ENCOUNTER — Telehealth: Payer: Medicare HMO | Admitting: Nurse Practitioner

## 2023-07-01 DIAGNOSIS — N3281 Overactive bladder: Secondary | ICD-10-CM

## 2023-07-01 DIAGNOSIS — M48061 Spinal stenosis, lumbar region without neurogenic claudication: Secondary | ICD-10-CM

## 2023-07-01 DIAGNOSIS — G4700 Insomnia, unspecified: Secondary | ICD-10-CM | POA: Diagnosis not present

## 2023-07-01 MED ORDER — SOLIFENACIN SUCCINATE 5 MG PO TABS
5.0000 mg | ORAL_TABLET | Freq: Every day | ORAL | 0 refills | Status: DC
Start: 2023-07-01 — End: 2023-09-17

## 2023-07-01 MED ORDER — VIBEGRON 75 MG PO TABS
75.0000 mg | ORAL_TABLET | Freq: Every day | ORAL | 1 refills | Status: DC
Start: 2023-07-01 — End: 2023-07-01

## 2023-07-01 MED ORDER — DULOXETINE HCL 20 MG PO CPEP
20.0000 mg | ORAL_CAPSULE | Freq: Every day | ORAL | 0 refills | Status: DC
Start: 2023-07-01 — End: 2023-08-23

## 2023-07-01 NOTE — Progress Notes (Signed)
This service is provided via telemedicine  No vital signs collected/recorded due to the encounter was a telemedicine visit.   Location of patient (ex: home, work):  Home  Patient consents to a telephone visit:  Yes  Location of the provider (ex: office, home):  Office [place].   Name of any referring provider:  na  Names of all persons participating in the telemedicine service and their role in the encounter:  Brenda Mcfarland, Patient, Nelda Severe, CMA, Abbey Chatters, NP  Time spent on call:  7:11

## 2023-07-01 NOTE — Progress Notes (Signed)
Careteam: Patient Care Team: [name], NP as PCP - General (Geriatric Medicine) Thomasene Ripple, DO as PCP - Cardiology (Cardiology) Adelfa Koh, MD as Referring Physician (Orthopedic Surgery) Georgeanna Lea, MD as Consulting Physician (Cardiology)  Advanced Directive information    Allergies  Allergen Reactions   Amlodipine Swelling   Aripiprazole Other (See Comments)    Tongue twisted, lips curled up Tongue twisted, lips curled up    Carvedilol Other (See Comments)    Nightmares Nightmares    Hydralazine     Diarrhea, abdominal pain   Irbesartan     dizziness   Meclizine Other (See Comments)    Dizziness    Paroxetine Hcl Other (See Comments)    . Marland Kitchen    Tizanidine Other (See Comments)    Syncope Syncope    Tramadol Other (See Comments)    Unknown   Zolpidem Other (See Comments)    Sleepwalking Sleepwalking    Zyrtec [Cetirizine Hcl]     Dizziness    Chief Complaint  Patient presents with   Acute Visit    Requesting Medication to Help Sleep. No relief with Melatonin 10mg      HPI: Patient is a 77 y.o. female  Reports ongoing back pain with difficulty sleeping at baseline. has a hard time sleeping. Reports she needs something to help her.  In the past she has had something to help her sleep but it has not worked well.  She reports pain is not controlled at bedtime. Takes hydrocodone-apap to help her sleep.  Then she will come out to her chair and try to sleep there but not good sleep.  Laying down makes the pain worse.   She has had nerve block and wants her to have an ablation.  She tried cyclobenzaprine and it made her legs jump and was very disruptive.  Once she stopped it helped.   Also will get to sleep and then have to go to the bathroom.   Wants a pill to "knock me out"  Reports dizziness has been controlled  Review of Systems:  Review of Systems  Constitutional:  Negative for chills, fever and weight loss.   HENT:  Negative for tinnitus.   Respiratory:  Negative for cough, sputum production and shortness of breath.   Cardiovascular:  Negative for chest pain, palpitations and leg swelling.  Gastrointestinal:  Negative for abdominal pain, constipation, diarrhea and heartburn.  Genitourinary:  Negative for dysuria, frequency and urgency.  Musculoskeletal:  Positive for back pain. Negative for falls, joint pain and myalgias.  Skin: Negative.   Neurological:  Positive for dizziness. Negative for headaches.  Psychiatric/Behavioral:  Positive for depression. Negative for memory loss. The patient has insomnia.     Past Medical History:  Diagnosis Date   Abnormal EKG 10/23/2016   Arthritis 01/18/2020   Chronic fatigue 10/08/2015   Depression 01/18/2020   Diabetes mellitus (HCC) 10/08/2015   Formatting of this note might be different from the original. Overview:  last Hgb A1C 7.2/no blood sugars at home Formatting of this note might be different from the original. last Hgb A1C 7.2/no blood sugars at home   ESR raised 11/27/2016   Frequent urination 12/16/2018   Glaucoma 10/08/2015   High cholesterol    History of pancreatitis 07/11/2014   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/23/2016   Hypertension 10/08/2015   IFG (impaired fasting glucose) 10/24/2019   Incomplete emptying of bladder 11/30/2017   Knee pain, left 12/22/2016   Loss of memory 01/18/2020  Major depressive disorder in partial remission (HCC) 10/08/2015   Morbid obesity (HCC) 10/23/2016   Obesity (BMI 30-39.9) 11/10/2016   Obstructive sleep apnea 12/16/2018   Osteoarthritis of both knees 11/27/2016   Other insomnia 03/05/2017   Palpitations 10/23/2016   Positive ANA (antinuclear antibody) 10/23/2016   Sleep apnea 10/08/2015   Formatting of this note might be different from the original. Overview:  does not use CPAP Formatting of this note might be different from the original. does not use CPAP   Sore neck 10/24/2019   Spondylosis of cervical spine with myelopathy  01/18/2020   Type 2 diabetes mellitus without complication, without long-term current use of insulin (HCC) 12/16/2018   Vitamin D deficiency 10/08/2015   Past Surgical History:  Procedure Laterality Date   APPENDECTOMY     age 51   CESAREAN SECTION     X 3   NECK SURGERY  2015   SPINE SURGERY  05/29/2015   Social History:   reports that she quit smoking about 51 years ago. Her smoking use included cigarettes. She started smoking about 60 years ago. She has a 9 pack-year smoking history. She has never used smokeless tobacco. She reports that she does not currently use alcohol. She reports that she does not currently use drugs.  Family History  Problem Relation Age of Onset   Diabetes Mother    Multiple sclerosis Mother    Heart disease Father    Heart Problems Father    Migraines Daughter    Thyroid disease Daughter    Migraines Daughter     Medications: Patient's Medications  New Prescriptions   No medications on file  Previous Medications   ACETAMINOPHEN (TYLENOL) 500 MG TABLET    Take 1,000 mg by mouth 2 (two) times daily as needed.   AMLODIPINE (NORVASC) 5 MG TABLET    Take 1 tablet (5 mg total) by mouth daily.   HYDROCODONE-ACETAMINOPHEN (NORCO) 10-325 MG TABLET    Take 1 tablet by mouth every 6 (six) hours as needed.   IBUPROFEN 200 MG CAPS    Take by mouth as needed.   LATANOPROST (XALATAN) 0.005 % OPHTHALMIC SOLUTION    Place 1 drop into both eyes at bedtime.   LOSARTAN (COZAAR) 50 MG TABLET    Take 1 tablet (50 mg total) by mouth daily.   MAGNESIUM GLYCINATE PO    Take 350 mg by mouth.   MELATONIN 5 MG TABS    Take by mouth at bedtime. 1-2 tablets   OVER THE COUNTER MEDICATION    daily. Essential Oils   POLYETHYLENE GLYCOL (MIRALAX / GLYCOLAX) 17 G PACKET    Take 17 g by mouth as needed.  Modified Medications   No medications on file  Discontinued Medications   No medications on file    Physical Exam:  There were no vitals filed for this visit. There is no  height or weight on file to calculate BMI. Wt Readings from Last 3 Encounters:  05/21/23 229 lb (103.9 kg)  03/25/23 243 lb (110.2 kg)  03/03/23 243 lb (110.2 kg)    Physical Exam Constitutional:      Appearance: Normal appearance.  Pulmonary:     Effort: Pulmonary effort is normal.  Neurological:     Mental Status: She is alert. Mental status is at baseline.  Psychiatric:        Mood and Affect: Mood normal.     Labs reviewed: Basic Metabolic Panel: Recent Labs    02/23/23 1212  NA 139  K 4.2  CL 106  CO2 25  GLUCOSE 123*  BUN 24*  CREATININE 0.74  CALCIUM 9.2   Liver Function Tests: Recent Labs    02/23/23 1212  AST 13  ALT 16  ALKPHOS 60  BILITOT 0.6  PROT 6.5  ALBUMIN 4.1   No results for input(s): "LIPASE", "AMYLASE" in the last 8760 hours. No results for input(s): "AMMONIA" in the last 8760 hours. CBC: Recent Labs    02/23/23 1212  WBC 7.3  NEUTROABS 4.7  HGB 13.8  HCT 41.3  MCV 94.8  PLT 256.0   Lipid Panel: No results for input(s): "CHOL", "HDL", "LDLCALC", "TRIG", "CHOLHDL", "LDLDIRECT" in the last 8760 hours. TSH: No results for input(s): "TSH" in the last 8760 hours. A1C: Lab Results  Component Value Date   HGBA1C 7.0 (H) 02/23/2023     Assessment/Plan 1. Spinal stenosis of lumbar region, unspecified whether neurogenic claudication present Continues to take hydrocodone-apap and work with therapy -will add cymbalta to help with pain can increase to 2 tablets after 1 week if without side effects.   2. Insomnia, unspecified type Recommend to try to correct source which is causing sleep disturbances.  Add cymbalta to help with pain and will help mood which can improve sleep.  -will start medication to help with OAB -in the past Remus Loffler has caused sleep walking so do not wish to start tahat gain -consider belsomra for sleep if other causes controlled and still having issue- however cost may be an issue  3. Overactive  bladder Myrbetriq did not work in the past, sent in Rx for gemtesa but too expensive -vesicare 5 mg daily Rx   Next appt: 65month virtual.  Janene Harvey. Biagio Borg  [facility] [phone]    Virtual Visit via video  I connected with patient on 07/01/23 at 10:40 AM EDT by mychart and verified that I am speaking with the correct person using two identifiers.  Location: Patient: home Provider: [facility]   I discussed the limitations, risks, security and privacy concerns of performing an evaluation and management service by telephone and the availability of in person appointments. I also discussed with the patient that there may be a patient responsible charge related to this service. The patient expressed understanding and agreed to proceed.   I discussed the assessment and treatment plan with the patient. The patient was provided an opportunity to ask questions and all were answered. The patient agreed with the plan and demonstrated an understanding of the instructions.   The patient was advised to call back or seek an in-person evaluation if the symptoms worsen or if the condition fails to improve as anticipated.  I provided 25 minutes of non-face-to-face time during this encounter.  Janene Harvey. Biagio Borg Avs printed and mailed

## 2023-07-01 NOTE — Patient Instructions (Signed)
Start cymbalta 20 mg daily- can increase after 1 week to 2 tablets if no side effects but not having good pain relief  To start Vibegron (gemtesa) after you have been on cymbalta  This is for your overactive bladder

## 2023-07-12 ENCOUNTER — Telehealth: Payer: Self-pay

## 2023-07-12 NOTE — Telephone Encounter (Signed)
Patient daughter Victorino Dike called and states that patient has been very shaky, weak, nauseas and unable to do things normally. She states that patient urinated on herself earlier today. Patient daughter wants to know if you know an urgent care who does Fluids or if she needs to go to ER?

## 2023-07-12 NOTE — Telephone Encounter (Signed)
I spoke to Abbey Chatters, NP while she was between patients and had some free time. She states that she should take patient to [facility] in [place]. Wait time isnt very long.

## 2023-07-27 ENCOUNTER — Telehealth: Payer: Medicare HMO | Admitting: Nurse Practitioner

## 2023-08-07 ENCOUNTER — Encounter: Payer: Self-pay | Admitting: Nurse Practitioner

## 2023-08-16 DIAGNOSIS — M47816 Spondylosis without myelopathy or radiculopathy, lumbar region: Secondary | ICD-10-CM | POA: Diagnosis not present

## 2023-08-16 DIAGNOSIS — M51369 Other intervertebral disc degeneration, lumbar region without mention of lumbar back pain or lower extremity pain: Secondary | ICD-10-CM | POA: Diagnosis not present

## 2023-08-16 DIAGNOSIS — Z6836 Body mass index (BMI) 36.0-36.9, adult: Secondary | ICD-10-CM | POA: Diagnosis not present

## 2023-08-16 DIAGNOSIS — M47896 Other spondylosis, lumbar region: Secondary | ICD-10-CM | POA: Diagnosis not present

## 2023-08-16 DIAGNOSIS — M4807 Spinal stenosis, lumbosacral region: Secondary | ICD-10-CM | POA: Diagnosis not present

## 2023-08-16 HISTORY — PX: OTHER SURGICAL HISTORY: SHX169

## 2023-08-18 ENCOUNTER — Other Ambulatory Visit: Payer: Self-pay

## 2023-08-18 DIAGNOSIS — I10 Essential (primary) hypertension: Secondary | ICD-10-CM

## 2023-08-18 NOTE — Telephone Encounter (Signed)
Patient refill request for medication pended below but pops up with a allergy to  IRBESARTAN . Is the refill appropriate

## 2023-08-19 MED ORDER — LOSARTAN POTASSIUM 50 MG PO TABS
50.0000 mg | ORAL_TABLET | Freq: Every day | ORAL | 1 refills | Status: DC
Start: 2023-08-19 — End: 2023-11-15

## 2023-08-20 ENCOUNTER — Encounter: Payer: Medicare HMO | Admitting: Nurse Practitioner

## 2023-08-20 ENCOUNTER — Encounter: Payer: Self-pay | Admitting: Nurse Practitioner

## 2023-08-20 NOTE — Progress Notes (Signed)
This encounter was created in error - please disregard.  Due to not being able to connect virtually- offered to reschedule by staff but pt declined at this time

## 2023-08-20 NOTE — Progress Notes (Signed)
   This service is provided via telemedicine  No vital signs collected/recorded due to the encounter was a telemedicine visit.   Location of patient (ex: home, work):  Home  Patient consents to a telephone visit: Yes  Location of the provider (ex: office, home):  [facility], Office   Name of any referring provider:  N/A  Names of all persons participating in the telemedicine service and their role in the encounter:  S.Chrae B/CMA, Abbey Chatters, NP, and Patient   Time spent on call:  9 min with medical assistant

## 2023-08-22 ENCOUNTER — Other Ambulatory Visit: Payer: Self-pay | Admitting: Nurse Practitioner

## 2023-08-22 DIAGNOSIS — I10 Essential (primary) hypertension: Secondary | ICD-10-CM

## 2023-08-23 ENCOUNTER — Other Ambulatory Visit: Payer: Self-pay | Admitting: *Deleted

## 2023-08-23 DIAGNOSIS — M48061 Spinal stenosis, lumbar region without neurogenic claudication: Secondary | ICD-10-CM

## 2023-08-23 MED ORDER — DULOXETINE HCL 20 MG PO CPEP
20.0000 mg | ORAL_CAPSULE | Freq: Every day | ORAL | 5 refills | Status: DC
Start: 2023-08-23 — End: 2023-09-17

## 2023-08-23 NOTE — Telephone Encounter (Signed)
Pharmacy requested refill

## 2023-08-30 DIAGNOSIS — M47816 Spondylosis without myelopathy or radiculopathy, lumbar region: Secondary | ICD-10-CM | POA: Diagnosis not present

## 2023-09-16 ENCOUNTER — Telehealth: Payer: Self-pay

## 2023-09-16 NOTE — Telephone Encounter (Signed)
Spoke with patient's daughter called to speak [name], NP to ask if she can give a call the patient daughter because patient is not doing well with medication and also wanted to see if we can do another referral for PT because of new insurance. Please Advise  Message sent to [name], NP

## 2023-09-16 NOTE — Telephone Encounter (Signed)
Have daughter set up virtual visit with her mother so we can discuss and order PT

## 2023-09-17 ENCOUNTER — Telehealth (INDEPENDENT_AMBULATORY_CARE_PROVIDER_SITE_OTHER): Payer: Medicare PPO | Admitting: Nurse Practitioner

## 2023-09-17 ENCOUNTER — Encounter: Payer: Self-pay | Admitting: Nurse Practitioner

## 2023-09-17 DIAGNOSIS — F419 Anxiety disorder, unspecified: Secondary | ICD-10-CM

## 2023-09-17 DIAGNOSIS — M48061 Spinal stenosis, lumbar region without neurogenic claudication: Secondary | ICD-10-CM | POA: Diagnosis not present

## 2023-09-17 DIAGNOSIS — R413 Other amnesia: Secondary | ICD-10-CM

## 2023-09-17 NOTE — Progress Notes (Unsigned)
Careteam: Patient Care Team: [name], NP as PCP - General (Geriatric Medicine) Thomasene Ripple, DO as PCP - Cardiology (Cardiology) Adelfa Koh, MD as Referring Physician (Orthopedic Surgery) Georgeanna Lea, MD as Consulting Physician (Cardiology)  Advanced Directive information Does Patient Have a Medical Advance Directive?: Yes, Type of Advance Directive: Healthcare Power of [place];Living will, Does patient want to make changes to medical advance directive?: No - Patient declined  Allergies  Allergen Reactions   Amlodipine Swelling   Aripiprazole Other (See Comments)    Tongue twisted, lips curled up Tongue twisted, lips curled up    Carvedilol Other (See Comments)    Nightmares Nightmares    Hydralazine     Diarrhea, abdominal pain   Irbesartan     dizziness   Meclizine Other (See Comments)    Dizziness    Paroxetine Hcl Other (See Comments)    . Marland Kitchen    Tizanidine Other (See Comments)    Syncope Syncope    Tramadol Other (See Comments)    Unknown   Zolpidem Other (See Comments)    Sleepwalking Sleepwalking    Zyrtec [Cetirizine Hcl]     Dizziness    Chief Complaint  Patient presents with   Acute Visit    Patient complains of pain in back and hip on left side. Level of pain is 8 out of 10. Patient wants PT.     HPI: Patient is a 78 y.o. female for video visit.  She is having severe back and hip pain. Reports it is over the top bad today.  She has had 2 spinal injection and to be a candidate for the ablation which will take place next week.  She is on hydrocodone 10/325 mg and can take every 6 months.  She is using a Scientist, clinical (histocompatibility and immunogenetics) that helps.  She has had Lyla Son a PT and would like to continue with her.  She was getting better with her and benefiting She is attempting to do the exercising on her own but continues to get weak and has increase pain.    She is seeing pain management at Dr Mosetta Pigeon office Increase in confusion  and anxiety Having more anxiety because she can not run the household like she wants.  She will put things in different places and then not be able to find them, then gets agitated.  Takes miralax which helps constipation.   Daughter feels like her memory issues are getting worse- would like neurology consult but pt wasn't to wait.  Review of Systems:  Review of Systems  Constitutional:  Negative for chills, fever and weight loss.  HENT:  Negative for tinnitus.   Respiratory:  Negative for cough, sputum production and shortness of breath.   Cardiovascular:  Negative for chest pain, palpitations and leg swelling.  Gastrointestinal:  Negative for abdominal pain, constipation, diarrhea and heartburn.  Genitourinary:  Negative for dysuria, frequency and urgency.  Musculoskeletal:  Positive for back pain and myalgias. Negative for falls and joint pain.  Skin: Negative.   Neurological:  Positive for weakness. Negative for dizziness and headaches.  Psychiatric/Behavioral:  Negative for depression and memory loss. The patient is nervous/anxious. The patient does not have insomnia.     Past Medical History:  Diagnosis Date   Abnormal EKG 10/23/2016   Arthritis 01/18/2020   Chronic fatigue 10/08/2015   Depression 01/18/2020   Diabetes mellitus (HCC) 10/08/2015   Formatting of this note might be different from the original. Overview:  last Hgb  A1C 7.2/no blood sugars at home Formatting of this note might be different from the original. last Hgb A1C 7.2/no blood sugars at home   ESR raised 11/27/2016   Frequent urination 12/16/2018   Glaucoma 10/08/2015   High cholesterol    History of pancreatitis 07/11/2014   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/23/2016   Hypertension 10/08/2015   IFG (impaired fasting glucose) 10/24/2019   Incomplete emptying of bladder 11/30/2017   Knee pain, left 12/22/2016   Loss of memory 01/18/2020   Major depressive disorder in partial remission (HCC) 10/08/2015   Morbid obesity (HCC) 10/23/2016    Obesity (BMI 30-39.9) 11/10/2016   Obstructive sleep apnea 12/16/2018   Osteoarthritis of both knees 11/27/2016   Other insomnia 03/05/2017   Palpitations 10/23/2016   Positive ANA (antinuclear antibody) 10/23/2016   Sleep apnea 10/08/2015   Formatting of this note might be different from the original. Overview:  does not use CPAP Formatting of this note might be different from the original. does not use CPAP   Sore neck 10/24/2019   Spondylosis of cervical spine with myelopathy 01/18/2020   Type 2 diabetes mellitus without complication, without long-term current use of insulin (HCC) 12/16/2018   Vitamin D deficiency 10/08/2015   Past Surgical History:  Procedure Laterality Date   APPENDECTOMY     age 49   CESAREAN SECTION     X 3   NECK SURGERY  2015   OTHER SURGICAL HISTORY  08/16/2023   Spinal injection   SPINE SURGERY  05/29/2015   Social History:   reports that she quit smoking about 52 years ago. Her smoking use included cigarettes. She started smoking about 61 years ago. She has a 9 pack-year smoking history. She has never used smokeless tobacco. She reports that she does not currently use alcohol. She reports that she does not currently use drugs.  Family History  Problem Relation Age of Onset   Diabetes Mother    Multiple sclerosis Mother    Heart disease Father    Heart Problems Father    Migraines Daughter    Thyroid disease Daughter    Migraines Daughter     Medications: Patient's Medications  New Prescriptions   No medications on file  Previous Medications   ACETAMINOPHEN (TYLENOL) 500 MG TABLET    Take 1,000 mg by mouth 2 (two) times daily as needed.   AMLODIPINE (NORVASC) 5 MG TABLET    Take 1 tablet (5 mg total) by mouth daily.   HYDROCODONE-ACETAMINOPHEN (NORCO) 10-325 MG TABLET    Take 1 tablet by mouth every 6 (six) hours as needed.   IBUPROFEN 200 MG CAPS    Take by mouth as needed.   LATANOPROST (XALATAN) 0.005 % OPHTHALMIC SOLUTION    Place 1 drop into both eyes  at bedtime.   LOSARTAN (COZAAR) 50 MG TABLET    Take 1 tablet (50 mg total) by mouth daily.   MAGNESIUM GLYCINATE PO    Take 350 mg by mouth.   MELATONIN 5 MG TABS    Take by mouth at bedtime. 1-2 tablets   OVER THE COUNTER MEDICATION    daily. Essential Oils as needed   POLYETHYLENE GLYCOL (MIRALAX / GLYCOLAX) 17 G PACKET    Take 17 g by mouth as needed.  Modified Medications   No medications on file  Discontinued Medications   DULOXETINE (CYMBALTA) 20 MG CAPSULE    Take 1 capsule (20 mg total) by mouth daily.   SOLIFENACIN (VESICARE) 5 MG TABLET  Take 1 tablet (5 mg total) by mouth daily.    Physical Exam:  There were no vitals filed for this visit. There is no height or weight on file to calculate BMI. Wt Readings from Last 3 Encounters:  05/21/23 229 lb (103.9 kg)  03/25/23 243 lb (110.2 kg)  03/03/23 243 lb (110.2 kg)    Physical Exam***  Labs reviewed: Basic Metabolic Panel: Recent Labs    02/23/23 1212  NA 139  K 4.2  CL 106  CO2 25  GLUCOSE 123*  BUN 24*  CREATININE 0.74  CALCIUM 9.2   Liver Function Tests: Recent Labs    02/23/23 1212  AST 13  ALT 16  ALKPHOS 60  BILITOT 0.6  PROT 6.5  ALBUMIN 4.1   No results for input(s): "LIPASE", "AMYLASE" in the last 8760 hours. No results for input(s): "AMMONIA" in the last 8760 hours. CBC: Recent Labs    02/23/23 1212  WBC 7.3  NEUTROABS 4.7  HGB 13.8  HCT 41.3  MCV 94.8  PLT 256.0   Lipid Panel: No results for input(s): "CHOL", "HDL", "LDLCALC", "TRIG", "CHOLHDL", "LDLDIRECT" in the last 8760 hours. TSH: No results for input(s): "TSH" in the last 8760 hours. A1C: Lab Results  Component Value Date   HGBA1C 7.0 (H) 02/23/2023     Assessment/Plan 1. Spinal stenosis of lumbar region, unspecified whether neurogenic claudication present (Primary) *** - Ambulatory referral to Home Health  2. Anxiety ***  3. Loss of memory ***   Next appt: *** Astaria Nanez K. Biagio Borg  [facility] [phone]    Virtual Visit via video  I connected with patient on 09/17/23 at  2:00 PM EST by mychart and verified that I am speaking with the correct person using two identifiers.  Location: Patient: *** Provider: ***   I discussed the limitations, risks, security and privacy concerns of performing an evaluation and management service by telephone and the availability of in person appointments. I also discussed with the patient that there may be a patient responsible charge related to this service. The patient expressed understanding and agreed to proceed.   I discussed the assessment and treatment plan with the patient. The patient was provided an opportunity to ask questions and all were answered. The patient agreed with the plan and demonstrated an understanding of the instructions.   The patient was advised to call back or seek an in-person evaluation if the symptoms worsen or if the condition fails to improve as anticipated.  I provided *** minutes of non-face-to-face time during this encounter.  Janene Harvey. Biagio Borg Avs printed and mailed

## 2023-09-17 NOTE — Progress Notes (Unsigned)
   This service is provided via telemedicine  No vital signs collected/recorded due to the encounter was a telemedicine visit.   Location of patient (ex: home, work):  Home  Patient consents to a telephone visit:  Yes  Location of the provider (ex: office, home):  [employer].  Name of any referring provider:  [name], NP   Names of all persons participating in the telemedicine service and their role in the encounter:  Patient, Daughter Victorino Dike, Meda Klinefelter, RMA, Abbey Chatters, NP.    Time spent on call:  8 minutes spent on the phone with Medical Assistant.

## 2023-09-17 NOTE — Telephone Encounter (Signed)
Patient's daughter made an appointment on yesterday per [name], NP instructions.

## 2023-09-27 ENCOUNTER — Telehealth: Payer: Self-pay | Admitting: *Deleted

## 2023-09-27 ENCOUNTER — Telehealth: Payer: Medicare PPO | Admitting: Nurse Practitioner

## 2023-09-27 NOTE — Telephone Encounter (Signed)
Brenda Mcfarland with [facility] called requesting verbal orders for PT 1X9weeks and for a 1x1week Social Eval.   Verbal orders given.

## 2023-10-08 ENCOUNTER — Other Ambulatory Visit: Payer: Self-pay | Admitting: Adult Health

## 2023-10-08 DIAGNOSIS — I10 Essential (primary) hypertension: Secondary | ICD-10-CM

## 2023-10-08 DIAGNOSIS — E119 Type 2 diabetes mellitus without complications: Secondary | ICD-10-CM

## 2023-10-12 ENCOUNTER — Other Ambulatory Visit: Payer: Self-pay

## 2023-10-12 MED ORDER — AMLODIPINE BESYLATE 5 MG PO TABS: 5.0000 mg | ORAL_TABLET | Freq: Every day | ORAL | 3 refills | Status: AC

## 2023-10-12 NOTE — Telephone Encounter (Signed)
High risk or very high risk warning populated when attempting to refill medication. RX request sent to PCP for review and approval if warranted.

## 2023-10-25 ENCOUNTER — Telehealth: Payer: Self-pay | Admitting: Emergency Medicine

## 2023-10-25 NOTE — Telephone Encounter (Signed)
 Patients daughter would like to know steps to get an referral for home health care. Patient will call back after discussing with mom and then she will set up an appointment

## 2023-11-01 DIAGNOSIS — M47816 Spondylosis without myelopathy or radiculopathy, lumbar region: Secondary | ICD-10-CM | POA: Diagnosis not present

## 2023-11-13 ENCOUNTER — Other Ambulatory Visit: Payer: Self-pay | Admitting: Nurse Practitioner

## 2023-11-13 DIAGNOSIS — I10 Essential (primary) hypertension: Secondary | ICD-10-CM

## 2023-11-15 NOTE — Telephone Encounter (Signed)
Pharmacy requested refill. Pended Rx and sent to Jessica for approval due to HIGH ALERT Warning.  

## 2023-11-30 ENCOUNTER — Encounter: Payer: Self-pay | Admitting: Nurse Practitioner

## 2023-12-07 ENCOUNTER — Telehealth (INDEPENDENT_AMBULATORY_CARE_PROVIDER_SITE_OTHER): Admitting: Nurse Practitioner

## 2023-12-07 ENCOUNTER — Encounter: Payer: Self-pay | Admitting: Nurse Practitioner

## 2023-12-07 ENCOUNTER — Telehealth: Payer: Self-pay | Admitting: *Deleted

## 2023-12-07 DIAGNOSIS — R251 Tremor, unspecified: Secondary | ICD-10-CM | POA: Diagnosis not present

## 2023-12-07 DIAGNOSIS — I10 Essential (primary) hypertension: Secondary | ICD-10-CM

## 2023-12-07 DIAGNOSIS — M25552 Pain in left hip: Secondary | ICD-10-CM | POA: Diagnosis not present

## 2023-12-07 DIAGNOSIS — M48061 Spinal stenosis, lumbar region without neurogenic claudication: Secondary | ICD-10-CM

## 2023-12-07 NOTE — Progress Notes (Signed)
 Careteam: Patient Care Team: [name], NP as PCP - General (Geriatric Medicine) Thomasene Ripple, DO as PCP - Cardiology (Cardiology) Adelfa Koh, MD as Referring Physician (Orthopedic Surgery) Georgeanna Lea, MD as Consulting Physician (Cardiology)  Advanced Directive information    Allergies  Allergen Reactions   Amlodipine Swelling   Aripiprazole Other (See Comments)    Tongue twisted, lips curled up Tongue twisted, lips curled up    Carvedilol Other (See Comments)    Nightmares Nightmares    Hydralazine     Diarrhea, abdominal pain   Irbesartan     dizziness   Meclizine Other (See Comments)    Dizziness    Paroxetine Hcl Other (See Comments)    . Marland Kitchen    Tizanidine Other (See Comments)    Syncope Syncope    Tramadol Other (See Comments)    Unknown   Zolpidem Other (See Comments)    Sleepwalking Sleepwalking    Zyrtec [Cetirizine Hcl]     Dizziness    Chief Complaint  Patient presents with   Order Request    Requesting Referral for OT/PT For Hip and Back Pain.     Discussed the use of AI scribe software for clinical note transcription with the patient, who gave verbal consent to proceed.  History of Present Illness   The patient is a 78 year old with spinal stenosis who presents for a new order for physical and occupational therapy. She is accompanied by her daughter, Victorino Dike.  She is experiencing significant hip and back pain due to spinal stenosis, which impairs her mobility and ability to leave the house. She previously benefited from physical therapy at home, but it was discontinued due to insurance issues. She wishes to continue with the same therapist, Lyla Son, from [facility].  Her left hip pain radiates to the groin, causing severe discomfort.   She has undergone a previous ablation, which was not very effective, and is planning another one.  She is currently taking hydrocodone-acetaminophen 10/325 mg  more frequently than every six hours due to pain severity. She has prescriptions for cyclobenzaprine and pregabalin but is hesitant to use them due to potential interactions with her blood pressure medications and previous experiences of dizziness and lack of efficacy so stopped medication.   She experiences episodes of severe shaking and tremors, which she associates with anxiety and stress. These episodes are not constant but can affect her whole body.   Her blood pressure has been well-controlled, and she has lost weight recently, which she believes has contributed to this control.      Review of Systems:  Review of Systems  Constitutional:  Positive for weight loss. Negative for chills and fever.  HENT:  Negative for tinnitus.   Respiratory:  Negative for cough, sputum production and shortness of breath.   Cardiovascular:  Negative for chest pain, palpitations and leg swelling.  Gastrointestinal:  Negative for abdominal pain, constipation, diarrhea and heartburn.  Genitourinary:  Negative for dysuria, frequency and urgency.  Musculoskeletal:  Positive for back pain, joint pain and myalgias. Negative for falls.  Skin: Negative.   Neurological:  Positive for tremors and weakness. Negative for dizziness and headaches.  Psychiatric/Behavioral:  Negative for depression and memory loss. The patient does not have insomnia.     Past Medical History:  Diagnosis Date   Abnormal EKG 10/23/2016   Arthritis 01/18/2020   Chronic fatigue 10/08/2015   Depression 01/18/2020   Diabetes mellitus (HCC) 10/08/2015  Formatting of this note might be different from the original. Overview:  last Hgb A1C 7.2/no blood sugars at home Formatting of this note might be different from the original. last Hgb A1C 7.2/no blood sugars at home   ESR raised 11/27/2016   Frequent urination 12/16/2018   Glaucoma 10/08/2015   High cholesterol    History of pancreatitis 07/11/2014   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/23/2016   Hypertension  10/08/2015   IFG (impaired fasting glucose) 10/24/2019   Incomplete emptying of bladder 11/30/2017   Knee pain, left 12/22/2016   Loss of memory 01/18/2020   Major depressive disorder in partial remission (HCC) 10/08/2015   Morbid obesity (HCC) 10/23/2016   Obesity (BMI 30-39.9) 11/10/2016   Obstructive sleep apnea 12/16/2018   Osteoarthritis of both knees 11/27/2016   Other insomnia 03/05/2017   Palpitations 10/23/2016   Positive ANA (antinuclear antibody) 10/23/2016   Sleep apnea 10/08/2015   Formatting of this note might be different from the original. Overview:  does not use CPAP Formatting of this note might be different from the original. does not use CPAP   Sore neck 10/24/2019   Spondylosis of cervical spine with myelopathy 01/18/2020   Type 2 diabetes mellitus without complication, without long-term current use of insulin (HCC) 12/16/2018   Vitamin D deficiency 10/08/2015   Past Surgical History:  Procedure Laterality Date   APPENDECTOMY     age 18   CESAREAN SECTION     X 3   NECK SURGERY  2015   OTHER SURGICAL HISTORY  08/16/2023   Spinal injection   SPINE SURGERY  05/29/2015   Social History:   reports that she quit smoking about 52 years ago. Her smoking use included cigarettes. She started smoking about 61 years ago. She has a 9 pack-year smoking history. She has never used smokeless tobacco. She reports that she does not currently use alcohol. She reports that she does not currently use drugs.  Family History  Problem Relation Age of Onset   Diabetes Mother    Multiple sclerosis Mother    Heart disease Father    Heart Problems Father    Migraines Daughter    Thyroid disease Daughter    Migraines Daughter     Medications: Patient's Medications  New Prescriptions   No medications on file  Previous Medications   ACETAMINOPHEN (TYLENOL) 500 MG TABLET    Take 1,000 mg by mouth 2 (two) times daily as needed.   AMLODIPINE (NORVASC) 5 MG TABLET    Take 1 tablet (5 mg total) by mouth  daily.   CYCLOBENZAPRINE (FLEXERIL) 10 MG TABLET    Take 10 mg by mouth 3 (three) times daily as needed.   HYDROCODONE-ACETAMINOPHEN (NORCO) 10-325 MG TABLET    Take 1 tablet by mouth every 6 (six) hours as needed.   IBUPROFEN 200 MG CAPS    Take by mouth as needed.   LATANOPROST (XALATAN) 0.005 % OPHTHALMIC SOLUTION    Place 1 drop into both eyes at bedtime.   LOSARTAN (COZAAR) 50 MG TABLET    TAKE ONE TABLET BY MOUTH ONE TIME DAILY   MAGNESIUM GLYCINATE PO    Take 350 mg by mouth.   MELATONIN 5 MG TABS    Take by mouth at bedtime. 1-2 tablets   OVER THE COUNTER MEDICATION    daily. Essential Oils as needed   POLYETHYLENE GLYCOL (MIRALAX / GLYCOLAX) 17 G PACKET    Take 17 g by mouth as needed.  Modified Medications  No medications on file  Discontinued Medications   No medications on file    Physical Exam:  There were no vitals filed for this visit. There is no height or weight on file to calculate BMI. Wt Readings from Last 3 Encounters:  05/21/23 229 lb (103.9 kg)  03/25/23 243 lb (110.2 kg)  03/03/23 243 lb (110.2 kg)    Physical Exam Constitutional:      Appearance: Normal appearance.  Pulmonary:     Effort: Pulmonary effort is normal.  Neurological:     Mental Status: She is alert. Mental status is at baseline.  Psychiatric:        Mood and Affect: Mood normal.     Labs reviewed: Basic Metabolic Panel: Recent Labs    02/23/23 1212  NA 139  K 4.2  CL 106  CO2 25  GLUCOSE 123*  BUN 24*  CREATININE 0.74  CALCIUM 9.2   Liver Function Tests: Recent Labs    02/23/23 1212  AST 13  ALT 16  ALKPHOS 60  BILITOT 0.6  PROT 6.5  ALBUMIN 4.1   No results for input(s): "LIPASE", "AMYLASE" in the last 8760 hours. No results for input(s): "AMMONIA" in the last 8760 hours. CBC: Recent Labs    02/23/23 1212  WBC 7.3  NEUTROABS 4.7  HGB 13.8  HCT 41.3  MCV 94.8  PLT 256.0   Lipid Panel: No results for input(s): "CHOL", "HDL", "LDLCALC", "TRIG",  "CHOLHDL", "LDLDIRECT" in the last 8760 hours. TSH: No results for input(s): "TSH" in the last 8760 hours. A1C: Lab Results  Component Value Date   HGBA1C 7.0 (H) 02/23/2023     Assessment/Plan Assessment and Plan    Spinal Stenosis of the Lumbar Spine Chronic lumbar spinal stenosis causing significant mobility issues and pain. Physical therapy improved mobilization and reduced pain. Previous ablation ineffective; another planned.  - Order physical therapy with Lyla Son at [facility] for hip and back pain. -continues hydrocodone-apap to take as prescribed by orthopedic.   Left Hip Pain Chronic left hip pain with groin radiation. MRI scheduled. Orthopedic specialist managing pain medication.s/p steroid injection in the groin. - Proceed with scheduled MRI of the left hip.  Tremors Episodes of severe shaking and tremors, possibly anxiety-related or benign essential tremors. Occupational therapy may help manage symptoms. - Discuss tremors with occupational therapist for potential interventions. - Plan for lab work to evaluate thyroid function and other potential causes.  Hypertension -Blood pressure well controlled, goal bp <140/90 Continue current medications and dietary modifications follow metabolic panel  Follow-up Scheduled follow-up appointments for ongoing management and evaluation. - Virtual annual wellness visit on April 29th, 2025 at 10:20 AM. - In-office follow-up on May 12th, 2025 at 1:40 PM.   Janene Harvey. Biagio Borg  [facility] [phone]    Virtual Visit via video  I connected with patient on 12/07/23 at  9:20 AM EDT by mychart and verified that I am speaking with the correct person using two identifiers.  Location: Patient: home Provider: [facility]   I discussed the limitations, risks, security and privacy concerns of performing an evaluation and management service by telephone and the availability of in  person appointments. I also discussed with the patient that there may be a patient responsible charge related to this service. The patient expressed understanding and agreed to proceed.   I discussed the assessment and treatment plan with the patient. The patient was provided an opportunity to ask questions and  all were answered. The patient agreed with the plan and demonstrated an understanding of the instructions.   The patient was advised to call back or seek an in-person evaluation if the symptoms worsen or if the condition fails to improve as anticipated.  I provided  of non-face-to-face time during this encounter.  Janene Harvey. Biagio Borg Avs printed and mailed

## 2023-12-07 NOTE — Telephone Encounter (Signed)
 [name], Brenda Mcfarland are scheduled for a virtual visit with your provider today.    Just as we do with appointments in the office, we must obtain your consent to participate.  Your consent will be active for this visit and any virtual visit you Brenda Mcfarland have with one of our providers in the next 365 days.    If you have a MyChart account, I can also send a copy of this consent to you electronically.  All virtual visits are billed to your insurance company just like a traditional visit in the office.  As this is a virtual visit, video technology does not allow for your provider to perform a traditional examination.  This Brenda Mcfarland limit your provider's ability to fully assess your condition.  If your provider identifies any concerns that need to be evaluated in person or the need to arrange testing such as labs, EKG, etc, we will make arrangements to do so.    Although advances in technology are sophisticated, we cannot ensure that it will always work on either your end or our end.  If the connection with a video visit is poor, we Brenda Mcfarland have to switch to a telephone visit.  With either a video or telephone visit, we are not always able to ensure that we have a secure connection.   I need to obtain your verbal consent now.   Are you willing to proceed with your visit today?   Brenda Mcfarland has provided verbal consent on 12/07/2023 for a virtual visit (video or telephone).   Brenda Mcfarland, [place] 12/07/2023  9:28 AM

## 2023-12-07 NOTE — Progress Notes (Signed)
 This service is provided via telemedicine  No vital signs collected/recorded due to the encounter was a telemedicine visit.   Location of patient (ex: home, work):  Home  Patient consents to a telephone visit:  Yes  Location of the provider (ex: office, home):  Office [facility].   Name of any referring provider:  na  Names of all persons participating in the telemedicine service and their role in the encounter:  Brenda Mcfarland, Patient, Cheryl Flash, Daughter, Synetta Fail Cheyane Ayon, CMA, Abbey Chatters, NP  Time spent on call:  7:10

## 2023-12-09 DIAGNOSIS — M170 Bilateral primary osteoarthritis of knee: Secondary | ICD-10-CM | POA: Diagnosis not present

## 2023-12-09 DIAGNOSIS — Z6839 Body mass index (BMI) 39.0-39.9, adult: Secondary | ICD-10-CM | POA: Diagnosis not present

## 2023-12-09 DIAGNOSIS — M48061 Spinal stenosis, lumbar region without neurogenic claudication: Secondary | ICD-10-CM | POA: Diagnosis not present

## 2023-12-09 DIAGNOSIS — E7800 Pure hypercholesterolemia, unspecified: Secondary | ICD-10-CM | POA: Diagnosis not present

## 2023-12-09 DIAGNOSIS — M4712 Other spondylosis with myelopathy, cervical region: Secondary | ICD-10-CM | POA: Diagnosis not present

## 2023-12-09 DIAGNOSIS — H409 Unspecified glaucoma: Secondary | ICD-10-CM | POA: Diagnosis not present

## 2023-12-09 DIAGNOSIS — G4709 Other insomnia: Secondary | ICD-10-CM | POA: Diagnosis not present

## 2023-12-09 DIAGNOSIS — F3341 Major depressive disorder, recurrent, in partial remission: Secondary | ICD-10-CM | POA: Diagnosis not present

## 2023-12-09 DIAGNOSIS — I10 Essential (primary) hypertension: Secondary | ICD-10-CM | POA: Diagnosis not present

## 2023-12-09 DIAGNOSIS — E119 Type 2 diabetes mellitus without complications: Secondary | ICD-10-CM | POA: Diagnosis not present

## 2023-12-09 DIAGNOSIS — R32 Unspecified urinary incontinence: Secondary | ICD-10-CM | POA: Diagnosis not present

## 2023-12-09 DIAGNOSIS — M47816 Spondylosis without myelopathy or radiculopathy, lumbar region: Secondary | ICD-10-CM | POA: Diagnosis not present

## 2023-12-10 ENCOUNTER — Telehealth: Payer: Self-pay

## 2023-12-10 NOTE — Telephone Encounter (Signed)
 Called Carrie from [facility] back and gave verbal orders. She states that patient mentioned our office ordering MRI. I stated what PCP [name], NP said and Lyla Son from [facility] said she "thinks" patient should have an MRI of her lower back but she will talk to patient when she see's her again to confirm if it was our office who is suppose to do MRI or if it's suppose to be [facility]. Message routed back to PCP Janyth Contes, Janene Harvey, NP as Lorain Childes.

## 2023-12-10 NOTE — Telephone Encounter (Signed)
Message routed to PCP Eubanks, Jessica K, NP  

## 2023-12-10 NOTE — Telephone Encounter (Signed)
 MRI was ordered through ortho I do believe.

## 2023-12-10 NOTE — Telephone Encounter (Signed)
 Copied from CRM [id number]. Topic: Clinical - Home Health Verbal Orders >> Dec 10, 2023  8:25 AM Yvone Neu wrote: Caller/Agency: [facility] Callback Number: [phone] Service Requested: Physical Therapy Frequency: 2 week 1, 1 week 8 Any new concerns about the patient? Yes, Lyla Son wants to know if the patient has an MRI order in or if she even has an MRI scheduled and wants to know the status of it.

## 2023-12-13 DIAGNOSIS — G8929 Other chronic pain: Secondary | ICD-10-CM | POA: Diagnosis not present

## 2023-12-13 DIAGNOSIS — M1612 Unilateral primary osteoarthritis, left hip: Secondary | ICD-10-CM | POA: Diagnosis not present

## 2023-12-13 DIAGNOSIS — M25552 Pain in left hip: Secondary | ICD-10-CM | POA: Diagnosis not present

## 2023-12-14 DIAGNOSIS — Z885 Allergy status to narcotic agent status: Secondary | ICD-10-CM | POA: Diagnosis not present

## 2023-12-14 DIAGNOSIS — Z9109 Other allergy status, other than to drugs and biological substances: Secondary | ICD-10-CM | POA: Diagnosis not present

## 2023-12-14 DIAGNOSIS — Z79899 Other long term (current) drug therapy: Secondary | ICD-10-CM | POA: Diagnosis not present

## 2023-12-14 DIAGNOSIS — Z87891 Personal history of nicotine dependence: Secondary | ICD-10-CM | POA: Diagnosis not present

## 2023-12-14 DIAGNOSIS — I10 Essential (primary) hypertension: Secondary | ICD-10-CM | POA: Diagnosis not present

## 2023-12-14 DIAGNOSIS — G8929 Other chronic pain: Secondary | ICD-10-CM | POA: Diagnosis not present

## 2023-12-14 DIAGNOSIS — F32A Depression, unspecified: Secondary | ICD-10-CM | POA: Diagnosis not present

## 2023-12-14 DIAGNOSIS — Z888 Allergy status to other drugs, medicaments and biological substances status: Secondary | ICD-10-CM | POA: Diagnosis not present

## 2023-12-14 DIAGNOSIS — M25552 Pain in left hip: Secondary | ICD-10-CM | POA: Diagnosis not present

## 2023-12-15 ENCOUNTER — Telehealth: Payer: Self-pay

## 2023-12-15 NOTE — Telephone Encounter (Signed)
 Copied from CRM [id number]. Topic: Clinical - Home Health Verbal Orders >> Dec 15, 2023  1:34 PM Maryln Sober wrote: Caller/Agency: [facility] Callback Number: [phone] Service Requested: Occupational Therapy Frequency: 1 week 7 starting today Any new concerns about the patient? Yes, patient was seen in Er for left hip pain. Patient was given a shot and a patch for pain  Left a detailed secure message to give Home Health Verbal Orders for Occupational Therapy. Zada Herrlich, NP was notify  Message sent to Verma Gobble, NP

## 2023-12-16 NOTE — Telephone Encounter (Signed)
 Noted, thank you

## 2023-12-23 DIAGNOSIS — M1612 Unilateral primary osteoarthritis, left hip: Secondary | ICD-10-CM | POA: Diagnosis not present

## 2023-12-23 DIAGNOSIS — E669 Obesity, unspecified: Secondary | ICD-10-CM | POA: Diagnosis not present

## 2023-12-23 DIAGNOSIS — M25452 Effusion, left hip: Secondary | ICD-10-CM | POA: Diagnosis not present

## 2023-12-23 DIAGNOSIS — I10 Essential (primary) hypertension: Secondary | ICD-10-CM | POA: Diagnosis not present

## 2023-12-27 ENCOUNTER — Telehealth: Payer: Self-pay

## 2023-12-27 NOTE — Telephone Encounter (Signed)
 Copied from CRM [id number]. Topic: Appointments - Appointment Info/Confirmation >> Dec 27, 2023  3:56 PM [name] wrote: Patient/patient representative is calling for information regarding an appointment.   Patient Brenda Mcfarland update for AWV for video on 04/29 and Follow on 05/12. Satted comment state something different needs clarification on visits. Called office and tranferred. No further questions

## 2023-12-27 NOTE — Telephone Encounter (Addendum)
 Patient has upcoming appointments scheduled

## 2023-12-28 ENCOUNTER — Ambulatory Visit (INDEPENDENT_AMBULATORY_CARE_PROVIDER_SITE_OTHER): Payer: Medicare HMO | Admitting: Nurse Practitioner

## 2023-12-28 VITALS — Ht 67.0 in | Wt 213.0 lb

## 2023-12-28 DIAGNOSIS — E2839 Other primary ovarian failure: Secondary | ICD-10-CM | POA: Diagnosis not present

## 2023-12-28 DIAGNOSIS — Z0000 Encounter for general adult medical examination without abnormal findings: Secondary | ICD-10-CM | POA: Diagnosis not present

## 2023-12-28 NOTE — Progress Notes (Signed)
 Subjective:   Brenda Mcfarland is a 78 y.o. female who presents for Medicare Annual (Subsequent) preventive examination.  Visit Complete: Virtual I connected with  Tashyia Bowerman on 12/28/23 by a video and audio enabled telemedicine application and verified that I am speaking with the correct person using two identifiers.  Patient Location: Home  Provider Location: Office/Clinic  I discussed the limitations of evaluation and management by telemedicine. The patient expressed understanding and agreed to proceed.  Vital Signs: Because this visit was a virtual/telehealth visit, some criteria may be missing or patient reported. Any vitals not documented were not able to be obtained and vitals that have been documented are patient reported.  Cardiac Risk Factors include: advanced age (>34men, >12 women)     Objective:    Today's Vitals   12/28/23 1138  Weight: 213 lb (96.6 kg)  Height: 5\' 7"  (1.702 m)  PainSc: 3    Body mass index is 33.36 kg/m.     12/28/2023   10:18 AM 09/17/2023    2:11 PM 08/20/2023    2:09 PM 05/21/2023    3:03 PM 12/17/2022   10:20 AM 09/30/2022   11:12 AM 05/01/2022    2:17 PM  Advanced Directives  Does Patient Have a Medical Advance Directive? Yes Yes Yes No Yes Yes Yes  Type of Estate agent of [employer] Power of [place];Living will Healthcare Power of [place];Living will  Healthcare Power of [place];Living will Healthcare Power of [place];Living will Healthcare Power of [place];Living will;Out of facility DNR (pink MOST or yellow form)  Does patient want to make changes to medical advance directive? No - Patient declined No - Patient declined No - Patient declined  No - Patient declined No - Patient declined No - Patient declined  Copy of Healthcare Power of Attorney in Chart? No - copy requested Yes - validated most recent copy scanned in chart (See row information) Yes - validated most recent copy scanned in chart (See row  information)  No - copy requested No - copy requested Yes - validated most recent copy scanned in chart (See row information)  Would patient like information on creating a medical advance directive?    Yes (MAU/Ambulatory/Procedural Areas - Information given)     Pre-existing out of facility DNR order (yellow form or pink MOST form)       Pink MOST/Yellow Form most recent copy in chart - Physician notified to receive inpatient order;Pink MOST form placed in chart (order not valid for inpatient use)    Current Medications (verified) Outpatient Encounter Medications as of 12/28/2023  Medication Sig   acetaminophen  (TYLENOL ) 500 MG tablet Take 1,000 mg by mouth 2 (two) times daily as needed.   amLODipine  (NORVASC ) 5 MG tablet Take 1 tablet (5 mg total) by mouth daily.   HYDROcodone -acetaminophen  (NORCO) 10-325 MG tablet Take 1 tablet by mouth every 6 (six) hours as needed.   Ibuprofen 200 MG CAPS Take by mouth as needed.   latanoprost (XALATAN) 0.005 % ophthalmic solution Place 1 drop into both eyes at bedtime.   losartan  (COZAAR ) 50 MG tablet TAKE ONE TABLET BY MOUTH ONE TIME DAILY   MAGNESIUM GLYCINATE PO Take 350 mg by mouth.   melatonin 5 MG TABS Take by mouth at bedtime. 1-2 tablets   OVER THE COUNTER MEDICATION daily. Essential Oils as needed   polyethylene glycol (MIRALAX / GLYCOLAX) 17 g packet Take 17 g by mouth as needed.   No facility-administered encounter medications on file as  of 12/28/2023.    Allergies (verified) Amlodipine , Aripiprazole, Carvedilol, Hydralazine , Irbesartan , Meclizine , Paroxetine hcl, Tizanidine, Tramadol, Zolpidem, and Zyrtec [cetirizine hcl]   History: Past Medical History:  Diagnosis Date   Abnormal EKG 10/23/2016   Arthritis 01/18/2020   Chronic fatigue 10/08/2015   Depression 01/18/2020   Diabetes mellitus (HCC) 10/08/2015   Formatting of this note might be different from the original. Overview:  last Hgb A1C 7.2/no blood sugars at home Formatting of this  note might be different from the original. last Hgb A1C 7.2/no blood sugars at home   ESR raised 11/27/2016   Frequent urination 12/16/2018   Glaucoma 10/08/2015   High cholesterol    History of pancreatitis 07/11/2014   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/23/2016   Hypertension 10/08/2015   IFG (impaired fasting glucose) 10/24/2019   Incomplete emptying of bladder 11/30/2017   Knee pain, left 12/22/2016   Loss of memory 01/18/2020   Major depressive disorder in partial remission (HCC) 10/08/2015   Morbid obesity (HCC) 10/23/2016   Obesity (BMI 30-39.9) 11/10/2016   Obstructive sleep apnea 12/16/2018   Osteoarthritis of both knees 11/27/2016   Other insomnia 03/05/2017   Palpitations 10/23/2016   Positive ANA (antinuclear antibody) 10/23/2016   Sleep apnea 10/08/2015   Formatting of this note might be different from the original. Overview:  does not use CPAP Formatting of this note might be different from the original. does not use CPAP   Sore neck 10/24/2019   Spondylosis of cervical spine with myelopathy 01/18/2020   Type 2 diabetes mellitus without complication, without long-term current use of insulin (HCC) 12/16/2018   Vitamin D  deficiency 10/08/2015   Past Surgical History:  Procedure Laterality Date   APPENDECTOMY     age 42   CESAREAN SECTION     X 3   NECK SURGERY  2015   OTHER SURGICAL HISTORY  08/16/2023   Spinal injection   SPINE SURGERY  05/29/2015   Family History  Problem Relation Age of Onset   Diabetes Mother    Multiple sclerosis Mother    Heart disease Father    Heart Problems Father    Migraines Daughter    Thyroid  disease Daughter    Migraines Daughter    Social History   Socioeconomic History   Marital status: Unknown    Spouse name: Not on file   Number of children: Not on file   Years of education: Not on file   Highest education level: Bachelor's degree (e.g., BA, AB, BS)  Occupational History   Not on file  Tobacco Use   Smoking status: Former    Current packs/day: 0.00     Average packs/day: 1 pack/day for 9.0 years (9.0 ttl pk-yrs)    Types: Cigarettes    Start date: 29    Quit date: 56    Years since quitting: 52.3   Smokeless tobacco: Never  Vaping Use   Vaping status: Never Used  Substance and Sexual Activity   Alcohol use: Not Currently   Drug use: Not Currently   Sexual activity: Not on file  Other Topics Concern   Not on file  Social History Narrative   Tobacco use, amount per day now: No   Past tobacco use, amount per day: 10 years. , 1 pack daily.   How many years did you use tobacco: 10 years   Alcohol use (drinks per week): None.   Diet: Simple- Eggs, Sausage, Toast, Chicken/Beef Salad or Vegetables.    Do you drink/eat things with  caffeine: No   Marital status:   Divorced                               What year were you married? 1968   Do you live in a house, apartment, assisted living, condo, trailer, etc.? House.   Is it one or more stories? One   How many persons live in your home? Self.   Do you have pets in your home?( please list) 1 Jessy [place]    Highest Level of education completed? Undergrad.    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   Do you exercise? No                                 Type and how often?   Do you have a living will? No   Do you have a DNR form? Yes                                  If not, do you want to discuss one? No   Do you have signed POA/HPOA forms? Yes                       If so, please bring to you appointment      Do you have any difficulty bathing or dressing yourself? Yes   Do you have any difficulty preparing food or eating? Yes   Do you have any difficulty managing your medications? No   Do you have any difficulty managing your finances? No   Do you have any difficulty affording your medications? No   Social Drivers of Corporate investment banker Strain: Low Risk  (05/20/2023)   Overall Financial Resource Strain (CARDIA)    Difficulty of Paying Living Expenses:  Not hard at all  Food Insecurity: No Food Insecurity (12/28/2023)   Hunger Vital Sign    Worried About Running Out of Food in the Last Year: Never true    Ran Out of Food in the Last Year: Never true  Transportation Needs: No Transportation Needs (12/28/2023)   PRAPARE - Administrator, Civil Service (Medical): No    Lack of Transportation (Non-Medical): No  Physical Activity: Unknown (05/20/2023)   Exercise Vital Sign    Days of Exercise per Week: 0 days    Minutes of Exercise per Session: Not on file  Stress: No Stress Concern Present (05/20/2023)   [employer] of Occupational Health - Occupational Stress Questionnaire    Feeling of Stress : Only a little  Social Connections: Moderately Integrated (05/20/2023)   Social Connection and Isolation Panel [NHANES]    Frequency of Communication with Friends and Family: More than three times a week    Frequency of Social Gatherings with Friends and Family: Twice a week    Attends Religious Services: More than 4 times per year    Active Member of [employer] or Organizations: Yes    Attends Engineer, structural: More than 4 times per year    Marital Status: Divorced    Tobacco Counseling Counseling given: Not Answered   Clinical Intake:  Pre-visit preparation completed: Yes  Pain : 0-10 Pain Score: 3  Pain Type: Chronic pain Pain Location: Hip Pain Orientation: Left  BMI - recorded: 33 Nutritional Status: BMI > 30  Obese Nutritional Risks: None Diabetes: Yes  How often do you need to have someone help you when you read instructions, pamphlets, or other written materials from your doctor or pharmacy?: 1 - Never         Activities of Daily Living    12/28/2023   10:19 AM  In your present state of health, do you have any difficulty performing the following activities:  Hearing? 0  Vision? 0  Difficulty concentrating or making decisions? 0  Walking or climbing stairs? 1  Dressing or bathing? 1   Doing errands, shopping? 1  Preparing Food and eating ? Y  Using the Toilet? Y  In the past six months, have you accidently leaked urine? Y  Do you have problems with loss of bowel control? N  Managing your Medications? N  Managing your Finances? N  Housekeeping or managing your Housekeeping? Y    Patient Care Team: Verma Gobble, NP as PCP - General (Geriatric Medicine) Tobb, Kardie, DO as PCP - Cardiology (Cardiology) Flavio Huguenin, MD as Referring Physician (Orthopedic Surgery) Manfred Seed, MD as Consulting Physician (Cardiology)  Indicate any recent Medical Services you may have received from other than [facility] providers in the past year (date may be approximate).     Assessment:   This is a routine wellness examination for Damyra.  Hearing/Vision screen Vision Screening - Comments:: Dr. Luster Salters Last Exam: 2024   Goals Addressed   None    Depression Screen    12/28/2023   10:18 AM 12/07/2023    9:26 AM 08/20/2023    2:08 PM 05/21/2023    3:02 PM 12/17/2022   10:22 AM 12/15/2021    1:04 PM 08/21/2021   10:05 AM  PHQ 2/9 Scores  PHQ - 2 Score 0 0 0 0 0 0 0    Fall Risk    12/28/2023   10:18 AM 12/07/2023    9:25 AM 09/17/2023    2:11 PM 08/20/2023    2:08 PM 05/21/2023    3:02 PM  Fall Risk   Falls in the past year? 0 0 0 0 0  Number falls in past yr: 0 0 0 0 0  Injury with Fall? 0 0 0 0 0  Risk for fall due to : Impaired mobility No Fall Risks No Fall Risks No Fall Risks No Fall Risks  Follow up Falls evaluation completed Falls evaluation completed Falls evaluation completed;Education provided;Falls prevention discussed Falls evaluation completed Falls evaluation completed    MEDICARE RISK AT HOME: Medicare Risk at Home Any stairs in or around the home?: Yes If so, are there any without handrails?: No Home free of loose throw rugs in walkways, pet beds, electrical cords, etc?: Yes Adequate lighting in your home to reduce risk of falls?:  Yes Life alert?: Yes Use of a cane, walker or w/c?: Yes Grab bars in the bathroom?: Yes Shower chair or bench in shower?: Yes Elevated toilet seat or a handicapped toilet?: Yes  TIMED UP AND GO:  Was the test performed?  No    Cognitive Function:        12/28/2023   10:22 AM 12/17/2022   10:22 AM 07/03/2021    4:18 PM  6CIT Screen  What Year? 0 points 0 points 0 points  What month? 0 points 0 points 0 points  What time? 0 points 0 points 0 points  Count back from 20 0 points 0  points 0 points  Months in reverse 0 points 0 points 0 points  Repeat phrase 2 points 2 points 0 points  Total Score 2 points 2 points 0 points    Immunizations Immunization History  Administered Date(s) Administered   Tdap 06/30/2018    TDAP status: Up to date  Flu Vaccine status: Up to date  Pneumococcal vaccine status: Due, Education has been provided regarding the importance of this vaccine. Advised may receive this vaccine at local pharmacy or Health Dept. Aware to provide a copy of the vaccination record if obtained from local pharmacy or Health Dept. Verbalized acceptance and understanding.  Covid-19 vaccine status: Information provided on how to obtain vaccines.   Qualifies for Shingles Vaccine? Yes   Zostavax completed No   Shingrix Completed?: No.    Education has been provided regarding the importance of this vaccine. Patient has been advised to call insurance company to determine out of pocket expense if they have not yet received this vaccine. Advised may also receive vaccine at local pharmacy or Health Dept. Verbalized acceptance and understanding.  Screening Tests Health Maintenance  Topic Date Due   FOOT EXAM  Never done   DEXA SCAN  Never done   Diabetic kidney evaluation - Urine ACR  12/16/2022   HEMOGLOBIN A1C  08/25/2023   Zoster Vaccines- Shingrix (1 of 2) 08/19/2024 (Originally 01/19/1965)   Pneumonia Vaccine 51+ Years old (1 of 2 - PCV) 12/27/2024 (Originally 01/19/1965)    Diabetic kidney evaluation - eGFR measurement  02/23/2024   INFLUENZA VACCINE  03/31/2024   OPHTHALMOLOGY EXAM  04/08/2024   Medicare Annual Wellness (AWV)  12/27/2024   DTaP/Tdap/Td (2 - Td or Tdap) 06/30/2028   Hepatitis C Screening  Completed   HPV VACCINES  Aged Out   Meningococcal B Vaccine  Aged Out   COVID-19 Vaccine  Discontinued    Health Maintenance  Health Maintenance Due  Topic Date Due   FOOT EXAM  Never done   DEXA SCAN  Never done   Diabetic kidney evaluation - Urine ACR  12/16/2022   HEMOGLOBIN A1C  08/25/2023    Colorectal cancer screening: No longer required.   Mammogram status:aged out  Bone Density status: Ordered today. Pt provided with contact info and advised to call to schedule appt.  Lung Cancer Screening: (Low Dose CT Chest recommended if Age 78-80 years, 20 pack-year currently smoking OR have quit w/in 15years.) does not qualify.   Lung Cancer Screening Referral: na  Additional Screening:  Hepatitis C Screening: does qualify; Completed   Vision Screening: Recommended annual ophthalmology exams for early detection of glaucoma and other disorders of the eye. Is the patient up to date with their annual eye exam?  Yes  Who is the provider or what is the name of the office in which the patient attends annual eye exams? [facility] If pt is not established with a provider, would they like to be referred to a provider to establish care? No .   Dental Screening: Recommended annual dental exams for proper oral hygiene  Complete diabetic foot examwith next office visit   Community Resource Referral / Chronic Care Management: CRR required this visit?  No   CCM required this visit?  No     Plan:     I have personally reviewed and noted the following in the patient's chart:   Medical and social history Use of alcohol, tobacco or illicit drugs  Current medications and supplements including opioid prescriptions. Patient is currently taking  opioid  prescriptions. Information provided to patient regarding non-opioid alternatives. Patient advised to discuss non-opioid treatment plan with their provider. Functional ability and status Nutritional status Physical activity Advanced directives List of other physicians Hospitalizations, surgeries, and ER visits in previous 12 months Vitals Screenings to include cognitive, depression, and falls Referrals and appointments  In addition, I have reviewed and discussed with patient certain preventive protocols, quality metrics, and best practice recommendations. A written personalized care plan for preventive services as well as general preventive health recommendations were provided to patient.     Verma Gobble, NP   12/28/2023   After Visit Summary: (MyChart) Due to this being a telephonic visit, the after visit summary with patients personalized plan was offered to patient via MyChart

## 2023-12-28 NOTE — Patient Instructions (Signed)
  Brenda Mcfarland , Thank you for taking time to come for your Medicare Wellness Visit. I appreciate your ongoing commitment to your health goals. Please review the following plan we discussed and let me know if I can assist you in the future.  This is a list of the screening recommended for you and due dates:  Health Maintenance  Topic Date Due   Complete foot exam   Never done   DEXA scan (bone density measurement)  Never done   Yearly kidney health urinalysis for diabetes  12/16/2022   Hemoglobin A1C  08/25/2023   Zoster (Shingles) Vaccine (1 of 2) 08/19/2024*   Pneumonia Vaccine (1 of 2 - PCV) 12/27/2024*   Yearly kidney function blood test for diabetes  02/23/2024   Flu Shot  03/31/2024   Eye exam for diabetics  04/08/2024   Medicare Annual Wellness Visit  12/27/2024   DTaP/Tdap/Td vaccine (2 - Td or Tdap) 06/30/2028   Hepatitis C Screening  Completed   HPV Vaccine  Aged Out   Meningitis B Vaccine  Aged Out   COVID-19 Vaccine  Discontinued  *Topic was postponed. The date shown is not the original due date.

## 2023-12-28 NOTE — Progress Notes (Signed)
This service is provided via telemedicine  No vital signs collected/recorded due to the encounter was a telemedicine visit.   Location of patient (ex: home, work):  Home  Patient consents to a telephone visit:  Yes  Location of the provider (ex: office, home):  Office [place].   Name of any referring provider:  na  Names of all persons participating in the telemedicine service and their role in the encounter:  Sabino Niemann, Patient, Nelda Severe, CMA, Abbey Chatters, NP  Time spent on call:  7:11

## 2023-12-30 DIAGNOSIS — M25552 Pain in left hip: Secondary | ICD-10-CM | POA: Diagnosis not present

## 2023-12-30 DIAGNOSIS — M1612 Unilateral primary osteoarthritis, left hip: Secondary | ICD-10-CM | POA: Diagnosis not present

## 2024-01-10 ENCOUNTER — Ambulatory Visit (INDEPENDENT_AMBULATORY_CARE_PROVIDER_SITE_OTHER): Admitting: Nurse Practitioner

## 2024-01-10 ENCOUNTER — Encounter: Payer: Self-pay | Admitting: Nurse Practitioner

## 2024-01-10 VITALS — BP 132/70 | HR 87 | Ht 67.0 in | Wt 213.0 lb

## 2024-01-10 DIAGNOSIS — M48061 Spinal stenosis, lumbar region without neurogenic claudication: Secondary | ICD-10-CM | POA: Diagnosis not present

## 2024-01-10 DIAGNOSIS — M1612 Unilateral primary osteoarthritis, left hip: Secondary | ICD-10-CM | POA: Diagnosis not present

## 2024-01-10 DIAGNOSIS — I10 Essential (primary) hypertension: Secondary | ICD-10-CM | POA: Diagnosis not present

## 2024-01-10 DIAGNOSIS — F419 Anxiety disorder, unspecified: Secondary | ICD-10-CM | POA: Diagnosis not present

## 2024-01-10 DIAGNOSIS — K5901 Slow transit constipation: Secondary | ICD-10-CM | POA: Diagnosis not present

## 2024-01-10 DIAGNOSIS — E119 Type 2 diabetes mellitus without complications: Secondary | ICD-10-CM

## 2024-01-10 DIAGNOSIS — E1169 Type 2 diabetes mellitus with other specified complication: Secondary | ICD-10-CM | POA: Diagnosis not present

## 2024-01-10 DIAGNOSIS — E785 Hyperlipidemia, unspecified: Secondary | ICD-10-CM

## 2024-01-10 MED ORDER — DULOXETINE HCL 20 MG PO CPEP: 20.0000 mg | ORAL_CAPSULE | Freq: Every day | ORAL | 1 refills | Status: DC

## 2024-01-10 NOTE — Progress Notes (Signed)
 Careteam: Patient Care Team: Verma Gobble, NP as PCP - General (Geriatric Medicine) Tobb, Kardie, DO as PCP - Cardiology (Cardiology) Flavio Huguenin, MD as Referring Physician (Orthopedic Surgery) Manfred Seed, MD as Consulting Physician (Cardiology)  PLACE OF SERVICE:  [facility]  Advanced Directive information    Allergies  Allergen Reactions   Amlodipine  Swelling   Aripiprazole Other (See Comments)    Tongue twisted, lips curled up Tongue twisted, lips curled up    Carvedilol Other (See Comments)    Nightmares Nightmares    Hydralazine      Diarrhea, abdominal pain   Irbesartan      dizziness   Meclizine  Other (See Comments)    Dizziness    Paroxetine Hcl Other (See Comments)    . Aaron Aas    Tizanidine Other (See Comments)    Syncope Syncope    Tramadol Other (See Comments)    Unknown   Zolpidem Other (See Comments)    Sleepwalking Sleepwalking    Zyrtec [Cetirizine Hcl]     Dizziness    Chief Complaint  Patient presents with   Follow-up    Discuss  having  labs done, pt stated that she isn't having any out concerns    HPI:  Discussed the use of AI scribe software for clinical note transcription with the patient, who gave verbal consent to proceed.  History of Present Illness Donovan Dehaas is a 78 year old female who presents for a routine follow-up and lab work. She is accompanied by daughter who assists with managing her medical information.  She experiences chronic hip pain, which has been particularly severe over the last few days despite taking hydrocodone . The pain fluctuates throughout the day, often reaching severe levels and impacting her sleep. She is awaiting hip replacement surgery scheduled for late July. Ice provides some relief, but Celebrex was discontinued due to dizziness and nausea, likely related to her history of vertigo. She is concerned about the risk of falling due to dizziness.  Her blood pressure is  generally stable at 132 mmHg but can rise to 140 mmHg when her pain is severe.   She has a history of being allergic to tramadol and has previously found relief from Toradol  injections, which are limited to every three months. She inquires about alternative NSAIDs but is concerned about potential side effects similar to those experienced with Celebrex.  She experiences low mood and anxiety related to her chronic pain but manages it through spiritual practices and therapy. She reports occasional anxiety that manifests physically as shaking, which she manages by changing her focus and practicing breathing exercises.  She uses hydrocodone  for pain management but notes it takes a long time to take effect and does not last long enough. She supplements with a 'pain bomb' made of essential oils between doses, which she finds effective.  She experiences constipation from the pain medication and manages it with Miralax, which she takes as needed but acknowledges she should take more regularly.    Review of Systems:  Review of Systems  Constitutional:  Negative for chills, fever and weight loss.  HENT:  Negative for tinnitus.   Respiratory:  Negative for cough, sputum production and shortness of breath.   Cardiovascular:  Negative for chest pain, palpitations and leg swelling.  Gastrointestinal:  Negative for abdominal pain, constipation, diarrhea and heartburn.  Genitourinary:  Negative for dysuria, frequency and urgency.  Musculoskeletal:  Positive for joint pain. Negative for back pain, falls and myalgias.  Skin: Negative.   Neurological:  Positive for dizziness. Negative for headaches.  Psychiatric/Behavioral:  Positive for depression. Negative for memory loss. The patient is nervous/anxious and has insomnia.     Past Medical History:  Diagnosis Date   Abnormal EKG 10/23/2016   Arthritis 01/18/2020   Chronic fatigue 10/08/2015   Depression 01/18/2020   Diabetes mellitus (HCC) 10/08/2015    Formatting of this note might be different from the original. Overview:  last Hgb A1C 7.2/no blood sugars at home Formatting of this note might be different from the original. last Hgb A1C 7.2/no blood sugars at home   ESR raised 11/27/2016   Frequent urination 12/16/2018   Glaucoma 10/08/2015   High cholesterol    History of pancreatitis 07/11/2014   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/23/2016   Hypertension 10/08/2015   IFG (impaired fasting glucose) 10/24/2019   Incomplete emptying of bladder 11/30/2017   Knee pain, left 12/22/2016   Loss of memory 01/18/2020   Major depressive disorder in partial remission (HCC) 10/08/2015   Morbid obesity (HCC) 10/23/2016   Obesity (BMI 30-39.9) 11/10/2016   Obstructive sleep apnea 12/16/2018   Osteoarthritis of both knees 11/27/2016   Other insomnia 03/05/2017   Palpitations 10/23/2016   Positive ANA (antinuclear antibody) 10/23/2016   Sleep apnea 10/08/2015   Formatting of this note might be different from the original. Overview:  does not use CPAP Formatting of this note might be different from the original. does not use CPAP   Sore neck 10/24/2019   Spondylosis of cervical spine with myelopathy 01/18/2020   Type 2 diabetes mellitus without complication, without long-term current use of insulin (HCC) 12/16/2018   Vitamin D  deficiency 10/08/2015   Past Surgical History:  Procedure Laterality Date   APPENDECTOMY     age 67   CESAREAN SECTION     X 3   NECK SURGERY  2015   OTHER SURGICAL HISTORY  08/16/2023   Spinal injection   SPINE SURGERY  05/29/2015   Social History:   reports that she quit smoking about 52 years ago. Her smoking use included cigarettes. She started smoking about 61 years ago. She has a 9 pack-year smoking history. She has never used smokeless tobacco. She reports that she does not currently use alcohol. She reports that she does not currently use drugs.  Family History  Problem Relation Age of Onset   Diabetes Mother    Multiple sclerosis Mother    Heart  disease Father    Heart Problems Father    Migraines Daughter    Thyroid  disease Daughter    Migraines Daughter     Medications: Patient's Medications  New Prescriptions   DULOXETINE  (CYMBALTA ) 20 MG CAPSULE    Take 1 capsule (20 mg total) by mouth daily.  Previous Medications   ACETAMINOPHEN  (TYLENOL ) 500 MG TABLET    Take 1,000 mg by mouth 2 (two) times daily as needed.   ACETAMINOPHEN  (TYLENOL ) 650 MG CR TABLET    Take 650 mg by mouth every 8 (eight) hours as needed for pain.   AMLODIPINE  (NORVASC ) 5 MG TABLET    Take 1 tablet (5 mg total) by mouth daily.   HYDROCODONE -ACETAMINOPHEN  (NORCO) 10-325 MG TABLET    Take 1 tablet by mouth every 6 (six) hours as needed.   IBUPROFEN 200 MG CAPS    Take by mouth as needed.   LATANOPROST (XALATAN) 0.005 % OPHTHALMIC SOLUTION    Place 1 drop into both eyes at bedtime.   LOSARTAN  (COZAAR ) 50  MG TABLET    TAKE ONE TABLET BY MOUTH ONE TIME DAILY   MAGNESIUM GLYCINATE PO    Take 350 mg by mouth.   MELATONIN 5 MG TABS    Take by mouth at bedtime. 1-2 tablets   OVER THE COUNTER MEDICATION    daily. Essential Oils as needed   POLYETHYLENE GLYCOL (MIRALAX / GLYCOLAX) 17 G PACKET    Take 17 g by mouth as needed.  Modified Medications   No medications on file  Discontinued Medications   No medications on file    Physical Exam:  Vitals:   01/10/24 1332  BP: 132/70  Pulse: 87  SpO2: 92%  Weight: 213 lb (96.6 kg)  Height: 5\' 7"  (1.702 m)   Body mass index is 33.36 kg/m. Wt Readings from Last 3 Encounters:  01/10/24 213 lb (96.6 kg)  12/28/23 213 lb (96.6 kg)  05/21/23 229 lb (103.9 kg)    Physical Exam Constitutional:      General: She is not in acute distress.    Appearance: She is well-developed. She is not diaphoretic.  HENT:     Head: Normocephalic and atraumatic.     Mouth/Throat:     Pharynx: No oropharyngeal exudate.  Eyes:     Conjunctiva/sclera: Conjunctivae normal.     Pupils: Pupils are equal, round, and reactive to  light.  Cardiovascular:     Rate and Rhythm: Normal rate and regular rhythm.     Heart sounds: Normal heart sounds.  Pulmonary:     Effort: Pulmonary effort is normal.     Breath sounds: Normal breath sounds.  Abdominal:     General: Bowel sounds are normal.     Palpations: Abdomen is soft.  Musculoskeletal:     Cervical back: Normal range of motion and neck supple.     Right lower leg: No edema.     Left lower leg: No edema.  Skin:    General: Skin is warm and dry.  Neurological:     Mental Status: She is alert.  Psychiatric:        Mood and Affect: Mood normal.     Labs reviewed: Basic Metabolic Panel: Recent Labs    02/23/23 1212 01/10/24 1417  NA 139 139  K 4.2 3.9  CL 106 103  CO2 25 28  GLUCOSE 123* 121  BUN 24* 22  CREATININE 0.74 0.65  CALCIUM 9.2 9.1   Liver Function Tests: Recent Labs    02/23/23 1212 01/10/24 1417  AST 13 14  ALT 16 16  ALKPHOS 60  --   BILITOT 0.6 0.4  PROT 6.5 6.6  ALBUMIN 4.1  --    No results for input(s): "LIPASE", "AMYLASE" in the last 8760 hours. No results for input(s): "AMMONIA" in the last 8760 hours. CBC: Recent Labs    02/23/23 1212 01/10/24 1417  WBC 7.3 6.6  NEUTROABS 4.7 4,013  HGB 13.8 13.8  HCT 41.3 40.1  MCV 94.8 93.7  PLT 256.0 296   Lipid Panel: Recent Labs    01/10/24 1417  CHOL 211*  HDL 56  LDLCALC 125*  TRIG 186*  CHOLHDL 3.8   TSH: No results for input(s): "TSH" in the last 8760 hours. A1C: Lab Results  Component Value Date   HGBA1C 6.6 (H) 01/10/2024     Assessment/Plan Primary osteoarthritis of left hip Assessment & Plan: Severe continues with follow up with orthopedic and scheduled replacement in July    Spinal stenosis of lumbar region, unspecified whether  neurogenic claudication present Assessment & Plan: Ongoing but stable  Orders: -     AMB Referral VBCI Care Management  Type 2 diabetes mellitus with other specified complication, without long-term current use of  insulin (HCC) Assessment & Plan: Blood pressure well controlled, goal bp <140/90 Continue current medications and dietary modifications follow metabolic panel  Orders: -     Hemoglobin A1c -     Microalbumin / creatinine urine ratio  Hyperlipidemia LDL goal <70 Assessment & Plan: Not currently on medication, continue dietary modifications and will follow up lab  Orders: -     Lipid panel -     COMPLETE METABOLIC PANEL WITHOUT GFR  Primary hypertension Assessment & Plan: Blood pressure well controlled, goal bp <140/90 Continue current medications and dietary modifications follow metabolic panel  Orders: -     COMPLETE METABOLIC PANEL WITHOUT GFR -     CBC with Differential/Platelet  Anxiety Assessment & Plan: Some worsening anxiety due to pain and limited mobility, continues with support from daughter Will start cymbalta  to see if this could add benefit to anxiety and also help some pain.  Orders: -     AMB Referral VBCI Care Management -     DULoxetine  HCl; Take 1 capsule (20 mg total) by mouth daily.  Dispense: 30 capsule; Refill: 1  Slow transit constipation Assessment & Plan: Continue on OTC regimen to control      Return in about 4 weeks (around 02/07/2024) for mood.  Kayelee Herbig K. Denney Fisher[facility] [phone]

## 2024-01-11 DIAGNOSIS — M170 Bilateral primary osteoarthritis of knee: Secondary | ICD-10-CM | POA: Diagnosis not present

## 2024-01-11 DIAGNOSIS — R32 Unspecified urinary incontinence: Secondary | ICD-10-CM | POA: Diagnosis not present

## 2024-01-11 DIAGNOSIS — G4733 Obstructive sleep apnea (adult) (pediatric): Secondary | ICD-10-CM

## 2024-01-11 DIAGNOSIS — F3341 Major depressive disorder, recurrent, in partial remission: Secondary | ICD-10-CM | POA: Diagnosis not present

## 2024-01-11 DIAGNOSIS — M48061 Spinal stenosis, lumbar region without neurogenic claudication: Secondary | ICD-10-CM | POA: Diagnosis not present

## 2024-01-11 DIAGNOSIS — H409 Unspecified glaucoma: Secondary | ICD-10-CM | POA: Diagnosis not present

## 2024-01-11 DIAGNOSIS — E119 Type 2 diabetes mellitus without complications: Secondary | ICD-10-CM | POA: Diagnosis not present

## 2024-01-11 DIAGNOSIS — M47816 Spondylosis without myelopathy or radiculopathy, lumbar region: Secondary | ICD-10-CM | POA: Diagnosis not present

## 2024-01-11 DIAGNOSIS — Z6839 Body mass index (BMI) 39.0-39.9, adult: Secondary | ICD-10-CM | POA: Diagnosis not present

## 2024-01-11 DIAGNOSIS — E559 Vitamin D deficiency, unspecified: Secondary | ICD-10-CM

## 2024-01-11 DIAGNOSIS — G4709 Other insomnia: Secondary | ICD-10-CM | POA: Diagnosis not present

## 2024-01-11 DIAGNOSIS — I10 Essential (primary) hypertension: Secondary | ICD-10-CM | POA: Diagnosis not present

## 2024-01-11 DIAGNOSIS — Z87891 Personal history of nicotine dependence: Secondary | ICD-10-CM

## 2024-01-11 DIAGNOSIS — E7800 Pure hypercholesterolemia, unspecified: Secondary | ICD-10-CM | POA: Diagnosis not present

## 2024-01-11 LAB — CBC WITH DIFFERENTIAL/PLATELET
Absolute Lymphocytes: 1802 {cells}/uL (ref 850–3900)
Absolute Monocytes: 601 {cells}/uL (ref 200–950)
Basophils Absolute: 33 {cells}/uL (ref 0–200)
Basophils Relative: 0.5 %
Eosinophils Absolute: 152 {cells}/uL (ref 15–500)
Eosinophils Relative: 2.3 %
HCT: 40.1 % (ref 35.0–45.0)
Hemoglobin: 13.8 g/dL (ref 11.7–15.5)
MCH: 32.2 pg (ref 27.0–33.0)
MCHC: 34.4 g/dL (ref 32.0–36.0)
MCV: 93.7 fL (ref 80.0–100.0)
MPV: 10.3 fL (ref 7.5–12.5)
Monocytes Relative: 9.1 %
Neutro Abs: 4013 {cells}/uL (ref 1500–7800)
Neutrophils Relative %: 60.8 %
Platelets: 296 10*3/uL (ref 140–400)
RBC: 4.28 10*6/uL (ref 3.80–5.10)
RDW: 12.8 % (ref 11.0–15.0)
Total Lymphocyte: 27.3 %
WBC: 6.6 10*3/uL (ref 3.8–10.8)

## 2024-01-11 LAB — COMPLETE METABOLIC PANEL WITHOUT GFR
AG Ratio: 1.6 (calc) (ref 1.0–2.5)
ALT: 16 U/L (ref 6–29)
AST: 14 U/L (ref 10–35)
Albumin: 4.1 g/dL (ref 3.6–5.1)
Alkaline phosphatase (APISO): 59 U/L (ref 37–153)
BUN: 22 mg/dL (ref 7–25)
CO2: 28 mmol/L (ref 20–32)
Calcium: 9.1 mg/dL (ref 8.6–10.4)
Chloride: 103 mmol/L (ref 98–110)
Creat: 0.65 mg/dL (ref 0.60–1.00)
Globulin: 2.5 g/dL (ref 1.9–3.7)
Glucose, Bld: 121 mg/dL (ref 65–139)
Potassium: 3.9 mmol/L (ref 3.5–5.3)
Sodium: 139 mmol/L (ref 135–146)
Total Bilirubin: 0.4 mg/dL (ref 0.2–1.2)
Total Protein: 6.6 g/dL (ref 6.1–8.1)

## 2024-01-11 LAB — MICROALBUMIN / CREATININE URINE RATIO
Creatinine, Urine: 183 mg/dL (ref 20–275)
Microalb Creat Ratio: 20 mg/g{creat} (ref <30)
Microalb, Ur: 3.7 mg/dL

## 2024-01-11 LAB — HEMOGLOBIN A1C
Hgb A1c MFr Bld: 6.6 % — ABNORMAL HIGH (ref <5.7)
Mean Plasma Glucose: 143 mg/dL
eAG (mmol/L): 7.9 mmol/L

## 2024-01-11 LAB — LIPID PANEL
Cholesterol: 211 mg/dL — ABNORMAL HIGH (ref <200)
HDL: 56 mg/dL (ref >=50)
LDL Cholesterol (Calc): 125 mg/dL — ABNORMAL HIGH
Non-HDL Cholesterol (Calc): 155 mg/dL — ABNORMAL HIGH (ref <130)
Total CHOL/HDL Ratio: 3.8 (calc) (ref <5.0)
Triglycerides: 186 mg/dL — ABNORMAL HIGH (ref <150)

## 2024-01-12 ENCOUNTER — Encounter: Payer: Self-pay | Admitting: Nurse Practitioner

## 2024-01-13 ENCOUNTER — Ambulatory Visit: Payer: Self-pay | Admitting: Nurse Practitioner

## 2024-01-13 DIAGNOSIS — E785 Hyperlipidemia, unspecified: Secondary | ICD-10-CM | POA: Insufficient documentation

## 2024-01-13 DIAGNOSIS — M48061 Spinal stenosis, lumbar region without neurogenic claudication: Secondary | ICD-10-CM | POA: Insufficient documentation

## 2024-01-13 DIAGNOSIS — F419 Anxiety disorder, unspecified: Secondary | ICD-10-CM | POA: Insufficient documentation

## 2024-01-13 DIAGNOSIS — K5901 Slow transit constipation: Secondary | ICD-10-CM | POA: Insufficient documentation

## 2024-01-13 NOTE — Assessment & Plan Note (Signed)
 Ongoing but stable.

## 2024-01-13 NOTE — Assessment & Plan Note (Signed)
 Blood pressure well controlled, goal bp <140/90 Continue current medications and dietary modifications follow metabolic panel

## 2024-01-13 NOTE — Assessment & Plan Note (Signed)
 Not currently on medication, continue dietary modifications and will follow up lab

## 2024-01-13 NOTE — Assessment & Plan Note (Signed)
 Some worsening anxiety due to pain and limited mobility, continues with support from daughter Will start cymbalta  to see if this could add benefit to anxiety and also help some pain.

## 2024-01-13 NOTE — Assessment & Plan Note (Signed)
 Continue on OTC regimen to control

## 2024-01-13 NOTE — Assessment & Plan Note (Signed)
 Severe continues with follow up with orthopedic and scheduled replacement in July

## 2024-01-17 ENCOUNTER — Telehealth: Payer: Self-pay | Admitting: *Deleted

## 2024-01-17 DIAGNOSIS — M25552 Pain in left hip: Secondary | ICD-10-CM | POA: Diagnosis not present

## 2024-01-17 DIAGNOSIS — Z79891 Long term (current) use of opiate analgesic: Secondary | ICD-10-CM | POA: Diagnosis not present

## 2024-01-17 DIAGNOSIS — M1612 Unilateral primary osteoarthritis, left hip: Secondary | ICD-10-CM | POA: Diagnosis not present

## 2024-01-17 NOTE — Telephone Encounter (Signed)
 Copied from CRM [id number]. Topic: General - Other >> Ipek Westra 16, 2025  3:18 PM Adrianna P wrote: Reason for CRM: patient ot therapist called patient cancelled ot appointment for this week and will continue next week

## 2024-01-17 NOTE — Telephone Encounter (Signed)
 FYI

## 2024-01-21 ENCOUNTER — Telehealth: Payer: Self-pay | Admitting: *Deleted

## 2024-01-21 ENCOUNTER — Telehealth: Payer: Self-pay | Admitting: Nurse Practitioner

## 2024-01-21 NOTE — Progress Notes (Unsigned)
 Complex Care Management Note Care Guide Note  01/21/2024 Name: Brenda Mcfarland MRN: [id number] DOB: August 28, 1946   Complex Care Management Outreach Attempts: An unsuccessful telephone outreach was attempted today to offer the patient information about available complex care management services.  Follow Up Plan:  Additional outreach attempts will be made to offer the patient complex care management information and services.   Encounter Outcome:  No Answer  Barnie Bora  [facility]  [facility], [facility] Guide  Direct Dial: [phone]  Fax [phone]

## 2024-01-21 NOTE — Telephone Encounter (Signed)
 Copied from CRM [id number]. Topic: Clinical - Home Health Verbal Orders >> Jan 21, 2024  8:29 AM Madelyne Schiff wrote: Caller/Agency: Radovan with [facility]  Callback Number: [phone] Service Requested: Occupational Therapy Frequency:  Any new concerns about the patient? Yes  Missed OT visit for the 2nd week,  next appt is tuesday the 01/25/24   Please advise if okay

## 2024-01-26 NOTE — Progress Notes (Unsigned)
 Complex Care Management Note Care Guide Note  01/26/2024 Name: Brenda Mcfarland MRN: [id number] DOB: [birth year]   Complex Care Management Outreach Attempts: A second unsuccessful outreach was attempted today to offer the patient with information about available complex care management services.  Follow Up Plan:  Additional outreach attempts will be made to offer the patient complex care management information and services.   Encounter Outcome:  No Answer  Barnie Bora  [facility]  [facility], [facility] Guide  Direct Dial: [phone]  Fax [phone]

## 2024-01-28 NOTE — Progress Notes (Signed)
 Complex Care Management Note Care Guide Note  01/28/2024 Name: Brenda Mcfarland MRN: [id number] DOB: [birth year]   Complex Care Management Outreach Attempts: A third unsuccessful outreach was attempted today to offer the patient with information about available complex care management services.  Follow Up Plan:  No further outreach attempts will be made at this time. We have been unable to contact the patient to offer or enroll patient in complex care management services.  Encounter Outcome:  No Answer  Barnie Bora  [facility]  [facility], [facility] Guide  Direct Dial: [phone]  Fax [phone]

## 2024-01-31 ENCOUNTER — Telehealth: Payer: Self-pay | Admitting: Nurse Practitioner

## 2024-01-31 NOTE — Telephone Encounter (Signed)
 Noted thank you, please reach out to daughter to notify

## 2024-01-31 NOTE — Telephone Encounter (Signed)
 FYI....    Copied from CRM [id number]. Topic: General - Other >> Jan 31, 2024 11:57 AM Shelby Dessert H wrote: Reason for CRM: Radovan OT is calling from [facility] wanting to leaving a message for [facility], patient is going to be discharged from OT because she keeps moving her appointments and has not been attending she canceled the last three weeks of her visits, Radovan contacted the patients about rescheduling and the patient did not respond, he said if provider has any questions to call him at [phone].

## 2024-02-02 ENCOUNTER — Other Ambulatory Visit: Payer: Self-pay | Admitting: Nurse Practitioner

## 2024-02-02 DIAGNOSIS — F419 Anxiety disorder, unspecified: Secondary | ICD-10-CM

## 2024-02-03 ENCOUNTER — Telehealth: Payer: Self-pay | Admitting: *Deleted

## 2024-02-03 NOTE — Telephone Encounter (Signed)
 Left message for Terri, [facility] nurse. Offered glucometer if patient would like to check blood sugars. Regarding pain, she has tried many other medications without success. Recommend taking tylenol  1000 mg po TID, especially prior to PT sessions for pain. She may also use ice packs at pain site to help aid in pain reduction. She is also taking Cymbalta , which helps with chronic pain. Advised to call office back with any questions.

## 2024-02-03 NOTE — Telephone Encounter (Signed)
 Copied from CRM [id number]. Topic: Clinical - Home Health Verbal Orders >> Feb 03, 2024  3:55 PM Julie Oddi wrote: Caller/Agency: [facility] Callback Number: [phone] Service Requested: Physical Therapy Frequency: 1 Week 8 Any new concerns about the patient? Yes - Please review blood sugar, patient doesn't check it and does not take any medicine for it. Patient is in a lot more pain than usual.  Verbal Orders given to Home Health Forwarded to Ulyses Gandy, NP

## 2024-02-04 ENCOUNTER — Encounter: Payer: Self-pay | Admitting: Nurse Practitioner

## 2024-02-04 ENCOUNTER — Telehealth: Payer: Self-pay

## 2024-02-04 ENCOUNTER — Other Ambulatory Visit: Payer: Self-pay | Admitting: Orthopedic Surgery

## 2024-02-04 DIAGNOSIS — E1169 Type 2 diabetes mellitus with other specified complication: Secondary | ICD-10-CM

## 2024-02-04 NOTE — Telephone Encounter (Signed)
 I left message with Brenda Mcfarland/ [facility] nurse yesterday. I do not know what blood sugars they are referring to. Please ask if she has a glucometer, if not you can order one for her. Cymbalta  was stopped due to dizziness. She may take tylenol  1000 mg po TID and use ice for pain. At this point we have exhausted safe pain interventions. She will need to ask orthopedics for assistance moving forward.

## 2024-02-04 NOTE — Telephone Encounter (Signed)
 Copied from CRM [id number]. Topic: Clinical - Home Health Verbal Orders >> Feb 03, 2024  3:55 PM Julie Oddi wrote: Caller/Agency: [facility] Callback Number: [phone] Service Requested: Physical Therapy Frequency: 1 Week 8 Any new concerns about the patient? Yes - Please review blood sugar, patient doesn't check it and does not take any medicine for it. Patient is in a lot more pain than usual. >> Feb 04, 2024  1:04 PM Tisa Forester wrote: [facility]/ [facility]ibald Beard physical therapist returning Ulyses Gandy call , please call back  call back number [phone]

## 2024-02-04 NOTE — Telephone Encounter (Signed)
 Noted

## 2024-02-04 NOTE — Telephone Encounter (Signed)
 Spoke with Archibald Beard, per Archibald Beard patient states she has a glucometer, however she does not know where it is and admits to eating poorly due to visitors bring sweets. Archibald Beard wanted to make sure her blood sugars are being followed up on, I assured Archibald Beard that Verma Gobble, NP has recently checked A1c and gave patient dietary recommendations to follow.

## 2024-02-04 NOTE — Telephone Encounter (Signed)
 Amy please advise if I need to take action here

## 2024-02-07 MED ORDER — FREESTYLE LIBRE 3 READER DEVI: 1.0000 | Freq: Every day | 12 refills | Status: DC

## 2024-02-07 MED ORDER — FREESTYLE LIBRE 3 SENSOR MISC: 1.0000 | 12 refills | Status: DC

## 2024-02-07 NOTE — Telephone Encounter (Signed)
 Please advise if patient need to check blood sugars as her last A1c did not indicate

## 2024-02-24 ENCOUNTER — Telehealth: Payer: Self-pay

## 2024-02-24 NOTE — Telephone Encounter (Signed)
 Yes can do MOST with a video visit

## 2024-02-24 NOTE — Telephone Encounter (Signed)
 I have spoken to patient daughter Delon and let her know that she can pick up Health Care POA/Living will papers from our office. Once notorized she can bring back to us  and have it scanned into patient chart. However MOST form and DNR will have to be signed by PCP Caro, Jessica K, NP and requires a visit. I asked daughter if they need surgical clearance appointment with PCP before surgery. Daughter said that she's unsure but will contact office that patient is having surgery and make sure she doesn't need surgical clearance appointment with PCP. I provided her with our fax number incase they need to send over papers. She wanted to know if it is possible to do MOST form as MyChart visit due to having issues with bringing patient into office. Daughter wanted to know if it was fine to fill out MOST form during surgical clearance visit as well if it's necessary she needs to bring patient in. Message routed to PCP Caro, Harlene POUR, NP . Please Advise.

## 2024-02-24 NOTE — Telephone Encounter (Signed)
 Copied from CRM [id number]. Topic: Medical Record Request - Other >> Feb 24, 2024  1:42 PM Diannia H wrote: Reason for CRM: Patient daughter is needing paperwork for POA and she tried to do it through MyChart but she hasn't heard anything or receive anything. She is having hip replacement surgery and they are trying to get everything in order before her surgery on 07/21, patients daughter can be called back at [phone]. Please assist.

## 2024-02-25 NOTE — Telephone Encounter (Signed)
 Thank you for letting me know. I will inform patient daughter when she calls back.

## 2024-02-28 ENCOUNTER — Encounter: Payer: Self-pay | Admitting: Nurse Practitioner

## 2024-03-01 NOTE — Telephone Encounter (Signed)
 Please call daughter and if she would like to do appt with me in the AM I have appts for video visits.

## 2024-03-02 ENCOUNTER — Encounter: Payer: Self-pay | Admitting: Orthopedic Surgery

## 2024-03-02 ENCOUNTER — Encounter: Admitting: Orthopedic Surgery

## 2024-03-02 DIAGNOSIS — R54 Age-related physical debility: Secondary | ICD-10-CM | POA: Diagnosis not present

## 2024-03-02 DIAGNOSIS — G4733 Obstructive sleep apnea (adult) (pediatric): Secondary | ICD-10-CM | POA: Diagnosis not present

## 2024-03-02 DIAGNOSIS — E669 Obesity, unspecified: Secondary | ICD-10-CM | POA: Diagnosis not present

## 2024-03-02 DIAGNOSIS — I10 Essential (primary) hypertension: Secondary | ICD-10-CM | POA: Diagnosis not present

## 2024-03-02 DIAGNOSIS — Z01818 Encounter for other preprocedural examination: Secondary | ICD-10-CM | POA: Diagnosis not present

## 2024-03-02 DIAGNOSIS — E119 Type 2 diabetes mellitus without complications: Secondary | ICD-10-CM | POA: Diagnosis not present

## 2024-03-02 NOTE — Telephone Encounter (Signed)
 MyChart message sent to patient with PCP [name], NP reply.

## 2024-03-02 NOTE — Telephone Encounter (Signed)
 Copied from CRM [id number]. Topic: Clinical - Medication Question >> Mar 02, 2024  3:41 PM Miquel SAILOR wrote: Reason for CRM: Patient daughter Delon due to Patient is limited on what she can take for pain due to surgery instructed. Needs call back on recommendations on medication she can take for pain. [phone]

## 2024-03-02 NOTE — Telephone Encounter (Signed)
 Okay thank you

## 2024-03-02 NOTE — Telephone Encounter (Signed)
 Please see CRM and MyChart messages. Routed to PCP Caro, Harlene POUR, NP

## 2024-03-02 NOTE — Telephone Encounter (Signed)
 She needs to call surgeon to advise on what is recommend for pain since they are managing

## 2024-03-02 NOTE — Telephone Encounter (Signed)
Message routed back to PCP Dewaine Oats, Carlos American, NP

## 2024-03-02 NOTE — Progress Notes (Signed)
 This service is provided via telemedicine  No vital signs collected/recorded due to the encounter was a telemedicine visit.   Location of patient (ex: home, work):  home  Patient consents to a telephone visit: yes  Location of the provider (ex: office, home):  [facility]   Name of any referring provider:  N/A  Names of all persons participating in the telemedicine service and their role in the encounter:  Evertt Pereyra CMA, Gil, Amy NP and Patient, Patient daughter.   Time spent on call: 17

## 2024-03-05 NOTE — Progress Notes (Signed)
 This encounter was created in error - please disregard.

## 2024-03-06 ENCOUNTER — Ambulatory Visit: Payer: Self-pay

## 2024-03-06 NOTE — Telephone Encounter (Signed)
 Please call to offer her an appt to discuss sleep medication

## 2024-03-06 NOTE — Telephone Encounter (Signed)
 FYI Only or Action Required?: FYI only for provider.  Patient was last seen in primary care on 01/10/2024 by [name], NP.  Called Nurse Triage reporting Medication Refill.  Symptoms began today.  Interventions attempted: Prescription medications: Hydrocodone  10-325 mg.  Symptoms are: stable.  Triage Disposition: Call PCP When Office is Open  Patient/caregiver understands and will follow disposition?: Yes   Pt's daughter says pt requested refill from ortho on Hydrocodone  last Wed and hasn't gotten approved yet d/t holiday. She is going to run out this evening so pt and daughter are asking if PCP can refill this time and daughter can notify Ortho. She reached out to them today and they advised could be 1-2 days before refill processed.  HYDROcodone -acetaminophen  (NORCO) 10-325 MG tablet -- --  --   Sig - Route: Take 1 tablet by mouth every 6 (six) hours as needed. - Oral     Reason for Disposition  Caller requesting a CONTROLLED substance prescription refill (e.g., narcotics, ADHD medicines)  Answer Assessment - Initial Assessment Questions 1. DRUG NAME: What medicine do you need to have refilled?     Hydrocodone   2. REFILLS REMAINING: How many refills are remaining? (Note: The label on the medicine or pill bottle will show how many refills are remaining. If there are no refills remaining, then a renewal may be needed.)     0 4. PRESCRIBING HCP: Who prescribed it? Reason: If prescribed by specialist, call should be referred to that group.     Ortho provider 5. SYMPTOMS: Do you have any symptoms?     Extreme pain  Pt's daughter says pt requested refill from ortho on Hydrocodone  last Wed and hasn't gotten approved yet d/t holiday. She is going to run out this evening so pt and daughter are asking if PCP can refill this time and daughter can notify Ortho. She reached out to them today and they advised could be 1-2 days before refill processed.  Protocols used:  Medication Refill and Renewal Call-A-AH

## 2024-03-06 NOTE — Telephone Encounter (Signed)
 Pt's daughter says pt requested refill from ortho on Hydrocodone  last Wed and hasn't gotten approved yet d/t holiday. She is going to run out this evening so pt and daughter are asking if PCP can refill this time and daughter can notify Ortho. She reached out to them today and they advised could be 1-2 days before refill processed.     Please Advise.

## 2024-03-07 ENCOUNTER — Telehealth: Admitting: Adult Health

## 2024-03-07 ENCOUNTER — Encounter: Payer: Self-pay | Admitting: Adult Health

## 2024-03-07 ENCOUNTER — Ambulatory Visit: Admitting: Adult Health

## 2024-03-07 DIAGNOSIS — I10 Essential (primary) hypertension: Secondary | ICD-10-CM | POA: Diagnosis not present

## 2024-03-07 DIAGNOSIS — F5101 Primary insomnia: Secondary | ICD-10-CM | POA: Diagnosis not present

## 2024-03-07 DIAGNOSIS — Z7189 Other specified counseling: Secondary | ICD-10-CM

## 2024-03-07 DIAGNOSIS — M1612 Unilateral primary osteoarthritis, left hip: Secondary | ICD-10-CM | POA: Diagnosis not present

## 2024-03-07 MED ORDER — TRAZODONE HCL 50 MG PO TABS: 50.0000 mg | ORAL_TABLET | Freq: Every evening | ORAL | 3 refills | Status: DC | PRN

## 2024-03-07 NOTE — Telephone Encounter (Signed)
 Has video visit today, we are not able to prescribe since ortho is managing pain

## 2024-03-07 NOTE — Progress Notes (Signed)
 This service is provided via telemedicine  No vital signs collected/recorded due to the encounter was a telemedicine visit.   Location of patient (ex: home, work):  Home   Patient consents to a telephone visit:  Yes, 12/07/2023  Location of the provider (ex: office, home):  [facility]  Name of any referring provider:  [name], NP   Names of all persons participating in the telemedicine service and their role in the encounter: Naphtali Zywicki B/CMA, Jereld Berneda Delude, NP , and patient  Time spent on call:  11 minutes

## 2024-03-07 NOTE — Progress Notes (Signed)
 Virtual Visit via Video Note  I connected with Brenda Mcfarland on 03/14/24 at  1:00 PM EDT by a video enabled telemedicine application and verified that I am speaking with the correct person using two identifiers.  Patient Location: Home Provider Location: Office/Clinic  I discussed the limitations, risks, security, and privacy concerns of performing an evaluation and management service by video and the availability of in person appointments. I also discussed with the patient that there may be a patient responsible charge related to this service. The patient expressed understanding and agreed to proceed.  Subjective: PCP: [name], NP  Chief Complaint  Patient presents with   Letter for School/Work    Complete advanced care paperwork-[phone] // discuss sleep medication, Patient daughter stated that her mother can start Cymbalta  on Sunday and will stop the Celebrex due to her surgery.    Discussed the use of AI scribe software for clinical note transcription with the patient, who gave verbal consent to proceed.  HPI  Brenda Mcfarland is a 78 year old female who had a video visit for advanced care planning and sleep medication management. She is joined in the meeting by her daughter, Brenda Mcfarland.  She is here to complete advanced care planning paperwork, including healthcare directives and a DNR. She does not have a medical advance directive or a mental health advance directive and is seeking information on creating these documents. She wishes to appoint her daughter, Brenda Mcfarland, as her healthcare agent with full authority over her medical decisions. She has specific wishes regarding life-prolonging treatments, CPR, and organ donation.  She is scheduled for a left hip replacement surgery on March 20, 2024, and anticipates a stay in a skilled nursing facility post-surgery. Her current pain management includes hydrocodone -acetaminophen  10 mg/325 mg every  six hours as needed due to extreme pain, particularly at night.  She experiences significant sleep disturbances, sleeping only one to two hours at a time due to pain. She currently takes melatonin 5 mg, one to two tablets at bedtime, which helps her relax but does not significantly improve her sleep duration. She is seeking a stronger sleep aid to manage her insomnia, as her current regimen is insufficient.  Her past medical history includes hypertension, which is well-controlled with losartan  50 mg daily and amlodipine  5 mg daily, with recent blood pressure readings around 130/80 mmHg. She also has a history of using Cymbalta  for pain management, which she had stopped but reported that her surgical team told her she could restart. No current use of Ambien due to a past history of sleepwalking.       ROS: Per HPI  Current Outpatient Medications:    acetaminophen  (TYLENOL ) 650 MG CR tablet, Take 650 mg by mouth every 8 (eight) hours as needed for pain., Disp: , Rfl:    amLODipine  (NORVASC ) 5 MG tablet, Take 1 tablet (5 mg total) by mouth daily., Disp: 90 tablet, Rfl: 3   celecoxib (CELEBREX) 100 MG capsule, Take 100 mg by mouth., Disp: , Rfl:    Continuous Glucose Receiver (FREESTYLE LIBRE 3 READER) DEVI, 1 Device by Does not apply route daily. E11.69, Disp: 1 each, Rfl: 12   Continuous Glucose Sensor (FREESTYLE LIBRE 3 SENSOR) MISC, 1 Device by Does not apply route every 14 (fourteen) days. Place 1 sensor on the skin every 14 days. Use to check glucose continuously E11.69, Disp: 2 each, Rfl: 12   HYDROcodone -acetaminophen  (NORCO) 10-325 MG tablet, Take 1 tablet by mouth every  6 (six) hours as needed., Disp: , Rfl:    latanoprost (XALATAN) 0.005 % ophthalmic solution, Place 1 drop into both eyes at bedtime., Disp: , Rfl:    losartan  (COZAAR ) 50 MG tablet, TAKE ONE TABLET BY MOUTH ONE TIME DAILY, Disp: 90 tablet, Rfl: 1   MAGNESIUM GLYCINATE PO, Take 350 mg by mouth., Disp: , Rfl:    melatonin 5 MG  TABS, Take by mouth at bedtime. 1-2 tablets, Disp: , Rfl:    OVER THE COUNTER MEDICATION, daily. Essential Oils as needed, Disp: , Rfl:    polyethylene glycol (MIRALAX / GLYCOLAX) 17 g packet, Take 17 g by mouth as needed., Disp: , Rfl:    traZODone  (DESYREL ) 50 MG tablet, Take 1 tablet (50 mg total) by mouth at bedtime as needed for sleep., Disp: 30 tablet, Rfl: 3  Observations/Objective: There were no vitals filed for this visit. Physical Exam  Assessment and Plan:  1. Primary osteoarthritis of left hip (Primary) -  Scheduled for left hip replacement on July 21 to alleviate pain and improve quality of life. - Proceed with left hip replacement surgery on July 21. - Plan for potential rehabilitation in a skilled nursing facility post-surgery.  2. Primary insomnia -  Severe insomnia due to osteoarthritis pain. Melatonin ineffective. Allergic to Ambien. Discussed risks of combining sleep medication with hydrocodone . Recommended trazodone  50 mg. - Prescribe trazodone  50 mg for insomnia. - Advise to take trazodone  at least one hour apart from hydrocodone . - traZODone  (DESYREL ) 50 MG tablet; Take 1 tablet (50 mg total) by mouth at bedtime as needed for sleep.  Dispense: 30 tablet; Refill: 3  3. Counseling regarding advance directives and goals of care -  Discussed completing advanced care paperwork. Expressed wishes for no CPR or life-prolonging treatments if incapacitated. Designated daughter as Advertising account executive. - Complete advanced care paperwork including living will, DNR, and healthcare directives. - Designate Brenda Mcfarland as healthcare agent with full authority. - Document wishes for no CPR or life-prolonging treatments if incapacitated. - Document desire for a celebration of life and organ donation.  4. Primary hypertension -  Well-controlled with losartan  and amlodipine . Blood pressure typically 130/80 mmHg. - Continue losartan  50 mg daily. - Continue amlodipine  5  mg daily.     Follow Up Instructions: Return if symptoms worsen or fail to improve.   I discussed the assessment and treatment plan with the patient. The patient was provided an opportunity to ask questions, and all were answered. The patient agreed with the plan and demonstrated an understanding of the instructions.   The patient was advised to call back or seek an in-person evaluation if the symptoms worsen or if the condition fails to improve as anticipated.  The above assessment and management plan was discussed with the patient. The patient verbalized understanding of and has agreed to the management plan.   Yumi Insalaco Medina-Vargas, NP

## 2024-03-08 NOTE — Telephone Encounter (Signed)
 Discussed pain medication contract which she does have and daughter will call the pain clinic for refill.

## 2024-03-13 ENCOUNTER — Encounter: Payer: Self-pay | Admitting: Nurse Practitioner

## 2024-03-20 DIAGNOSIS — E669 Obesity, unspecified: Secondary | ICD-10-CM | POA: Diagnosis not present

## 2024-03-20 DIAGNOSIS — R7303 Prediabetes: Secondary | ICD-10-CM | POA: Diagnosis not present

## 2024-03-20 DIAGNOSIS — Z6839 Body mass index (BMI) 39.0-39.9, adult: Secondary | ICD-10-CM | POA: Diagnosis not present

## 2024-03-20 DIAGNOSIS — I10 Essential (primary) hypertension: Secondary | ICD-10-CM | POA: Diagnosis not present

## 2024-03-20 DIAGNOSIS — M1612 Unilateral primary osteoarthritis, left hip: Secondary | ICD-10-CM | POA: Diagnosis not present

## 2024-03-20 HISTORY — PX: TOTAL HIP ARTHROPLASTY: SHX124

## 2024-03-24 DIAGNOSIS — Z7401 Bed confinement status: Secondary | ICD-10-CM | POA: Diagnosis not present

## 2024-03-24 DIAGNOSIS — Z96642 Presence of left artificial hip joint: Secondary | ICD-10-CM | POA: Diagnosis not present

## 2024-03-24 DIAGNOSIS — F4320 Adjustment disorder, unspecified: Secondary | ICD-10-CM | POA: Diagnosis not present

## 2024-03-24 DIAGNOSIS — G2581 Restless legs syndrome: Secondary | ICD-10-CM | POA: Diagnosis not present

## 2024-03-24 DIAGNOSIS — F411 Generalized anxiety disorder: Secondary | ICD-10-CM | POA: Diagnosis not present

## 2024-03-24 DIAGNOSIS — G894 Chronic pain syndrome: Secondary | ICD-10-CM | POA: Diagnosis not present

## 2024-03-24 DIAGNOSIS — R5381 Other malaise: Secondary | ICD-10-CM | POA: Diagnosis not present

## 2024-03-24 DIAGNOSIS — M1612 Unilateral primary osteoarthritis, left hip: Secondary | ICD-10-CM | POA: Diagnosis not present

## 2024-03-24 DIAGNOSIS — R52 Pain, unspecified: Secondary | ICD-10-CM | POA: Diagnosis not present

## 2024-03-24 DIAGNOSIS — H409 Unspecified glaucoma: Secondary | ICD-10-CM | POA: Diagnosis not present

## 2024-03-24 DIAGNOSIS — F413 Other mixed anxiety disorders: Secondary | ICD-10-CM | POA: Diagnosis not present

## 2024-03-24 DIAGNOSIS — G4700 Insomnia, unspecified: Secondary | ICD-10-CM | POA: Diagnosis not present

## 2024-03-24 DIAGNOSIS — Z471 Aftercare following joint replacement surgery: Secondary | ICD-10-CM | POA: Diagnosis not present

## 2024-03-24 DIAGNOSIS — M1990 Unspecified osteoarthritis, unspecified site: Secondary | ICD-10-CM | POA: Diagnosis not present

## 2024-03-24 DIAGNOSIS — F5101 Primary insomnia: Secondary | ICD-10-CM | POA: Diagnosis not present

## 2024-03-24 DIAGNOSIS — I10 Essential (primary) hypertension: Secondary | ICD-10-CM | POA: Diagnosis not present

## 2024-03-27 DIAGNOSIS — G2581 Restless legs syndrome: Secondary | ICD-10-CM | POA: Diagnosis not present

## 2024-03-27 DIAGNOSIS — M1612 Unilateral primary osteoarthritis, left hip: Secondary | ICD-10-CM | POA: Diagnosis not present

## 2024-03-27 DIAGNOSIS — R5381 Other malaise: Secondary | ICD-10-CM | POA: Diagnosis not present

## 2024-03-27 DIAGNOSIS — F5101 Primary insomnia: Secondary | ICD-10-CM | POA: Diagnosis not present

## 2024-03-27 DIAGNOSIS — G894 Chronic pain syndrome: Secondary | ICD-10-CM | POA: Diagnosis not present

## 2024-03-27 DIAGNOSIS — I10 Essential (primary) hypertension: Secondary | ICD-10-CM | POA: Diagnosis not present

## 2024-03-29 ENCOUNTER — Other Ambulatory Visit: Payer: Self-pay | Admitting: Adult Health

## 2024-03-29 DIAGNOSIS — I10 Essential (primary) hypertension: Secondary | ICD-10-CM | POA: Diagnosis not present

## 2024-03-29 DIAGNOSIS — Z96642 Presence of left artificial hip joint: Secondary | ICD-10-CM | POA: Diagnosis not present

## 2024-03-29 DIAGNOSIS — F5101 Primary insomnia: Secondary | ICD-10-CM

## 2024-03-29 DIAGNOSIS — F411 Generalized anxiety disorder: Secondary | ICD-10-CM | POA: Diagnosis not present

## 2024-03-29 NOTE — Telephone Encounter (Signed)
 Pharmacy comment: REQUEST FOR 90 DAYS PRESCRIPTION.

## 2024-03-30 DIAGNOSIS — G894 Chronic pain syndrome: Secondary | ICD-10-CM | POA: Diagnosis not present

## 2024-03-30 DIAGNOSIS — Z96642 Presence of left artificial hip joint: Secondary | ICD-10-CM | POA: Diagnosis not present

## 2024-03-30 DIAGNOSIS — M1612 Unilateral primary osteoarthritis, left hip: Secondary | ICD-10-CM | POA: Diagnosis not present

## 2024-03-30 DIAGNOSIS — R5381 Other malaise: Secondary | ICD-10-CM | POA: Diagnosis not present

## 2024-03-31 DIAGNOSIS — R5381 Other malaise: Secondary | ICD-10-CM | POA: Diagnosis not present

## 2024-03-31 DIAGNOSIS — I10 Essential (primary) hypertension: Secondary | ICD-10-CM | POA: Diagnosis not present

## 2024-03-31 DIAGNOSIS — M1612 Unilateral primary osteoarthritis, left hip: Secondary | ICD-10-CM | POA: Diagnosis not present

## 2024-03-31 DIAGNOSIS — G2581 Restless legs syndrome: Secondary | ICD-10-CM | POA: Diagnosis not present

## 2024-03-31 DIAGNOSIS — G894 Chronic pain syndrome: Secondary | ICD-10-CM | POA: Diagnosis not present

## 2024-03-31 DIAGNOSIS — F5101 Primary insomnia: Secondary | ICD-10-CM | POA: Diagnosis not present

## 2024-03-31 DIAGNOSIS — M25552 Pain in left hip: Secondary | ICD-10-CM | POA: Diagnosis not present

## 2024-03-31 DIAGNOSIS — R52 Pain, unspecified: Secondary | ICD-10-CM | POA: Diagnosis not present

## 2024-04-03 DIAGNOSIS — M1612 Unilateral primary osteoarthritis, left hip: Secondary | ICD-10-CM | POA: Diagnosis not present

## 2024-04-03 DIAGNOSIS — R5381 Other malaise: Secondary | ICD-10-CM | POA: Diagnosis not present

## 2024-04-03 DIAGNOSIS — F5101 Primary insomnia: Secondary | ICD-10-CM | POA: Diagnosis not present

## 2024-04-03 DIAGNOSIS — I10 Essential (primary) hypertension: Secondary | ICD-10-CM | POA: Diagnosis not present

## 2024-04-03 DIAGNOSIS — G894 Chronic pain syndrome: Secondary | ICD-10-CM | POA: Diagnosis not present

## 2024-04-03 DIAGNOSIS — G2581 Restless legs syndrome: Secondary | ICD-10-CM | POA: Diagnosis not present

## 2024-04-06 ENCOUNTER — Telehealth: Payer: Self-pay

## 2024-04-06 NOTE — Telephone Encounter (Signed)
 Typically the facility sets up a follow up appt with us  and she will see us  in 1-2 weeks of discharge from facility.

## 2024-04-06 NOTE — Telephone Encounter (Signed)
 Spoke with Delon and she verbalized understanding of [place], Harlene POUR, NP response. Delon stated her message was taken incorrectly as she was not requesting a letter, it was more of a FYI that patient will discharge soon and care to be resumed by Harlene

## 2024-04-06 NOTE — Telephone Encounter (Signed)
 Copied from CRM [id number]. Topic: General - Other >> Apr 06, 2024 12:10 PM Cherylann RAMAN wrote: Reason for CRM: Delon called and stated that the patient will be discharged soon from rehab. She is needing a letter stating that provider Harlene An will resume care for the medication management such as managing refills for patient. All information provided is just providing awareness to the patient's current situation and next steps per facility

## 2024-04-07 DIAGNOSIS — F5101 Primary insomnia: Secondary | ICD-10-CM | POA: Diagnosis not present

## 2024-04-07 DIAGNOSIS — G2581 Restless legs syndrome: Secondary | ICD-10-CM | POA: Diagnosis not present

## 2024-04-07 DIAGNOSIS — R5381 Other malaise: Secondary | ICD-10-CM | POA: Diagnosis not present

## 2024-04-07 DIAGNOSIS — M1612 Unilateral primary osteoarthritis, left hip: Secondary | ICD-10-CM | POA: Diagnosis not present

## 2024-04-07 DIAGNOSIS — G894 Chronic pain syndrome: Secondary | ICD-10-CM | POA: Diagnosis not present

## 2024-04-07 DIAGNOSIS — I10 Essential (primary) hypertension: Secondary | ICD-10-CM | POA: Diagnosis not present

## 2024-04-10 ENCOUNTER — Encounter: Payer: Self-pay | Admitting: Nurse Practitioner

## 2024-04-10 DIAGNOSIS — G894 Chronic pain syndrome: Secondary | ICD-10-CM | POA: Diagnosis not present

## 2024-04-10 DIAGNOSIS — E119 Type 2 diabetes mellitus without complications: Secondary | ICD-10-CM | POA: Diagnosis not present

## 2024-04-10 DIAGNOSIS — G4709 Other insomnia: Secondary | ICD-10-CM | POA: Diagnosis not present

## 2024-04-10 DIAGNOSIS — Z471 Aftercare following joint replacement surgery: Secondary | ICD-10-CM | POA: Diagnosis not present

## 2024-04-10 DIAGNOSIS — I4891 Unspecified atrial fibrillation: Secondary | ICD-10-CM | POA: Diagnosis not present

## 2024-04-10 DIAGNOSIS — E7800 Pure hypercholesterolemia, unspecified: Secondary | ICD-10-CM | POA: Diagnosis not present

## 2024-04-10 DIAGNOSIS — F324 Major depressive disorder, single episode, in partial remission: Secondary | ICD-10-CM | POA: Diagnosis not present

## 2024-04-10 DIAGNOSIS — G4733 Obstructive sleep apnea (adult) (pediatric): Secondary | ICD-10-CM | POA: Diagnosis not present

## 2024-04-10 DIAGNOSIS — M4712 Other spondylosis with myelopathy, cervical region: Secondary | ICD-10-CM | POA: Diagnosis not present

## 2024-04-10 DIAGNOSIS — Z96642 Presence of left artificial hip joint: Secondary | ICD-10-CM | POA: Diagnosis not present

## 2024-04-10 DIAGNOSIS — I10 Essential (primary) hypertension: Secondary | ICD-10-CM | POA: Diagnosis not present

## 2024-04-10 DIAGNOSIS — H409 Unspecified glaucoma: Secondary | ICD-10-CM | POA: Diagnosis not present

## 2024-04-11 ENCOUNTER — Telehealth: Payer: Self-pay

## 2024-04-11 ENCOUNTER — Telehealth: Payer: Self-pay | Admitting: Nurse Practitioner

## 2024-04-11 ENCOUNTER — Other Ambulatory Visit: Payer: Self-pay | Admitting: Family

## 2024-04-11 NOTE — Telephone Encounter (Signed)
 Accidentally routed to PCP Caro, Harlene POUR, NP . Rerouting to Roxan Plough, NP. Brenda Mcfarland Patient has pain management and also has Orthopedic specialist for Hydrocodone .

## 2024-04-11 NOTE — Telephone Encounter (Signed)
 Patient daughter has called the office again asking if medication Hydrocodone  will be refilled. She states that she sent message earlier and provider hasn't responded back yet. I have notified her that provider is in room with patients and I did forward her response earlier. However she will have to wait until provider is able to address message because I cannot pull her out of patient room in office to reply to MyChart message. Patient daughter understood. Appointment scheduled for tomorrow 04/12/2024 with Roxan Plough, NP.

## 2024-04-11 NOTE — Telephone Encounter (Signed)
 Copied from CRM [id number]. Topic: Clinical - Medication Refill >> Apr 11, 2024 11:18 AM Corin V wrote: Medication:  10 mg Oxycodone  if you could do that in 5 mg but take 2 for the 10 mg and ween off to 5mg  Patient was discharged from rehab without enough medication. She is coming in for a hospital follow up tomorrow but will be out today.  Has the patient contacted their pharmacy? Yes (Agent: If no, request that the patient contact the pharmacy for the refill. If patient does not wish to contact the pharmacy document the reason why and proceed with request.) (Agent: If yes, when and what did the pharmacy advise?)  This is the patient's preferred pharmacy:  [id number] - [place], [place] - [address] [place] [address] Phone: [phone] Fax: [phone]  Is this the correct pharmacy for this prescription? Yes If no, delete pharmacy and type the correct one.   Has the prescription been filled recently? No  Is the patient out of the medication? Yes  Has the patient been seen for an appointment in the last year OR does the patient have an upcoming appointment? Yes  Can we respond through MyChart? No  Agent: Please be advised that Rx refills may take up to 3 business days. We ask that you follow-up with your pharmacy.

## 2024-04-11 NOTE — Telephone Encounter (Signed)
 Detailed voicemail left for Keri with approval of verbal orders.

## 2024-04-11 NOTE — Telephone Encounter (Signed)
 MyChart messages sent to NP's

## 2024-04-11 NOTE — Telephone Encounter (Signed)
 Message routed to Roxan Plough, NP. Patient response via MyChart.

## 2024-04-11 NOTE — Telephone Encounter (Signed)
 She needs a follow up in office

## 2024-04-11 NOTE — Telephone Encounter (Signed)
 Copied from CRM [id number]. Topic: Clinical - Home Health Verbal Orders >> Apr 10, 2024  5:02 PM Mercer PEDLAR wrote: Caller/Agency: [facility]  Callback Number: [phone] Service Requested: Physical Therapy Frequency: 1 week 9 effective 04/10/24 Any new concerns about the patient? No

## 2024-04-11 NOTE — Telephone Encounter (Signed)
 Patient request: Medication:  10 mg Oxycodone  if you could do that in 5 mg but take 2 for the 10 mg and ween off to 5mg  Patient was discharged from rehab without enough medication. She is coming in for a hospital follow up tomorrow but will be out today.

## 2024-04-11 NOTE — Telephone Encounter (Addendum)
 Patient daughter called and states that mother is out of medication Oxycodone  and needs more. She states that she was going to [facility] but was discharged and they didn't give her enough medicine to follow up with PCP Caro, Harlene POUR, NP . Patient has appointment tomorrow with Roxan Plough, NP for hospital follow up. Patient daughter states that she needs something for today to last her until tomorrow. I advised patient daughter that I will send message to Nurse Practitioner who will see her mother tomorrow to see what she recommends. I also advised daughter to bring all medications/controlled medication to appointment tomorrow for review. If anything can be sent into pharmacy patient daughter [facility] [address]. Message routed to Roxan Plough, NP. Please advise.

## 2024-04-11 NOTE — Telephone Encounter (Signed)
 Message routed to PCP Caro, Harlene POUR, NP

## 2024-04-11 NOTE — Telephone Encounter (Signed)
 Noted. Patient last in office visit with PCP dated 01/10/2024. Message routed to [facility] staff to schedule patient for follow up.

## 2024-04-12 ENCOUNTER — Encounter: Payer: Self-pay | Admitting: Family

## 2024-04-12 ENCOUNTER — Ambulatory Visit: Admitting: Family

## 2024-04-12 VITALS — BP 128/76 | HR 84 | Temp 97.6°F | Resp 20 | Ht 67.0 in | Wt 224.0 lb

## 2024-04-12 DIAGNOSIS — F5101 Primary insomnia: Secondary | ICD-10-CM | POA: Diagnosis not present

## 2024-04-12 DIAGNOSIS — M25552 Pain in left hip: Secondary | ICD-10-CM

## 2024-04-12 DIAGNOSIS — G2581 Restless legs syndrome: Secondary | ICD-10-CM | POA: Diagnosis not present

## 2024-04-12 DIAGNOSIS — I10 Essential (primary) hypertension: Secondary | ICD-10-CM | POA: Diagnosis not present

## 2024-04-12 DIAGNOSIS — E119 Type 2 diabetes mellitus without complications: Secondary | ICD-10-CM | POA: Diagnosis not present

## 2024-04-12 DIAGNOSIS — R600 Localized edema: Secondary | ICD-10-CM

## 2024-04-12 MED ORDER — TORSEMIDE 40 MG PO TABS: 40.0000 mg | ORAL_TABLET | ORAL | 1 refills | Status: DC

## 2024-04-12 MED ORDER — TRAZODONE HCL 50 MG PO TABS: 50.0000 mg | ORAL_TABLET | Freq: Every evening | ORAL | 2 refills | Status: AC | PRN

## 2024-04-12 MED ORDER — OXYCODONE HCL 10 MG PO TABS: 10.0000 mg | ORAL_TABLET | Freq: Four times a day (QID) | ORAL | 0 refills | Status: DC | PRN

## 2024-04-12 MED ORDER — ROPINIROLE HCL 0.5 MG PO TABS: 0.5000 mg | ORAL_TABLET | Freq: Every day | ORAL | 0 refills | Status: DC

## 2024-04-12 NOTE — Progress Notes (Signed)
 Provider: Georgie Eduardo FNP-C   [name], NP  Patient Care Team: [name], NP as PCP - General (Geriatric Medicine) Tobb, Kardie, DO as PCP - Cardiology (Cardiology) Evern Donnajean Kayser, MD as Referring Physician (Orthopedic Surgery) Bernie Lamar PARAS, MD as Consulting Physician (Cardiology)  Extended Emergency Contact Information Primary Emergency Contact: Farlow,jennifer Mobile Phone: [phone] Relation: Daughter  Code Status:  DNR Goals of care: Advanced Directive information    03/07/2024    1:05 PM  Advanced Directives  Does Patient Have a Medical Advance Directive? No  Would patient like information on creating a medical advance directive? Yes (MAU/Ambulatory/Procedural Areas - Information given)     Chief Complaint  Patient presents with   Transitions Of Care    Discharged from rehab    Discussed the use of AI scribe software for clinical note transcription with the patient, who gave verbal consent to proceed.  History of Present Illness   Brenda Mcfarland is a 78 year old female who presents with pain following a left hip replacement. She is accompanied by her daughter who is her caregiver.  She underwent a left hip replacement and was discharged from rehab on April 07, 2024. She experiences significant pain, rating it as 8 out of 10 on the pain scale. The pain is constant, disrupts her sleep, and requires her to wake up at night to take medication. She is currently taking oxycodone  10 mg every six hours for pain management, having previously used  oxycodone  5 mg and a fentanyl  patch. she was discharged from rehab on oxycodone  5 mg to take 2 tablets every 6 hrs as needed for pain. Prior to her left hip replacement she was on hydrocodone  She wants to eventually wean off the medication.  She reports swelling in her legs, with a history of cellulitis. The swelling has decreased slightly, but she continues to experience tingling in her foot.  She is taking torsemide  40 mg once daily for edema.  She has a history of restless leg syndrome, which was exacerbated by pain during rehab. She was prescribed medication for this condition and inquires about its use if symptoms recur.  Her current medications include lisinopril, losartan , Tylenol , melatonin, magnesium glycinate, Miralax, Celebrex, Robaxin , alprazolam, Requip , trazodone , and Zofran . She uses a chart to manage her medication schedule and is cautious about overlapping medications.  She reports tingling in her foot.   Past Medical History:  Diagnosis Date   Abnormal EKG 10/23/2016   Arthritis 01/18/2020   Chronic fatigue 10/08/2015   Depression 01/18/2020   Diabetes mellitus (HCC) 10/08/2015   Formatting of this note might be different from the original. Overview:  last Hgb A1C 7.2/no blood sugars at home Formatting of this note might be different from the original. last Hgb A1C 7.2/no blood sugars at home   ESR raised 11/27/2016   Frequent urination 12/16/2018   Glaucoma 10/08/2015   High cholesterol    History of pancreatitis 07/11/2014   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/23/2016   Hypertension 10/08/2015   IFG (impaired fasting glucose) 10/24/2019   Incomplete emptying of bladder 11/30/2017   Knee pain, left 12/22/2016   Loss of memory 01/18/2020   Major depressive disorder in partial remission (HCC) 10/08/2015   Morbid obesity (HCC) 10/23/2016   Obesity (BMI 30-39.9) 11/10/2016   Obstructive sleep apnea 12/16/2018   Osteoarthritis of both knees 11/27/2016   Other insomnia 03/05/2017   Palpitations 10/23/2016   Positive ANA (antinuclear antibody) 10/23/2016   Sleep apnea 10/08/2015  Formatting of this note might be different from the original. Overview:  does not use CPAP Formatting of this note might be different from the original. does not use CPAP   Sore neck 10/24/2019   Spondylosis of cervical spine with myelopathy 01/18/2020   Type 2 diabetes mellitus without complication, without long-term current  use of insulin (HCC) 12/16/2018   Vitamin D  deficiency 10/08/2015   Past Surgical History:  Procedure Laterality Date   APPENDECTOMY     age 68   CESAREAN SECTION     X 3   NECK SURGERY  2015   OTHER SURGICAL HISTORY  08/16/2023   Spinal injection   SPINE SURGERY  05/29/2015   TOTAL HIP ARTHROPLASTY  03/20/2024    Allergies  Allergen Reactions   Amlodipine  Swelling   Aripiprazole Other (See Comments)    Tongue twisted, lips curled up Tongue twisted, lips curled up    Carvedilol Other (See Comments)    Nightmares Nightmares    Hydralazine      Diarrhea, abdominal pain   Irbesartan      dizziness   Meclizine  Other (See Comments)    Dizziness    Paroxetine Hcl Other (See Comments)    . SABRA    Tizanidine Other (See Comments)    Syncope Syncope    Tramadol Other (See Comments)    Unknown   Zolpidem Other (See Comments)    Sleepwalking Sleepwalking    Zyrtec [Cetirizine Hcl]     Dizziness    Allergies as of 04/12/2024       Reactions   Amlodipine  Swelling   Aripiprazole Other (See Comments)   Tongue twisted, lips curled up Tongue twisted, lips curled up   Carvedilol Other (See Comments)   Nightmares Nightmares   Hydralazine     Diarrhea, abdominal pain   Irbesartan     dizziness   Meclizine  Other (See Comments)   Dizziness    Paroxetine Hcl Other (See Comments)   . SABRA   Tizanidine Other (See Comments)   Syncope Syncope   Tramadol Other (See Comments)   Unknown   Zolpidem Other (See Comments)   Sleepwalking Sleepwalking   Zyrtec [cetirizine Hcl]    Dizziness        Medication List        Accurate as of April 12, 2024  9:37 PM. If you have any questions, ask your nurse or doctor.          STOP taking these medications    HYDROcodone -acetaminophen  10-325 MG tablet Commonly known as: NORCO Stopped by: Caedin Mogan C Farid Grigorian       TAKE these medications    acetaminophen  650 MG CR tablet Commonly known as: TYLENOL  Take 650 mg by mouth  every 8 (eight) hours as needed for pain.   TYLENOL  500 MG tablet Generic drug: acetaminophen  Take 500 mg by mouth as needed.   amLODipine  5 MG tablet Commonly known as: NORVASC  Take 1 tablet (5 mg total) by mouth daily.   CeleBREX 100 MG capsule Generic drug: celecoxib Take 100 mg by mouth 2 (two) times daily.   FreeStyle Libre 3 Reader Devi 1 Device by Does not apply route daily. E11.69   FreeStyle Libre 3 Sensor Misc 1 Device by Does not apply route every 14 (fourteen) days. Place 1 sensor on the skin every 14 days. Use to check glucose continuously E11.69   latanoprost 0.005 % ophthalmic solution Commonly known as: XALATAN Place 1 drop into both eyes at bedtime.   losartan  50 MG  tablet Commonly known as: COZAAR  TAKE ONE TABLET BY MOUTH ONE TIME DAILY   MAGNESIUM GLYCINATE PO Take 350 mg by mouth.   melatonin 5 MG Tabs Take by mouth at bedtime. 1-2 tablets   methocarbamol  500 MG tablet Commonly known as: ROBAXIN  Take 500 mg by mouth every 8 (eight) hours as needed.   ondansetron  4 MG tablet Commonly known as: ZOFRAN  Take 4 mg by mouth every 8 (eight) hours as needed for nausea or vomiting.   OVER THE COUNTER MEDICATION daily. Essential Oils as needed   Oxycodone  HCl 10 MG Tabs Take 1 tablet (10 mg total) by mouth every 6 (six) hours as needed.   polyethylene glycol 17 g packet Commonly known as: MIRALAX / GLYCOLAX Take 17 g by mouth as needed.   PROBIOTIC DAILY PO Take by mouth daily.   rOPINIRole  0.5 MG tablet Commonly known as: REQUIP  Take 1 tablet (0.5 mg total) by mouth at bedtime.   Torsemide  40 MG Tabs Take 40 mg by mouth every morning. Take extra 40 mg tablet at 2 pm x 3 days then resume previous one tablet. What changed: additional instructions Changed by: Yuleni Burich C Omarii Scalzo   traZODone  50 MG tablet Commonly known as: DESYREL  Take 1 tablet (50 mg total) by mouth at bedtime as needed. for sleep   Xanax 0.25 MG tablet Generic drug:  ALPRAZolam Take 0.25 mg by mouth as needed.        Review of Systems  Constitutional:  Negative for appetite change, chills, fatigue, fever and unexpected weight change.  HENT:  Negative for congestion, dental problem, ear discharge, ear pain, facial swelling, hearing loss, nosebleeds, postnasal drip, rhinorrhea, sinus pressure, sinus pain, sneezing, sore throat, tinnitus and trouble swallowing.   Eyes:  Negative for pain, discharge, redness, itching and visual disturbance.  Respiratory:  Negative for cough, chest tightness, shortness of breath and wheezing.   Cardiovascular:  Negative for chest pain, palpitations and leg swelling.  Gastrointestinal:  Negative for abdominal distention, abdominal pain, constipation, diarrhea, nausea and vomiting.  Endocrine: Negative for cold intolerance, heat intolerance, polydipsia, polyphagia and polyuria.  Genitourinary:  Negative for difficulty urinating, dysuria, flank pain, frequency and urgency.  Musculoskeletal:  Positive for arthralgias and gait problem. Negative for back pain, joint swelling, myalgias, neck pain and neck stiffness.       Left hip pain   Skin:  Negative for color change, pallor, rash and wound.  Neurological:  Negative for dizziness, syncope, speech difficulty, weakness, light-headedness, numbness and headaches.  Hematological:  Does not bruise/bleed easily.  Psychiatric/Behavioral:  Negative for agitation, behavioral problems, confusion, hallucinations and sleep disturbance. The patient is not nervous/anxious.     Immunization History  Administered Date(s) Administered   PPD Test 03/24/2024, 04/02/2024   Tdap 06/30/2018   Pertinent  Health Maintenance Due  Topic Date Due   DEXA SCAN  Never done   INFLUENZA VACCINE  03/31/2024   OPHTHALMOLOGY EXAM  04/08/2024   HEMOGLOBIN A1C  07/12/2024   FOOT EXAM  01/09/2025      09/17/2023    2:11 PM 12/07/2023    9:25 AM 12/28/2023   10:18 AM 01/10/2024    1:32 PM 03/07/2024   11:27  AM  Fall Risk  Falls in the past year? 0 0 0 0 0  Was there an injury with Fall? 0 0 0 0 0  Fall Risk Category Calculator 0 0 0 0 0  Patient at Risk for Falls Due to No Fall Risks No Fall  Risks Impaired mobility No Fall Risks No Fall Risks  Fall risk Follow up Falls evaluation completed;Education provided;Falls prevention discussed Falls evaluation completed Falls evaluation completed Falls prevention discussed;Falls evaluation completed Falls evaluation completed;Education provided   Functional Status Survey:    Vitals:   04/12/24 1419  BP: 128/76  Pulse: 84  Resp: 20  Temp: 97.6 F (36.4 C)  SpO2: 97%  Weight: 224 lb (101.6 kg)  Height: 5' 7 (1.702 m)   Body mass index is 35.08 kg/m. Physical Exam  MEASUREMENTS: Weight- 216. GENERAL: Alert, cooperative, well developed, no acute distress HEENT: Normocephalic, normal oropharynx, moist mucous membranes CHEST: Clear to auscultation bilaterally, no wheezes, rhonchi, or crackles CARDIOVASCULAR: Normal heart rate and rhythm, S1 and S2 normal without murmurs ABDOMEN: Soft, non-tender, non-distended, without organomegaly, normal bowel sounds EXTREMITIES: Bilateral leg swelling, legs not warm to touch, no infection in legs NEUROLOGICAL: Cranial nerves grossly intact, moves all extremities without gross motor or sensory deficit  SKIN: No rash,no lesion or erythema   PSYCHIATRY/BEHAVIORAL: Mood stable   Labs reviewed: Recent Labs    01/10/24 1417  NA 139  K 3.9  CL 103  CO2 28  GLUCOSE 121  BUN 22  CREATININE 0.65  CALCIUM 9.1   Recent Labs    01/10/24 1417  AST 14  ALT 16  BILITOT 0.4  PROT 6.6   Recent Labs    01/10/24 1417  WBC 6.6  NEUTROABS 4,013  HGB 13.8  HCT 40.1  MCV 93.7  PLT 296   Lab Results  Component Value Date   TSH 2.82 05/01/2022   Lab Results  Component Value Date   HGBA1C 6.6 (H) 01/10/2024   Lab Results  Component Value Date   CHOL 211 (H) 01/10/2024   HDL 56 01/10/2024    LDLCALC 125 (H) 01/10/2024   TRIG 186 (H) 01/10/2024   CHOLHDL 3.8 01/10/2024    Significant Diagnostic Results in last 30 days:  No results found.  Assessment/Plan  Left hip pain after recent left hip replacement Severe left hip pain post-surgery, rated 8/10, constant, disturbing sleep. Currently managed with oxycodone  10 mg every six hours. Previously used fentanyl  patch with oxycodone  5 mg, now discontinued. Aims to wean off oxycodone  gradually. - Prescribe oxycodone  10 mg every six hours as needed for pain. - Sign a new pain management contract for oxycodone . - Follow up in two weeks to reassess pain management and adjust medication as needed.  Bilateral lower extremity edema Bilateral lower extremity edema with history of cellulitis. Edema decreased slightly, no signs of active infection or cellulitis. Currently on torsemide  40 mg once daily. - Increase torsemide  to twice daily for three days to reduce edema. - Advise wearing compression stockings to help manage edema. - Monitor weight daily to assess fluid retention. - Advise reducing salt intake to help manage fluid retention.  Restless legs syndrome Restless legs syndrome exacerbated by recent surgery and pain. - Prescribe Requip  0.5 mg at bedtime as needed for restless legs syndrome. - Advise taking Requip  at the onset of symptoms, allowing 30 minutes to 1 hour for effect.  Hypertension Hypertension managed with lisinopril and losartan .  Type 2 diabetes mellitus Type 2 diabetes mellitus. Not using continuous glucose monitoring due to recent surgery and hospitalization. Plan to resume once recovery is more advanced.  Insomnia Insomnia managed with trazodone  50 mg at bedtime. - Continue trazodone  50 mg at bedtime for insomnia.  General Health Maintenance Emphasis on careful monitoring of medication intake to avoid  overlap, especially with Tylenol -containing medications. - Continue using a medication chart to track and  manage medication intake.    Family/ staff Communication: Reviewed plan of care with patient verbalized understanding   Labs/tests ordered:  - CBC with Differential/Platelet - CMP with eGFR([facility])  Next Appointment : Return in about 3 weeks (around 05/03/2024) for left hip pain and lower extremity edema with Harlene Caro PIETY.   Spent 31 minutes of Face to face and non-face to face with patient  >50% time spent counseling; reviewing medical record; tests; labs; documentation and developing future plan of care.   Roxan JAYSON Plough, NP

## 2024-04-13 LAB — CBC WITH DIFFERENTIAL/PLATELET
Absolute Lymphocytes: 1600 {cells}/uL (ref 850–3900)
Absolute Monocytes: 691 {cells}/uL (ref 200–950)
Basophils Absolute: 32 {cells}/uL (ref 0–200)
Basophils Relative: 0.5 %
Eosinophils Absolute: 102 {cells}/uL (ref 15–500)
Eosinophils Relative: 1.6 %
HCT: 37.2 % (ref 35.0–45.0)
Hemoglobin: 12.4 g/dL (ref 11.7–15.5)
MCH: 33 pg (ref 27.0–33.0)
MCHC: 33.3 g/dL (ref 32.0–36.0)
MCV: 98.9 fL (ref 80.0–100.0)
MPV: 10 fL (ref 7.5–12.5)
Monocytes Relative: 10.8 %
Neutro Abs: 3974 {cells}/uL (ref 1500–7800)
Neutrophils Relative %: 62.1 %
Platelets: 355 Thousand/uL (ref 140–400)
RBC: 3.76 Million/uL — ABNORMAL LOW (ref 3.80–5.10)
RDW: 12.7 % (ref 11.0–15.0)
Total Lymphocyte: 25 %
WBC: 6.4 Thousand/uL (ref 3.8–10.8)

## 2024-04-13 LAB — COMPREHENSIVE METABOLIC PANEL WITH GFR
AG Ratio: 1.6 (calc) (ref 1.0–2.5)
ALT: 17 U/L (ref 6–29)
AST: 18 U/L (ref 10–35)
Albumin: 4.4 g/dL (ref 3.6–5.1)
Alkaline phosphatase (APISO): 82 U/L (ref 37–153)
BUN: 24 mg/dL (ref 7–25)
CO2: 28 mmol/L (ref 20–32)
Calcium: 9.2 mg/dL (ref 8.6–10.4)
Chloride: 103 mmol/L (ref 98–110)
Creat: 0.79 mg/dL (ref 0.60–1.00)
Globulin: 2.8 g/dL (ref 1.9–3.7)
Glucose, Bld: 127 mg/dL — ABNORMAL HIGH (ref 65–99)
Potassium: 4.3 mmol/L (ref 3.5–5.3)
Sodium: 141 mmol/L (ref 135–146)
Total Bilirubin: 0.4 mg/dL (ref 0.2–1.2)
Total Protein: 7.2 g/dL (ref 6.1–8.1)
eGFR: 76 mL/min/1.73m2 (ref >=60)

## 2024-04-13 LAB — HEMOGLOBIN A1C
Hgb A1c MFr Bld: 6.2 % — ABNORMAL HIGH (ref <5.7)
Mean Plasma Glucose: 131 mg/dL
eAG (mmol/L): 7.3 mmol/L

## 2024-04-14 ENCOUNTER — Ambulatory Visit: Payer: Self-pay | Admitting: Family

## 2024-04-17 ENCOUNTER — Other Ambulatory Visit: Payer: Self-pay | Admitting: Family

## 2024-04-17 DIAGNOSIS — G2581 Restless legs syndrome: Secondary | ICD-10-CM

## 2024-04-17 NOTE — Telephone Encounter (Signed)
 Dinah had recommended follow up for her- she needs to set up appt for follow up with dinah or myself this week or early next

## 2024-04-17 NOTE — Telephone Encounter (Signed)
 RX approved on 04/12/24 (5 days ago)

## 2024-04-18 ENCOUNTER — Telehealth: Payer: Self-pay

## 2024-04-18 ENCOUNTER — Other Ambulatory Visit: Payer: Self-pay | Admitting: Nurse Practitioner

## 2024-04-18 NOTE — Telephone Encounter (Signed)
 I have called patient daughter back and notified her of labs results.

## 2024-04-18 NOTE — Telephone Encounter (Unsigned)
 Copied from CRM [id number]. Topic: Clinical - Medication Refill >> Apr 18, 2024  3:43 PM Susanna ORN wrote: Medication: methocarbamol  (ROBAXIN ) 500 MG tablet  Has the patient contacted their pharmacy? No (Agent: If no, request that the patient contact the pharmacy for the refill. If patient does not wish to contact the pharmacy document the reason why and proceed with request.) (Agent: If yes, when and what did the pharmacy advise?)  This is the patient's preferred pharmacy:  [id number] - [place], [place] - [address] [place] [address] Phone: [phone] Fax: [phone]  Is this the correct pharmacy for this prescription? Yes If no, delete pharmacy and type the correct one.   Has the prescription been filled recently? Yes  Is the patient out of the medication? Yes, will be out of medication tonight & needs it.   Has the patient been seen for an appointment in the last year OR does the patient have an upcoming appointment? Yes  Can we respond through MyChart? Yes  Agent: Please be advised that Rx refills may take up to 3 business days. We ask that you follow-up with your pharmacy.

## 2024-04-18 NOTE — Telephone Encounter (Signed)
 Copied from CRM [id number]. Topic: Clinical - Lab/Test Results >> Apr 18, 2024  2:34 PM Zane F wrote: Reason for CRM:   Patient's daughter, Delon, called in after missing a call from the office; Specialist contacted [facility] and warm transferred the patient to the CMA who called but was unable to reach them; Patient is expecting a call back to discuss results further.  Callback Number: [phone]

## 2024-04-21 DIAGNOSIS — G4733 Obstructive sleep apnea (adult) (pediatric): Secondary | ICD-10-CM | POA: Diagnosis not present

## 2024-04-21 DIAGNOSIS — Z471 Aftercare following joint replacement surgery: Secondary | ICD-10-CM | POA: Diagnosis not present

## 2024-04-21 DIAGNOSIS — H409 Unspecified glaucoma: Secondary | ICD-10-CM | POA: Diagnosis not present

## 2024-04-21 DIAGNOSIS — E7800 Pure hypercholesterolemia, unspecified: Secondary | ICD-10-CM | POA: Diagnosis not present

## 2024-04-21 DIAGNOSIS — G894 Chronic pain syndrome: Secondary | ICD-10-CM | POA: Diagnosis not present

## 2024-04-21 DIAGNOSIS — G4709 Other insomnia: Secondary | ICD-10-CM | POA: Diagnosis not present

## 2024-04-21 DIAGNOSIS — I2510 Atherosclerotic heart disease of native coronary artery without angina pectoris: Secondary | ICD-10-CM

## 2024-04-21 DIAGNOSIS — F324 Major depressive disorder, single episode, in partial remission: Secondary | ICD-10-CM | POA: Diagnosis not present

## 2024-04-21 DIAGNOSIS — E119 Type 2 diabetes mellitus without complications: Secondary | ICD-10-CM | POA: Diagnosis not present

## 2024-04-21 DIAGNOSIS — Z96642 Presence of left artificial hip joint: Secondary | ICD-10-CM | POA: Diagnosis not present

## 2024-04-21 DIAGNOSIS — M4712 Other spondylosis with myelopathy, cervical region: Secondary | ICD-10-CM | POA: Diagnosis not present

## 2024-04-21 DIAGNOSIS — I4891 Unspecified atrial fibrillation: Secondary | ICD-10-CM

## 2024-04-21 DIAGNOSIS — Z9049 Acquired absence of other specified parts of digestive tract: Secondary | ICD-10-CM

## 2024-04-21 DIAGNOSIS — I10 Essential (primary) hypertension: Secondary | ICD-10-CM | POA: Diagnosis not present

## 2024-04-21 DIAGNOSIS — M48061 Spinal stenosis, lumbar region without neurogenic claudication: Secondary | ICD-10-CM

## 2024-04-27 ENCOUNTER — Encounter: Payer: Self-pay | Admitting: Nurse Practitioner

## 2024-04-28 NOTE — Telephone Encounter (Signed)
 McGill, Alondra to Me  Eubanks, Jessica K, NP (Selected Message) AM    04/28/24  1:10 PM The patient is scheduled for a virtual visit on 09/04.

## 2024-04-28 NOTE — Telephone Encounter (Signed)
 Noted

## 2024-04-28 NOTE — Telephone Encounter (Signed)
 Message routed to [facility] staff, whenever time allows please call patient daughter and schedule her for in office or virtual follow up appointment next week per PCP Caro, Harlene POUR, NP

## 2024-05-02 ENCOUNTER — Other Ambulatory Visit: Payer: Self-pay | Admitting: Family

## 2024-05-02 DIAGNOSIS — G2581 Restless legs syndrome: Secondary | ICD-10-CM

## 2024-05-03 NOTE — Telephone Encounter (Signed)
 Patient medication Requip  just refilled 04/12/2024. Patient has request additional refill for medication. Pend and sent to PCP [name], NP for approval.

## 2024-05-04 ENCOUNTER — Telehealth: Admitting: Nurse Practitioner

## 2024-05-04 DIAGNOSIS — M48061 Spinal stenosis, lumbar region without neurogenic claudication: Secondary | ICD-10-CM | POA: Diagnosis not present

## 2024-05-04 DIAGNOSIS — G2581 Restless legs syndrome: Secondary | ICD-10-CM

## 2024-05-04 DIAGNOSIS — F5101 Primary insomnia: Secondary | ICD-10-CM

## 2024-05-04 DIAGNOSIS — G4700 Insomnia, unspecified: Secondary | ICD-10-CM | POA: Diagnosis not present

## 2024-05-04 DIAGNOSIS — M1612 Unilateral primary osteoarthritis, left hip: Secondary | ICD-10-CM | POA: Diagnosis not present

## 2024-05-04 DIAGNOSIS — F419 Anxiety disorder, unspecified: Secondary | ICD-10-CM | POA: Diagnosis not present

## 2024-05-04 MED ORDER — OXYCODONE HCL 5 MG PO TABS: 2.5000 mg | ORAL_TABLET | Freq: Two times a day (BID) | ORAL | Status: AC | PRN

## 2024-05-04 MED ORDER — DULOXETINE HCL 20 MG PO CPEP
20.0000 mg | ORAL_CAPSULE | Freq: Every day | ORAL | 5 refills | Status: AC
Start: 2024-05-04 — End: ?

## 2024-05-04 MED ORDER — ROPINIROLE HCL 0.5 MG PO TABS: 0.5000 mg | ORAL_TABLET | Freq: Every evening | ORAL | 0 refills | Status: DC | PRN

## 2024-05-04 NOTE — Progress Notes (Signed)
 This service is provided via telemedicine  No vital signs collected/recorded due to the encounter was a telemedicine visit.   Location of patient (ex: home, work):  Home  Patient consents to a telephone visit:  Yes  Location of the provider (ex: office, home):  Office [place].   Name of any referring provider:  na  Names of all persons participating in the telemedicine service and their role in the encounter:  Brenda Mcfarland, Patient, [name], CMA, Harlene An, NP  Time spent on call:  7:11

## 2024-05-04 NOTE — Progress Notes (Signed)
 Careteam: Patient Care Team: Brenda Harlene POUR, NP as PCP - General (Geriatric Medicine) Tobb, Kardie, DO as PCP - Cardiology (Cardiology) Evern Donnajean Kayser, MD as Referring Physician (Orthopedic Surgery) Bernie Lamar PARAS, MD as Consulting Physician (Cardiology)  Advanced Directive information    Allergies  Allergen Reactions   Amlodipine  Swelling   Aripiprazole Other (See Comments)    Tongue twisted, lips curled up Tongue twisted, lips curled up    Carvedilol Other (See Comments)    Nightmares Nightmares    Hydralazine      Diarrhea, abdominal pain   Irbesartan      dizziness   Meclizine  Other (See Comments)    Dizziness    Paroxetine Hcl Other (See Comments)    . SABRA    Tizanidine Other (See Comments)    Syncope Syncope    Tramadol Other (See Comments)    Unknown   Zolpidem Other (See Comments)    Sleepwalking Sleepwalking    Zyrtec [Cetirizine Hcl]     Dizziness    Chief Complaint  Patient presents with   Medication Management    Medication Management.      HPI: Patient is a 78 y.o. female  Discussed the use of AI scribe software for clinical note transcription with the patient, who gave verbal consent to proceed.  History of Present Illness Brenda Mcfarland is a 78 year old female who presents for medication management following a left hip replacement.  She underwent a left hip replacement and is currently managing postoperative pain. She has been taking oxycodone , initially at 10 mg three times a day, but has reduced the dose to 5 mg three times a day. Prior to surgery, she was taking hydrocodone  for pain management. She is also taking Tylenol  as needed for pain.  She experiences dizziness and is concerned that her medications may be contributing to this symptom. She is concerned about the habit-forming nature of oxycodone  and is eager to discontinue its use gradually to avoid withdrawal symptoms. She has not had a recent follow-up with  her surgeon but continues to attend therapy sessions.  She is taking Celebrex (celecoxib) for pain management, although she is unsure of the exact dosing as her daughter manages her medication. She is also taking Miralax to manage constipation associated with her pain medication.  She experiences episodes of restless leg syndrome, which were particularly severe during her rehabilitation. She is currently taking ropinirole  at night to manage these symptoms.  She has a history of dizziness and vertigo and is concerned that her medications may be contributing to these symptoms. She is also taking trazodone  for sleep, which has improved her sleep quality significantly, allowing her to sleep for three hours at a time.  She previously tried duloxetine  (Cymbalta ) but discontinued it due to side effects of dizziness and nausea. She is considering restarting it to help with anxiety and pain management, particularly for pain that radiates from her hip down her leg. She feels like she was actually taking it prior to surgery without side effects.     Review of Systems:  Review of Systems  Constitutional:  Negative for chills, fever and weight loss.  HENT:  Negative for tinnitus.   Respiratory:  Negative for cough, sputum production and shortness of breath.   Cardiovascular:  Negative for chest pain, palpitations and leg swelling.  Gastrointestinal:  Negative for abdominal pain, constipation, diarrhea and heartburn.  Genitourinary:  Negative for dysuria, frequency and urgency.  Musculoskeletal:  Positive for back pain, joint  pain and myalgias. Negative for falls.  Skin: Negative.   Neurological:  Positive for dizziness. Negative for headaches.  Psychiatric/Behavioral:  Negative for depression and memory loss. The patient does not have insomnia.     Past Medical History:  Diagnosis Date   Abnormal EKG 10/23/2016   Arthritis 01/18/2020   Chronic fatigue 10/08/2015   Depression 01/18/2020   Diabetes  mellitus (HCC) 10/08/2015   Formatting of this note might be different from the original. Overview:  last Hgb A1C 7.2/no blood sugars at home Formatting of this note might be different from the original. last Hgb A1C 7.2/no blood sugars at home   ESR raised 11/27/2016   Frequent urination 12/16/2018   Glaucoma 10/08/2015   High cholesterol    History of pancreatitis 07/11/2014   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/23/2016   Hypertension 10/08/2015   IFG (impaired fasting glucose) 10/24/2019   Incomplete emptying of bladder 11/30/2017   Knee pain, left 12/22/2016   Loss of memory 01/18/2020   Major depressive disorder in partial remission (HCC) 10/08/2015   Morbid obesity (HCC) 10/23/2016   Obesity (BMI 30-39.9) 11/10/2016   Obstructive sleep apnea 12/16/2018   Osteoarthritis of both knees 11/27/2016   Other insomnia 03/05/2017   Palpitations 10/23/2016   Positive ANA (antinuclear antibody) 10/23/2016   Sleep apnea 10/08/2015   Formatting of this note might be different from the original. Overview:  does not use CPAP Formatting of this note might be different from the original. does not use CPAP   Sore neck 10/24/2019   Spondylosis of cervical spine with myelopathy 01/18/2020   Type 2 diabetes mellitus without complication, without long-term current use of insulin (HCC) 12/16/2018   Vitamin D  deficiency 10/08/2015   Past Surgical History:  Procedure Laterality Date   APPENDECTOMY     age 89   CESAREAN SECTION     X 3   NECK SURGERY  2015   OTHER SURGICAL HISTORY  08/16/2023   Spinal injection   SPINE SURGERY  05/29/2015   TOTAL HIP ARTHROPLASTY  03/20/2024   Social History:   reports that she quit smoking about 52 years ago. Her smoking use included cigarettes. She started smoking about 61 years ago. She has a 9 pack-year smoking history. She has never used smokeless tobacco. She reports that she does not currently use alcohol. She reports that she does not currently use drugs.  Family History  Problem Relation Age of  Onset   Diabetes Mother    Multiple sclerosis Mother    Heart disease Father    Heart Problems Father    Migraines Daughter    Thyroid  disease Daughter    Migraines Daughter     Medications: Patient's Medications  New Prescriptions   OXYCODONE  (OXY IR/ROXICODONE ) 5 MG IMMEDIATE RELEASE TABLET    Take 0.5 tablets (2.5 mg total) by mouth 2 (two) times daily as needed for up to 7 days for severe pain (pain score 7-10).  Previous Medications   ACETAMINOPHEN  (TYLENOL ) 500 MG TABLET    Take 500 mg by mouth as needed.   ACETAMINOPHEN  (TYLENOL ) 650 MG CR TABLET    Take 650 mg by mouth every 8 (eight) hours as needed for pain.   ALPRAZOLAM (XANAX) 0.25 MG TABLET    Take 0.25 mg by mouth as needed.   AMLODIPINE  (NORVASC ) 5 MG TABLET    Take 1 tablet (5 mg total) by mouth daily.   CELEBREX 100 MG CAPSULE    Take 100 mg by mouth 2 (  two) times daily.   CONTINUOUS GLUCOSE RECEIVER (FREESTYLE LIBRE 3 READER) DEVI    1 Device by Does not apply route daily. E11.69   CONTINUOUS GLUCOSE SENSOR (FREESTYLE LIBRE 3 SENSOR) MISC    1 Device by Does not apply route every 14 (fourteen) days. Place 1 sensor on the skin every 14 days. Use to check glucose continuously E11.69   LATANOPROST (XALATAN) 0.005 % OPHTHALMIC SOLUTION    Place 1 drop into both eyes at bedtime.   LOSARTAN  (COZAAR ) 50 MG TABLET    TAKE ONE TABLET BY MOUTH ONE TIME DAILY   MAGNESIUM GLYCINATE PO    Take 350 mg by mouth.   MELATONIN 5 MG TABS    Take by mouth at bedtime. 1-2 tablets   ONDANSETRON  (ZOFRAN ) 4 MG TABLET    Take 4 mg by mouth every 8 (eight) hours as needed for nausea or vomiting.   OVER THE COUNTER MEDICATION    daily. Essential Oils as needed   POLYETHYLENE GLYCOL (MIRALAX / GLYCOLAX) 17 G PACKET    Take 17 g by mouth as needed.   PROBIOTIC PRODUCT (PROBIOTIC DAILY PO)    Take by mouth daily.   TRAZODONE  (DESYREL ) 50 MG TABLET    Take 1 tablet (50 mg total) by mouth at bedtime as needed. for sleep  Modified Medications    Modified Medication Previous Medication   DULOXETINE  (CYMBALTA ) 20 MG CAPSULE DULoxetine  (CYMBALTA ) 20 MG capsule      Take 1 capsule (20 mg total) by mouth daily.    Take 20 mg by mouth daily.   ROPINIROLE  (REQUIP ) 0.5 MG TABLET rOPINIRole  (REQUIP ) 0.5 MG tablet      Take 1 tablet (0.5 mg total) by mouth at bedtime as needed.    Take 1 tablet (0.5 mg total) by mouth at bedtime.  Discontinued Medications   OXYCODONE  HCL 10 MG TABS    Take 1 tablet (10 mg total) by mouth every 6 (six) hours as needed.   TORSEMIDE  40 MG TABS    Take 40 mg by mouth every morning. Take extra 40 mg tablet at 2 pm x 3 days then resume previous one tablet.    Physical Exam:  There were no vitals filed for this visit. There is no height or weight on file to calculate BMI. Wt Readings from Last 3 Encounters:  04/12/24 224 lb (101.6 kg)  01/10/24 213 lb (96.6 kg)  12/28/23 213 lb (96.6 kg)    Physical Exam Constitutional:      Appearance: Normal appearance.  Pulmonary:     Effort: Pulmonary effort is normal.  Neurological:     Mental Status: She is alert. Mental status is at baseline.  Psychiatric:        Mood and Affect: Mood normal.     Labs reviewed: Basic Metabolic Panel: Recent Labs    01/10/24 1417 04/12/24 1501  NA 139 141  K 3.9 4.3  CL 103 103  CO2 28 28  GLUCOSE 121 127*  BUN 22 24  CREATININE 0.65 0.79  CALCIUM 9.1 9.2   Liver Function Tests: Recent Labs    01/10/24 1417 04/12/24 1501  AST 14 18  ALT 16 17  BILITOT 0.4 0.4  PROT 6.6 7.2   No results for input(s): LIPASE, AMYLASE in the last 8760 hours. No results for input(s): AMMONIA in the last 8760 hours. CBC: Recent Labs    01/10/24 1417 04/12/24 1501  WBC 6.6 6.4  NEUTROABS 4,013 3,974  HGB  13.8 12.4  HCT 40.1 37.2  MCV 93.7 98.9  PLT 296 355   Lipid Panel: Recent Labs    01/10/24 1417  CHOL 211*  HDL 56  LDLCALC 125*  TRIG 186*  CHOLHDL 3.8   TSH: No results for input(s): TSH in the last  8760 hours. A1C: Lab Results  Component Value Date   HGBA1C 6.2 (H) 04/12/2024     Assessment/Plan Assessment and Plan Assessment & Plan Status post left hip replacement on 03/20/2024 with pain in left hip and lower extremity due to unilateral primary osteoarthritis Post-operative pain managed with oxycodone  tapering, celecoxib, and Tylenol . Physical therapy ongoing. - Taper oxycodone  to 5 mg twice daily, for a few days then only take AS needed for pain and to use sparingly, she is currently taking half tablet- unsure if she can quarter.  - Encourage Tylenol  use before oxycodone . - Continue celecoxib with caution. Take with food and once off oxycodone  would recommend reducing this to as needed as well  - continue PT  Restless legs syndrome Intermittent episodes managed with ropinirole  as needed. Frequency of symptoms decreased. - Use ropinirole  as needed. - Encourage leg stretches and hydration.  Lumbar spinal stenosis Chronic lower back pain- will restart Cymbalta  and physical therapy if needed - Start Cymbalta  20 mg daily in the morning.  Insomnia Improved sleep with trazodone , well-tolerated and effective. - Continue trazodone  50 mg at bedtime.  Anxiety symptoms Anxiety managed with Cymbalta  and trazodone .  - Start Cymbalta  20 mg daily in the morning. - Continue trazodone  50 mg at bedtime.  Follow-Up Follow-up needed to monitor medication adjustments and progress. Minimize medications to reduce fall risk. - Schedule follow-up in three months. - Encourage daughter to contact office with concerns or to schedule appointments.    Next appt: 3 months.  Sameka Bagent K. Brenda Mcfarland  [facility] [phone]    Virtual Visit via video  I connected with patient on 05/04/24 at 10:20 AM EDT by mychart and verified that I am speaking with the correct person using two identifiers.  Location: Patient: home Provider: clinic    I discussed the  limitations, risks, security and privacy concerns of performing an evaluation and management service by telephone and the availability of in person appointments. I also discussed with the patient that there may be a patient responsible charge related to this service. The patient expressed understanding and agreed to proceed.   I discussed the assessment and treatment plan with the patient. The patient was provided an opportunity to ask questions and all were answered. The patient agreed with the plan and demonstrated an understanding of the instructions.   The patient was advised to call back or seek an in-person evaluation if the symptoms worsen or if the condition fails to improve as anticipated.  I provided 25 minutes of non-face-to-face time during this encounter.  Brenda Mcfarland Avs printed and mailed

## 2024-05-13 ENCOUNTER — Encounter: Payer: Self-pay | Admitting: Nurse Practitioner

## 2024-05-15 NOTE — Telephone Encounter (Signed)
 Celebrex pended for review and methocarbamol  is not on active medication list, sending to [name], NP

## 2024-05-15 NOTE — Telephone Encounter (Signed)
 She will need to get these refilled through her surgeons office, also recommended to come off of the celebrex due to the effects on kidney, heart and GI tract

## 2024-05-22 DIAGNOSIS — L821 Other seborrheic keratosis: Secondary | ICD-10-CM | POA: Diagnosis not present

## 2024-05-22 DIAGNOSIS — L720 Epidermal cyst: Secondary | ICD-10-CM | POA: Diagnosis not present

## 2024-05-23 DIAGNOSIS — Z96642 Presence of left artificial hip joint: Secondary | ICD-10-CM | POA: Diagnosis not present

## 2024-05-23 DIAGNOSIS — Z5181 Encounter for therapeutic drug level monitoring: Secondary | ICD-10-CM | POA: Diagnosis not present

## 2024-05-23 DIAGNOSIS — Z79891 Long term (current) use of opiate analgesic: Secondary | ICD-10-CM | POA: Diagnosis not present

## 2024-05-23 DIAGNOSIS — G894 Chronic pain syndrome: Secondary | ICD-10-CM | POA: Diagnosis not present

## 2024-05-23 NOTE — Telephone Encounter (Signed)
 Message routed to PCP Caro, Harlene POUR, NP

## 2024-05-29 DIAGNOSIS — G4733 Obstructive sleep apnea (adult) (pediatric): Secondary | ICD-10-CM | POA: Diagnosis not present

## 2024-05-30 ENCOUNTER — Other Ambulatory Visit: Payer: Self-pay | Admitting: Nurse Practitioner

## 2024-05-30 DIAGNOSIS — G4733 Obstructive sleep apnea (adult) (pediatric): Secondary | ICD-10-CM | POA: Diagnosis not present

## 2024-05-30 DIAGNOSIS — G2581 Restless legs syndrome: Secondary | ICD-10-CM

## 2024-05-30 NOTE — Telephone Encounter (Signed)
 Pharmacy requested refill.  Pended Rx and sent to [facility] for approval.

## 2024-06-07 ENCOUNTER — Telehealth: Payer: Self-pay

## 2024-06-07 NOTE — Telephone Encounter (Signed)
 I called Elenor back from [facility] for PT verbal orders. Nothing else follows at this time.

## 2024-06-07 NOTE — Telephone Encounter (Signed)
 Copied from CRM #[id number]. Topic: Clinical - Home Health Verbal Orders >> Jun 07, 2024  4:16 PM Merlynn LABOR wrote: Caller[facility] Callback Number: [phone] - Secured Line Service Requested: Physical Therapy Frequency: 1 week 6 - beginning 06/12/24 Any new concerns about the patient? No

## 2024-06-08 NOTE — Telephone Encounter (Signed)
 Verbal orders given

## 2024-06-08 NOTE — Telephone Encounter (Signed)
 Copied from CRM [id number]. Topic: Clinical - Home Health Verbal Orders >> Jun 07, 2024  4:50 PM Debby BROCKS wrote: Caller/Agency: [facility]  Callback Number: [phone] Service Requested: Occupational Therapy Frequency: 1 week 4 starting on Monday Oct 13th Any new concerns about the patient? No, just continuing to work on her posture and strengthening

## 2024-06-16 DIAGNOSIS — I10 Essential (primary) hypertension: Secondary | ICD-10-CM | POA: Diagnosis not present

## 2024-06-16 DIAGNOSIS — E119 Type 2 diabetes mellitus without complications: Secondary | ICD-10-CM | POA: Diagnosis not present

## 2024-06-16 DIAGNOSIS — Z471 Aftercare following joint replacement surgery: Secondary | ICD-10-CM | POA: Diagnosis not present

## 2024-06-16 DIAGNOSIS — F324 Major depressive disorder, single episode, in partial remission: Secondary | ICD-10-CM | POA: Diagnosis not present

## 2024-06-16 DIAGNOSIS — I2510 Atherosclerotic heart disease of native coronary artery without angina pectoris: Secondary | ICD-10-CM | POA: Diagnosis not present

## 2024-06-16 DIAGNOSIS — M48061 Spinal stenosis, lumbar region without neurogenic claudication: Secondary | ICD-10-CM | POA: Diagnosis not present

## 2024-06-16 DIAGNOSIS — G894 Chronic pain syndrome: Secondary | ICD-10-CM | POA: Diagnosis not present

## 2024-06-16 DIAGNOSIS — M4712 Other spondylosis with myelopathy, cervical region: Secondary | ICD-10-CM | POA: Diagnosis not present

## 2024-06-16 DIAGNOSIS — H409 Unspecified glaucoma: Secondary | ICD-10-CM | POA: Diagnosis not present

## 2024-06-16 DIAGNOSIS — Z96642 Presence of left artificial hip joint: Secondary | ICD-10-CM | POA: Diagnosis not present

## 2024-06-16 DIAGNOSIS — G4733 Obstructive sleep apnea (adult) (pediatric): Secondary | ICD-10-CM | POA: Diagnosis not present

## 2024-06-16 DIAGNOSIS — G4709 Other insomnia: Secondary | ICD-10-CM | POA: Diagnosis not present

## 2024-06-20 ENCOUNTER — Telehealth: Payer: Self-pay

## 2024-06-20 NOTE — Telephone Encounter (Signed)
 Copied from CRM [id number]. Topic: General - Billing Inquiry >> Jun 20, 2024  3:10 PM Diannia H wrote: Reason for CRM: Patient called to ask the office to apply her insurance to her bill she received for her lab work in August on the 13th. She did receive a bill for the service. Could you assist? Callback number for the patient is [phone].

## 2024-06-20 NOTE — Telephone Encounter (Signed)
 Returned call to patient to advise her that she would need to contact [facility] and provide them with her insurance information

## 2024-07-06 ENCOUNTER — Other Ambulatory Visit: Payer: Self-pay | Admitting: Nurse Practitioner

## 2024-07-06 DIAGNOSIS — G2581 Restless legs syndrome: Secondary | ICD-10-CM

## 2024-07-12 DIAGNOSIS — G4733 Obstructive sleep apnea (adult) (pediatric): Secondary | ICD-10-CM | POA: Diagnosis not present

## 2024-07-16 DIAGNOSIS — K819 Cholecystitis, unspecified: Secondary | ICD-10-CM | POA: Diagnosis not present

## 2024-07-16 DIAGNOSIS — K760 Fatty (change of) liver, not elsewhere classified: Secondary | ICD-10-CM | POA: Diagnosis not present

## 2024-07-16 DIAGNOSIS — Z7982 Long term (current) use of aspirin: Secondary | ICD-10-CM | POA: Diagnosis not present

## 2024-07-16 DIAGNOSIS — E119 Type 2 diabetes mellitus without complications: Secondary | ICD-10-CM | POA: Diagnosis not present

## 2024-07-16 DIAGNOSIS — F32A Depression, unspecified: Secondary | ICD-10-CM | POA: Diagnosis not present

## 2024-07-16 DIAGNOSIS — R9431 Abnormal electrocardiogram [ECG] [EKG]: Secondary | ICD-10-CM | POA: Diagnosis not present

## 2024-07-16 DIAGNOSIS — Z9089 Acquired absence of other organs: Secondary | ICD-10-CM | POA: Diagnosis not present

## 2024-07-16 DIAGNOSIS — R519 Headache, unspecified: Secondary | ICD-10-CM | POA: Diagnosis not present

## 2024-07-16 DIAGNOSIS — K8020 Calculus of gallbladder without cholecystitis without obstruction: Secondary | ICD-10-CM | POA: Diagnosis not present

## 2024-07-16 DIAGNOSIS — Z79899 Other long term (current) drug therapy: Secondary | ICD-10-CM | POA: Diagnosis not present

## 2024-07-16 DIAGNOSIS — K8010 Calculus of gallbladder with chronic cholecystitis without obstruction: Secondary | ICD-10-CM | POA: Diagnosis not present

## 2024-07-16 DIAGNOSIS — G4733 Obstructive sleep apnea (adult) (pediatric): Secondary | ICD-10-CM | POA: Diagnosis not present

## 2024-07-16 DIAGNOSIS — Z885 Allergy status to narcotic agent status: Secondary | ICD-10-CM | POA: Diagnosis not present

## 2024-07-16 DIAGNOSIS — Z888 Allergy status to other drugs, medicaments and biological substances status: Secondary | ICD-10-CM | POA: Diagnosis not present

## 2024-07-16 DIAGNOSIS — K810 Acute cholecystitis: Secondary | ICD-10-CM | POA: Diagnosis not present

## 2024-07-16 DIAGNOSIS — Z6830 Body mass index (BMI) 30.0-30.9, adult: Secondary | ICD-10-CM | POA: Diagnosis not present

## 2024-07-16 DIAGNOSIS — G4700 Insomnia, unspecified: Secondary | ICD-10-CM | POA: Diagnosis not present

## 2024-07-16 DIAGNOSIS — K828 Other specified diseases of gallbladder: Secondary | ICD-10-CM | POA: Diagnosis not present

## 2024-07-16 DIAGNOSIS — E669 Obesity, unspecified: Secondary | ICD-10-CM | POA: Diagnosis not present

## 2024-07-16 DIAGNOSIS — I10 Essential (primary) hypertension: Secondary | ICD-10-CM | POA: Diagnosis not present

## 2024-07-17 ENCOUNTER — Encounter: Payer: Self-pay | Admitting: Nurse Practitioner

## 2024-07-17 HISTORY — PX: OTHER SURGICAL HISTORY: SHX169

## 2024-07-19 ENCOUNTER — Telehealth: Payer: Self-pay

## 2024-07-19 NOTE — Transitions of Care (Post Inpatient/ED Visit) (Signed)
   07/19/2024  Name: Brenda Mcfarland MRN: [id number] DOB: 31-Dec-1945  Today's TOC FU Call Status: Today's TOC FU Call Status:: Unsuccessful Call (1st Attempt) Unsuccessful Call (1st Attempt) Date: 07/19/24  Attempted to reach the patient regarding the most recent Inpatient/ED visit.  Follow Up Plan: Additional outreach attempts will be made to reach the patient to complete the Transitions of Care (Post Inpatient/ED visit) call.   Shona Prow RN, CCM [place]  [facility]BC[facility]-Population Health RN Care Manager [phone]

## 2024-07-20 ENCOUNTER — Telehealth: Payer: Self-pay

## 2024-07-20 NOTE — Transitions of Care (Post Inpatient/ED Visit) (Signed)
   07/20/2024  Name: Brenda Mcfarland MRN: [id number] DOB: Nov 09, 1945  Today's TOC FU Call Status: Today's TOC FU Call Status:: Unsuccessful Call (2nd Attempt) Unsuccessful Call (2nd Attempt) Date: 07/20/24  Attempted to reach the patient regarding the most recent Inpatient/ED visit.  Follow Up Plan: Additional outreach attempts will be made to reach the patient to complete the Transitions of Care (Post Inpatient/ED visit) call.   Shona Prow RN, CCM [place]  VBCI-Population Health RN Care Manager [phone]

## 2024-07-21 ENCOUNTER — Telehealth: Payer: Self-pay

## 2024-07-21 NOTE — Telephone Encounter (Signed)
 Copied from CRM [id number]. Topic: General - Other >> Jul 21, 2024 11:37 AM Mercer PEDLAR wrote: Reason for CRM: Doyal calling from [facility] stating that PT and OT services were not ordered to resume after hospital discharge. She is requesting order to resume services.  Callback: [phone] Fax: [phone]

## 2024-07-21 NOTE — Telephone Encounter (Signed)
 Call returned to [facility], patient is the one that requested to resume. Verbal order given. TOC follow-up is being handle by [facility] care team.

## 2024-07-21 NOTE — Transitions of Care (Post Inpatient/ED Visit) (Signed)
   07/21/2024  Name: Brenda Mcfarland MRN: [id number] DOB: [birth year]  Today's TOC FU Call Status: Today's TOC FU Call Status:: Unsuccessful Call (3rd Attempt) Unsuccessful Call (3rd Attempt) Date: 07/21/24  Attempted to reach the patient regarding the most recent Inpatient/ED visit.  Follow Up Plan: No further outreach attempts will be made at this time. We have been unable to contact the patient.  Shona Prow RN, CCM   VBCI-Population Health RN Care Manager [phone]

## 2024-07-21 NOTE — Telephone Encounter (Signed)
 Please advise if ok to give order to resume

## 2024-07-21 NOTE — Telephone Encounter (Signed)
 We have not done a TOC visit, they can reach out to patient to see if she is able to continue therapy- would recommend follow up

## 2024-07-24 ENCOUNTER — Encounter: Payer: Self-pay | Admitting: Nurse Practitioner

## 2024-07-25 ENCOUNTER — Ambulatory Visit: Payer: Self-pay

## 2024-07-25 NOTE — Telephone Encounter (Signed)
 FYI Only or Action Required?: FYI only for provider: ED advised.  Patient was last seen in primary care on 05/04/2024 by [name], NP.  Called Nurse Triage reporting Fatigue.  Symptoms began a week ago.  Interventions attempted: Nothing.  Symptoms are: unchanged.  Triage Disposition: Go to ED Now (or PCP Triage)  Patient/caregiver understands and will follow disposition?: Yes   Copied from CRM #[id number]. Topic: Clinical - Red Word Triage >> Jul 25, 2024  8:38 AM Brenda Mcfarland wrote: Red Word that prompted transfer to Nurse Triage: Follow up from Surgery on 11/17 Emergency  Gall bladder-PT is having issues with diarrhea/Tired/Fatigue/dehydration Reason for Disposition  [1] Drinking very little AND [2] dehydration suspected (e.g., no urine > 12 hours, very dry mouth, very lightheaded)  Answer Assessment - Initial Assessment Questions Advised ED now.  Pt's daughter reports will take to [facility] ED.  1. DESCRIPTION: Describe how you are feeling.     Fatigue since surgery, diarrhea for week Pt's daughter unsure how many stools within 24 hours; pt reports constant 2. SEVERITY: How bad is it?  Can you stand and walk?     Yes HA, dizziness, 3. ONSET: When did these symptoms begin? (e.g., hours, days, weeks, months)     07/17/24 4. CAUSE: What do you think is causing the weakness or fatigue? (e.g., not drinking enough fluids, medical problem, trouble sleeping)     Possible dehydration 5. NEW MEDICINES:  Have you started on any new medicines recently? (e.g., opioid pain medicines, benzodiazepines, muscle relaxants, antidepressants, antihistamines, neuroleptics, beta blockers)     Pain med 6. OTHER SYMPTOMS: Do you have any other symptoms? (e.g., chest pain, fever, cough, SOB, vomiting, diarrhea, bleeding, other areas of pain) Denies chest pain, sob, fever, chills, n/v  Protocols used: Weakness (Generalized) and Fatigue-A-AH

## 2024-07-25 NOTE — Telephone Encounter (Signed)
 Noted, will need to see her for follow up in the office next week if able

## 2024-08-03 ENCOUNTER — Ambulatory Visit: Payer: Self-pay | Admitting: Orthopedic Surgery

## 2024-08-03 ENCOUNTER — Encounter: Payer: Self-pay | Admitting: Orthopedic Surgery

## 2024-08-03 VITALS — BP 136/76 | HR 105 | Temp 98.4°F | Ht 67.0 in | Wt 220.8 lb

## 2024-08-03 DIAGNOSIS — Z9049 Acquired absence of other specified parts of digestive tract: Secondary | ICD-10-CM

## 2024-08-03 DIAGNOSIS — M48061 Spinal stenosis, lumbar region without neurogenic claudication: Secondary | ICD-10-CM

## 2024-08-03 DIAGNOSIS — R600 Localized edema: Secondary | ICD-10-CM | POA: Diagnosis not present

## 2024-08-03 DIAGNOSIS — F419 Anxiety disorder, unspecified: Secondary | ICD-10-CM | POA: Diagnosis not present

## 2024-08-03 NOTE — Progress Notes (Signed)
 Careteam: Patient Care Team: [name], NP as PCP - General (Geriatric Medicine) Tobb, Kardie, DO as PCP - Cardiology (Cardiology) Evern Donnajean Kayser, MD as Referring Physician (Orthopedic Surgery) Bernie Lamar PARAS, MD as Consulting Physician (Cardiology)  Seen by: Greig Cluster, AGNP-C  PLACE OF SERVICE:  [facility]  Advanced Directive information    Allergies  Allergen Reactions   Amlodipine  Swelling   Aripiprazole Other (See Comments)    Tongue twisted, lips curled up Tongue twisted, lips curled up    Carvedilol Other (See Comments)    Nightmares Nightmares    Hydralazine      Diarrhea, abdominal pain   Irbesartan      dizziness   Meclizine  Other (See Comments)    Dizziness    Paroxetine Hcl Other (See Comments)    . SABRA    Tizanidine Other (See Comments)    Syncope Syncope    Tramadol Other (See Comments)    Unknown   Zolpidem Other (See Comments)    Sleepwalking Sleepwalking    Zyrtec [Cetirizine Hcl]     Dizziness    Chief Complaint  Patient presents with   Follow-up     HPI: Patient is a 78 y.o. female seen today for f/u s/p hospitalization at [facility] 11/16-11/18 due to cholecystitis.   Discussed the use of AI scribe software for clinical note transcription with the patient, who gave verbal consent to proceed.  History of Present Illness   Brenda Mcfarland is a 78 year old female who presents for a hospital follow-up after gallbladder surgery.  She was hospitalized on November 16th at [facility] due to severe abdominal pain. With a history of pancreatitis, she was concerned about experiencing another one. An ultrasound revealed gallbladder sludge and stones, leading to a diagnosis of cholecystitis. She underwent a lap cholecystectomy during hospitalization.   Post-surgery, she has experienced mild stomach discomfort and has three or four surgical sites. One incision near her umbilicus is described as 'bumpy'. No  fever, pus, redness, or swelling at surgical sites. Reports normal bowel movements and no issues with the seatbelt while driving. She is able to shower, and the surgical glue has dissolved. Follow up with general surgery was not recommended.   Her post-surgery diet includes eggs, grilled ham and cheese sandwiches, and chicken with mixed vegetables. Initially, she experienced diarrhea but now has normal bowel movements. She is cautious with her diet and trying to follow low fat options.   She has been off narcotic pain medication. She mentions increased activity, including walking frequently and a trip to [place], which she feels is a significant improvement.  She was started on torsemide  for lower leg edema and has reduced her dose from two tablets to one per day. Lower leg swelling has improved today.   She is also on duloxetine  for chronic back pain and anxiety.  Asking to discontinue Celebrex due to improved overall pain.   Declined follow up lab work today. Afebrile. Vitals stable.   Review of Systems:  Review of Systems  Constitutional: Negative.   HENT: Negative.    Eyes: Negative.   Respiratory: Negative.    Cardiovascular: Negative.   Gastrointestinal:  Positive for diarrhea. Negative for abdominal pain, blood in stool, constipation, nausea and vomiting.  Genitourinary: Negative.   Musculoskeletal:  Positive for back pain. Negative for falls.  Skin: Negative.   Neurological: Negative.   Psychiatric/Behavioral:  Negative for depression. The patient is nervous/anxious.     Past Medical History:  Diagnosis Date   Abnormal EKG 10/23/2016   Arthritis 01/18/2020   Chronic fatigue 10/08/2015   Depression 01/18/2020   Diabetes mellitus (HCC) 10/08/2015   Formatting of this note might be different from the original. Overview:  last Hgb A1C 7.2/no blood sugars at home Formatting of this note might be different from the original. last Hgb A1C 7.2/no blood sugars at home   ESR raised  11/27/2016   Frequent urination 12/16/2018   Glaucoma 10/08/2015   High cholesterol    History of pancreatitis 07/11/2014   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/23/2016   Hypertension 10/08/2015   IFG (impaired fasting glucose) 10/24/2019   Incomplete emptying of bladder 11/30/2017   Knee pain, left 12/22/2016   Loss of memory 01/18/2020   Major depressive disorder in partial remission 10/08/2015   Morbid obesity (HCC) 10/23/2016   Obesity (BMI 30-39.9) 11/10/2016   Obstructive sleep apnea 12/16/2018   Osteoarthritis of both knees 11/27/2016   Other insomnia 03/05/2017   Palpitations 10/23/2016   Positive ANA (antinuclear antibody) 10/23/2016   Sleep apnea 10/08/2015   Formatting of this note might be different from the original. Overview:  does not use CPAP Formatting of this note might be different from the original. does not use CPAP   Sore neck 10/24/2019   Spondylosis of cervical spine with myelopathy 01/18/2020   Type 2 diabetes mellitus without complication, without long-term current use of insulin  (HCC) 12/16/2018   Vitamin D  deficiency 10/08/2015   Past Surgical History:  Procedure Laterality Date   APPENDECTOMY     age 22   CESAREAN SECTION     X 3   gallbladder removed  07/17/2024   NECK SURGERY  2015   OTHER SURGICAL HISTORY  08/16/2023   Spinal injection   SPINE SURGERY  05/29/2015   TOTAL HIP ARTHROPLASTY  03/20/2024   Social History:   reports that she quit smoking about 52 years ago. Her smoking use included cigarettes. She started smoking about 61 years ago. She has a 9 pack-year smoking history. She has never used smokeless tobacco. She reports that she does not currently use alcohol. She reports that she does not currently use drugs.  Family History  Problem Relation Age of Onset   Diabetes Mother    Multiple sclerosis Mother    Heart disease Father    Heart Problems Father    Migraines Daughter    Thyroid  disease Daughter    Migraines Daughter     Medications: Patient's Medications   New Prescriptions   No medications on file  Previous Medications   ACETAMINOPHEN  (TYLENOL ) 500 MG TABLET    Take 500 mg by mouth as needed.   ACETAMINOPHEN  (TYLENOL ) 650 MG CR TABLET    Take 650 mg by mouth every 8 (eight) hours as needed for pain.   ALPRAZOLAM (XANAX) 0.25 MG TABLET    Take 0.25 mg by mouth as needed.   AMLODIPINE  (NORVASC ) 5 MG TABLET    Take 1 tablet (5 mg total) by mouth daily.   CELEBREX 100 MG CAPSULE    Take 100 mg by mouth 2 (two) times daily.   DULOXETINE  (CYMBALTA ) 20 MG CAPSULE    Take 1 capsule (20 mg total) by mouth daily.   LATANOPROST (XALATAN) 0.005 % OPHTHALMIC SOLUTION    Place 1 drop into both eyes at bedtime.   LOSARTAN  (COZAAR ) 50 MG TABLET    TAKE ONE TABLET BY MOUTH ONE TIME DAILY   MAGNESIUM GLYCINATE PO    Take 350  mg by mouth.   MELATONIN 5 MG TABS    Take by mouth at bedtime. 1-2 tablets   OVER THE COUNTER MEDICATION    daily. Essential Oils as needed   POLYETHYLENE GLYCOL (MIRALAX / GLYCOLAX) 17 G PACKET    Take 17 g by mouth as needed.   PROBIOTIC PRODUCT (PROBIOTIC DAILY PO)    Take by mouth daily.   ROPINIROLE  (REQUIP ) 0.5 MG TABLET    TAKE 1 TABLET (0.5 MG TOTAL) BY MOUTH AT BEDTIME AS NEEDED.   TRAZODONE  (DESYREL ) 50 MG TABLET    Take 1 tablet (50 mg total) by mouth at bedtime as needed. for sleep  Modified Medications   No medications on file  Discontinued Medications   CONTINUOUS GLUCOSE RECEIVER (FREESTYLE LIBRE 3 READER) DEVI    1 Device by Does not apply route daily. E11.69   CONTINUOUS GLUCOSE SENSOR (FREESTYLE LIBRE 3 SENSOR) MISC    1 Device by Does not apply route every 14 (fourteen) days. Place 1 sensor on the skin every 14 days. Use to check glucose continuously E11.69   ONDANSETRON  (ZOFRAN ) 4 MG TABLET    Take 4 mg by mouth every 8 (eight) hours as needed for nausea or vomiting.    Physical Exam:  Vitals:   08/03/24 1304  BP: 136/76  Pulse: (!) 105  Temp: 98.4 F (36.9 C)  SpO2: 94%  Weight: 220 lb 12.8 oz (100.2 kg)   Height: 5' 7 (1.702 m)   Body mass index is 34.58 kg/m. Wt Readings from Last 3 Encounters:  08/03/24 220 lb 12.8 oz (100.2 kg)  04/12/24 224 lb (101.6 kg)  01/10/24 213 lb (96.6 kg)    Physical Exam Vitals reviewed.  Constitutional:      General: She is not in acute distress. HENT:     Head: Normocephalic.  Eyes:     General:        Right eye: No discharge.        Left eye: No discharge.  Cardiovascular:     Rate and Rhythm: Normal rate and regular rhythm.     Pulses: Normal pulses.     Heart sounds: Normal heart sounds.  Pulmonary:     Effort: Pulmonary effort is normal. No respiratory distress.     Breath sounds: Normal breath sounds. No wheezing or rales.  Abdominal:     General: Bowel sounds are normal. There is no distension.     Tenderness: There is no abdominal tenderness. There is no guarding.  Musculoskeletal:     Cervical back: Neck supple.     Right lower leg: Edema present.     Left lower leg: Edema present.     Comments: Non pitting  Skin:    General: Skin is warm.     Capillary Refill: Capillary refill takes less than 2 seconds.     Comments: 3 surgical lap sites closed, no sign of infection  Neurological:     General: No focal deficit present.     Mental Status: She is alert and oriented to person, place, and time.     Gait: Gait abnormal.  Psychiatric:        Mood and Affect: Mood normal.     Labs reviewed: Basic Metabolic Panel: Recent Labs    01/10/24 1417 04/12/24 1501  NA 139 141  K 3.9 4.3  CL 103 103  CO2 28 28  GLUCOSE 121 127*  BUN 22 24  CREATININE 0.65 0.79  CALCIUM 9.1 9.2  Liver Function Tests: Recent Labs    01/10/24 1417 04/12/24 1501  AST 14 18  ALT 16 17  BILITOT 0.4 0.4  PROT 6.6 7.2   No results for input(s): LIPASE, AMYLASE in the last 8760 hours. No results for input(s): AMMONIA in the last 8760 hours. CBC: Recent Labs    01/10/24 1417 04/12/24 1501  WBC 6.6 6.4  NEUTROABS 4,013 3,974  HGB  13.8 12.4  HCT 40.1 37.2  MCV 93.7 98.9  PLT 296 355   Lipid Panel: Recent Labs    01/10/24 1417  CHOL 211*  HDL 56  LDLCALC 125*  TRIG 186*  CHOLHDL 3.8   TSH: No results for input(s): TSH in the last 8760 hours. A1C: Lab Results  Component Value Date   HGBA1C 6.2 (H) 04/12/2024     Assessment/Plan 1. S/P laparoscopic cholecystectomy (Primary) - hospitalized 11/16-11/18 - RUQ U/S confirmed cholecystitis - underwent lap chole> tolerated procedure well  - denies pain today> off narcotics - exam unremarkable  - having normal BM, intermittent diarrhea - discussed low fat diet  - may start driving  - declined follow up cbc/diff, cmp today  2. Lower leg edema - prescribed during hospitalization per patient  - non pitting edema today - change torsemide  to daily prn for swelling - discussed low sodium diet and increased mobility to help reduce swelling   3. Spinal stenosis of lumbar region without neurogenic claudication - denies increased pain - will stop Celebrex - cont Cymbalta    4. Anxiety - no recent panic - cont Cymbalta  and xanax prn    Total time: 33 minutes. Greater than 50% of total time spent doing patient education regarding lap chole, low fat diet, lower leg swelling and chronic back pain including symptom/medication management.     Next appt: Visit date not found  Brenda Mcfarland BODILY  [facility] [phone]

## 2024-08-03 NOTE — Patient Instructions (Addendum)
 Just use torsemide  as needed for leg swelling   Focus on low fat diet  Stop celebrex  Continue Cymbalta  for chronic back pain   Merry Christmas and Happy New Year!!!!

## 2024-08-08 ENCOUNTER — Telehealth: Payer: Self-pay

## 2024-08-08 NOTE — Telephone Encounter (Signed)
 Copied from CRM [id number]. Topic: General - Other >> Aug 07, 2024  3:59 PM Diannia H wrote: Reason for CRM: Radovan the PT called and stated the patient completed the OT and there is no need foe further OT at this moment [phone] Radovan

## 2024-08-08 NOTE — Telephone Encounter (Signed)
 Noted thank you

## 2024-08-10 DIAGNOSIS — Z9049 Acquired absence of other specified parts of digestive tract: Secondary | ICD-10-CM | POA: Diagnosis not present

## 2024-08-10 DIAGNOSIS — Z1330 Encounter for screening examination for mental health and behavioral disorders, unspecified: Secondary | ICD-10-CM | POA: Diagnosis not present

## 2024-08-25 ENCOUNTER — Encounter: Payer: Self-pay | Admitting: Nurse Practitioner

## 2024-09-04 ENCOUNTER — Encounter: Payer: Self-pay | Admitting: Nurse Practitioner

## 2024-09-06 ENCOUNTER — Encounter: Payer: Self-pay | Admitting: Nurse Practitioner

## 2024-09-07 ENCOUNTER — Other Ambulatory Visit: Payer: Self-pay | Admitting: Nurse Practitioner

## 2024-09-07 DIAGNOSIS — I10 Essential (primary) hypertension: Secondary | ICD-10-CM

## 2024-09-26 ENCOUNTER — Encounter: Payer: Self-pay | Admitting: Nurse Practitioner

## 2024-09-29 ENCOUNTER — Telehealth: Payer: Self-pay | Admitting: Nurse Practitioner

## 2024-10-04 ENCOUNTER — Other Ambulatory Visit: Payer: Self-pay | Admitting: Family

## 2024-10-04 DIAGNOSIS — R600 Localized edema: Secondary | ICD-10-CM

## 2024-10-04 NOTE — Telephone Encounter (Signed)
 Not showing prescribed by you. Please advise

## 2024-10-06 ENCOUNTER — Encounter: Payer: Self-pay | Admitting: Nurse Practitioner

## 2024-10-09 ENCOUNTER — Ambulatory Visit: Admitting: Nurse Practitioner

## 2025-01-04 ENCOUNTER — Ambulatory Visit: Payer: Self-pay | Admitting: Nurse Practitioner
